# Patient Record
Sex: Female | Born: 1940
Health system: Southern US, Community
[De-identification: ages and names within clinical notes are randomized; demographics above are authoritative.]

## PROBLEM LIST (undated history)

## (undated) ENCOUNTER — Emergency Department (HOSPITAL_COMMUNITY): Admission: EM | Payer: Medicare PPO

## (undated) DIAGNOSIS — H9190 Unspecified hearing loss, unspecified ear: Secondary | ICD-10-CM

## (undated) DIAGNOSIS — E785 Hyperlipidemia, unspecified: Secondary | ICD-10-CM

## (undated) DIAGNOSIS — I10 Essential (primary) hypertension: Secondary | ICD-10-CM

## (undated) DIAGNOSIS — R011 Cardiac murmur, unspecified: Secondary | ICD-10-CM

## (undated) DIAGNOSIS — H269 Unspecified cataract: Secondary | ICD-10-CM

## (undated) DIAGNOSIS — I480 Paroxysmal atrial fibrillation: Secondary | ICD-10-CM

## (undated) DIAGNOSIS — F419 Anxiety disorder, unspecified: Secondary | ICD-10-CM

## (undated) DIAGNOSIS — I499 Cardiac arrhythmia, unspecified: Secondary | ICD-10-CM

## (undated) DIAGNOSIS — G4730 Sleep apnea, unspecified: Secondary | ICD-10-CM

## (undated) DIAGNOSIS — M1990 Unspecified osteoarthritis, unspecified site: Secondary | ICD-10-CM

## (undated) DIAGNOSIS — I509 Heart failure, unspecified: Secondary | ICD-10-CM

## (undated) HISTORY — DX: Hyperlipidemia, unspecified: E78.5

## (undated) HISTORY — DX: Essential (primary) hypertension: I10

## (undated) HISTORY — PX: BREAST SURGERY: SHX581

## (undated) HISTORY — DX: Heart failure, unspecified: I50.9

## (undated) HISTORY — PX: DILATION AND CURETTAGE OF UTERUS: SHX78

## (undated) HISTORY — DX: Unspecified cataract: H26.9

## (undated) HISTORY — PX: TUBAL LIGATION: SHX77

## (undated) HISTORY — PX: ABDOMINAL HYSTERECTOMY: SHX81

## (undated) HISTORY — PX: NECK SURGERY: SHX720

## (undated) HISTORY — DX: Sleep apnea, unspecified: G47.30

## (undated) HISTORY — PX: TONSILLECTOMY: SUR1361

## (undated) HISTORY — PX: SPINE SURGERY: SHX786

## (undated) HISTORY — PX: BREAST CYST EXCISION: SHX579

---

## 1998-07-28 ENCOUNTER — Encounter: Payer: Self-pay | Admitting: Obstetrics and Gynecology

## 1998-08-08 ENCOUNTER — Inpatient Hospital Stay (HOSPITAL_COMMUNITY): Admission: RE | Admit: 1998-08-08 | Discharge: 1998-08-10 | Payer: Self-pay | Admitting: Obstetrics and Gynecology

## 1999-09-14 ENCOUNTER — Encounter: Payer: Self-pay | Admitting: Neurosurgery

## 1999-09-14 ENCOUNTER — Ambulatory Visit (HOSPITAL_COMMUNITY): Admission: RE | Admit: 1999-09-14 | Discharge: 1999-09-14 | Payer: Self-pay | Admitting: Neurosurgery

## 1999-10-24 ENCOUNTER — Encounter: Payer: Self-pay | Admitting: Neurosurgery

## 1999-10-25 ENCOUNTER — Encounter: Payer: Self-pay | Admitting: Neurosurgery

## 1999-10-25 ENCOUNTER — Ambulatory Visit (HOSPITAL_COMMUNITY): Admission: RE | Admit: 1999-10-25 | Discharge: 1999-10-25 | Payer: Self-pay | Admitting: Neurosurgery

## 1999-12-17 ENCOUNTER — Ambulatory Visit (HOSPITAL_COMMUNITY): Admission: RE | Admit: 1999-12-17 | Discharge: 1999-12-17 | Payer: Self-pay | Admitting: Neurosurgery

## 1999-12-17 ENCOUNTER — Encounter: Payer: Self-pay | Admitting: Neurosurgery

## 1999-12-24 ENCOUNTER — Encounter: Payer: Self-pay | Admitting: Gastroenterology

## 1999-12-24 ENCOUNTER — Encounter: Admission: RE | Admit: 1999-12-24 | Discharge: 1999-12-24 | Payer: Self-pay | Admitting: Gastroenterology

## 2000-12-25 ENCOUNTER — Encounter: Payer: Self-pay | Admitting: Obstetrics and Gynecology

## 2000-12-25 ENCOUNTER — Encounter: Admission: RE | Admit: 2000-12-25 | Discharge: 2000-12-25 | Payer: Self-pay | Admitting: Obstetrics and Gynecology

## 2002-01-05 ENCOUNTER — Encounter: Admission: RE | Admit: 2002-01-05 | Discharge: 2002-01-05 | Payer: Self-pay | Admitting: Obstetrics and Gynecology

## 2002-01-05 ENCOUNTER — Encounter: Payer: Self-pay | Admitting: Obstetrics and Gynecology

## 2002-01-07 ENCOUNTER — Encounter: Payer: Self-pay | Admitting: Obstetrics and Gynecology

## 2002-01-07 ENCOUNTER — Encounter: Admission: RE | Admit: 2002-01-07 | Discharge: 2002-01-07 | Payer: Self-pay | Admitting: Obstetrics and Gynecology

## 2003-01-11 ENCOUNTER — Encounter: Admission: RE | Admit: 2003-01-11 | Discharge: 2003-01-11 | Payer: Self-pay | Admitting: Obstetrics and Gynecology

## 2003-01-11 ENCOUNTER — Encounter: Payer: Self-pay | Admitting: Obstetrics and Gynecology

## 2004-01-30 ENCOUNTER — Encounter: Admission: RE | Admit: 2004-01-30 | Discharge: 2004-01-30 | Payer: Self-pay | Admitting: Internal Medicine

## 2005-02-06 ENCOUNTER — Encounter: Admission: RE | Admit: 2005-02-06 | Discharge: 2005-02-06 | Payer: Self-pay | Admitting: Internal Medicine

## 2005-03-29 ENCOUNTER — Encounter: Admission: RE | Admit: 2005-03-29 | Discharge: 2005-03-29 | Payer: Self-pay | Admitting: Internal Medicine

## 2005-12-31 ENCOUNTER — Encounter: Admission: RE | Admit: 2005-12-31 | Discharge: 2005-12-31 | Payer: Self-pay | Admitting: Neurosurgery

## 2006-02-20 ENCOUNTER — Encounter: Admission: RE | Admit: 2006-02-20 | Discharge: 2006-02-20 | Payer: Self-pay | Admitting: Internal Medicine

## 2007-02-24 ENCOUNTER — Encounter: Admission: RE | Admit: 2007-02-24 | Discharge: 2007-02-24 | Payer: Self-pay | Admitting: Internal Medicine

## 2008-03-18 ENCOUNTER — Encounter: Admission: RE | Admit: 2008-03-18 | Discharge: 2008-03-18 | Payer: Self-pay | Admitting: Internal Medicine

## 2009-03-21 ENCOUNTER — Encounter: Admission: RE | Admit: 2009-03-21 | Discharge: 2009-03-21 | Payer: Self-pay | Admitting: Internal Medicine

## 2010-03-22 ENCOUNTER — Encounter: Admission: RE | Admit: 2010-03-22 | Discharge: 2010-03-22 | Payer: Self-pay | Admitting: Internal Medicine

## 2011-04-10 ENCOUNTER — Other Ambulatory Visit: Payer: Self-pay | Admitting: Internal Medicine

## 2011-04-10 DIAGNOSIS — Z1231 Encounter for screening mammogram for malignant neoplasm of breast: Secondary | ICD-10-CM

## 2011-05-03 ENCOUNTER — Ambulatory Visit
Admission: RE | Admit: 2011-05-03 | Discharge: 2011-05-03 | Disposition: A | Discharge status: Home / Self Care | Payer: Medicare Other | Source: Ambulatory Visit | Attending: Internal Medicine | Admitting: Internal Medicine

## 2011-05-03 DIAGNOSIS — Z1231 Encounter for screening mammogram for malignant neoplasm of breast: Secondary | ICD-10-CM

## 2012-05-05 ENCOUNTER — Other Ambulatory Visit: Payer: Self-pay | Admitting: Internal Medicine

## 2012-05-05 DIAGNOSIS — Z1231 Encounter for screening mammogram for malignant neoplasm of breast: Secondary | ICD-10-CM

## 2012-05-29 ENCOUNTER — Ambulatory Visit
Admission: RE | Admit: 2012-05-29 | Discharge: 2012-05-29 | Disposition: A | Discharge status: Home / Self Care | Payer: Medicare Other | Source: Ambulatory Visit | Attending: Internal Medicine | Admitting: Internal Medicine

## 2012-05-29 DIAGNOSIS — Z1231 Encounter for screening mammogram for malignant neoplasm of breast: Secondary | ICD-10-CM

## 2012-06-04 ENCOUNTER — Other Ambulatory Visit: Payer: Self-pay | Admitting: Internal Medicine

## 2012-06-04 DIAGNOSIS — R928 Other abnormal and inconclusive findings on diagnostic imaging of breast: Secondary | ICD-10-CM

## 2012-06-05 ENCOUNTER — Ambulatory Visit
Admission: RE | Admit: 2012-06-05 | Discharge: 2012-06-05 | Disposition: A | Discharge status: Home / Self Care | Payer: Medicare Other | Source: Ambulatory Visit | Attending: Internal Medicine | Admitting: Internal Medicine

## 2012-06-05 ENCOUNTER — Other Ambulatory Visit: Payer: Self-pay | Admitting: Internal Medicine

## 2012-06-05 DIAGNOSIS — R928 Other abnormal and inconclusive findings on diagnostic imaging of breast: Secondary | ICD-10-CM

## 2012-06-09 ENCOUNTER — Ambulatory Visit
Admission: RE | Admit: 2012-06-09 | Discharge: 2012-06-09 | Disposition: A | Discharge status: Home / Self Care | Payer: Medicare Other | Source: Ambulatory Visit | Attending: Internal Medicine | Admitting: Internal Medicine

## 2012-06-09 DIAGNOSIS — R928 Other abnormal and inconclusive findings on diagnostic imaging of breast: Secondary | ICD-10-CM

## 2013-05-07 ENCOUNTER — Other Ambulatory Visit: Payer: Self-pay | Admitting: Internal Medicine

## 2013-05-07 DIAGNOSIS — N644 Mastodynia: Secondary | ICD-10-CM

## 2013-05-13 ENCOUNTER — Ambulatory Visit
Admission: RE | Admit: 2013-05-13 | Discharge: 2013-05-13 | Disposition: A | Discharge status: Home / Self Care | Payer: Medicare Other | Source: Ambulatory Visit | Attending: Internal Medicine | Admitting: Internal Medicine

## 2013-05-13 DIAGNOSIS — N644 Mastodynia: Secondary | ICD-10-CM

## 2014-06-02 ENCOUNTER — Other Ambulatory Visit: Payer: Self-pay

## 2014-06-02 DIAGNOSIS — Z1231 Encounter for screening mammogram for malignant neoplasm of breast: Secondary | ICD-10-CM

## 2014-06-15 ENCOUNTER — Ambulatory Visit
Admission: RE | Admit: 2014-06-15 | Discharge: 2014-06-15 | Disposition: A | Discharge status: Home / Self Care | Payer: 59 | Source: Ambulatory Visit

## 2014-06-15 DIAGNOSIS — Z1231 Encounter for screening mammogram for malignant neoplasm of breast: Secondary | ICD-10-CM

## 2015-05-17 ENCOUNTER — Other Ambulatory Visit: Payer: Self-pay

## 2015-05-17 DIAGNOSIS — Z1231 Encounter for screening mammogram for malignant neoplasm of breast: Secondary | ICD-10-CM

## 2015-06-23 ENCOUNTER — Inpatient Hospital Stay (HOSPITAL_COMMUNITY)
Admission: EM | Admit: 2015-06-23 | Discharge: 2015-06-24 | DRG: 310 | Disposition: A | Discharge status: Home / Self Care | Payer: Medicare Other | Attending: Cardiovascular Disease | Admitting: Cardiovascular Disease

## 2015-06-23 ENCOUNTER — Emergency Department (HOSPITAL_COMMUNITY): Payer: Medicare Other

## 2015-06-23 ENCOUNTER — Encounter (HOSPITAL_COMMUNITY): Payer: Self-pay | Admitting: Physician Assistant

## 2015-06-23 DIAGNOSIS — H9190 Unspecified hearing loss, unspecified ear: Secondary | ICD-10-CM

## 2015-06-23 DIAGNOSIS — I209 Angina pectoris, unspecified: Secondary | ICD-10-CM | POA: Diagnosis not present

## 2015-06-23 DIAGNOSIS — R072 Precordial pain: Secondary | ICD-10-CM | POA: Diagnosis present

## 2015-06-23 DIAGNOSIS — Z79899 Other long term (current) drug therapy: Secondary | ICD-10-CM | POA: Diagnosis not present

## 2015-06-23 DIAGNOSIS — I480 Paroxysmal atrial fibrillation: Secondary | ICD-10-CM | POA: Diagnosis present

## 2015-06-23 DIAGNOSIS — I4891 Unspecified atrial fibrillation: Secondary | ICD-10-CM

## 2015-06-23 DIAGNOSIS — Z23 Encounter for immunization: Secondary | ICD-10-CM

## 2015-06-23 DIAGNOSIS — Z8249 Family history of ischemic heart disease and other diseases of the circulatory system: Secondary | ICD-10-CM

## 2015-06-23 DIAGNOSIS — R079 Chest pain, unspecified: Secondary | ICD-10-CM | POA: Diagnosis not present

## 2015-06-23 DIAGNOSIS — N189 Chronic kidney disease, unspecified: Secondary | ICD-10-CM | POA: Diagnosis present

## 2015-06-23 DIAGNOSIS — Z880 Allergy status to penicillin: Secondary | ICD-10-CM | POA: Diagnosis not present

## 2015-06-23 DIAGNOSIS — Z885 Allergy status to narcotic agent status: Secondary | ICD-10-CM | POA: Diagnosis not present

## 2015-06-23 DIAGNOSIS — I10 Essential (primary) hypertension: Secondary | ICD-10-CM | POA: Diagnosis present

## 2015-06-23 DIAGNOSIS — Z823 Family history of stroke: Secondary | ICD-10-CM | POA: Diagnosis not present

## 2015-06-23 DIAGNOSIS — E785 Hyperlipidemia, unspecified: Secondary | ICD-10-CM | POA: Diagnosis present

## 2015-06-23 DIAGNOSIS — E876 Hypokalemia: Secondary | ICD-10-CM | POA: Diagnosis present

## 2015-06-23 DIAGNOSIS — I129 Hypertensive chronic kidney disease with stage 1 through stage 4 chronic kidney disease, or unspecified chronic kidney disease: Secondary | ICD-10-CM | POA: Diagnosis present

## 2015-06-23 HISTORY — DX: Unspecified hearing loss, unspecified ear: H91.90

## 2015-06-23 HISTORY — DX: Essential (primary) hypertension: I10

## 2015-06-23 HISTORY — DX: Cardiac murmur, unspecified: R01.1

## 2015-06-23 HISTORY — DX: Hyperlipidemia, unspecified: E78.5

## 2015-06-23 HISTORY — DX: Cardiac arrhythmia, unspecified: I49.9

## 2015-06-23 HISTORY — DX: Paroxysmal atrial fibrillation: I48.0

## 2015-06-23 HISTORY — DX: Unspecified osteoarthritis, unspecified site: M19.90

## 2015-06-23 LAB — BASIC METABOLIC PANEL
ANION GAP: 11 (ref 5–15)
BUN: 17 mg/dL (ref 6–20)
CALCIUM: 8.9 mg/dL (ref 8.9–10.3)
CO2: 25 mmol/L (ref 22–32)
Chloride: 104 mmol/L (ref 101–111)
Creatinine, Ser: 0.78 mg/dL (ref 0.44–1.00)
GFR calc Af Amer: >60 mL/min (ref >60)
Glucose, Bld: 141 mg/dL — ABNORMAL HIGH (ref 65–99)
POTASSIUM: 3.3 mmol/L — AB (ref 3.5–5.1)
SODIUM: 140 mmol/L (ref 135–145)

## 2015-06-23 LAB — TROPONIN I: Troponin I: <0.03 ng/mL (ref <0.031)

## 2015-06-23 LAB — CBC
HEMATOCRIT: 42.2 % (ref 36.0–46.0)
Hemoglobin: 14.4 g/dL (ref 12.0–15.0)
MCH: 30.1 pg (ref 26.0–34.0)
MCHC: 34.1 g/dL (ref 30.0–36.0)
MCV: 88.3 fL (ref 78.0–100.0)
Platelets: 266 10*3/uL (ref 150–400)
RBC: 4.78 MIL/uL (ref 3.87–5.11)
RDW: 13.6 % (ref 11.5–15.5)
WBC: 8.2 10*3/uL (ref 4.0–10.5)

## 2015-06-23 LAB — PROTIME-INR
INR: 1.15 (ref 0.00–1.49)
INR: 1.18 (ref 0.00–1.49)
Prothrombin Time: 14.9 seconds (ref 11.6–15.2)
Prothrombin Time: 15.1 seconds (ref 11.6–15.2)

## 2015-06-23 LAB — T4, FREE: Free T4: 1.04 ng/dL (ref 0.61–1.12)

## 2015-06-23 LAB — TSH: TSH: 1.045 u[IU]/mL (ref 0.350–4.500)

## 2015-06-23 LAB — APTT: aPTT: 29 seconds (ref 24–37)

## 2015-06-23 LAB — MAGNESIUM: MAGNESIUM: 2 mg/dL (ref 1.7–2.4)

## 2015-06-23 MED ORDER — INFLUENZA VAC SPLIT QUAD 0.5 ML IM SUSY
0.5000 mL | PREFILLED_SYRINGE | INTRAMUSCULAR | Status: AC
  Administered 2015-06-24: 0.5 mL via INTRAMUSCULAR
  Filled 2015-06-23: qty 0.5

## 2015-06-23 MED ORDER — ONDANSETRON HCL 4 MG/2ML IJ SOLN: 4.0000 mg | Freq: Three times a day (TID) | INTRAMUSCULAR | Status: AC | PRN

## 2015-06-23 MED ORDER — SODIUM CHLORIDE 0.9 % IV SOLN
Freq: Once | INTRAVENOUS | Status: AC
  Administered 2015-06-23: 10 mL/h via INTRAVENOUS

## 2015-06-23 MED ORDER — SODIUM CHLORIDE 0.9 % IV SOLN: 250.0000 mL | INTRAVENOUS | Status: DC | PRN

## 2015-06-23 MED ORDER — ASPIRIN EC 81 MG PO TBEC
81.0000 mg | DELAYED_RELEASE_TABLET | Freq: Every day | ORAL | Status: DC
  Administered 2015-06-24: 81 mg via ORAL
  Filled 2015-06-23: qty 1

## 2015-06-23 MED ORDER — METOPROLOL TARTRATE 12.5 MG HALF TABLET
12.5000 mg | ORAL_TABLET | Freq: Four times a day (QID) | ORAL | Status: DC
  Administered 2015-06-23 (×2): 12.5 mg via ORAL
  Filled 2015-06-23 (×2): qty 1

## 2015-06-23 MED ORDER — METOPROLOL TARTRATE 1 MG/ML IV SOLN: 2.5000 mg | INTRAVENOUS | Status: DC | PRN

## 2015-06-23 MED ORDER — POLYVINYL ALCOHOL 1.4 % OP SOLN
1.0000 [drp] | Freq: Every day | OPHTHALMIC | Status: DC
  Administered 2015-06-23: 1 [drp] via OPHTHALMIC
  Filled 2015-06-23: qty 15

## 2015-06-23 MED ORDER — POTASSIUM CHLORIDE CRYS ER 20 MEQ PO TBCR
20.0000 meq | EXTENDED_RELEASE_TABLET | Freq: Once | ORAL | Status: AC
  Administered 2015-06-23: 20 meq via ORAL
  Filled 2015-06-23: qty 1

## 2015-06-23 MED ORDER — PROPYLENE GLYCOL 0.6 % OP SOLN: 1.0000 [drp] | Freq: Every day | OPHTHALMIC | Status: DC

## 2015-06-23 MED ORDER — ACETAMINOPHEN 325 MG PO TABS: 650.0000 mg | ORAL_TABLET | ORAL | Status: DC | PRN

## 2015-06-23 MED ORDER — ATORVASTATIN CALCIUM 20 MG PO TABS
20.0000 mg | ORAL_TABLET | Freq: Every day | ORAL | Status: DC
  Administered 2015-06-23 – 2015-06-24 (×2): 20 mg via ORAL
  Filled 2015-06-23 (×2): qty 1

## 2015-06-23 MED ORDER — NITROGLYCERIN 0.4 MG SL SUBL: 0.4000 mg | SUBLINGUAL_TABLET | SUBLINGUAL | Status: DC | PRN

## 2015-06-23 MED ORDER — DILTIAZEM HCL 100 MG IV SOLR
5.0000 mg/h | INTRAVENOUS | Status: DC
  Administered 2015-06-23: 17.5 mg/h via INTRAVENOUS
  Administered 2015-06-23: 12.5 mg/h via INTRAVENOUS
  Administered 2015-06-23: 15 mg/h via INTRAVENOUS
  Administered 2015-06-23: 5 mg/h via INTRAVENOUS
  Administered 2015-06-23: 10 mg/h via INTRAVENOUS
  Administered 2015-06-24: 15 mg/h via INTRAVENOUS
  Administered 2015-06-24: 5 mg/h via INTRAVENOUS
  Filled 2015-06-23 (×4): qty 100

## 2015-06-23 MED ORDER — DILTIAZEM LOAD VIA INFUSION
20.0000 mg | Freq: Once | INTRAVENOUS | Status: AC
  Administered 2015-06-23: 20 mg via INTRAVENOUS
  Filled 2015-06-23: qty 20

## 2015-06-23 MED ORDER — ASPIRIN EC 81 MG PO TBEC: 81.0000 mg | DELAYED_RELEASE_TABLET | Freq: Every day | ORAL | Status: DC

## 2015-06-23 MED ORDER — HEPARIN BOLUS VIA INFUSION
4000.0000 [IU] | Freq: Once | INTRAVENOUS | Status: AC
  Administered 2015-06-23: 4000 [IU] via INTRAVENOUS
  Filled 2015-06-23: qty 4000

## 2015-06-23 MED ORDER — SODIUM CHLORIDE 0.9 % IJ SOLN: 3.0000 mL | INTRAMUSCULAR | Status: DC | PRN

## 2015-06-23 MED ORDER — SODIUM CHLORIDE 0.9 % IJ SOLN
3.0000 mL | Freq: Two times a day (BID) | INTRAMUSCULAR | Status: DC
  Administered 2015-06-23: 3 mL via INTRAVENOUS

## 2015-06-23 MED ORDER — SODIUM CHLORIDE 0.9 % IJ SOLN: 80.0000 mg | INTRAVENOUS | Status: DC

## 2015-06-23 MED ORDER — ZOLPIDEM TARTRATE 5 MG PO TABS: 5.0000 mg | ORAL_TABLET | Freq: Every evening | ORAL | Status: DC | PRN

## 2015-06-23 MED ORDER — RIVAROXABAN 20 MG PO TABS
20.0000 mg | ORAL_TABLET | Freq: Every day | ORAL | Status: DC
  Filled 2015-06-23: qty 1

## 2015-06-23 MED ORDER — MULTI-VITAMIN/MINERALS PO TABS
1.0000 | ORAL_TABLET | Freq: Every day | ORAL | Status: DC
  Administered 2015-06-23: 1 via ORAL
  Filled 2015-06-23 (×2): qty 1

## 2015-06-23 MED ORDER — ONDANSETRON HCL 4 MG/2ML IJ SOLN: 4.0000 mg | Freq: Four times a day (QID) | INTRAMUSCULAR | Status: DC | PRN

## 2015-06-23 MED ORDER — APIXABAN 5 MG PO TABS
5.0000 mg | ORAL_TABLET | Freq: Two times a day (BID) | ORAL | Status: DC
  Administered 2015-06-23 – 2015-06-24 (×2): 5 mg via ORAL
  Filled 2015-06-23 (×3): qty 1

## 2015-06-23 MED ORDER — POTASSIUM CHLORIDE CRYS ER 20 MEQ PO TBCR
40.0000 meq | EXTENDED_RELEASE_TABLET | ORAL | Status: AC
  Administered 2015-06-23 (×2): 40 meq via ORAL
  Filled 2015-06-23 (×2): qty 2

## 2015-06-23 MED ORDER — HEPARIN (PORCINE) IN NACL 100-0.45 UNIT/ML-% IJ SOLN
1100.0000 [IU]/h | INTRAMUSCULAR | Status: AC
  Administered 2015-06-23: 1100 [IU]/h via INTRAVENOUS
  Filled 2015-06-23: qty 250

## 2015-06-23 MED ORDER — ALPRAZOLAM 0.25 MG PO TABS: 0.2500 mg | ORAL_TABLET | Freq: Two times a day (BID) | ORAL | Status: DC | PRN

## 2015-06-23 NOTE — H&P (Signed)
History and Physical   Patient ID: Shannon Liu MRN: [id number], DOB/AGE: [birth year] 74 y.o. Date of Encounter: 06/23/2015  Primary Physician: Irven Shelling, MD Primary Cardiologist: New  Chief Complaint:  Palpitations  HPI: Shannon Liu is a 74 y.o. female with a history of HTN, HL, but no cardiac issues.  Last pm, she developed nausea. Overnight, she developed burning substernal chest pain, mild. Also with cramping pain under her L breast. She urinated frequently but did not have any dysuria.   This am, she still felt bad and could feel her heart racing. Her BP machine did not read at first but then worked and her HR was 137. She came to the ER where she was in atrial fib, RVR.   She first noticed her heart rate was higher than normal 4 days ago. Her heart rate at the gym was 105 before exercising, unusual. She was able to exercise twice this week without chest pain, SOB different from usual, presyncope or syncope. She did not have any palpitations at that time.   Currently she is on Cardizem IV at 12.5 mg/hr and is asymptomatic with a HR in the 120s.   Past Medical History  Diagnosis Date  . HTN (hypertension)     since pregnant at age 18  . Hyperlipemia   . HOH (hard of hearing)     Surgical History:  Past Surgical History  Procedure Laterality Date  . Neck surgery      discectomy with cadaver bone  . Abdominal hysterectomy       I have reviewed the patient's current medications. Prior to Admission medications   Medication Sig Start Date End Date Taking? Authorizing Provider  ALPRAZolam (XANAX) 0.25 MG tablet Take 0.25 mg by mouth 2 (two) times daily as needed for anxiety.   Yes Historical Provider, MD  amLODipine (NORVASC) 10 MG tablet Take 10 mg by mouth daily.   Yes Historical Provider, MD  atorvastatin (LIPITOR) 20 MG tablet Take 20 mg by mouth daily.   Yes Historical Provider, MD  valsartan-hydrochlorothiazide (DIOVAN-HCT) 320-25 MG per tablet Take  1 tablet by mouth daily.   Yes Historical Provider, MD    Scheduled Meds: . potassium chloride  40 mEq Oral Q4H   Continuous Infusions: . diltiazem (CARDIZEM) infusion 12.5 mg/hr (06/23/15 1219)  . heparin 1,100 Units/hr (06/23/15 1200)   PRN Meds:.  Allergies:  Allergies  Allergen Reactions  . Morphine And Related Nausea And Vomiting  . Penicillins Nausea And Vomiting    Social History   Social History  . Marital Status: Widowed    Spouse Name: N/A  . Number of Children: N/A  . Years of Education: N/A   Occupational History  . Retired    Social History Main Topics  . Smoking status: Never Smoker   . Smokeless tobacco: Never Used  . Alcohol Use: No  . Drug Use: No  . Sexual Activity: Not on file   Other Topics Concern  . Not on file   [place], near [place]. Daughter currently lives with her.    Family History  Problem Relation Age of Onset  . Heart attack Mother 66  . Hypertension Sister   . Hypertension Brother    Family Status  Relation Status Death Age  . Mother Deceased 45    CVA  . Father Deceased 55    Died in his sleep    Review of Systems:  Full 14-point review of systems otherwise negative except as noted above.  Physical Exam: Blood pressure 126/83, pulse 43, temperature 99 F (37.2 C), temperature source Oral, resp. rate 18, height 5' 7.5" (1.715 m), weight 200 lb (90.719 kg), SpO2 96 %. General: Well developed, well nourished,female in no acute distress. Head: Normocephalic, atraumatic, sclera non-icteric, no xanthomas, nares are without discharge. Dentition: good Neck: No carotid bruits. JVD not elevated. No thyromegally Lungs: Good expansion bilaterally. without wheezes or rhonchi.  Heart: IRRegular rate and rhythm with S1 S2.  No S3 or S4.  No murmur, no rubs, or gallops appreciated. Abdomen: Soft, non-tender, non-distended with normoactive bowel sounds. No hepatomegaly. No  rebound/guarding. No obvious abdominal masses. Msk:  Strength and tone appear normal for age. No joint deformities or effusions, no spine or costo-vertebral angle tenderness. Extremities: No clubbing or cyanosis. No edema.  Distal pedal pulses are 2+ in 4 extrem Neuro: Alert and oriented X 3. Moves all extremities spontaneously. No focal deficits noted. Psych:  Responds to questions appropriately with a normal affect. Skin: No rashes or lesions noted  Labs:   Lab Results  Component Value Date   WBC 8.2 06/23/2015   HGB 14.4 06/23/2015   HCT 42.2 06/23/2015   MCV 88.3 06/23/2015   PLT 266 06/23/2015    Recent Labs  06/23/15 1000  INR 1.15     Recent Labs Lab 06/23/15 1000  NA 140  K 3.3*  CL 104  CO2 25  BUN 17  CREATININE 0.78  CALCIUM 8.9  GLUCOSE 141*    Recent Labs  06/23/15 1000  TROPONINI <0.03   Radiology/Studies: Dg Chest Port 1 View  06/23/2015   CLINICAL DATA:  Atrial fibrillation.  EXAM: PORTABLE CHEST - 1 VIEW  COMPARISON:  11/21/2011  FINDINGS: Heart size is normal. Atherosclerotic calcifications at the aortic arch. Trachea is midline. Both lungs are clear without airspace disease or pulmonary edema. No acute bone abnormality.  IMPRESSION: No acute chest findings.   Electronically Signed   By: Markus Daft M.D.   On: 06/23/2015 11:01   ECG: Atrial fib, RVR  ASSESSMENT AND PLAN:  Principal Problem:   Atrial fibrillation with rapid ventricular response - add IV Lopressor for additional rate control. - pt stable, OK to change to PO meds once effective dose determined - explained the need for anticoagulation, pt wishes to avoid CVA - has insurance for medications - will change heparin to Xarelto and ask CM to do a benefits check.  Active Problems:   HTN (hypertension) - hold home rx for now, until we figure out her cardiac meds    Hypokalemia - supplemented by ER MD - follow, pt will quit drinking vinegar - daily supp if she is discharged on her  HCTZ   Signed, Rosaria Ferries, PA-C 06/23/2015 1:03 PM Beeper [phone]    Agree with note by Rosaria Ferries PA-C  Pt with new onset PAF with RVR and CP. Only CRF is HTN. Fast irreg HR X 3-4 days. Pt is active for her age and exercises regularly. EKG with diffuse ST depression. Currently pain free. Exam benign. Labs OK except for low K---->> re[lete. On IV dilt and B B. IV hep. Will start Eliquis and plan TEE DCCV Monday. Will need OP MV.   Lorretta Harp, M.D., Rosalita Chessman [facility], FAHA, [place] [address] [place], [place]  [address]  [phone] 06/23/2015 1:36 PM

## 2015-06-23 NOTE — ED Notes (Signed)
Cardiologist at bedside.  

## 2015-06-23 NOTE — Progress Notes (Addendum)
ANTICOAGULATION CONSULT NOTE - Initial Consult  Pharmacy Consult for heparin Indication: atrial fibrillation  No Known Allergies  Patient Measurements: Height: 5' 7.5" (171.5 cm) (patient reported) Weight: 200 lb (90.719 kg) (patient reported) IBW/kg (Calculated) : 62.75 Heparin Dosing Weight: 82kg  Vital Signs: Temp: 99 F (37.2 C) (09/09 0949) Temp Source: Oral (09/09 0949) BP: 136/75 mmHg (09/09 0949) Pulse Rate: 164 (09/09 0949)  Labs:  Recent Labs  06/23/15 1000  HGB 14.4  HCT 42.2  PLT 266  APTT 29  LABPROT 14.9  INR 1.15  CREATININE 0.78  TROPONINI <0.03    Estimated Creatinine Clearance: 73.2 mL/min (by C-G formula based on Cr of 0.78).   Medical History: No past medical history on file.  Medications:  Infusions:  . diltiazem (CARDIZEM) infusion 7.5 mg/hr (06/23/15 1113)  . heparin      Assessment: 59 yof presented to the ED with CP and palpitations. To start IV heparin for anticoagulation for afib. Baseline CBC and INR are WNL. She is not on any anticoagulation PTA.   Goal of Therapy:  Heparin level 0.3-0.7 units/ml Monitor platelets by anticoagulation protocol: Yes   Plan:  - Heparin bolus 4000 units IV x 1 - Heparin gtt 1100 units/hr - Check an 8 hour heparin level - Daily heparin level and CBC  Janeane Cozart, Rande Lawman 06/23/2015,11:43 AM  Addendum: Now transitioning to xarelto. Will continue heparin for now until xarelto dose tonight with dinner.   Plan: - Xarelto 20mg  PO daily - Monitor renal fxn and S&S of bleeding  Salome Arnt, PharmD, BCPS Pager # [phone] 06/23/2015 1:17 PM  Addendum: Now changing to apixaban.  Plan: - Apixaban 5mg  PO BID - Monitor renal fxn and S&S of bleeding  Salome Arnt, PharmD, BCPS Pager # [phone] 06/23/2015 1:28 PM

## 2015-06-23 NOTE — ED Notes (Signed)
Called EMS chest discomfort 0300 felt palpitation felt better went back to sleep and 0715 took xanax and at 0845 took 324mg  aspirin did have nausea only. Alert answering and following commands appropriate.

## 2015-06-23 NOTE — ED Notes (Signed)
Spoke with Dr Sabra Heck ordered patient to received total 60 meq of potassium.

## 2015-06-23 NOTE — ED Provider Notes (Signed)
CSN: [id number]     Arrival date & time 06/23/15  2353 History   First MD Initiated Contact with Patient 06/23/15 [phone]     Chief Complaint  Patient presents with  . Chest Pain     (Consider location/radiation/quality/duration/timing/severity/associated sxs/prior Treatment) HPI Comments: The patient is a pleasant 74 year old female, no history of cardiac disease who presents with a complaint of palpitations. She reports that her heart rate is usually between 60 and 80, earlier this week she was on the treadmill at the gym and before she started exercising her heart rate was already over 100. She did not feel particularly bad however last night she started to feel palpitations, epigastric and left-sided chest "twinges". She denies fevers chills nausea vomiting diarrhea or swelling of the legs  She denies any new medications, denies any over-the-counter medications, denies smoking alcohol or any drug use.  Patient is a 73 y.o. female presenting with chest pain. The history is provided by the patient, the EMS personnel and a relative.  Chest Pain   No past medical history on file. No past surgical history on file. No family history on file. Social History  Substance Use Topics  . Smoking status: Not on file  . Smokeless tobacco: Not on file  . Alcohol Use: Not on file   OB History    No data available     Review of Systems  Cardiovascular: Positive for chest pain.  All other systems reviewed and are negative.     Allergies  Review of patient's allergies indicates no known allergies.  Home Medications   Prior to Admission medications   Not on File   BP 97/75 mmHg  Pulse 99  Temp(Src) 99 F (37.2 C) (Oral)  Resp 15  Ht 5' 7.5" (1.715 m)  Wt 200 lb (90.719 kg)  BMI 30.84 kg/m2  SpO2 95% Physical Exam  Constitutional: She appears well-developed and well-nourished. No distress.  HENT:  Head: Normocephalic and atraumatic.  Mouth/Throat: Oropharynx is clear and moist.  No oropharyngeal exudate.  Eyes: Conjunctivae and EOM are normal. Pupils are equal, round, and reactive to light. Right eye exhibits no discharge. Left eye exhibits no discharge. No scleral icterus.  Neck: Normal range of motion. Neck supple. No JVD present. No thyromegaly present.  Cardiovascular: Regular rhythm, normal heart sounds and intact distal pulses.  Exam reveals no gallop and no friction rub.   No murmur heard. Irregular heart rate, tachycardia, strong pulses, no JVD  Pulmonary/Chest: Effort normal and breath sounds normal. No respiratory distress. She has no wheezes. She has no rales.  Abdominal: Soft. Bowel sounds are normal. She exhibits no distension and no mass. There is no tenderness.  Musculoskeletal: Normal range of motion. She exhibits no edema or tenderness.  Lymphadenopathy:    She has no cervical adenopathy.  Neurological: She is alert. Coordination normal.  Skin: Skin is warm and dry. No rash noted. No erythema.  Psychiatric: She has a normal mood and affect. Her behavior is normal.  Nursing note and vitals reviewed.   ED Course  Procedures (including critical care time) Labs Review Labs Reviewed  BASIC METABOLIC PANEL - Abnormal; Notable for the following:    Potassium 3.3 (*)    Glucose, Bld 141 (*)    All other components within normal limits  APTT  PROTIME-INR  CBC  TROPONIN I  HEPARIN LEVEL (UNFRACTIONATED)  MAGNESIUM    Imaging Review Dg Chest Port 1 View  06/23/2015   CLINICAL DATA:  Atrial fibrillation.  EXAM: PORTABLE CHEST - 1 VIEW  COMPARISON:  11/21/2011  FINDINGS: Heart size is normal. Atherosclerotic calcifications at the aortic arch. Trachea is midline. Both lungs are clear without airspace disease or pulmonary edema. No acute bone abnormality.  IMPRESSION: No acute chest findings.   Electronically Signed   By: Markus Daft M.D.   On: 06/23/2015 11:01   I have personally reviewed and evaluated these images and lab results as part of my medical  decision-making.   EKG Interpretation   Date/Time:  Friday June 23 2015 09:46:20 EDT Ventricular Rate:  171 PR Interval:    QRS Duration: 77 QT Interval:  253 QTC Calculation: 427 R Axis:   40 Text Interpretation:  Atrial fibrillation with rapid V-rate Ventricular  premature complex Low voltage, precordial leads Repolarization  abnormality, prob rate related Since last tracing Atrial fibrillation  replaced NSR Confirmed by Ranetta Armacost  MD, Izzabell Klasen ([id number]) on 06/23/2015 10:00:56  AM      MDM   Final diagnoses:  Atrial fibrillation with RVR  Hypokalemia    Other than the patient's atrial fibrillation with rapid ventricular response her blood pressure is normal and she has no other acute findings. Her EKG is very abnormal with ST depressions, rapid rate, consistent with atrial fibrillation. The EKG findings are likely secondary to the rate, we'll attempt to slow with Cardizem, chest x-ray, labs, anticipate admission to the hospital. The patient is critically ill with severe rapid ventricular rate atrial fibrillation. She is requiring continuous monitoring on a cardiac monitor as well as continuous cardiac medications in the form of Cardizem drip which is titratable.  Discussed with cardiology at 11:40 AM, they will see the patient in consultation and requested hospitalist admission.  D/w Dr. Grandville Silos who will see pt in ED for admission  Step down bed requested  Multiple re-evaluations, heart rate remains high, improving slowly with Cardizem  CRITICAL CARE Performed by: Johnna Acosta Total critical care time: 35 Critical care time was exclusive of separately billable procedures and treating other patients. Critical care was necessary to treat or prevent imminent or life-threatening deterioration. Critical care was time spent personally by me on the following activities: development of treatment plan with patient and/or surrogate as well as nursing, discussions with consultants,  evaluation of patient's response to treatment, examination of patient, obtaining history from patient or surrogate, ordering and performing treatments and interventions, ordering and review of laboratory studies, ordering and review of radiographic studies, pulse oximetry and re-evaluation of patient's condition.     Noemi Chapel, MD 06/23/15 [phone]

## 2015-06-23 NOTE — ED Notes (Signed)
Patient undressed, in gown, on monitor, continuous pulse oximetry and blood pressure cuff; visitors at bedside 

## 2015-06-23 NOTE — ED Notes (Signed)
Cardiology PA at bedside. 

## 2015-06-24 ENCOUNTER — Inpatient Hospital Stay (HOSPITAL_COMMUNITY): Payer: Medicare Other

## 2015-06-24 ENCOUNTER — Encounter (HOSPITAL_COMMUNITY): Payer: Self-pay | Admitting: *Deleted

## 2015-06-24 DIAGNOSIS — E785 Hyperlipidemia, unspecified: Secondary | ICD-10-CM | POA: Diagnosis present

## 2015-06-24 DIAGNOSIS — R079 Chest pain, unspecified: Secondary | ICD-10-CM

## 2015-06-24 DIAGNOSIS — I480 Paroxysmal atrial fibrillation: Secondary | ICD-10-CM | POA: Diagnosis not present

## 2015-06-24 DIAGNOSIS — H9190 Unspecified hearing loss, unspecified ear: Secondary | ICD-10-CM

## 2015-06-24 LAB — COMPREHENSIVE METABOLIC PANEL
ALT: 19 U/L (ref 14–54)
AST: 26 U/L (ref 15–41)
Albumin: 3.2 g/dL — ABNORMAL LOW (ref 3.5–5.0)
Alkaline Phosphatase: 91 U/L (ref 38–126)
Anion gap: 8 (ref 5–15)
BILIRUBIN TOTAL: 0.7 mg/dL (ref 0.3–1.2)
BUN: 13 mg/dL (ref 6–20)
CALCIUM: 8.5 mg/dL — AB (ref 8.9–10.3)
CHLORIDE: 106 mmol/L (ref 101–111)
CO2: 24 mmol/L (ref 22–32)
CREATININE: 0.72 mg/dL (ref 0.44–1.00)
GFR calc non Af Amer: >60 mL/min (ref >60)
Glucose, Bld: 128 mg/dL — ABNORMAL HIGH (ref 65–99)
Potassium: 3.9 mmol/L (ref 3.5–5.1)
Sodium: 138 mmol/L (ref 135–145)
TOTAL PROTEIN: 5.4 g/dL — AB (ref 6.5–8.1)

## 2015-06-24 LAB — TROPONIN I

## 2015-06-24 LAB — CBC
HCT: 37.7 % (ref 36.0–46.0)
Hemoglobin: 12.6 g/dL (ref 12.0–15.0)
MCH: 29.6 pg (ref 26.0–34.0)
MCHC: 33.4 g/dL (ref 30.0–36.0)
MCV: 88.7 fL (ref 78.0–100.0)
PLATELETS: 235 10*3/uL (ref 150–400)
RBC: 4.25 MIL/uL (ref 3.87–5.11)
RDW: 13.8 % (ref 11.5–15.5)
WBC: 8.7 10*3/uL (ref 4.0–10.5)

## 2015-06-24 LAB — LIPID PANEL
Cholesterol: 142 mg/dL (ref 0–200)
HDL: 36 mg/dL — ABNORMAL LOW (ref >40)
LDL CALC: 82 mg/dL (ref 0–99)
Total CHOL/HDL Ratio: 3.9 RATIO
Triglycerides: 118 mg/dL (ref <150)
VLDL: 24 mg/dL (ref 0–40)

## 2015-06-24 MED ORDER — METOPROLOL TARTRATE 12.5 MG HALF TABLET: 12.5000 mg | ORAL_TABLET | Freq: Two times a day (BID) | ORAL | Status: DC

## 2015-06-24 MED ORDER — METOPROLOL TARTRATE 25 MG PO TABS: 12.5000 mg | ORAL_TABLET | Freq: Two times a day (BID) | ORAL | Status: DC

## 2015-06-24 MED ORDER — APIXABAN 5 MG PO TABS: 5.0000 mg | ORAL_TABLET | Freq: Two times a day (BID) | ORAL | Status: DC

## 2015-06-24 MED ORDER — VALSARTAN 160 MG PO TABS: 160.0000 mg | ORAL_TABLET | Freq: Every day | ORAL | Status: DC

## 2015-06-24 NOTE — Progress Notes (Signed)
  Echocardiogram 2D Echocardiogram has been performed.  Shannon Liu 06/24/2015, 11:52 AM

## 2015-06-24 NOTE — Progress Notes (Signed)
Patient Name: Shannon Liu Date of Encounter: 06/24/2015     Principal Problem:   Atrial fibrillation with rapid ventricular response Active Problems:   HTN (hypertension)   Atrial fibrillation    SUBJECTIVE  The patient feels well this morning.  She converted spontaneously back to sinus bradycardia this morning.  No chest pain or shortness of breath.  Echocardiogram has been done but not yet read.  CURRENT MEDS . apixaban  5 mg Oral BID  . aspirin EC  81 mg Oral Daily  . atorvastatin  20 mg Oral Daily  . metoprolol tartrate  12.5 mg Oral 4 times per day  . multivitamin with minerals  1 tablet Oral Daily  . polyvinyl alcohol  1 drop Both Eyes QHS  . sodium chloride  3 mL Intravenous Q12H    OBJECTIVE  Filed Vitals:   06/24/15 0530 06/24/15 0625 06/24/15 0652 06/24/15 0752  BP: 83/55   115/70  Pulse: 73 44 47 53  Temp: 97.8 F (36.6 C)   97.6 F (36.4 C)  TempSrc: Oral   Oral  Resp: 17 15 14 19   Height:      Weight: 203 lb 12.8 oz (92.443 kg)     SpO2: 95% 96% 96% 96%    Intake/Output Summary (Last 24 hours) at 06/24/15 1157 Last data filed at 06/24/15 0915  Gross per 24 hour  Intake  690.7 ml  Output   1750 ml  Net -1059.3 ml   Filed Weights   06/23/15 1100 06/24/15 0530  Weight: 200 lb (90.719 kg) 203 lb 12.8 oz (92.443 kg)    PHYSICAL EXAM  General: Pleasant, NAD. Neuro: Alert and oriented X 3. Moves all extremities spontaneously. Psych: Normal affect. HEENT:  Normal  Neck: Supple without bruits or JVD. Lungs:  Resp regular and unlabored, CTA. Heart: RRR no s3, s4, or murmurs. Abdomen: Soft, non-tender, non-distended, BS + x 4.  Extremities: No clubbing, cyanosis or edema. DP/PT/Radials 2+ and equal bilaterally.  Accessory Clinical Findings  CBC  Recent Labs  06/23/15 1000 06/24/15 0329  WBC 8.2 8.7  HGB 14.4 12.6  HCT 42.2 37.7  MCV 88.3 88.7  PLT 266 161   Basic Metabolic Panel  Recent Labs  06/23/15 1000 06/24/15 0329  NA  140 138  K 3.3* 3.9  CL 104 106  CO2 25 24  GLUCOSE 141* 128*  BUN 17 13  CREATININE 0.78 0.72  CALCIUM 8.9 8.5*  MG 2.0  --    Liver Function Tests  Recent Labs  06/24/15 0329  AST 26  ALT 19  ALKPHOS 91  BILITOT 0.7  PROT 5.4*  ALBUMIN 3.2*   No results for input(s): LIPASE, AMYLASE in the last 72 hours. Cardiac Enzymes  Recent Labs  06/23/15 2234 06/24/15 0329 06/24/15 0930  TROPONINI <0.03 <0.03 <0.03   BNP Invalid input(s): POCBNP D-Dimer No results for input(s): DDIMER in the last 72 hours. Hemoglobin A1C No results for input(s): HGBA1C in the last 72 hours. Fasting Lipid Panel  Recent Labs  06/24/15 0329  CHOL 142  HDL 36*  LDLCALC 82  TRIG 118  CHOLHDL 3.9   Thyroid Function Tests  Recent Labs  06/23/15 2234  TSH 1.045    TELE  She converted to normal sinus rhythm earlier this morning.  ECG  24-Jun-2015 06:31:42 [id number] ROUTINE RECORD Marked sinus bradycardia Low voltage QRS Personally reviewed Radiology/Studies  Dg Chest Port 1 View  06/23/2015   CLINICAL DATA:  Atrial fibrillation.  EXAM: PORTABLE CHEST - 1 VIEW  COMPARISON:  11/21/2011  FINDINGS: Heart size is normal. Atherosclerotic calcifications at the aortic arch. Trachea is midline. Both lungs are clear without airspace disease or pulmonary edema. No acute bone abnormality.  IMPRESSION: No acute chest findings.   Electronically Signed   By: Markus Daft M.D.   On: 06/23/2015 11:01    ASSESSMENT AND PLAN  1.  Paroxysmal atrial fibrillation,Chadsvasc score at least 2 for hypertension and female sex, now back in sinus bradycardia on beta blocker. 2.  Chest discomfort.  Stress test to be done as an outpatient per Dr. Kennon Holter note.  Troponins are negative 3 3.  History of hypertension.  Blood pressures are now low normal and to make room for beta blocker we will cut back on some of her other medications. 4. Hyperlipidemia  Plan: Anticipate she will be  able to go home later today after we have had a chance to look at her echocardiogram.  We will stop baby aspirin since she is also on Apixaban.  Because of low blood pressure we will not restart amlodipine at this point and in the place of valsartan-HCTZ  320-25 I would send her home on plain valsartan 160 mg daily.  She was hypokalemic on admission.  Titrate blood pressure further on an outpatient basis.  Follow-up with Dr. Quay Burow Signed, Darlin Coco MD

## 2015-06-24 NOTE — Progress Notes (Signed)
Patient converted to NSR and cardizem had been being titrated due to b/p decreasing, HR dropped to 40's and pt converted to SB, cardizem gtt stopped and lopressor held at this time .HR staying in 40's and 50's . EKG obtained. Dr. Candyce Churn paged and made aware of above. Patient has no s/s of any acute distress. [facility] [employer]

## 2015-06-24 NOTE — Discharge Instructions (Addendum)
Atrial Fibrillation °Atrial fibrillation is a type of irregular heart rhythm (arrhythmia). During atrial fibrillation, the upper chambers of the heart (atria) quiver continuously in a chaotic pattern. This causes an irregular and often rapid heart rate.  °Atrial fibrillation is the result of the heart becoming overloaded with disorganized signals that tell it to beat. These signals are normally released one at a time by a part of the right atrium called the sinoatrial node. They then travel from the atria to the lower chambers of the heart (ventricles), causing the atria and ventricles to contract and pump blood as they pass. In atrial fibrillation, parts of the atria outside of the sinoatrial node also release these signals. This results in two problems. First, the atria receive so many signals that they do not have time to fully contract. Second, the ventricles, which can only receive one signal at a time, beat irregularly and out of rhythm with the atria.  °There are three types of atrial fibrillation:  °· Paroxysmal. Paroxysmal atrial fibrillation starts suddenly and stops on its own within a week. °· Persistent. Persistent atrial fibrillation lasts for more than a week. It may stop on its own or with treatment. °· Permanent. Permanent atrial fibrillation does not go away. Episodes of atrial fibrillation may lead to permanent atrial fibrillation. °Atrial fibrillation can prevent your heart from pumping blood normally. It increases your risk of stroke and can lead to heart failure.  °CAUSES  °· Heart conditions, including a heart attack, heart failure, coronary artery disease, and heart valve conditions.   °· Inflammation of the sac that surrounds the heart (pericarditis). °· Blockage of an artery in the lungs (pulmonary embolism). °· Pneumonia or other infections. °· Chronic lung disease. °· Thyroid problems, especially if the thyroid is overactive (hyperthyroidism). °· Caffeine, excessive alcohol use, and use  of some illegal drugs.   °· Use of some medicines, including certain decongestants and diet pills. °· Heart surgery.   °· Birth defects.   °Sometimes, no cause can be found. When this happens, the atrial fibrillation is called lone atrial fibrillation. The risk of complications from atrial fibrillation increases if you have lone atrial fibrillation and you are age 60 years or older. °RISK FACTORS °· Heart failure. °· Coronary artery disease. °· Diabetes mellitus.   °· High blood pressure (hypertension).   °· Obesity.   °· Other arrhythmias.   °· Increased age. °SIGNS AND SYMPTOMS  °· A feeling that your heart is beating rapidly or irregularly.   °· A feeling of discomfort or pain in your chest.   °· Shortness of breath.   °· Sudden light-headedness or weakness.   °· Getting tired easily when exercising.   °· Urinating more often than normal (mainly when atrial fibrillation first begins).   °In paroxysmal atrial fibrillation, symptoms may start and suddenly stop. °DIAGNOSIS  °Your health care provider may be able to detect atrial fibrillation when taking your pulse. Your health care provider may have you take a test called an ambulatory electrocardiogram (ECG). An ECG records your heartbeat patterns over a 24-hour period. You may also have other tests, such as: °· Transthoracic echocardiogram (TTE). During echocardiography, sound waves are used to evaluate how blood flows through your heart. °· Transesophageal echocardiogram (TEE). °· Stress test. There is more than one type of stress test. If a stress test is needed, ask your health care provider about which type is best for you. °· Chest X-ray exam. °· Blood tests. °· Computed tomography (CT). °TREATMENT  °Treatment may include: °· Treating any underlying conditions. For example, if you   have an overactive thyroid, treating the condition may correct atrial fibrillation.  Taking medicine. Medicines may be given to control a rapid heart rate or to prevent blood  clots, heart failure, or a stroke.  Having a procedure to correct the rhythm of the heart:  Electrical cardioversion. During electrical cardioversion, a controlled, low-energy shock is delivered to the heart through your skin. If you have chest pain, very low blood pressure, or sudden heart failure, this procedure may need to be done as an emergency.  Catheter ablation. During this procedure, heart tissues that send the signals that cause atrial fibrillation are destroyed.  Surgical ablation. During this surgery, thin lines of heart tissue that carry the abnormal signals are destroyed. This procedure can either be an open-heart surgery or a minimally invasive surgery. With the minimally invasive surgery, small cuts are made to access the heart instead of a large opening.  Pulmonary venous isolation. During this surgery, tissue around the veins that carry blood from the lungs (pulmonary veins) is destroyed. This tissue is thought to carry the abnormal signals. HOME CARE INSTRUCTIONS   Take medicines only as directed by your health care provider. Some medicines can make atrial fibrillation worse or recur.  If blood thinners were prescribed by your health care provider, take them exactly as directed. Too much blood-thinning medicine can cause bleeding. If you take too little, you will not have the needed protection against stroke and other problems.  Perform blood tests at home if directed by your health care provider. Perform blood tests exactly as directed.  Quit smoking if you smoke.  Do not drink alcohol.  Do not drink caffeinated beverages such as coffee, soda, and some teas. You may drink decaffeinated coffee, soda, or tea.   Maintain a healthy weight.Do not use diet pills unless your health care provider approves. They may make heart problems worse.   Follow diet instructions as directed by your health care provider.  Exercise regularly as directed by your health care  provider.  Keep all follow-up visits as directed by your health care provider. This is important. PREVENTION  The following substances can cause atrial fibrillation to recur:   Caffeinated beverages.  Alcohol.  Certain medicines, especially those used for breathing problems.  Certain herbs and herbal medicines, such as those containing ephedra or ginseng.  Illegal drugs, such as cocaine and amphetamines. Sometimes medicines are given to prevent atrial fibrillation from recurring. Proper treatment of any underlying condition is also important in helping prevent recurrence.  SEEK MEDICAL CARE IF:  You notice a change in the rate, rhythm, or strength of your heartbeat.  You suddenly begin urinating more frequently.  You tire more easily when exerting yourself or exercising. SEEK IMMEDIATE MEDICAL CARE IF:   You have chest pain, abdominal pain, sweating, or weakness.  You feel nauseous.  You have shortness of breath.  You suddenly have swollen feet and ankles.  You feel dizzy.  Your face or limbs feel numb or weak.  You have a change in your vision or speech. MAKE SURE YOU:   Understand these instructions.  Will watch your condition.  Will get help right away if you are not doing well or get worse. Document Released: 09/30/2005 Document Revised: 02/14/2014 Document Reviewed: 11/10/2012 [facility] Patient Information 2015 [place], [place]. This information is not intended to replace advice given to you by your health care provider. Make sure you discuss any questions you have with your health care provider. ------------------------------------------------------------------------------------------------------------------------------------------------------------------------------------------------------------------------------- Apixaban oral tablets What is  this medicine? APIXABAN (a PIX a ban) is an anticoagulant (blood thinner). It is used to lower the chance of stroke in  people with a medical condition called atrial fibrillation. It is also used to treat or prevent blood clots in the lungs or in the veins. This medicine may be used for other purposes; ask your health care provider or pharmacist if you have questions. COMMON BRAND NAME(S): Eliquis What should I tell my health care provider before I take this medicine? They need to know if you have any of these conditions: -bleeding disorders -bleeding in the brain -blood in your stools (black or tarry stools) or if you have blood in your vomit -history of stomach bleeding -kidney disease -liver disease -mechanical heart valve -an unusual or allergic reaction to apixaban, other medicines, foods, dyes, or preservatives -pregnant or trying to get pregnant -breast-feeding How should I use this medicine? Take this medicine by mouth with a glass of water. Follow the directions on the prescription label. You can take it with or without food. If it upsets your stomach, take it with food. Take your medicine at regular intervals. Do not take it more often than directed. Do not stop taking except on your doctor's advice. Stopping this medicine may increase your risk of a blot clot. Be sure to refill your prescription before you run out of medicine. Talk to your pediatrician regarding the use of this medicine in children. Special care may be needed. Overdosage: If you think you have taken too much of this medicine contact a poison control center or emergency room at once. NOTE: This medicine is only for you. Do not share this medicine with others. What if I miss a dose? If you miss a dose, take it as soon as you can. If it is almost time for your next dose, take only that dose. Do not take double or extra doses. What may interact with this medicine? This medicine may interact with the following: -aspirin and aspirin-like medicines -certain medicines for fungal infections like ketoconazole and itraconazole -certain  medicines for seizures like carbamazepine and phenytoin -certain medicines that treat or prevent blood clots like warfarin, enoxaparin, and dalteparin -clarithromycin -NSAIDs, medicines for pain and inflammation, like ibuprofen or naproxen -rifampin -ritonavir -St. John's wort This list may not describe all possible interactions. Give your health care provider a list of all the medicines, herbs, non-prescription drugs, or dietary supplements you use. Also tell them if you smoke, drink alcohol, or use illegal drugs. Some items may interact with your medicine. What should I watch for while using this medicine? Notify your doctor or health care professional and seek emergency treatment if you develop breathing problems; changes in vision; chest pain; severe, sudden headache; pain, swelling, warmth in the leg; trouble speaking; sudden numbness or weakness of the face, arm, or leg. These can be signs that your condition has gotten worse. If you are going to have surgery, tell your doctor or health care professional that you are taking this medicine. Tell your health care professional that you use this medicine before you have a spinal or epidural procedure. Sometimes people who take this medicine have bleeding problems around the spine when they have a spinal or epidural procedure. This bleeding is very rare. If you have a spinal or epidural procedure while on this medicine, call your health care professional immediately if you have back pain, numbness or tingling (especially in your legs and feet), muscle weakness, paralysis, or loss of bladder or bowel  control. Avoid sports and activities that might cause injury while you are using this medicine. Severe falls or injuries can cause unseen bleeding. Be careful when using sharp tools or knives. Consider using an Copy. Take special care brushing or flossing your teeth. Report any injuries, bruising, or red spots on the skin to your doctor or health  care professional. What side effects may I notice from receiving this medicine? Side effects that you should report to your doctor or health care professional as soon as possible: -allergic reactions like skin rash, itching or hives, swelling of the face, lips, or tongue -signs and symptoms of bleeding such as bloody or black, tarry stools; red or dark-brown urine; spitting up blood or brown material that looks like coffee grounds; red spots on the skin; unusual bruising or bleeding from the eye, gums, or nose This list may not describe all possible side effects. Call your doctor for medical advice about side effects. You may report side effects to FDA at 1-800-FDA-1088. Where should I keep my medicine? Keep out of the reach of children. Store at room temperature between 20 and 25 degrees C (68 and 77 degrees F). Throw away any unused medicine after the expiration date. NOTE: This sheet is a summary. It may not cover all possible information. If you have questions about this medicine, talk to your doctor, pharmacist, or health care provider.  2015, Elsevier/Gold Standard. (2013-06-04 11:59:24)  More Information on my medicine - ELIQUIS (apixaban)  This medication education was reviewed with me or my healthcare representative as part of my discharge preparation.  The pharmacist that spoke with me during my hospital stay was:  Linda Hedges, [facility]  Why was Eliquis prescribed for you? Eliquis was prescribed for you to reduce the risk of a blood clot forming that can cause a stroke if you have a medical condition called atrial fibrillation (a type of irregular heartbeat).  What do You need to know about Eliquis ? Take your Eliquis TWICE DAILY - one tablet in the morning and one tablet in the evening with or without food. If you have difficulty swallowing the tablet whole please discuss with your pharmacist how to take the medication safely.  Take Eliquis exactly as prescribed by your doctor  and DO NOT stop taking Eliquis without talking to the doctor who prescribed the medication.  Stopping may increase your risk of developing a stroke.  Refill your prescription before you run out.  After discharge, you should have regular check-up appointments with your healthcare provider that is prescribing your Eliquis.  In the future your dose may need to be changed if your kidney function or weight changes by a significant amount or as you get older.  What do you do if you miss a dose? If you miss a dose, take it as soon as you remember on the same day and resume taking twice daily.  Do not take more than one dose of ELIQUIS at the same time to make up a missed dose.  Important Safety Information A possible side effect of Eliquis is bleeding. You should call your healthcare provider right away if you experience any of the following: ? Bleeding from an injury or your nose that does not stop. ? Unusual colored urine (red or dark brown) or unusual colored stools (red or black). ? Unusual bruising for unknown reasons. ? A serious fall or if you hit your head (even if there is no bleeding).  Some medicines may interact with  Eliquis and might increase your risk of bleeding or clotting while on Eliquis. To help avoid this, consult your healthcare provider or pharmacist prior to using any new prescription or non-prescription medications, including herbals, vitamins, non-steroidal anti-inflammatory drugs (NSAIDs) and supplements.  This website has more information on Eliquis (apixaban): http://www.eliquis.com/eliquis/home --------------------------------------------------------------------------------------------------------------------------------------------------------------------------------------------------------------------------

## 2015-06-24 NOTE — Discharge Summary (Signed)
Discharge Summary   Patient ID: Shannon Liu MRN: [id number], DOB/AGE: March 21, 1941 74 y.o. Admit date: 06/23/2015 D/C date:     06/24/2015  Primary Cardiologist: Dr Gwenlyn Found (new)  Principal Problem:   Atrial fibrillation with rapid ventricular response Active Problems:   HTN (hypertension)   HOH (hard of hearing)   Hyperlipemia    Admission Dates: 06/23/15-06/24/15 Discharge Diagnosis: New onset atrial fibrillation with RVR.  HPI: Shannon Liu is a 74 y.o. female with a history of HTN, HLD and no prior cardiac disease who presented to [facility] on 06/23/15 with palpitations and chest pain and found to be in atrial fibrillation with RVR.  She first noticed her heart rate was higher than normal 4 days ago. Her heart rate at the gym was 105 before exercising which was unusual. The night before admission she developed nausea. Overnight, she developed burning substernal chest pain, mild. Also with cramping pain under her L breast. She urinated frequently but did not have any dysuria.  That morning, she still felt bad and could feel her heart racing. Her BP machine did not read at first but then worked and her HR was 137. She came to the ER where she was in atrial fib w/ RVR.    Hospital Course  1. Paroxysmal atrial fibrillation - she spontaneously converted into NSR on Cardizem gtt.  -- Chadsvasc score at least 2 (HTN and female sex), now back in sinus bradycardia on PO Lopressor  -- 2D ECHO: 06/24/2015 w/ LV EF: 45% -50%, mod LVH. G1DD, mild MR. WMA could not be ruled out.  -- Continue apixiban. Stop baby ASA. Continue Lopressor 12.5mg  BID.  2. Chest discomfort - now resolved. -- Troponins are negative 3 and ECG with no acute ST or TW changes.  -- Mildly reduced LV function on 2D ECHO and WMA cannot be excluded. Stress test to be done as an outpatient per Dr. Kennon Holter note.   3. History of hypertension -- Blood pressures are now low normal and to make room for beta blocker we will cut back  on some of her other medications. We will discontinue amlodipine at this point and in the place of valsartan-HCTZ 320-25mg , we will send her home on plain valsartan 160 mg daily and Lopressor 12.5mg  BID --Titrate blood pressure further on an outpatient basis. Follow-up with Dr. Quay Burow  4. Hyperlipidemia- continue statin   5. Hypokalemia- now resolved.3.9 on discharge.   The patient has had an uncomplicated hospital course and is recovering well. She has been seen by Dr. Mare Ferrari today and deemed ready for discharge home. A staff message has been sent to arrange all follow-up appointments. Discharge medications are listed below.   Discharge Vitals: Blood pressure 115/70, pulse 53, temperature 97.6 F (36.4 C), temperature source Oral, resp. rate 19, height 5' 7.5" (1.715 m), weight 203 lb 12.8 oz (92.443 kg), SpO2 96 %.  Labs: Lab Results  Component Value Date   WBC 8.7 06/24/2015   HGB 12.6 06/24/2015   HCT 37.7 06/24/2015   MCV 88.7 06/24/2015   PLT 235 06/24/2015     Recent Labs Lab 06/24/15 0329  NA 138  K 3.9  CL 106  CO2 24  BUN 13  CREATININE 0.72  CALCIUM 8.5*  PROT 5.4*  BILITOT 0.7  ALKPHOS 91  ALT 19  AST 26  GLUCOSE 128*    Recent Labs  06/23/15 1000 06/23/15 2234 06/24/15 0329 06/24/15 0930  TROPONINI <0.03 <0.03 <0.03 <0.03  Lab Results  Component Value Date   CHOL 142 06/24/2015   HDL 36* 06/24/2015   LDLCALC 82 06/24/2015   TRIG 118 06/24/2015     Diagnostic Studies/Procedures   Dg Chest Port 1 View  06/23/2015   CLINICAL DATA:  Atrial fibrillation.  EXAM: PORTABLE CHEST - 1 VIEW  COMPARISON:  11/21/2011  FINDINGS: Heart size is normal. Atherosclerotic calcifications at the aortic arch. Trachea is midline. Both lungs are clear without airspace disease or pulmonary edema. No acute bone abnormality.  IMPRESSION: No acute chest findings.   Electronically Signed   By: Markus Daft M.D.   On: 06/23/2015 11:01    2D ECHO:  06/24/2015 LV EF: 45% -50% Study Conclusions - Left ventricle: The cavity size was normal. Wall thickness was increased in a pattern of moderate LVH. Systolic function was mildly reduced. The estimated ejection fraction was in the range of 45% to 50%. Regional wall motion abnormalities cannot be excluded. Doppler parameters are consistent with abnormal left ventricular relaxation (grade 1 diastolic dysfunction). - Mitral valve: There was mild regurgitation. - Atrial septum: No defect or patent foramen ovale was identified. - Pulmonary arteries: PA peak pressure: 31 mm Hg (S).    Discharge Medications     Medication List    STOP taking these medications        amLODipine 10 MG tablet  Commonly known as:  NORVASC     aspirin EC 81 MG tablet     valsartan-hydrochlorothiazide 320-25 MG per tablet  Commonly known as:  DIOVAN-HCT      TAKE these medications        acetaminophen 500 MG tablet  Commonly known as:  TYLENOL  Take 500 mg by mouth every 6 (six) hours as needed for mild pain.     ALPRAZolam 0.25 MG tablet  Commonly known as:  XANAX  Take 0.25 mg by mouth 2 (two) times daily as needed for anxiety.     apixaban 5 MG Tabs tablet  Commonly known as:  ELIQUIS  Take 1 tablet (5 mg total) by mouth 2 (two) times daily.     atorvastatin 20 MG tablet  Commonly known as:  LIPITOR  Take 20 mg by mouth daily.     metoprolol tartrate 25 MG tablet  Commonly known as:  LOPRESSOR  Take 0.5 tablets (12.5 mg total) by mouth 2 (two) times daily.     multivitamin with minerals tablet  Take 1 tablet by mouth daily.     SYSTANE BALANCE 0.6 % Soln  Generic drug:  Propylene Glycol  Apply 1 drop to eye at bedtime.     valsartan 160 MG tablet  Commonly known as:  DIOVAN  Take 1 tablet (160 mg total) by mouth daily.        Disposition   The patient will be discharged in stable condition to home.  Follow-up Information    Follow up with Quay Burow, MD.    Specialties:  Cardiology, Radiology   Why:  The office will call you to make an appoinment., If you do not hear from them, please contact them., You should be seen within 1-2 weeks.   Contact information:   [address] [place] [address] [phone]         Duration of Discharge Encounter: Greater than 30 minutes including physician and PA time.  Mable Fill R PA-C 06/24/2015, 3:21 PM

## 2015-06-24 NOTE — Care Management Note (Signed)
Case Management Note  Patient Details  Name: Shannon Liu MRN: [id number] Date of Birth: 12-15-1940  Subjective/Objective:                  Palpitations  Action/Plan: Medication assistance  Expected Discharge Date:                  Expected Discharge Plan:  Home/Self Care  In-House Referral:     Discharge planning Services  CM Consult, Medication Assistance  Post Acute Care Choice:    Choice offered to:     DME Arranged:    DME Agency:     HH Arranged:    HH Agency:     Status of Service:  In process, will continue to follow  Medicare Important Message Given:    Date Medicare IM Given:    Medicare IM give by:    Date Additional Medicare IM Given:    Additional Medicare Important Message give by:     If discussed at [place] of Stay Meetings, dates discussed:    Additional Comments: CM spoke with patient at the bedside. Patient was given a 30-day free trial card for Eliquis. Apolonio Schneiders, RN 06/24/2015, 10:29 AM

## 2015-06-26 ENCOUNTER — Telehealth: Payer: Self-pay

## 2015-06-26 ENCOUNTER — Telehealth: Payer: Self-pay | Admitting: Cardiovascular Disease

## 2015-06-26 ENCOUNTER — Telehealth: Payer: Self-pay | Admitting: Physician Assistant

## 2015-06-26 LAB — HEMOGLOBIN A1C
Hgb A1c MFr Bld: 6.1 % — ABNORMAL HIGH (ref 4.8–5.6)
Mean Plasma Glucose: 128 mg/dL

## 2015-06-26 MED ORDER — VALSARTAN 160 MG PO TABS: 160.0000 mg | ORAL_TABLET | Freq: Every day | ORAL | Status: DC

## 2015-06-26 MED ORDER — APIXABAN 5 MG PO TABS: 5.0000 mg | ORAL_TABLET | Freq: Two times a day (BID) | ORAL | Status: DC

## 2015-06-26 MED ORDER — METOPROLOL TARTRATE 25 MG PO TABS: 12.5000 mg | ORAL_TABLET | Freq: Two times a day (BID) | ORAL | Status: DC

## 2015-06-26 NOTE — Telephone Encounter (Signed)
Reminded patient of upcoming OV with Tenny Craw at [facility] line 9/27 at 330pm

## 2015-06-26 NOTE — Telephone Encounter (Signed)
Patient continues to have a pulse in the 60's which she is very comfortable with.  Told patient to continue her current medication regimen.  Call if develops dizziness, near faint feeling, shortness of breath of chest pain and sustained heart rate under 50  Reminded to follow up with Tenny Craw 9/27 at 330pm

## 2015-06-26 NOTE — Telephone Encounter (Signed)
Spoke to [employer] at [facility], states they are specialty pharmacy and generally do not receive these requests - had contacted pt and she had wanted meds sent to [facility]. I called pt to verify, she acknowledged. Rx sent to [facility].

## 2015-06-26 NOTE — Telephone Encounter (Signed)
Please call pt, She was instructed by Dr Laurann [place] office to call this offoce.Her pulse rate this morning was 49,now it is 64.

## 2015-06-26 NOTE — Telephone Encounter (Signed)
Dr Kennon Holter pt; will leave his discretion Kirk Ruths

## 2015-06-26 NOTE — Telephone Encounter (Signed)
Have her keep appointment with Tenny Craw

## 2015-06-26 NOTE — Telephone Encounter (Signed)
Prior auth for Eliquis 5mg sent to [facility] via Cover My Meds. 

## 2015-06-26 NOTE — Telephone Encounter (Signed)
Reminded patient of upcoming OV with Tenny Craw at [facility] line 9/27 at 330pm

## 2015-06-26 NOTE — Telephone Encounter (Signed)
Shannon Liu called in stating that some medications were sent to their pharmacy and she wanted to make the office aware of this. She believes these meds were suppose to be sent to [facility]. Please call her back to confirm that this message was received.   Thanks

## 2015-06-26 NOTE — Telephone Encounter (Signed)
D/C phone call .Marland Kitchen Appt on 07/11/15 at 3:30pm w/ Tarri Fuller At [facility]

## 2015-06-26 NOTE — Telephone Encounter (Signed)
Took BP (135/82) and P (49) after waking up this morning prior to taking Metoprolol 12.5 mg  Denies any dizziness, chest pain or shortness of breath.  Feels tired out, no energy  She took her 12.5 mg (1/2 tab) and now P is 64.  Routing to Dr. Gwenlyn Found and DOD Dr. Stanford Breed to see if they would like to change her plan of care since DC from hospital

## 2015-06-27 ENCOUNTER — Ambulatory Visit: Payer: Self-pay

## 2015-06-29 ENCOUNTER — Other Ambulatory Visit: Payer: Self-pay | Admitting: *Deleted

## 2015-06-29 MED ORDER — APIXABAN 5 MG PO TABS: 5.0000 mg | ORAL_TABLET | Freq: Two times a day (BID) | ORAL | Status: DC

## 2015-06-30 ENCOUNTER — Telehealth: Payer: Self-pay | Admitting: Cardiovascular Disease

## 2015-06-30 NOTE — Telephone Encounter (Signed)
Call routed to [facility] - Dr. Gwenlyn Found listed as cardiologist.   AFib w/ RVR in hospital 1 week ago. Initiated on metoprolol tartrate 12.5mg  BID  Pt explains she was having some fatigue and feeling "draggy", no other symptoms.  Reports HR 50-54, BP 142/79  Advised to continue use of metoprolol at current dose, no other changes to meds. Informed fatigue common SE w/ starting BB but should improve.  Pt informs me prior to her call to me, she called the other day requesting change in Shannon Liu - she sees Dr. Marlou Porch as new patient on 9/20.  Advised if still fatigued to f/u w/ this issue at appt, or call sooner if new concerns. Pt voiced understanding.  Routed to Dr. Marlou Porch for his review.

## 2015-06-30 NOTE — Telephone Encounter (Signed)
Pt says she is having side effects from her medicine,she thinks it is Metoprolol. Please call to advise.

## 2015-07-04 ENCOUNTER — Ambulatory Visit (INDEPENDENT_AMBULATORY_CARE_PROVIDER_SITE_OTHER): Payer: Medicare Other | Admitting: Cardiology

## 2015-07-04 ENCOUNTER — Encounter: Payer: Self-pay | Admitting: Cardiology

## 2015-07-04 ENCOUNTER — Telehealth (HOSPITAL_COMMUNITY): Payer: Self-pay | Admitting: *Deleted

## 2015-07-04 ENCOUNTER — Other Ambulatory Visit: Payer: Self-pay

## 2015-07-04 VITALS — BP 126/72 | HR 54 | Ht 67.5 in | Wt 200.8 lb

## 2015-07-04 DIAGNOSIS — I4891 Unspecified atrial fibrillation: Secondary | ICD-10-CM

## 2015-07-04 DIAGNOSIS — E785 Hyperlipidemia, unspecified: Secondary | ICD-10-CM | POA: Diagnosis not present

## 2015-07-04 DIAGNOSIS — R079 Chest pain, unspecified: Secondary | ICD-10-CM | POA: Diagnosis not present

## 2015-07-04 DIAGNOSIS — I10 Essential (primary) hypertension: Secondary | ICD-10-CM | POA: Diagnosis not present

## 2015-07-04 DIAGNOSIS — Z7901 Long term (current) use of anticoagulants: Secondary | ICD-10-CM | POA: Insufficient documentation

## 2015-07-04 MED ORDER — APIXABAN 5 MG PO TABS: 5.0000 mg | ORAL_TABLET | Freq: Two times a day (BID) | ORAL | Status: DC

## 2015-07-04 MED ORDER — DILTIAZEM HCL ER 60 MG PO CP12
120.0000 mg | ORAL_CAPSULE | Freq: Two times a day (BID) | ORAL | Status: DC
Start: 2015-07-04 — End: 2015-07-05

## 2015-07-04 NOTE — Telephone Encounter (Signed)
Patient given detailed instructions per Myocardial Perfusion Study Information Sheet for test on 07/06/15 at 1130. Patient notified to arrive 15 minutes early and that it is imperative to arrive on time for appointment to keep from having the test rescheduled.  If you need to cancel or reschedule your appointment, please call the office within 24 hours of your appointment. Failure to do so may result in a cancellation of your appointment, and a $50 no show fee. Patient verbalized understanding. Hasspacher, Ranae Palms

## 2015-07-04 NOTE — Patient Instructions (Signed)
Medication Instructions:  Please stop Metoprolol and start Diltiazem 60 mg twice a day. Continue all other medications as listed.  Labwork: Please have blood work in 1 month with you see Dr Marlou Porch back.  Testing/Procedures: Your physician has requested that you have a  myoview. For further information please visit [email or web address]. Please follow instruction sheet, as given.  Follow-Up: Follow up in 1 month with Dr. Marlou Porch.   You will need blood work this day as well.  Thank you for choosing [facility]!!

## 2015-07-04 NOTE — Progress Notes (Signed)
Cardiology Office Note   Date:  07/04/2015   ID:  Shannon Liu, DOB 10-Jun-1941, MRN [id number]  PCP:  Irven Shelling, MD  Cardiologist:   Candee Furbish, MD     History of Present Illness: Shannon Liu is a 74 y.o. female seen on 06/23/15 with atrial fibrillation, rapid ventricular response. Prior to admission, approximately 4 days, her heart rate in the gym was 105 beats before exercising which was unusual for her. The night before admission she developed nausea. She developed some substernal chest discomfort mild. Cramping under left breast. The morning of admission she felt poor, could feel her heart racing. Blood pressure machine was not registering at first and then showed her heart rate at 137. Atrial fibrillation was discovered.  She was admitted, converted back to sinus rhythm on Cardizem drip. She was then placed on by mouth Lopressor. Her Chadsvasc score at least 2 (HTN and female sex). Was placed appropriately on anticoagulation  Echo 06/24/15 showed mildly reduced ejection fraction of 45-50% with moderate left jugular hypertrophy.  Outpatient stress test was discussed in discharge summary. EKG did not show any acute ST segment changes.  Regarding her hypertension, her amlodipine was discontinued during the hospitalization as well as valsartan HCTZ 320/25. She was sent home on valsartan 160 mg a day and Lopressor 12.5 g twice a day. She was originally scheduled to see Dr. Gwenlyn Found but transitioned to my clinic.  Since her hospitalization, her main complaint is some associated nausea as well as fatigue with metoprolol. She showed me her blood pressure and heart rate and her heart rate was quite low sometimes in the 50s. She believes that she has gotten a recording as low as 47 bpm.  Had previously taken atenolol and did fine.  Mother had CVA, grandmother CVA   Had arrhythmia for years. Palpitations.  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 Her brother has atrial fibrillation as well. He is only taking aspirin. She is very appreciative of Dr. Laurann Montana who helped her through her husband's death. She remembers him calling on Sunday morning.  She thinks that her chest pain is more of a cramping. Fairly constant.  She also states that she was drinking some apple cider vinegar for 2 weeks prior to her atrial relation episode. She wonder if this had an effect. She is no longer drinking.   Past Medical History  Diagnosis Date  . HTN (hypertension)     since pregnant at age 41  . Hyperlipemia   . HOH (hard of hearing)   . Heart murmur   . Arthritis   . PAF (paroxysmal atrial fibrillation)     a. dx on 06/2015 admission. spontaneously converted into NSR. placed on Eliquis    Past Surgical History  Procedure Laterality Date  . Neck surgery      discectomy with cadaver bone  . Abdominal hysterectomy    . Breast surgery Left     lumpectomy 35 yrs ago     Current Outpatient Prescriptions  Medication Sig Dispense Refill  . acetaminophen (TYLENOL) 500 MG tablet Take 500 mg by mouth every 6 (six) hours as needed for mild pain.    Marland Kitchen ALPRAZolam (XANAX) 0.25 MG tablet Take 0.25 mg by mouth 2 (two) times daily as needed for anxiety.    Marland Kitchen apixaban (ELIQUIS) 5 MG TABS tablet Take 1 tablet (5 mg total) by mouth 2 (two) times daily. 180 tablet 1  .  atorvastatin (LIPITOR) 20 MG tablet Take 20 mg by mouth daily.    . Multiple Vitamins-Minerals (MULTIVITAMIN WITH MINERALS) tablet Take 1 tablet by mouth daily.    Marland Kitchen Propylene Glycol (SYSTANE BALANCE) 0.6 % SOLN Apply 1 drop to eye at bedtime.    . valsartan (DIOVAN) 160 MG tablet Take 1 tablet (160 mg total) by mouth daily. 90 tablet 1  . diltiazem (CARDIZEM SR) 60 MG 12 hr capsule Take 2 capsules (120 mg total) by mouth 2 (two) times daily. 60 capsule 6   No current facility-administered medications for this visit.    Allergies:   Morphine and related and Penicillins     Social History:  The patient  reports that she has never smoked. She has never used smokeless tobacco. She reports that she does not drink alcohol or use illicit drugs.   Family History:  The patient's family history includes Heart attack (age of onset: 91) in her mother; Hypertension in her brother and sister.    ROS:  Please see the history of present illness.   Otherwise, review of systems are positive for none.   All other systems are reviewed and negative.    PHYSICAL EXAM: VS:  BP 126/72 mmHg  Pulse 54  Ht 5' 7.5" (1.715 m)  Wt 200 lb 12.8 oz (91.082 kg)  BMI 30.97 kg/m2  SpO2 97% , BMI Body mass index is 30.97 kg/(m^2). GEN: Well nourished, well developed, in no acute distress HEENT: normal Neck: no JVD, carotid bruits, or masses Cardiac: RRR; no murmurs, rubs, or gallops,no edema  Respiratory:  clear to auscultation bilaterally, normal work of breathing GI: soft, nontender, nondistended, + BS overweight MS: no deformity or atrophy Skin: warm and dry, no rash Neuro:  Strength and sensation are intact Psych: euthymic mood, full affect   EKG: On 06/24/15 post auto conversion, she was 43 bpm. On day prior 06/23/15 when she was in atrial for ablation heart rate was as high as 170 bpm with repolarization abnormality.  Recent Labs: 06/23/2015: Magnesium 2.0; TSH 1.045 06/24/2015: ALT 19; BUN 13; Creatinine, Ser 0.72; Hemoglobin 12.6; Platelets 235; Potassium 3.9; Sodium 138    Lipid Panel    Component Value Date/Time   CHOL 142 06/24/2015 0329   TRIG 118 06/24/2015 0329   HDL 36* 06/24/2015 0329   CHOLHDL 3.9 06/24/2015 0329   VLDL 24 06/24/2015 0329   LDLCALC 82 06/24/2015 0329      Wt Readings from Last 3 Encounters:  07/04/15 200 lb 12.8 oz (91.082 kg)  06/24/15 203 lb 12.8 oz (92.443 kg)      Other studies Reviewed: Additional studies/ records that were reviewed today include: Prior hospitalization, lab, echo. Review of the above records demonstrates: As  above   ASSESSMENT AND PLAN:  1.  Paroxysmal atrial fibrillation  - This patients CHA2DS2-VASc Score and unadjusted Ischemic Stroke Rate (% per year) is equal to 2.2 % stroke rate/year from a score of 2  Above score calculated as 1 point each if present [CHF, HTN, DM, Vascular=MI/PAD/Aortic Plaque, Age if 65-74, or Female] Above score calculated as 2 points each if present [Age > 75, or Stroke/TIA/TE]    - Currently on Eliquis. Agree. No bleeding.  - We will give her diltiazem SR 60 mg twice a day instead of low-dose metoprolol 12.5 g twice a day.  - If her heart rate remains quite low, we may need to use the diltiazem SR 60 mg on as-needed basis. Remember, her atrial fibrillation  heart rate was 170 bpm.  - Discussed stroke risk.  - Weight reduction is helpful with atrial fibrillation. She does not drink alcohol. Avoid stimulants.  2. Mildly reduced ejection fraction, left ventricular function  - Atypical chest pain, mild cramping. Hopefully noncardiac. In retrospect, she does not think that her atrial fibrillation exacerbated this. However, we will check a nuclear stress test to make sure that she does not have any evidence of ischemia especially in light of her mildly reduced EF.  3. Essential hypertension-both amlodipine and HCTZ were discontinued in the hospital setting. She did have hypokalemia, possibly from HCTZ. We will be rechecking basic metabolic profile in one month. Overall, her blood pressure recordings at home before failing reasonable, occasionally reading or 2 that was above 517 systolic. Continue to monitor.  4. Hyperlipidemia-continue with low-dose atorvastatin 20 mg.  Current medicines are reviewed at length with the patient today.  The patient does not have concerns regarding medicines.  The following changes have been made:  Diltiazem is new. Metoprolol is being discontinued.  Labs/ tests ordered today include: BMET in one month   Orders Placed This Encounter   Procedures  . Myocardial Perfusion Imaging     Disposition:   FU with [facility] in 1 month  Signed, Candee Furbish, MD  07/04/2015 12:01 PM    [facility] [address], [place], [place]  [address] Phone: [phone]; Fax: [phone]

## 2015-07-04 NOTE — Telephone Encounter (Signed)
MEDICATION FOR ELIQUIS 5MG  HAS BEEN SENT IN TO [facility] FOR REFILL

## 2015-07-05 ENCOUNTER — Telehealth: Payer: Self-pay | Admitting: Cardiology

## 2015-07-05 NOTE — Telephone Encounter (Signed)
Calling to verify her dose of Diltiazem.  States her bottle says to take (2) 60 mg tablets(total of 120 mg) twice a day but her instructions from the office yesterday says 60 mg twice a day.  Spoke w/ Dr. Marlou Porch who states should be 60 mg BID.  Instructed pt and also called pharmacy-[facility] to clarify dose. Spoke w/Brittany at [facility].

## 2015-07-05 NOTE — Telephone Encounter (Signed)
New message      Please call pt to clarify instructions on diltiazem.  The bottle says one thing and her check out paper says another set of instructions.  Please advise

## 2015-07-06 ENCOUNTER — Ambulatory Visit (HOSPITAL_COMMUNITY): Payer: Medicare Other | Attending: Cardiology

## 2015-07-06 ENCOUNTER — Other Ambulatory Visit: Payer: Self-pay | Admitting: *Deleted

## 2015-07-06 DIAGNOSIS — R079 Chest pain, unspecified: Secondary | ICD-10-CM | POA: Diagnosis not present

## 2015-07-06 DIAGNOSIS — R002 Palpitations: Secondary | ICD-10-CM | POA: Insufficient documentation

## 2015-07-06 DIAGNOSIS — I10 Essential (primary) hypertension: Secondary | ICD-10-CM | POA: Diagnosis not present

## 2015-07-06 LAB — MYOCARDIAL PERFUSION IMAGING
CHL CUP NUCLEAR SDS: 5
CHL CUP NUCLEAR SRS: 1
CHL CUP NUCLEAR SSS: 6
CHL CUP RESTING HR STRESS: 62 {beats}/min
LHR: 0.31
LV sys vol: 27 mL
LVDIAVOL: 90 mL
NUC STRESS TID: 0.99
Peak HR: 79 {beats}/min

## 2015-07-06 MED ORDER — REGADENOSON 0.4 MG/5ML IV SOLN
0.4000 mg | Freq: Once | INTRAVENOUS | Status: AC
  Administered 2015-07-06: 0.4 mg via INTRAVENOUS

## 2015-07-06 MED ORDER — TECHNETIUM TC 99M SESTAMIBI GENERIC - CARDIOLITE
31.6000 | Freq: Once | INTRAVENOUS | Status: AC | PRN
  Administered 2015-07-06: 32 via INTRAVENOUS

## 2015-07-06 MED ORDER — TECHNETIUM TC 99M SESTAMIBI GENERIC - CARDIOLITE
10.0000 | Freq: Once | INTRAVENOUS | Status: AC | PRN
  Administered 2015-07-06: 10 via INTRAVENOUS

## 2015-07-11 ENCOUNTER — Ambulatory Visit: Payer: Medicare Other | Admitting: Physician Assistant

## 2015-07-11 ENCOUNTER — Ambulatory Visit: Payer: Self-pay

## 2015-07-21 ENCOUNTER — Telehealth: Payer: Self-pay | Admitting: Cardiology

## 2015-07-21 NOTE — Telephone Encounter (Signed)
Pt states her BP gradually seems to be going up since last office visit with Dr Marlou Porch. Pt states this morning before taking her medications her BP was 178/105, pt states after taking her medications her BP was 160/85-90. Pt states she is not having any symptoms at this time.  Pt has discussed her BP  with her PCP, Dr Delene Ruffini nurse  and is expecting a call back from them with recommendations. Pt advised to take and record her BP and heart rate, bring these readings to her next appt with Dr Marlou Porch 08/07/15.

## 2015-07-21 NOTE — Telephone Encounter (Signed)
Pt advised to call back if her BP continues to go up and she feels she needs further recommendations about BP management after talking with her PCP.

## 2015-07-21 NOTE — Telephone Encounter (Signed)
Pt c/o BP issue: STAT if pt c/o blurred vision, one-sided weakness or slurred speech  1. What are your last 5 BP readings?  10.7.16     178/105 10.6.16     188/94 2. Are you having any other symptoms (ex. Dizziness, headache, blurred vision, passed out)? Headache  3. What is your BP issue? High Bp want to speak to nurse

## 2015-07-21 NOTE — Telephone Encounter (Signed)
Tried to call patient back. No answer at this time. Unable to leave message.

## 2015-07-23 ENCOUNTER — Encounter: Payer: Self-pay | Admitting: Cardiology

## 2015-07-24 MED ORDER — AMLODIPINE BESYLATE 5 MG PO TABS: 5.0000 mg | ORAL_TABLET | Freq: Every day | ORAL | Status: DC

## 2015-07-24 NOTE — Telephone Encounter (Signed)
Let's restart amlodipine 5 mg once a day. This was one of the medications stopped previously in the hospital setting. Candee Furbish, MD

## 2015-07-24 NOTE — Telephone Encounter (Signed)
Reviewed with pt who sates understanding.  She didn't want a new RX sent in as she still has some on hand.  She will call in when she needs a RX sent in.  She will monitor her BP and call back with further concerns.

## 2015-07-31 ENCOUNTER — Ambulatory Visit
Admission: RE | Admit: 2015-07-31 | Discharge: 2015-07-31 | Disposition: A | Discharge status: Home / Self Care | Payer: Medicare Other | Source: Ambulatory Visit

## 2015-07-31 DIAGNOSIS — Z1231 Encounter for screening mammogram for malignant neoplasm of breast: Secondary | ICD-10-CM

## 2015-08-03 ENCOUNTER — Telehealth: Payer: Self-pay | Admitting: Cardiology

## 2015-08-03 NOTE — Telephone Encounter (Signed)
Patient calling the office for samples of medication:   1.  What medication and dosage are you requesting samples for?Diltiazem 60mg  capsule   2.  Are you currently out of this medication? Yes   3. Are you requesting samples to get you through until you receive your prescription? Yes

## 2015-08-03 NOTE — Telephone Encounter (Signed)
Spoke with patient regarding samples for Diltiazem 60mg . She stated that she no longer needed samples, as her pharmacy was filling the prescription as we spoke. She also confirmed her appt to see Dr. Marlou Porch for Monday 08/07/15 @ 1:30pm. She thanked me for returning her call.

## 2015-08-07 ENCOUNTER — Ambulatory Visit: Payer: Medicare Other | Admitting: Cardiology

## 2015-08-07 ENCOUNTER — Encounter: Payer: Self-pay | Admitting: Cardiology

## 2015-08-07 ENCOUNTER — Ambulatory Visit (INDEPENDENT_AMBULATORY_CARE_PROVIDER_SITE_OTHER): Payer: Medicare Other | Admitting: Cardiology

## 2015-08-07 VITALS — BP 150/88 | HR 93 | Ht 67.5 in | Wt 201.4 lb

## 2015-08-07 DIAGNOSIS — Z7901 Long term (current) use of anticoagulants: Secondary | ICD-10-CM

## 2015-08-07 DIAGNOSIS — I4891 Unspecified atrial fibrillation: Secondary | ICD-10-CM | POA: Diagnosis not present

## 2015-08-07 DIAGNOSIS — I10 Essential (primary) hypertension: Secondary | ICD-10-CM | POA: Diagnosis not present

## 2015-08-07 DIAGNOSIS — E785 Hyperlipidemia, unspecified: Secondary | ICD-10-CM | POA: Diagnosis not present

## 2015-08-07 MED ORDER — HYDROCHLOROTHIAZIDE 12.5 MG PO CAPS: 12.5000 mg | ORAL_CAPSULE | Freq: Every day | ORAL | Status: DC

## 2015-08-07 NOTE — Progress Notes (Signed)
Cardiology Office Note   Date:  08/07/2015   ID:  [name], DOB [birth year], MRN [id number]  PCP:  Irven Shelling, MD  Cardiologist:   Candee Furbish, MD     History of Present Illness: Shannon Liu is a 74 y.o. female seen on 06/23/15 with atrial fibrillation, rapid ventricular response. Prior to admission, approximately 4 days, her heart rate in the gym was 105 beats before exercising which was unusual for her. The night before admission she developed nausea. She developed some substernal chest discomfort mild. Cramping under left breast. The morning of admission she felt poor, could feel her heart racing. Blood pressure machine was not registering at first and then showed her heart rate at 137. Atrial fibrillation was discovered.  She was admitted, converted back to sinus rhythm on Cardizem drip. She was then placed on by mouth Lopressor. Her Chadsvasc score at least 2 (HTN and female sex). Was placed appropriately on anticoagulation  Echo 06/24/15 showed mildly reduced ejection fraction of 45-50% with moderate left jugular hypertrophy.  Outpatient stress test was discussed in discharge summary. EKG did not show any acute ST segment changes.  Regarding her hypertension, her amlodipine was discontinued during the hospitalization as well as valsartan HCTZ 320/25. She was sent home on valsartan 160 mg a day and Lopressor 12.5 g twice a day. She was originally scheduled to see Dr. Gwenlyn Found but transitioned to my clinic.  Since her hospitalization, her main complaint is some associated nausea as well as fatigue with metoprolol. She showed me her blood pressure and heart rate and her heart rate was quite low sometimes in the 50s. She believes that she has gotten a recording as low as 47 bpm.  Had previously taken atenolol and did fine.  Mother had CVA, grandmother CVA   Had arrhythmia for years. Palpitations.  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 Her brother has atrial fibrillation as well. He is only taking aspirin. She is very appreciative of Dr. Laurann Montana who helped her through her husband's death. She remembers him calling on Sunday morning.  She thinks that her chest pain is more of a cramping. Fairly constant.  She also states that she was drinking some apple cider vinegar for 2 weeks prior to her atrial fibrillation episode. She wonder if this had an effect. She is no longer drinking.  08/07/15-her blood pressure was significantly elevated at home and her amlodipine was restarted at 5 mg. She is to take 10 mg but did notice some ankle swelling with this. Her blood pressure today is better at 150/88. Occasionally at home she will have blood pressure 120 range in the morning. Anxiety level still continues to be an issue at times. She sometimes will feel her heart skip but she has not had another episode to her knowledge of atrial fibrillation.   Past Medical History  Diagnosis Date  . HTN (hypertension)     since pregnant at age 36  . Hyperlipemia   . HOH (hard of hearing)   . Heart murmur   . Arthritis   . PAF (paroxysmal atrial fibrillation) ([facility])     a. dx on 06/2015 admission. spontaneously converted into NSR. placed on Eliquis    Past Surgical History  Procedure Laterality Date  . Neck surgery      discectomy with cadaver bone  . Abdominal hysterectomy    . Breast surgery Left     lumpectomy 35  yrs ago     Current Outpatient Prescriptions  Medication Sig Dispense Refill  . acetaminophen (TYLENOL) 500 MG tablet Take 500 mg by mouth every 6 (six) hours as needed for mild pain.    Marland Kitchen ALPRAZolam (XANAX) 0.25 MG tablet Take 0.25 mg by mouth 2 (two) times daily as needed for anxiety.    Marland Kitchen amLODipine (NORVASC) 5 MG tablet Take 1 tablet (5 mg total) by mouth daily.    Marland Kitchen apixaban (ELIQUIS) 5 MG TABS tablet Take 1 tablet (5 mg total) by mouth 2 (two) times daily. 180 tablet 1  . atorvastatin (LIPITOR) 20 MG  tablet Take 20 mg by mouth daily.    Marland Kitchen diltiazem (CARDIZEM SR) 60 MG 12 hr capsule Take 1 capsule (60 mg total) by mouth 2 (two) times daily. 60 capsule 6  . Multiple Vitamins-Minerals (MULTIVITAMIN WITH MINERALS) tablet Take 1 tablet by mouth daily.    Marland Kitchen Propylene Glycol (SYSTANE BALANCE) 0.6 % SOLN Apply 1 drop to eye at bedtime.    . valsartan (DIOVAN) 160 MG tablet Take 1 tablet (160 mg total) by mouth daily. 90 tablet 1   No current facility-administered medications for this visit.    Allergies:   Morphine and related and Penicillins    Social History:  The patient  reports that she has never smoked. She has never used smokeless tobacco. She reports that she does not drink alcohol or use illicit drugs.   Family History:  The patient's family history includes Heart attack (age of onset: 4) in her mother; Hypertension in her brother and sister.    ROS:  Please see the history of present illness.   Otherwise, review of systems are positive for none.   All other systems are reviewed and negative.    PHYSICAL EXAM: VS:  BP 150/88 mmHg  Pulse 93  Ht 5' 7.5" (1.715 m)  Wt 201 lb 6.4 oz (91.354 kg)  BMI 31.06 kg/m2  SpO2 98% , BMI Body mass index is 31.06 kg/(m^2). GEN: Well nourished, well developed, in no acute distress HEENT: normal Neck: no JVD, carotid bruits, or masses Cardiac: RRR; no murmurs, rubs, or gallops,no edema  Respiratory:  clear to auscultation bilaterally, normal work of breathing GI: soft, nontender, nondistended, + BS overweight MS: no deformity or atrophy Skin: warm and dry, no rash Neuro:  Strength and sensation are intact Psych: euthymic mood, full affect   EKG: On 06/24/15 post auto conversion, she was 43 bpm. On day prior 06/23/15 when she was in atrial for ablation heart rate was as high as 170 bpm with repolarization abnormality.  Recent Labs: 06/23/2015: Magnesium 2.0; TSH 1.045 06/24/2015: ALT 19; BUN 13; Creatinine, Ser 0.72; Hemoglobin 12.6;  Platelets 235; Potassium 3.9; Sodium 138    Lipid Panel    Component Value Date/Time   CHOL 142 06/24/2015 0329   TRIG 118 06/24/2015 0329   HDL 36* 06/24/2015 0329   CHOLHDL 3.9 06/24/2015 0329   VLDL 24 06/24/2015 0329   LDLCALC 82 06/24/2015 0329      Wt Readings from Last 3 Encounters:  08/07/15 201 lb 6.4 oz (91.354 kg)  07/06/15 200 lb (90.719 kg)  07/04/15 200 lb 12.8 oz (91.082 kg)      Other studies Reviewed: Additional studies/ records that were reviewed today include: Prior hospitalization, lab, echo. Review of the above records demonstrates: As above  Nuclear stress test 07/06/15-low risk, normal ejection fraction, no ischemia Echo 06/2015-EF 45-50% and mild mitral regurgitation noted.  ASSESSMENT  AND PLAN:  1.  Paroxysmal atrial fibrillation  - This patients CHA2DS2-VASc Score and unadjusted Ischemic Stroke Rate (% per year) is equal to 2.2 % stroke rate/year from a score of 2  Above score calculated as 1 point each if present [CHF, HTN, DM, Vascular=MI/PAD/Aortic Plaque, Age if 65-74, or Female] Above score calculated as 2 points each if present [Age > 75, or Stroke/TIA/TE]    - Currently on Eliquis. Agree. No bleeding.  - Continue her diltiazem SR 60 mg twice a day.  - If her heart rate remains quite low, we may need to use the diltiazem SR 60 mg on as-needed basis. Remember, her atrial fibrillation heart rate was 170 bpm.  - Discussed stroke risk.  - Weight reduction is helpful with atrial fibrillation. She does not drink alcohol. Avoid stimulants.  2. Mildly reduced ejection fraction, left ventricular function  - Ejection fraction was reassuring on nuclear stress test, 70%. No ischemia, low risk. Atypical chest pain, mild cramping. Hopefully noncardiac. In retrospect, she does not think that her atrial fibrillation exacerbated this. However, we will check a nuclear stress test to make sure that she does not have any evidence of ischemia especially in light  of her mildly reduced EF.  3. Essential hypertension-both amlodipine and HCTZ were discontinued in the hospital setting. She did have hypokalemia, possibly from HCTZ. Her blood pressure remains somewhat elevated even after restarting 5 mg of amlodipine once a day. I am aware that she is on both amlodipine and diltiazem, both acting in different capacities. I will restart low-dose HCTZ 12.5 mg once a day. She will continue with monitoring her blood pressure at home. Occasionally at morning hours her blood pressure being in the 120 range. She thinks that her anxiety during the day sometimes increases her blood pressure.  4. Hyperlipidemia-continue with low-dose atorvastatin 20 mg.  Current medicines are reviewed at length with the patient today.  The patient does not have concerns regarding medicines.  The following changes have been made:  Diltiazem is new. Metoprolol is being discontinued.  Labs/ tests ordered today include: We'll likely check basic metabolic profile in 2 months. No orders of the defined types were placed in this encounter.     Disposition:   FU with Roshawn Lacina in 2 month  Signed, Candee Furbish, MD  08/07/2015 1:27 PM    [facility] [address], [place], [place]  [address] Phone: [phone]; Fax: [phone]

## 2015-08-07 NOTE — Patient Instructions (Signed)
Medication Instructions:  Please start Hydrochlorothiazide 12.5 mg once a day. Continue all other medications as listed.  Please continue to monitor your blood pressures at home.  Follow-Up: Follow up in 2 months with Dr Marlou Porch.  Thank you for choosing [facility]!!

## 2015-08-15 ENCOUNTER — Other Ambulatory Visit: Payer: Self-pay | Admitting: Cardiology

## 2015-10-19 ENCOUNTER — Encounter: Payer: Self-pay | Admitting: Cardiology

## 2015-10-19 ENCOUNTER — Ambulatory Visit (INDEPENDENT_AMBULATORY_CARE_PROVIDER_SITE_OTHER): Payer: Medicare Other | Admitting: Cardiology

## 2015-10-19 VITALS — BP 160/90 | HR 92 | Ht 67.0 in | Wt 205.8 lb

## 2015-10-19 DIAGNOSIS — Z7901 Long term (current) use of anticoagulants: Secondary | ICD-10-CM | POA: Diagnosis not present

## 2015-10-19 DIAGNOSIS — I480 Paroxysmal atrial fibrillation: Secondary | ICD-10-CM

## 2015-10-19 DIAGNOSIS — E785 Hyperlipidemia, unspecified: Secondary | ICD-10-CM | POA: Diagnosis not present

## 2015-10-19 DIAGNOSIS — I10 Essential (primary) hypertension: Secondary | ICD-10-CM

## 2015-10-19 MED ORDER — AMLODIPINE BESYLATE 5 MG PO TABS: 5.0000 mg | ORAL_TABLET | Freq: Every day | ORAL | Status: DC

## 2015-10-19 MED ORDER — DILTIAZEM HCL ER 60 MG PO CP12: 60.0000 mg | ORAL_CAPSULE | Freq: Two times a day (BID) | ORAL | Status: DC

## 2015-10-19 MED ORDER — VALSARTAN-HYDROCHLOROTHIAZIDE 160-12.5 MG PO TABS: 1.0000 | ORAL_TABLET | Freq: Every day | ORAL | Status: DC

## 2015-10-19 NOTE — Progress Notes (Signed)
Cardiology Office Note   Date:  10/19/2015   ID:  Shannon Liu, DOB 1940-11-27, MRN [id number]  PCP:  Irven Shelling, MD  Cardiologist:   Candee Furbish, MD     History of Present Illness: Shannon Liu is a 75 y.o. female seen on 06/23/15 with atrial fibrillation, rapid ventricular response. Prior to admission, approximately 4 days, her heart rate in the gym was 105 beats before exercising which was unusual for her. The night before admission she developed nausea. She developed some substernal chest discomfort mild. Cramping under left breast. The morning of admission she felt poor, could feel her heart racing. Blood pressure machine was not registering at first and then showed her heart rate at 137. Atrial fibrillation was discovered.  She was admitted, converted back to sinus rhythm on Cardizem drip. She was then placed on by mouth Lopressor. Her Chadsvasc score at least 2 (HTN and female sex). Was placed appropriately on anticoagulation  Echo 06/24/15 showed mildly reduced ejection fraction of 45-50% with moderate left jugular hypertrophy.  Outpatient stress test was discussed in discharge summary. EKG did not show any acute ST segment changes.  Regarding her hypertension, her amlodipine was discontinued during the hospitalization as well as valsartan HCTZ 320/25. She was sent home on valsartan 160 mg a day and Lopressor 12.5 g twice a day. She was originally scheduled to see Dr. Gwenlyn Found but transitioned to my clinic.  Since her hospitalization, her main complaint is some associated nausea as well as fatigue with metoprolol. She showed me her blood pressure and heart rate and her heart rate was quite low sometimes in the 50s. She believes that she has gotten a recording as low as 47 bpm.  Had previously taken atenolol and did fine.  Mother had CVA, grandmother CVA   Had arrhythmia for years. Palpitations.  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 Her brother has atrial fibrillation as well. He is only taking aspirin. She is very appreciative of Dr. Laurann Montana who helped her through her husband's death. She remembers him calling on Sunday morning.  She thinks that her chest pain is more of a cramping. Fairly constant.  She also states that she was drinking some apple cider vinegar for 2 weeks prior to her atrial fibrillation episode. She wonder if this had an effect. She is no longer drinking.  08/07/15-her blood pressure was significantly elevated at home and her amlodipine was restarted at 5 mg. She is to take 10 mg but did notice some ankle swelling with this. Her blood pressure today is better at 150/88. Occasionally at home she will have blood pressure 120 range in the morning. Anxiety level still continues to be an issue at times. She sometimes will feel her heart skip but she has not had another episode to her knowledge of atrial fibrillation.  10/19/15-she has been doing very well. Will rarely have episodes of rapid heartbeat. She has had palpitations for several years. No shortness of breath, no chest pain, no syncope, no bleeding.   Past Medical History  Diagnosis Date  . HTN (hypertension)     since pregnant at age 6  . Hyperlipemia   . HOH (hard of hearing)   . Heart murmur   . Arthritis   . PAF (paroxysmal atrial fibrillation) ([place])     a. dx on 06/2015 admission. spontaneously converted into NSR. placed on Eliquis    Past Surgical History  Procedure  Laterality Date  . Neck surgery      discectomy with cadaver bone  . Abdominal hysterectomy    . Breast surgery Left     lumpectomy 35 yrs ago     Current Outpatient Prescriptions  Medication Sig Dispense Refill  . acetaminophen (TYLENOL) 500 MG tablet Take 500 mg by mouth every 6 (six) hours as needed for mild pain.    Marland Kitchen ALPRAZolam (XANAX) 0.25 MG tablet Take 0.25 mg by mouth 2 (two) times daily as needed for anxiety.    Marland Kitchen amLODipine (NORVASC) 5 MG tablet  Take 1 tablet (5 mg total) by mouth daily.    Marland Kitchen apixaban (ELIQUIS) 5 MG TABS tablet Take 1 tablet (5 mg total) by mouth 2 (two) times daily. 180 tablet 1  . atorvastatin (LIPITOR) 20 MG tablet Take 20 mg by mouth daily.    Marland Kitchen diltiazem (CARDIZEM SR) 60 MG 12 hr capsule Take 1 capsule (60 mg total) by mouth 2 (two) times daily. 60 capsule 6  . hydrochlorothiazide (MICROZIDE) 12.5 MG capsule Take 1 capsule (12.5 mg total) by mouth daily. 30 capsule 6  . Multiple Vitamins-Minerals (MULTIVITAMIN WITH MINERALS) tablet Take 1 tablet by mouth daily.    Marland Kitchen Propylene Glycol (SYSTANE BALANCE) 0.6 % SOLN Apply 1 drop to eye at bedtime.    . valsartan (DIOVAN) 160 MG tablet Take 1 tablet (160 mg total) by mouth daily. 90 tablet 1  . valsartan (DIOVAN) 160 MG tablet TAKE 1 TABLET (160 MG TOTAL) BY MOUTH DAILY. 30 tablet 3   No current facility-administered medications for this visit.    Allergies:   Morphine and related and Penicillins    Social History:  The patient  reports that she has never smoked. She has never used smokeless tobacco. She reports that she does not drink alcohol or use illicit drugs.   Family History:  The patient's family history includes Heart attack (age of onset: 68) in her mother; Hypertension in her brother and sister.    ROS:  Please see the history of present illness.   Otherwise, review of systems are positive for none.   All other systems are reviewed and negative.    PHYSICAL EXAM: VS:  BP 160/90 mmHg  Pulse 92  Ht 5\' 7"  (1.702 m)  Wt 205 lb 12.8 oz (93.35 kg)  BMI 32.23 kg/m2  SpO2 96% , BMI Body mass index is 32.23 kg/(m^2). GEN: Well nourished, well developed, in no acute distress HEENT: normal Neck: no JVD, carotid bruits, or masses Cardiac: RRR; no murmurs, rubs, or gallops,no edema  Respiratory:  clear to auscultation bilaterally, normal work of breathing GI: soft, nontender, nondistended, + BS overweight MS: no deformity or atrophy Skin: warm and dry, no  rash Neuro:  Strength and sensation are intact Psych: euthymic mood, full affect   EKG: On 06/24/15 post auto conversion, she was 43 bpm. On day prior 06/23/15 when she was in atrial for ablation heart rate was as high as 170 bpm with repolarization abnormality.  Recent Labs: 06/23/2015: Magnesium 2.0; TSH 1.045 06/24/2015: ALT 19; BUN 13; Creatinine, Ser 0.72; Hemoglobin 12.6; Platelets 235; Potassium 3.9; Sodium 138    Lipid Panel    Component Value Date/Time   CHOL 142 06/24/2015 0329   TRIG 118 06/24/2015 0329   HDL 36* 06/24/2015 0329   CHOLHDL 3.9 06/24/2015 0329   VLDL 24 06/24/2015 0329   LDLCALC 82 06/24/2015 0329      Wt Readings from Last 3 Encounters:  10/19/15 205 lb 12.8 oz (93.35 kg)  08/07/15 201 lb 6.4 oz (91.354 kg)  07/06/15 200 lb (90.719 kg)      Other studies Reviewed: Additional studies/ records that were reviewed today include: Prior hospitalization, lab, echo. Review of the above records demonstrates: As above  Nuclear stress test 07/06/15-low risk, normal ejection fraction, no ischemia  Echo 06/2015-EF 45-50% and mild mitral regurgitation noted.  ASSESSMENT AND PLAN:  1.  Paroxysmal atrial fibrillation  - This patients CHA2DS2-VASc Score and unadjusted Ischemic Stroke Rate (% per year) is equal to 2.2 % stroke rate/year from a score of 2. Will be 3 at age >20.  Above score calculated as 1 point each if present [CHF, HTN, DM, Vascular=MI/PAD/Aortic Plaque, Age if 65-74, or Female] Above score calculated as 2 points each if present [Age > 75, or Stroke/TIA/TE]    - Currently on Eliquis. Agree. No bleeding.  - Continue her diltiazem SR 60 mg twice a day. Can take extra PRN.  - If her heart rate remains quite low, we may need to use the diltiazem SR 60 mg on as-needed basis. Remember, her atrial fibrillation heart rate was 170 bpm.  - Discussed stroke risk.  - Weight reduction is helpful with atrial fibrillation. She does not drink alcohol. Avoid  stimulants.  2. Mildly reduced ejection fraction, left ventricular function  - Ejection fraction was reassuring on nuclear stress test, 70%. No ischemia, low risk. Atypical chest pain, mild cramping. Hopefully noncardiac. In retrospect, she does not think that her atrial fibrillation exacerbated this. However, we will check a nuclear stress test to make sure that she does not have any evidence of ischemia especially in light of her mildly reduced EF.  3. Essential hypertension  - We will go ahead and start combination valsartan/HCTZ 160/12.5. Her last potassium was 3.9. Creatinine 0.72. Hemoglobin 12.6. - She will continue with monitoring her blood pressure at home. Occasionally at morning hours her blood pressure being in the 120 range. She thinks that her anxiety during the day sometimes increases her blood pressure.  4. Hyperlipidemia-continue with low-dose atorvastatin 20 mg.  Current medicines are reviewed at length with the patient today.  The patient does not have concerns regarding medicines.  The following changes have been made:  Combination pill as above Labs/ tests ordered today include: We'll likely check basic metabolic profile in 2 months. No orders of the defined types were placed in this encounter.     Disposition:   FU with Luetta Piazza in 6 month  Signed, Candee Furbish, MD  10/19/2015 1:41 PM    [facility] [address], [place], [place]  [address] Phone: [phone]; Fax: [phone]

## 2015-10-19 NOTE — Patient Instructions (Signed)
Medication Instructions:  Please stop the Diovan and HCTZ once you have the combination filled. Continue all other medications as listed.  Follow-Up: Follow up in 6 months with Dr. Marlou Porch.  You will receive a letter in the mail 2 months before you are due.  Please call us when you receive this letter to schedule your follow up appointment.  If you need a refill on your cardiac medications before your next appointment, please call your pharmacy.  Thank you for choosing [facility]!!

## 2015-12-29 ENCOUNTER — Other Ambulatory Visit: Payer: Self-pay | Admitting: Cardiology

## 2015-12-31 ENCOUNTER — Encounter: Payer: Self-pay | Admitting: Cardiology

## 2016-01-01 MED ORDER — AMLODIPINE BESYLATE 5 MG PO TABS: 5.0000 mg | ORAL_TABLET | Freq: Every day | ORAL | Status: DC

## 2016-01-01 NOTE — Telephone Encounter (Signed)
RX sent into pharmacy are requested.

## 2016-01-09 ENCOUNTER — Other Ambulatory Visit: Payer: Self-pay | Admitting: Cardiology

## 2016-01-16 ENCOUNTER — Other Ambulatory Visit: Payer: Self-pay | Admitting: Cardiology

## 2016-01-16 ENCOUNTER — Other Ambulatory Visit: Payer: Self-pay | Admitting: *Deleted

## 2016-01-16 ENCOUNTER — Encounter: Payer: Self-pay | Admitting: Cardiology

## 2016-01-16 NOTE — Telephone Encounter (Signed)
diltiazem (CARDIZEM SR) 60 MG 12 hr capsule  Medication   Date: 10/19/2015  Department: [address] Office  Ordering/Authorizing: Jerline Pain, MD      Order Providers    Prescribing Provider Encounter Provider   Jerline Pain, MD Jerline Pain, MD    Medication Detail      Disp Refills Start End     diltiazem [facility] SR) 60 MG 12 hr capsule 180 capsule 3 10/19/2015     Sig - Route: Take 1 capsule (60 mg total) by mouth 2 (two) times daily. - Oral    Notes to Pharmacy: Pt to call when ready to fill    E-Prescribing Status: Receipt confirmed by pharmacy (10/19/2015 2:05 PM EST)     Pharmacy    [id number] - [place] [address], [place].

## 2016-01-16 NOTE — Telephone Encounter (Signed)
ERROR

## 2016-02-05 ENCOUNTER — Encounter: Payer: Self-pay | Admitting: Cardiology

## 2016-04-10 ENCOUNTER — Encounter: Payer: Self-pay | Admitting: *Deleted

## 2016-04-26 ENCOUNTER — Encounter: Payer: Self-pay | Admitting: Cardiology

## 2016-04-26 ENCOUNTER — Ambulatory Visit (INDEPENDENT_AMBULATORY_CARE_PROVIDER_SITE_OTHER): Payer: Medicare Other | Admitting: Cardiology

## 2016-04-26 VITALS — BP 128/80 | HR 76 | Ht 67.5 in | Wt 206.6 lb

## 2016-04-26 DIAGNOSIS — I480 Paroxysmal atrial fibrillation: Secondary | ICD-10-CM

## 2016-04-26 DIAGNOSIS — I10 Essential (primary) hypertension: Secondary | ICD-10-CM | POA: Diagnosis not present

## 2016-04-26 DIAGNOSIS — Z7901 Long term (current) use of anticoagulants: Secondary | ICD-10-CM | POA: Diagnosis not present

## 2016-04-26 DIAGNOSIS — E785 Hyperlipidemia, unspecified: Secondary | ICD-10-CM | POA: Diagnosis not present

## 2016-04-26 NOTE — Patient Instructions (Signed)

## 2016-04-26 NOTE — Progress Notes (Signed)
Cardiology Office Note   Date:  04/26/2016   ID:  Shannon Liu, DOB 1941/03/31, MRN [id number]  PCP:  Shannon Shelling, MD  Cardiologist:   Shannon Furbish, MD     History of Present Illness: Shannon Liu is a 75 y.o. female seen on 06/23/15 with atrial fibrillation, rapid ventricular response here for follow-up. Prior to admission, approximately 4 days, her heart rate in the gym was 105 beats before exercising which was unusual for her. The night before admission she developed nausea. She developed some substernal chest discomfort mild. Cramping under left breast. The morning of admission she felt poor, could feel her heart racing. Blood pressure machine was not registering at first and then showed her heart rate at 137. Atrial fibrillation was discovered.  She was admitted, converted back to sinus rhythm on Cardizem drip. She was then placed on by mouth Lopressor. Her Chadsvasc score at least 2 (HTN and female sex). Was placed appropriately on anticoagulation  Echo 06/24/15 showed mildly reduced ejection fraction of 45-50% with moderate left jugular hypertrophy.  EKG did not show any acute ST segment changes.  Regarding her hypertension, her amlodipine was discontinued during the hospitalization as well as valsartan HCTZ 320/25. She was sent home on valsartan 160 mg a day and Lopressor 12.5 mg twice a day.   She had nausea as well as fatigue with metoprolol. She showed me her blood pressure and heart rate and her heart rate was quite low sometimes in the 50s. She believes that she has gotten a recording as low as 47 bpm.  Had previously taken atenolol and did fine.  Mother had CVA, grandmother CVA   Had arrhythmia for years. Palpitations.  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 Her brother has atrial fibrillation as well. He is only taking aspirin. She is very appreciative of Dr. Laurann Liu who helped her through her husband's death. She  remembers him calling on Sunday morning.  She thinks that her chest pain is more of a cramping. Fairly constant.  She also states that she was drinking some apple cider vinegar for 2 weeks prior to her atrial fibrillation episode. She wonder if this had an effect. She is no longer drinking.  08/07/15-her blood pressure was significantly elevated at home and her amlodipine was restarted at 5 mg. She is to take 10 mg but did notice some ankle swelling with this. Her blood pressure today is better at 150/88. Occasionally at home she will have blood pressure 120 range in the morning. Anxiety level still continues to be an issue at times. She sometimes will feel her heart skip but she has not had another episode to her knowledge of atrial fibrillation.  10/19/15-she has been doing very well. Will rarely have episodes of rapid heartbeat. She has had palpitations for several years. No shortness of breath, no chest pain, no syncope, no bleeding.  04/26/16-once again, she has been doing well. Occasionally she will have a dream about her husband who died 2 years ago and maybe have some palpitations but when she checks her pulse it feels normal. She wonders if this is anxiety. She has not had to take an extra diltiazem. No chest pain, no syncope, no bleeding. She was also wondering if the Eliquis was working because when she pricks herself on a rose bush for instance, she does not bleed freely. Reassurance.   Past Medical History  Diagnosis Date  . HTN (hypertension)  since pregnant at age 9  . Hyperlipemia   . HOH (hard of hearing)   . Heart murmur   . Arthritis   . PAF (paroxysmal atrial fibrillation) ([place])     a. dx on 06/2015 admission. spontaneously converted into NSR. placed on Eliquis    Past Surgical History  Procedure Laterality Date  . Neck surgery      discectomy with cadaver bone  . Abdominal hysterectomy    . Breast surgery Left     lumpectomy 35 yrs ago     Current Outpatient  Prescriptions  Medication Sig Dispense Refill  . acetaminophen (TYLENOL) 500 MG tablet Take 500 mg by mouth every 6 (six) hours as needed for mild pain.    Marland Kitchen ALPRAZolam (XANAX) 0.25 MG tablet Take 0.25 mg by mouth 2 (two) times daily as needed for anxiety.    Marland Kitchen amLODipine (NORVASC) 5 MG tablet Take 1 tablet (5 mg total) by mouth daily. 90 tablet 3  . atorvastatin (LIPITOR) 20 MG tablet Take 20 mg by mouth daily.    Marland Kitchen diltiazem (CARDIZEM SR) 60 MG 12 hr capsule Take 1 capsule (60 mg total) by mouth 2 (two) times daily. 180 capsule 3  . ELIQUIS 5 MG TABS tablet TAKE 1 TABLET BY MOUTH 2 TIMES DAILY 180 tablet 6  . Multiple Vitamins-Minerals (MULTIVITAMIN WITH MINERALS) tablet Take 1 tablet by mouth daily.    Marland Kitchen Propylene Glycol (SYSTANE BALANCE) 0.6 % SOLN Place 1 drop into both eyes at bedtime.     . valsartan-hydrochlorothiazide (DIOVAN-HCT) 160-12.5 MG tablet Take 1 tablet by mouth daily. 90 tablet 3   No current facility-administered medications for this visit.    Allergies:   Morphine and related and Penicillins    Social History:  The patient  reports that she has never smoked. She has never used smokeless tobacco. She reports that she does not drink alcohol or use illicit drugs.   Family History:  The patient's family history includes CVA (age of onset: 55) in her mother; Heart attack (age of onset: 13) in her mother; Hypertension in her brother and sister; Unexplained death (age of onset: 61) in her father.    ROS:  Please see the history of present illness.   Otherwise, review of systems are positive for none.   All other systems are reviewed and negative.    PHYSICAL EXAM: VS:  BP 128/80 mmHg  Pulse 76  Ht 5' 7.5" (1.715 m)  Wt 206 lb 9.6 oz (93.713 kg)  BMI 31.86 kg/m2 , BMI Body mass index is 31.86 kg/(m^2). GEN: Well nourished, well developed, in no acute distress HEENT: normal Neck: no JVD, carotid bruits, or masses Cardiac: RRR; no murmurs, rubs, or gallops,no edema    Respiratory:  clear to auscultation bilaterally, normal work of breathing GI: soft, nontender, nondistended, + BS overweight MS: no deformity or atrophy Skin: warm and dry, no rash Neuro:  Strength and sensation are intact Psych: euthymic mood, full affect   EKG: On 06/24/15 post auto conversion, she was 43 bpm. On day prior 06/23/15 when she was in atrial for ablation heart rate was as high as 170 bpm with repolarization abnormality.  Recent Labs: 06/23/2015: Magnesium 2.0; TSH 1.045 06/24/2015: ALT 19; BUN 13; Creatinine, Ser 0.72; Hemoglobin 12.6; Platelets 235; Potassium 3.9; Sodium 138    Lipid Panel    Component Value Date/Time   CHOL 142 06/24/2015 0329   TRIG 118 06/24/2015 0329   HDL 36* 06/24/2015 0329  CHOLHDL 3.9 06/24/2015 0329   VLDL 24 06/24/2015 0329   LDLCALC 82 06/24/2015 0329      Wt Readings from Last 3 Encounters:  04/26/16 206 lb 9.6 oz (93.713 kg)  10/19/15 205 lb 12.8 oz (93.35 kg)  08/07/15 201 lb 6.4 oz (91.354 kg)      Other studies Reviewed: Additional studies/ records that were reviewed today include: Prior hospitalization, lab, echo. Review of the above records demonstrates: As above  Nuclear stress test 07/06/15-low risk, normal ejection fraction, no ischemia  Echo 06/2015-EF 45-50% and mild mitral regurgitation noted.  ASSESSMENT AND PLAN:  1.  Paroxysmal atrial fibrillation  - This patients CHA2DS2-VASc Score and unadjusted Ischemic Stroke Rate (% per year) is equal to 2.2 % stroke rate/year from a score of 2. Will be 3 at age >79.  Above score calculated as 1 point each if present [CHF, HTN, DM, Vascular=MI/PAD/Aortic Plaque, Age if 65-74, or Female] Above score calculated as 2 points each if present [Age > 75, or Stroke/TIA/TE]    - Currently on Eliquis. Agree. No bleeding.  - Continue her diltiazem SR 60 mg twice a day. Can take extra PRN.  - If her heart rate remains quite low, we may need to use the diltiazem SR 60 mg on as-needed  basis. Remember, her atrial fibrillation heart rate was 170 bpm.  - Discussed stroke risk.  - Weight reduction is helpful with atrial fibrillation. She does not drink alcohol. Avoid stimulants.  2. Mildly reduced ejection fraction, left ventricular function-resolved  - Ejection fraction was reassuring on nuclear stress test, 70%. No ischemia, low risk.   3. Essential hypertension  - Continue combination valsartan/HCTZ 160/12.5. Her last potassium was 3.9. Creatinine 0.72. Hemoglobin 12.6. - She will continue with monitoring her blood pressure at home. Occasionally at morning hours her blood pressure being in the 120 range. She thinks that her anxiety during the day sometimes increases her blood pressure.  4. Hyperlipidemia-continue with low-dose atorvastatin 20 mg.  Current medicines are reviewed at length with the patient today.  The patient does not have concerns regarding medicines.  The following changes have been made:  Combination pill as above Labs/ tests ordered today include:  No orders of the defined types were placed in this encounter.     Disposition:   FU with Isak Sotomayor in 6 month  Signed, Shannon Furbish, MD  04/26/2016 10:11 AM    [facility] [place], [place], [place]  [address] Phone: [phone]; Fax: [phone]

## 2016-07-11 ENCOUNTER — Encounter: Payer: Self-pay | Admitting: Cardiology

## 2016-07-11 ENCOUNTER — Other Ambulatory Visit: Payer: Self-pay | Admitting: Internal Medicine

## 2016-07-11 DIAGNOSIS — Z1231 Encounter for screening mammogram for malignant neoplasm of breast: Secondary | ICD-10-CM

## 2016-08-01 ENCOUNTER — Ambulatory Visit: Payer: Medicare Other

## 2016-08-29 ENCOUNTER — Ambulatory Visit
Admission: RE | Admit: 2016-08-29 | Discharge: 2016-08-29 | Disposition: A | Discharge status: Home / Self Care | Payer: Medicare Other | Source: Ambulatory Visit | Attending: Internal Medicine | Admitting: Internal Medicine

## 2016-08-29 DIAGNOSIS — Z1231 Encounter for screening mammogram for malignant neoplasm of breast: Secondary | ICD-10-CM

## 2016-10-23 ENCOUNTER — Encounter: Payer: Self-pay | Admitting: Cardiology

## 2016-10-23 ENCOUNTER — Ambulatory Visit (INDEPENDENT_AMBULATORY_CARE_PROVIDER_SITE_OTHER): Payer: Medicare Other | Admitting: Cardiology

## 2016-10-23 VITALS — BP 136/84 | HR 72 | Ht 67.5 in | Wt 206.0 lb

## 2016-10-23 DIAGNOSIS — Z7901 Long term (current) use of anticoagulants: Secondary | ICD-10-CM

## 2016-10-23 DIAGNOSIS — I480 Paroxysmal atrial fibrillation: Secondary | ICD-10-CM

## 2016-10-23 DIAGNOSIS — E7800 Pure hypercholesterolemia, unspecified: Secondary | ICD-10-CM | POA: Diagnosis not present

## 2016-10-23 DIAGNOSIS — I10 Essential (primary) hypertension: Secondary | ICD-10-CM | POA: Diagnosis not present

## 2016-10-23 NOTE — Progress Notes (Signed)
Cardiology Office Note   Date:  10/23/2016   ID:  HAGAR ZANARDI, DOB Feb 23, 1941, MRN [id number]  PCP:  Irven Shelling, MD  Cardiologist:   Candee Furbish, MD     History of Present Illness: Shannon Liu is a 76 y.o. female seen on 06/23/15 with atrial fibrillation, rapid ventricular response here for follow-up. Prior to admission, approximately 4 days, her heart rate in the gym was 105 beats before exercising which was unusual for her. The night before admission she developed nausea. She developed some substernal chest discomfort mild. Cramping under left breast. The morning of admission she felt poor, could feel her heart racing. Blood pressure machine was not registering at first and then showed her heart rate at 137. Atrial fibrillation was discovered.  She was admitted, converted back to sinus rhythm on Cardizem drip. She was then placed on by mouth Lopressor. Her Chadsvasc score at least 2 (HTN and female sex). Was placed appropriately on anticoagulation  Echo 06/24/15 showed mildly reduced ejection fraction of 45-50% with moderate left jugular hypertrophy.  EKG did not show any acute ST segment changes.  Regarding her hypertension, her amlodipine was discontinued during the hospitalization as well as valsartan HCTZ 320/25. She was sent home on valsartan 160 mg a day and Lopressor 12.5 mg twice a day.   She had nausea as well as fatigue with metoprolol. She showed me her blood pressure and heart rate and her heart rate was quite low sometimes in the 50s. She believes that she has gotten a recording as low as 47 bpm.  Had previously taken atenolol and did fine.  Mother had CVA, grandmother CVA   Had arrhythmia for years. Palpitations.  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 Her brother has atrial fibrillation as well. He is only taking aspirin. She is very appreciative of Dr. Laurann Montana who helped her through her husband's death. She  remembers him calling on Sunday morning.  She thinks that her chest pain is more of a cramping. Fairly constant.  She also states that she was drinking some apple cider vinegar for 2 weeks prior to her atrial fibrillation episode. She wonder if this had an effect. She is no longer drinking.  08/07/15-her blood pressure was significantly elevated at home and her amlodipine was restarted at 5 mg. She is to take 10 mg but did notice some ankle swelling with this. Her blood pressure today is better at 150/88. Occasionally at home she will have blood pressure 120 range in the morning. Anxiety level still continues to be an issue at times. She sometimes will feel her heart skip but she has not had another episode to her knowledge of atrial fibrillation.  10/19/15-she has been doing very well. Will rarely have episodes of rapid heartbeat. She has had palpitations for several years. No shortness of breath, no chest pain, no syncope, no bleeding.  04/26/16-once again, she has been doing well. Occasionally she will have a dream about her husband who died 2 years ago and maybe have some palpitations but when she checks her pulse it feels normal. She wonders if this is anxiety. She has not had to take an extra diltiazem. No chest pain, no syncope, no bleeding. She was also wondering if the Eliquis was working because when she pricks herself on a rose bush for instance, she does not bleed freely. Reassurance.  10/23/16 -overall doing well, no chest pain, no shortness of breath,  no syncope, no bleeding. She will occasionally wake up early in the morning with palpitations but these subside. She has not needed to take an extra diltiazem. Husband died of cancer.    Past Medical History:  Diagnosis Date  . Arthritis   . Heart murmur   . HOH (hard of hearing)   . HTN (hypertension)    since pregnant at age 57  . Hyperlipemia   . PAF (paroxysmal atrial fibrillation) ([place])    a. dx on 06/2015 admission. spontaneously  converted into NSR. placed on Eliquis    Past Surgical History:  Procedure Laterality Date  . ABDOMINAL HYSTERECTOMY    . BREAST SURGERY Left    lumpectomy 35 yrs ago  . NECK SURGERY     discectomy with cadaver bone     Current Outpatient Prescriptions  Medication Sig Dispense Refill  . acetaminophen (TYLENOL) 500 MG tablet Take 500 mg by mouth every 6 (six) hours as needed for mild pain.    Marland Kitchen ALPRAZolam (XANAX) 0.25 MG tablet Take 0.25 mg by mouth 2 (two) times daily as needed for anxiety.    Marland Kitchen amLODipine (NORVASC) 5 MG tablet Take 1 tablet (5 mg total) by mouth daily. 90 tablet 3  . atorvastatin (LIPITOR) 20 MG tablet Take 20 mg by mouth daily.    Marland Kitchen diltiazem (CARDIZEM SR) 60 MG 12 hr capsule Take 1 capsule (60 mg total) by mouth 2 (two) times daily. 180 capsule 3  . ELIQUIS 5 MG TABS tablet TAKE 1 TABLET BY MOUTH 2 TIMES DAILY 180 tablet 6  . Multiple Vitamins-Minerals (MULTIVITAMIN WITH MINERALS) tablet Take 1 tablet by mouth daily.    Marland Kitchen Propylene Glycol (SYSTANE BALANCE) 0.6 % SOLN Place 1 drop into both eyes at bedtime.     . valsartan-hydrochlorothiazide (DIOVAN-HCT) 160-12.5 MG tablet Take 1 tablet by mouth daily. 90 tablet 3   No current facility-administered medications for this visit.     Allergies:   Morphine and related and Penicillins    Social History:  The patient  reports that she has never smoked. She has never used smokeless tobacco. She reports that she does not drink alcohol or use drugs.   Family History:  The patient's family history includes CVA (age of onset: 18) in her mother; Heart attack (age of onset: 63) in her mother; Hypertension in her brother and sister; Unexplained death (age of onset: 39) in her father.    ROS:  Please see the history of present illness.   Otherwise, review of systems are positive for none.   All other systems are reviewed and negative.    PHYSICAL EXAM: VS:  BP 136/84 (BP Location: Left Arm, Patient Position: Sitting, Cuff  Size: Large)   Pulse 72   Ht 5' 7.5" (1.715 m)   Wt 206 lb (93.4 kg)   BMI 31.79 kg/m  , BMI Body mass index is 31.79 kg/m. GEN: Well nourished, well developed, in no acute distress  HEENT: normal  Neck: no JVD, carotid bruits, or masses Cardiac: RRR; no murmurs, rubs, or gallops,no edema  Respiratory:  clear to auscultation bilaterally, normal work of breathing GI: soft, nontender, nondistended, + BS overweight MS: no deformity or atrophy  Skin: warm and dry, no rash Neuro:  Strength and sensation are intact Psych: euthymic mood, full affect   EKG: On 06/24/15 post auto conversion, she was 43 bpm. On day prior 06/23/15 when she was in atrial for ablation heart rate was as high as 170 bpm  with repolarization abnormality.  Recent Labs: No results found for requested labs within last 8760 hours.    Lipid Panel    Component Value Date/Time   CHOL 142 06/24/2015 0329   TRIG 118 06/24/2015 0329   HDL 36 (L) 06/24/2015 0329   CHOLHDL 3.9 06/24/2015 0329   VLDL 24 06/24/2015 0329   LDLCALC 82 06/24/2015 0329      Wt Readings from Last 3 Encounters:  10/23/16 206 lb (93.4 kg)  04/26/16 206 lb 9.6 oz (93.7 kg)  10/19/15 205 lb 12.8 oz (93.4 kg)      Other studies Reviewed: Additional studies/ records that were reviewed today include: Prior hospitalization, lab, echo. Review of the above records demonstrates: As above  Nuclear stress test 07/06/15-low risk, normal ejection fraction, no ischemia  Echo 06/2015-EF 45-50% and mild mitral regurgitation noted.  ASSESSMENT AND PLAN:  1.  Paroxysmal atrial fibrillation  - This patients CHA2DS2-VASc Score and unadjusted Ischemic Stroke Rate (% per year) is equal to 2.2 % stroke rate/year from a score of 2. Will be 3 at age >65.  Above score calculated as 1 point each if present [CHF, HTN, DM, Vascular=MI/PAD/Aortic Plaque, Age if 65-74, or Female] Above score calculated as 2 points each if present [Age > 75, or Stroke/TIA/TE]     - Currently on Eliquis. Agree. No bleeding.  - Continue her diltiazem SR 60 mg twice a day. Can take extra PRN.  - If her heart rate remains quite low, we may need to use the diltiazem SR 60 mg on as-needed basis. Remember, her atrial fibrillation heart rate was 170 bpm.  - Discussed stroke risk.  - Weight reduction is helpful with atrial fibrillation. She does not drink alcohol. Avoid stimulants.No changes made.  2. Mildly reduced ejection fraction, left ventricular function-resolved  - Ejection fraction was reassuring on nuclear stress test, 70%. No ischemia, low risk.   3. Essential hypertension  - Continue combination valsartan/HCTZ 160/12.5. Her last potassium was 3.9. Creatinine 0.72. Hemoglobin 12.6. - She will continue with monitoring her blood pressure at home. Occasionally at morning hours her blood pressure being in the 120 range. She thinks that her anxiety during the day sometimes increases her blood pressure.  4. Hyperlipidemia-continue with low-dose atorvastatin 20 mg.  Current medicines are reviewed at length with the patient today.  The patient does not have concerns regarding medicines.  The following changes have been made:    Labs/ tests ordered today include:  Orders Placed This Encounter  Procedures  . Basic metabolic panel  . CBC  . EKG 12-Lead     Disposition:   FU  in 6 month  Signed, Candee Furbish, MD  10/23/2016 10:13 AM    [facility] [address], [place], [place]  [address] Phone: [phone]; Fax: [phone]

## 2016-10-23 NOTE — Patient Instructions (Signed)
Medication Instructions:  The current medical regimen is effective;  continue present plan and medications.  Labwork: Please have blood work today (CBC, BMP)  Follow-Up: Follow up in 6 months with Bonney Leitz, PA.  You will receive a letter in the mail 2 months before you are due.  Please call us when you receive this letter to schedule your follow up appointment.  If you need a refill on your cardiac medications before your next appointment, please call your pharmacy.  Thank you for choosing [facility]!!

## 2016-10-24 LAB — BASIC METABOLIC PANEL
BUN / CREAT RATIO: 18 (ref 12–28)
BUN: 13 mg/dL (ref 8–27)
CO2: 23 mmol/L (ref 18–29)
CREATININE: 0.73 mg/dL (ref 0.57–1.00)
Calcium: 9.5 mg/dL (ref 8.7–10.3)
Chloride: 100 mmol/L (ref 96–106)
GFR calc Af Amer: 93 mL/min/{1.73_m2} (ref >59)
GFR calc non Af Amer: 81 mL/min/{1.73_m2} (ref >59)
GLUCOSE: 127 mg/dL — AB (ref 65–99)
Potassium: 3.8 mmol/L (ref 3.5–5.2)
Sodium: 143 mmol/L (ref 134–144)

## 2016-10-24 LAB — CBC
Hematocrit: 41 % (ref 34.0–46.6)
Hemoglobin: 14 g/dL (ref 11.1–15.9)
MCH: 30.2 pg (ref 26.6–33.0)
MCHC: 34.1 g/dL (ref 31.5–35.7)
MCV: 88 fL (ref 79–97)
Platelets: 285 10*3/uL (ref 150–379)
RBC: 4.64 x10E6/uL (ref 3.77–5.28)
RDW: 13.6 % (ref 12.3–15.4)
WBC: 6.4 10*3/uL (ref 3.4–10.8)

## 2016-12-21 ENCOUNTER — Other Ambulatory Visit: Payer: Self-pay | Admitting: Cardiology

## 2017-01-18 ENCOUNTER — Other Ambulatory Visit: Payer: Self-pay | Admitting: Cardiology

## 2017-03-20 ENCOUNTER — Other Ambulatory Visit: Payer: Self-pay | Admitting: Cardiology

## 2017-03-26 ENCOUNTER — Other Ambulatory Visit: Payer: Self-pay | Admitting: Cardiology

## 2017-04-02 ENCOUNTER — Encounter: Payer: Self-pay | Admitting: Physician Assistant

## 2017-04-19 NOTE — Progress Notes (Signed)
Cardiology Office Note    Date:  04/21/2017   ID:  Shannon Liu, DOB 08-28-1941, MRN [id number]  PCP:  Lavone Orn, MD  Cardiologist:  Dr. Marlou Porch  CC: follow up  History of Present Illness:  Shannon Liu is a 76 y.o. female with a history of PAF on Eliquis, HTN, HLD, and obesity who presents to clinic for follow up.  She was diagnosed with afib with RVR (HR 170) in 06/2015. She was admitted and spontaneously converted on diltiazem. 2D ECHO (06/24/15) during this admission showed LV EF 45-50% with mod LVH, mild MR. However, myoview (07/05/17) showed EF 70% with no ischemia. She has been on Eliquis for a CHADSVASC of at least 4 ( HTN, age (2), Female). She was treated with metoprolol but did not tolerate this. She has done well on diltiazem but has had some sinus bradycardia with HR in high 40s and 50s. She was last seen by Dr. Marlou Porch in 10/2016 and doing well. Maintaining NSR. CBC and BMET were stable. BG elevated. Instructed to loose weight.   Today she presents to clinic for follow up alone. She has been doing well without any recent feeling of being in atrial fib. She has never has to use an extra dose of diltiazem. She says that she has felt better over the last 2 years while on cardizem than she has in previous 10 years. She never misses a dose. She exercises 1-2 days per week at the [facility] with [employer]neakers, doing 30 minutes of aerobics and walking on the treadmill. Her daughter is living with her now and they are fairly active. Yesterday they went on a 2 mile hike. She has no exertional chest discomfort or dyspnea. She also denies lightheadedness, orthopnea, PND or significant edema. She has had no unusual bleeding. She checks her blood pressure at home about 2-3 times per week and it has been running 110-120/60's.  She has foot pain on the ball of her left foot and she plans to see Dr. Laurann Montana.    Past Medical History:  Diagnosis Date  . Arthritis   . Heart murmur   . HOH (hard of  hearing)   . HTN (hypertension)    since pregnant at age 10  . Hyperlipemia   . PAF (paroxysmal atrial fibrillation) ([place])    a. dx on 06/2015 admission. spontaneously converted into NSR. placed on Eliquis    Past Surgical History:  Procedure Laterality Date  . ABDOMINAL HYSTERECTOMY    . BREAST SURGERY Left    lumpectomy 35 yrs ago  . NECK SURGERY     discectomy with cadaver bone    Current Medications: Outpatient Medications Prior to Visit  Medication Sig Dispense Refill  . acetaminophen (TYLENOL) 500 MG tablet Take 500 mg by mouth every 6 (six) hours as needed for mild pain.    Marland Kitchen ALPRAZolam (XANAX) 0.25 MG tablet Take 0.25 mg by mouth 2 (two) times daily as needed for anxiety.    Marland Kitchen amLODipine (NORVASC) 5 MG tablet TAKE 1 TABLET (5 MG TOTAL) BY MOUTH DAILY. 90 tablet 1  . atorvastatin (LIPITOR) 20 MG tablet Take 20 mg by mouth daily.    Marland Kitchen diltiazem (CARDIZEM SR) 60 MG 12 hr capsule TAKE 1 CAPSULE (60 MG TOTAL) BY MOUTH 2 (TWO) TIMES DAILY. 180 capsule 2  . ELIQUIS 5 MG TABS tablet TAKE 1 TABLET BY MOUTH 2 TIMES DAILY 180 tablet 1  . Multiple Vitamins-Minerals (MULTIVITAMIN WITH MINERALS) tablet Take 1 tablet  by mouth daily.    Marland Kitchen Propylene Glycol (SYSTANE BALANCE) 0.6 % SOLN Place 1 drop into both eyes at bedtime.     . valsartan-hydrochlorothiazide (DIOVAN-HCT) 160-12.5 MG tablet TAKE 1 TABLET BY MOUTH DAILY. 90 tablet 2   No facility-administered medications prior to visit.      Allergies:   Morphine and related and Penicillins   Social History   Social History  . Marital status: Widowed    Spouse name: N/A  . Number of children: N/A  . Years of education: N/A   Occupational History  . Retired    Social History Main Topics  . Smoking status: Never Smoker  . Smokeless tobacco: Never Used  . Alcohol use No  . Drug use: No  . Sexual activity: Not Asked   Other Topics Concern  . None   Social History Narrative   Lives [place], near [place].  Daughter currently lives with her.     Family History:  The patient's family history includes CVA (age of onset: 5) in her mother; Heart attack (age of onset: 51) in her mother; Hypertension in her brother and sister; Unexplained death (age of onset: 56) in her father.      ROS: Please see the history of present illness.     All other systems reviewed and are negative  PE: Physical Exam  Constitutional: She is oriented to person, place, and time. She appears well-developed and well-nourished. No distress.  HENT:  Head: Normocephalic and atraumatic.  Eyes: No scleral icterus.  Neck: Normal range of motion. No JVD present.  Cardiovascular: Normal rate, regular rhythm and normal heart sounds.   Pulmonary/Chest: Effort normal and breath sounds normal. No respiratory distress. She has no wheezes. She has no rales.  Abdominal: Soft. Bowel sounds are normal. She exhibits no distension. There is no tenderness.  Musculoskeletal: Normal range of motion. She exhibits no edema or deformity.  Neurological: She is alert and oriented to person, place, and time.  Skin: Skin is warm and dry.  Psychiatric: She has a normal mood and affect. Her behavior is normal.     Wt Readings from Last 3 Encounters:  04/21/17 208 lb 12.8 oz (94.7 kg)  10/23/16 206 lb (93.4 kg)  04/26/16 206 lb 9.6 oz (93.7 kg)      Studies/Labs Reviewed:   EKG:  EKG is ordered today.  The ekg ordered today demonstrates Sinus arrhythmia with occ PVC, 73 bpm. QTC 425  Recent Labs: 10/23/2016: BUN 13; Creatinine, Ser 0.73; Hemoglobin 14.0; Platelets 285; Potassium 3.8; Sodium 143   Lipid Panel    Component Value Date/Time   CHOL 142 06/24/2015 0329   TRIG 118 06/24/2015 0329   HDL 36 (L) 06/24/2015 0329   CHOLHDL 3.9 06/24/2015 0329   VLDL 24 06/24/2015 0329   LDLCALC 82 06/24/2015 0329    Additional studies/ records that were reviewed today include:  2D ECHO: 06/24/2015 LV EF: 45% -   50% Study Conclusions -  Left ventricle: The cavity size was normal. Wall thickness was   increased in a pattern of moderate LVH. Systolic function was   mildly reduced. The estimated ejection fraction was in the range   of 45% to 50%. Regional wall motion abnormalities cannot be   excluded. Doppler parameters are consistent with abnormal left   ventricular relaxation (grade 1 diastolic dysfunction). - Mitral valve: There was mild regurgitation. - Atrial septum: No defect or patent foramen ovale was identified. - Pulmonary arteries: PA  peak pressure: 31 mm Hg (S).  Myoview: 07/06/15 Notes Recorded by Jerline Pain, MD on 07/06/2015 at 5:09 PM   Nuclear stress EF: 70%.   There was no ST segment deviation noted during stress.   The study is normal. No ischemia. No evidence of infarction   This is a low risk study.  ASSESSMENT & PLAN:   PAF: Maintaining sinus rhythm with no perceived palpitations or afib over the last 6 months. Is tolerating diltiazem well. Compliant with Eliquis with no unusual bleeding. Will check CBC.   HTN: Blood pressure well controlled here and per home BP readings. Continue current therapy. Will check BMet.   LV dysfunction: resolved.  EF 45-50% by echo, 06/2015 but 70% by nuc 06/2017. No S/S of heart failure.   HLD: Treated with atorvastatin and tolerating well. Pt states that her PCP is following her lipid panel (not in our system).   Medication Adjustments/Labs and Tests Ordered: Current medicines are reviewed at length with the patient today.  Concerns regarding medicines are outlined above.  Medication changes, Labs and Tests ordered today are listed in the Patient Instructions below. Patient Instructions  Medication Instructions:  Your physician recommends that you continue on your current medications as directed. Please refer to the Current Medication list given to you today.  Labwork: TODAY:  BMET & CBC  Testing/Procedures: None ordered  Follow-Up: Your physician  wants you to follow-up in: [place] DR. Marlou Porch   You will receive a reminder letter in the mail two months in advance. If you don't receive a letter, please call our office to schedule the follow-up appointment.    Any Other Special Instructions Will Be Listed Below (If Applicable).   If you need a refill on your cardiac medications before your next appointment, please call your pharmacy.      Tildon Husky, AGNP-C 04/21/2017  [address], [place], [place]  [address] Phone: [phone]; Fax: [phone]

## 2017-04-21 ENCOUNTER — Ambulatory Visit (INDEPENDENT_AMBULATORY_CARE_PROVIDER_SITE_OTHER): Payer: Medicare Other | Admitting: Cardiology

## 2017-04-21 ENCOUNTER — Encounter: Payer: Self-pay | Admitting: Cardiology

## 2017-04-21 VITALS — BP 130/80 | HR 73 | Ht 67.5 in | Wt 208.8 lb

## 2017-04-21 DIAGNOSIS — E7800 Pure hypercholesterolemia, unspecified: Secondary | ICD-10-CM | POA: Diagnosis not present

## 2017-04-21 DIAGNOSIS — I480 Paroxysmal atrial fibrillation: Secondary | ICD-10-CM | POA: Diagnosis not present

## 2017-04-21 DIAGNOSIS — I10 Essential (primary) hypertension: Secondary | ICD-10-CM

## 2017-04-21 LAB — CBC
HEMOGLOBIN: 14.4 g/dL (ref 11.1–15.9)
Hematocrit: 43 % (ref 34.0–46.6)
MCH: 29 pg (ref 26.6–33.0)
MCHC: 33.5 g/dL (ref 31.5–35.7)
MCV: 87 fL (ref 79–97)
Platelets: 275 10*3/uL (ref 150–379)
RBC: 4.96 x10E6/uL (ref 3.77–5.28)
RDW: 13.8 % (ref 12.3–15.4)
WBC: 8.1 10*3/uL (ref 3.4–10.8)

## 2017-04-21 LAB — BASIC METABOLIC PANEL
BUN/Creatinine Ratio: 22 (ref 12–28)
BUN: 16 mg/dL (ref 8–27)
CALCIUM: 9.8 mg/dL (ref 8.7–10.3)
CHLORIDE: 104 mmol/L (ref 96–106)
CO2: 23 mmol/L (ref 20–29)
Creatinine, Ser: 0.74 mg/dL (ref 0.57–1.00)
GFR calc Af Amer: 92 mL/min/{1.73_m2} (ref >59)
GFR, EST NON AFRICAN AMERICAN: 80 mL/min/{1.73_m2} (ref >59)
Glucose: 122 mg/dL — ABNORMAL HIGH (ref 65–99)
POTASSIUM: 4.2 mmol/L (ref 3.5–5.2)
Sodium: 142 mmol/L (ref 134–144)

## 2017-04-21 NOTE — Patient Instructions (Addendum)
Medication Instructions:  Your physician recommends that you continue on your current medications as directed. Please refer to the Current Medication list given to you today.  Labwork: TODAY:  BMET & CBC  Testing/Procedures: None ordered  Follow-Up: Your physician wants you to follow-up in: [place] DR. Marlou Porch   You will receive a reminder letter in the mail two months in advance. If you don't receive a letter, please call our office to schedule the follow-up appointment.    Any Other Special Instructions Will Be Listed Below (If Applicable).   If you need a refill on your cardiac medications before your next appointment, please call your pharmacy.

## 2017-07-15 ENCOUNTER — Other Ambulatory Visit: Payer: Self-pay | Admitting: Internal Medicine

## 2017-07-15 DIAGNOSIS — Z1231 Encounter for screening mammogram for malignant neoplasm of breast: Secondary | ICD-10-CM

## 2017-09-01 ENCOUNTER — Ambulatory Visit
Admission: RE | Admit: 2017-09-01 | Discharge: 2017-09-01 | Disposition: A | Discharge status: Home / Self Care | Payer: Medicare Other | Source: Ambulatory Visit | Attending: Internal Medicine | Admitting: Internal Medicine

## 2017-09-01 DIAGNOSIS — Z1231 Encounter for screening mammogram for malignant neoplasm of breast: Secondary | ICD-10-CM

## 2017-09-19 ENCOUNTER — Other Ambulatory Visit: Payer: Self-pay | Admitting: Cardiology

## 2017-09-19 NOTE — Telephone Encounter (Signed)
Pt is 76 yrs old, Wt-94.7kg, last seen by Daune Perch on 04/21/17, Crea 0.74 on 04/21/17; refill request sent to requested Pharmacy.

## 2017-10-20 ENCOUNTER — Other Ambulatory Visit: Payer: Self-pay | Admitting: Cardiology

## 2017-10-20 MED ORDER — AMLODIPINE BESYLATE 5 MG PO TABS: 5.0000 mg | ORAL_TABLET | Freq: Every day | ORAL | 1 refills | Status: DC

## 2017-10-20 MED ORDER — VALSARTAN-HYDROCHLOROTHIAZIDE 160-12.5 MG PO TABS: 1.0000 | ORAL_TABLET | Freq: Every day | ORAL | 1 refills | Status: DC

## 2017-10-20 MED ORDER — APIXABAN 5 MG PO TABS: 5.0000 mg | ORAL_TABLET | Freq: Two times a day (BID) | ORAL | 1 refills | Status: DC

## 2017-10-20 MED ORDER — DILTIAZEM HCL ER 60 MG PO CP12: 60.0000 mg | ORAL_CAPSULE | Freq: Two times a day (BID) | ORAL | 1 refills | Status: DC

## 2017-10-20 NOTE — Telephone Encounter (Signed)
Pt's pharmacy [facility] requesting that pt's medication of Eliquis be sent to their pharmacy. Please address

## 2017-10-20 NOTE — Telephone Encounter (Signed)
[facility]

## 2017-10-22 ENCOUNTER — Telehealth: Payer: Self-pay | Admitting: Cardiology

## 2017-10-22 MED ORDER — DILTIAZEM HCL ER COATED BEADS 120 MG PO CP24: 120.0000 mg | ORAL_CAPSULE | Freq: Every day | ORAL | 3 refills | Status: DC

## 2017-10-22 NOTE — Telephone Encounter (Signed)
Spoke with patient who reports she is unable to obtain Diltiazem d/t a [facility] surgeon and requested a different medication.  Advised I will review with Dr Marlou Porch and c/b with recommendations.  She was not able to take Metoprolol in the past and has never tried long acting Diltiazem.  Would like RX sent into [facility].

## 2017-10-22 NOTE — Telephone Encounter (Signed)
New message     Patient calling with concerns of medication being out of stock at local pharmacy and mail order. Please call  Pt c/o medication issue:  1. Name of Medication: diltiazem (CARDIZEM SR) 60 MG 12 hr capsule  2. How are you currently taking this medication (dosage and times per day)? As written  3. Are you having a reaction (difficulty breathing--STAT)? No  4. What is your medication issue? Medication is out of stock, need alternative med

## 2017-10-22 NOTE — Telephone Encounter (Signed)
Pt aware and agreeable.  

## 2017-10-22 NOTE — Telephone Encounter (Signed)
Reviewed information with Dr Marlou Porch who gave orders for Cardizem CD 120 mg once a day.

## 2017-11-05 ENCOUNTER — Telehealth: Payer: Self-pay | Admitting: Cardiology

## 2017-11-05 NOTE — Telephone Encounter (Signed)
Pt received a letter from her pharmacy stating she needs to check the lot number on her medication bottle to determine if her med is affected.  Advised her to call the pharmacy since she is unable to located the lot # on her bottle.  Advised they should supply her with the appropriate replacement and if necessary they should contact the office for a different medication.  Pt states understanding.

## 2017-11-05 NOTE — Telephone Encounter (Signed)
New Message  Pt c/o medication issue:  1. Name of Medication: Valsartan   2. How are you currently taking this medication (dosage and times per day)? 160-12.5mg   3. Are you having a reaction (difficulty breathing--STAT)? no  4. What is your medication issue? recall

## 2017-11-28 ENCOUNTER — Telehealth: Payer: Self-pay | Admitting: Cardiology

## 2017-11-28 NOTE — Telephone Encounter (Signed)
Patient stated that she was switched to diltiazem 120 mg due to 60 mg not being available. Patient said that the 120 mg is causing her to have nausea and she wants to switch back to the 60 mg since it was now available. Patient said that [facility] would be sending this request to [place] by fax. Patient advised that we would await this request.

## 2017-11-28 NOTE — Telephone Encounter (Signed)
Shannon Liu is calling her Diltiazem , She is needing to speak to someone about it .  Please call .Marland Kitchen  Thanks

## 2018-03-04 ENCOUNTER — Ambulatory Visit: Payer: Medicare Other | Admitting: Cardiology

## 2018-03-13 ENCOUNTER — Other Ambulatory Visit: Payer: Self-pay | Admitting: Cardiology

## 2018-03-13 NOTE — Telephone Encounter (Signed)
Age 77 years Wt 94.7kg 04/21/2017 Saw Shannon Liu on 04/21/2017 Has an appt to see Dr Shannon Liu 03/25/2018 04/21/2017 Hgb 14.4 HCT 43  SrCr 0.74 Refill done for Eliquis 5mg  q 12 hours as requested

## 2018-03-25 ENCOUNTER — Ambulatory Visit: Payer: Medicare Other | Admitting: Cardiology

## 2018-03-25 ENCOUNTER — Encounter

## 2018-03-25 ENCOUNTER — Encounter: Payer: Self-pay | Admitting: Cardiology

## 2018-03-25 VITALS — BP 142/86 | HR 65 | Ht 67.5 in | Wt 208.4 lb

## 2018-03-25 DIAGNOSIS — I10 Essential (primary) hypertension: Secondary | ICD-10-CM | POA: Diagnosis not present

## 2018-03-25 DIAGNOSIS — Z7901 Long term (current) use of anticoagulants: Secondary | ICD-10-CM

## 2018-03-25 DIAGNOSIS — I480 Paroxysmal atrial fibrillation: Secondary | ICD-10-CM | POA: Diagnosis not present

## 2018-03-25 NOTE — Patient Instructions (Signed)

## 2018-03-25 NOTE — Progress Notes (Signed)
Cardiology Office Note   Date:  03/25/2018   ID:  KENNETHA PEARMAN, DOB [birth year], MRN [id number]  PCP:  Lavone Orn, MD  Cardiologist:   Candee Furbish, MD     History of Present Illness: Shannon Liu is a 77 y.o. female seen on 06/23/15 with atrial fibrillation, rapid ventricular response here for follow-up. Prior to admission, approximately 4 days, her heart rate in the gym was 105 beats before exercising which was unusual for her. The night before admission she developed nausea. She developed some substernal chest discomfort mild. Cramping under left breast. The morning of admission she felt poor, could feel her heart racing. Blood pressure machine was not registering at first and then showed her heart rate at 137. Atrial fibrillation was discovered.  She was admitted, converted back to sinus rhythm on Cardizem drip. She was then placed on by mouth Lopressor. Her Chadsvasc score at least 2 (HTN and female sex). Was placed appropriately on anticoagulation  Echo 06/24/15 showed mildly reduced ejection fraction of 45-50% with moderate left jugular hypertrophy.  EKG did not show any acute ST segment changes.  Regarding her hypertension, her amlodipine was discontinued during the hospitalization as well as valsartan HCTZ 320/25. She was sent home on valsartan 160 mg a day and Lopressor 12.5 mg twice a day.   She had nausea as well as fatigue with metoprolol. She showed me her blood pressure and heart rate and her heart rate was quite low sometimes in the 50s. She believes that she has gotten a recording as low as 47 bpm.  Had previously taken atenolol and did fine.  Mother had CVA, grandmother CVA   Had arrhythmia for years. Palpitations.  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 Her brother has atrial fibrillation as well. He is only taking aspirin. She is very appreciative of Dr. Laurann Montana who helped her through her husband's death. She  remembers him calling on Sunday morning.  She thinks that her chest pain is more of a cramping. Fairly constant.  She also states that she was drinking some apple cider vinegar for 2 weeks prior to her atrial fibrillation episode. She wonder if this had an effect. She is no longer drinking.  08/07/15-her blood pressure was significantly elevated at home and her amlodipine was restarted at 5 mg. She is to take 10 mg but did notice some ankle swelling with this. Her blood pressure today is better at 150/88. Occasionally at home she will have blood pressure 120 range in the morning. Anxiety level still continues to be an issue at times. She sometimes will feel her heart skip but she has not had another episode to her knowledge of atrial fibrillation.  10/19/15-she has been doing very well. Will rarely have episodes of rapid heartbeat. She has had palpitations for several years. No shortness of breath, no chest pain, no syncope, no bleeding.  04/26/16-once again, she has been doing well. Occasionally she will have a dream about her husband who died 2 years ago and maybe have some palpitations but when she checks her pulse it feels normal. She wonders if this is anxiety. She has not had to take an extra diltiazem. No chest pain, no syncope, no bleeding. She was also wondering if the Eliquis was working because when she pricks herself on a rose bush for instance, she does not bleed freely. Reassurance.  10/23/16 -overall doing well, no chest pain, no shortness of breath,  no syncope, no bleeding. She will occasionally wake up early in the morning with palpitations but these subside. She has not needed to take an extra diltiazem. Husband died of cancer.   2018-04-09 - rare flutters in AM.  Overall has been doing quite well.  Consolidated her diltiazem to once a day 120.  She has not had any episodes of atrial fibrillation recently.  Blood pressure at home has been under good control/at [facility] [employer].  No syncope bleeding.   She feels less bleeding with Eliquis than she did with aspirin.  Blood work reviewed.  Past Medical History:  Diagnosis Date  . Arthritis   . Heart murmur   . HOH (hard of hearing)   . HTN (hypertension)    since pregnant at age 45  . Hyperlipemia   . PAF (paroxysmal atrial fibrillation) ([place])    a. dx on 06/2015 admission. spontaneously converted into NSR. placed on Eliquis    Past Surgical History:  Procedure Laterality Date  . ABDOMINAL HYSTERECTOMY    . BREAST CYST EXCISION Left   . BREAST SURGERY Left    lumpectomy 35 yrs ago  . NECK SURGERY     discectomy with cadaver bone     Current Outpatient Medications  Medication Sig Dispense Refill  . acetaminophen (TYLENOL) 500 MG tablet Take 500 mg by mouth every 6 (six) hours as needed for mild pain.    Marland Kitchen ALPRAZolam (XANAX) 0.25 MG tablet Take 0.25 mg by mouth 2 (two) times daily as needed for anxiety.    Marland Kitchen amLODipine (NORVASC) 5 MG tablet Take 1 tablet (5 mg total) by mouth daily. 90 tablet 1  . atorvastatin (LIPITOR) 20 MG tablet Take 20 mg by mouth daily.    Marland Kitchen diltiazem (CARDIZEM CD) 120 MG 24 hr capsule Take 1 capsule (120 mg total) by mouth daily. 90 capsule 3  . ELIQUIS 5 MG TABS tablet TAKE 1 TABLET BY MOUTH TWO  TIMES DAILY 180 tablet 1  . Multiple Vitamins-Minerals (MULTIVITAMIN WITH MINERALS) tablet Take 1 tablet by mouth daily.    Marland Kitchen Propylene Glycol (SYSTANE BALANCE) 0.6 % SOLN Place 1 drop into both eyes at bedtime.     . valsartan-hydrochlorothiazide (DIOVAN-HCT) 160-12.5 MG tablet Take 1 tablet by mouth daily. 90 tablet 1   No current facility-administered medications for this visit.     Allergies:   Morphine and related and Penicillins    Social History:  The patient  reports that she has never smoked. She has never used smokeless tobacco. She reports that she does not drink alcohol or use drugs.   Family History:  The patient's family history includes CVA (age of onset: 52) in her mother; Heart attack (age  of onset: 53) in her mother; Hypertension in her brother and sister; Unexplained death (age of onset: 43) in her father.    ROS:  Please see the history of present illness.  All other ROS negative   PHYSICAL EXAM: VS:  BP (!) 142/86   Pulse 65   Ht 5' 7.5" (1.715 m)   Wt 208 lb 6.4 oz (94.5 kg)   BMI 32.16 kg/m  , BMI Body mass index is 32.16 kg/m. GEN: Well nourished, well developed, in no acute distress  HEENT: normal  Neck: no JVD, carotid bruits, or masses Cardiac: RRR; no murmurs, rubs, or gallops,no edema  Respiratory:  clear to auscultation bilaterally, normal work of breathing GI: soft, nontender, nondistended, + BS overweight MS: no deformity or atrophy  Skin:  warm and dry, no rash Neuro:  Strength and sensation are intact Psych: euthymic mood, full affect   EKG: 03/25/2018-sinus rhythm 65 mild sinus arrhythmia, nonspecific ST-T wave changes.  On 06/24/15 post auto conversion, she was 43 bpm. On day prior 06/23/15 when she was in atrial for ablation heart rate was as high as 170 bpm with repolarization abnormality.  Recent Labs: 04/21/2017: BUN 16; Creatinine, Ser 0.74; Hemoglobin 14.4; Platelets 275; Potassium 4.2; Sodium 142    Lipid Panel    Component Value Date/Time   CHOL 142 06/24/2015 0329   TRIG 118 06/24/2015 0329   HDL 36 (L) 06/24/2015 0329   CHOLHDL 3.9 06/24/2015 0329   VLDL 24 06/24/2015 0329   LDLCALC 82 06/24/2015 0329      Wt Readings from Last 3 Encounters:  03/25/18 208 lb 6.4 oz (94.5 kg)  04/21/17 208 lb 12.8 oz (94.7 kg)  10/23/16 206 lb (93.4 kg)      Other studies Reviewed: Additional studies/ records that were reviewed today include: Prior hospitalization, lab, echo. Review of the above records demonstrates: As above  Nuclear stress test 07/06/15-low risk, normal ejection fraction, no ischemia  Echo 06/2015-EF 45-50% and mild mitral regurgitation noted.  ASSESSMENT AND PLAN:  1.  Paroxysmal atrial fibrillation  - This patients  CHA2DS2-VASc Score and unadjusted Ischemic Stroke Rate (% per year) is equal to 2.2 % stroke rate/year from a score of 2. Will be 3 at age >41.  Above score calculated as 1 point each if present [CHF, HTN, DM, Vascular=MI/PAD/Aortic Plaque, Age if 65-74, or Female] Above score calculated as 2 points each if present [Age > 75, or Stroke/TIA/TE]    - Currently on Eliquis. Agree. No bleeding.  - Continue her diltiazem CD 120 mg once a day taking at night. Can take extra PRN.  (Used to be on 60 twice a day of SR but could not find the medications easily.)  - Remember, her atrial fibrillation heart rate was 170 bpm.  - Discussed stroke risk.  - Weight reduction is helpful with atrial fibrillation. She does not drink alcohol. Avoid stimulants.No changes made.  Hemoglobin 14.4, creatinine 0.75.  2. Mildly reduced ejection fraction, left ventricular function-resolved  - Ejection fraction was reassuring on nuclear stress test, 70%. No ischemia, low risk.   3. Essential hypertension  -I do realize that she is on amlodipine as well as Cardizem.  Both have different mechanisms of action.  I am fine with her continuing with this regimen.  4. Hyperlipidemia-continue with low-dose atorvastatin 20 mg.  No myalgias, LDL 93.  Current medicines are reviewed at length with the patient today.  The patient does not have concerns regarding medicines.  The following changes have been made:    Labs/ tests ordered today include:  Orders Placed This Encounter  Procedures  . EKG 12-Lead     Disposition:   FU  in 12 month  Signed, Candee Furbish, MD  03/25/2018 10:50 AM    [facility] [place], [place], [place]  [address] Phone: [phone]; Fax: [phone]

## 2018-06-08 ENCOUNTER — Other Ambulatory Visit: Payer: Self-pay | Admitting: Cardiology

## 2018-07-06 ENCOUNTER — Telehealth: Payer: Self-pay | Admitting: Cardiology

## 2018-07-06 NOTE — Telephone Encounter (Signed)
Pt calls in reporting that she hasn't felt good for about 3 days.  She got the flu shot on Thursday and hasn't felt good since. States that BP "usually stays pretty low, avg 120/70" States that "HR usually stays b/t 70-80"  This morning she felt really bad.  She took an extra Diltiazem 60 mg about an hr ago. Her BP was 145/110 & HR 142. While on the phone she took again per my request, reporting:  Left arm:   135/90, HR 90  Right arm: 143/92, HR 115    AFib clinic has no openings until Thursday/Friday. Will forward to Dr. Marlou Porch for advisement

## 2018-07-06 NOTE — Telephone Encounter (Signed)
New message  Pt c/o BP issue: STAT if pt c/o blurred vision, one-sided weakness or slurred speech  1. What are your last 5 BP readings? 145/110 bp 142 hr 07/06/2018   2. Are you having any other symptoms (ex. Dizziness, headache, blurred vision, passed out)? no  3. What is your BP issue?blood pressure and heart rate are elevated.

## 2018-07-08 NOTE — Telephone Encounter (Signed)
Agree with as needed diltiazem as administered.  Heart rate has improved. Please have her see atrial fibrillation clinic on Thursday. Candee Furbish, MD

## 2018-07-09 NOTE — Telephone Encounter (Signed)
Patient states she saw her PCP yesterday afternoon and was back in NSR. She is feeling much better today. Since shes in NSR she wanted to wait and be seen next week. Appt made.

## 2018-07-09 NOTE — Telephone Encounter (Signed)
Lm to call back

## 2018-07-09 NOTE — Telephone Encounter (Signed)
Will route to Afib clinic to see if they can schedule pt for Thursday?

## 2018-07-13 ENCOUNTER — Ambulatory Visit (HOSPITAL_COMMUNITY)
Admission: RE | Admit: 2018-07-13 | Discharge: 2018-07-13 | Disposition: A | Discharge status: Home / Self Care | Payer: Medicare Other | Source: Ambulatory Visit | Attending: Nurse Practitioner | Admitting: Nurse Practitioner

## 2018-07-13 ENCOUNTER — Encounter (HOSPITAL_COMMUNITY): Payer: Self-pay | Admitting: Nurse Practitioner

## 2018-07-13 VITALS — BP 128/84 | HR 81 | Ht 67.5 in | Wt 209.0 lb

## 2018-07-13 DIAGNOSIS — I10 Essential (primary) hypertension: Secondary | ICD-10-CM | POA: Diagnosis not present

## 2018-07-13 DIAGNOSIS — Z885 Allergy status to narcotic agent status: Secondary | ICD-10-CM | POA: Insufficient documentation

## 2018-07-13 DIAGNOSIS — Z8249 Family history of ischemic heart disease and other diseases of the circulatory system: Secondary | ICD-10-CM | POA: Insufficient documentation

## 2018-07-13 DIAGNOSIS — E785 Hyperlipidemia, unspecified: Secondary | ICD-10-CM | POA: Insufficient documentation

## 2018-07-13 DIAGNOSIS — Z880 Allergy status to penicillin: Secondary | ICD-10-CM | POA: Diagnosis not present

## 2018-07-13 DIAGNOSIS — I480 Paroxysmal atrial fibrillation: Secondary | ICD-10-CM | POA: Insufficient documentation

## 2018-07-13 DIAGNOSIS — M1990 Unspecified osteoarthritis, unspecified site: Secondary | ICD-10-CM | POA: Insufficient documentation

## 2018-07-13 DIAGNOSIS — Z7901 Long term (current) use of anticoagulants: Secondary | ICD-10-CM | POA: Insufficient documentation

## 2018-07-13 DIAGNOSIS — Z90710 Acquired absence of both cervix and uterus: Secondary | ICD-10-CM | POA: Diagnosis not present

## 2018-07-13 DIAGNOSIS — Z9889 Other specified postprocedural states: Secondary | ICD-10-CM | POA: Insufficient documentation

## 2018-07-13 DIAGNOSIS — Z79899 Other long term (current) drug therapy: Secondary | ICD-10-CM | POA: Diagnosis not present

## 2018-07-13 MED ORDER — DILTIAZEM HCL 30 MG PO TABS: ORAL_TABLET | ORAL | 1 refills | Status: DC

## 2018-07-13 NOTE — Progress Notes (Signed)
Primary Care Physician: Lavone Orn, MD Referring Physician: Dr. Juliet Rude is a 77 y.o. female with a h/o afib diagnosed 3 years ago and being seen for only second episode of afib since diagnosis last week. Pt states that it came on shortly after a flu shot. She is beng seen in the afib clinic for f/u. She took 60 mg carizem after it went on for several hours with a HR in the 140's. She converted shortly after that.   She is in SR today. Triggers discussed and may have been the flu shot. No tobacco or alcohol but moderate use of caffeine. She does exercise on a regular basis.   Today, she denies symptoms of palpitations, chest pain, shortness of breath, orthopnea, PND, lower extremity edema, dizziness, presyncope, syncope, or neurologic sequela. The patient is tolerating medications without difficulties and is otherwise without complaint today.   Past Medical History:  Diagnosis Date  . Arthritis   . Heart murmur   . HOH (hard of hearing)   . HTN (hypertension)    since pregnant at age 63  . Hyperlipemia   . PAF (paroxysmal atrial fibrillation) ([place])    a. dx on 06/2015 admission. spontaneously converted into NSR. placed on Eliquis   Past Surgical History:  Procedure Laterality Date  . ABDOMINAL HYSTERECTOMY    . BREAST CYST EXCISION Left   . BREAST SURGERY Left    lumpectomy 35 yrs ago  . NECK SURGERY     discectomy with cadaver bone    Current Outpatient Medications  Medication Sig Dispense Refill  . acetaminophen (TYLENOL) 500 MG tablet Take 500 mg by mouth every 6 (six) hours as needed for mild pain.    Marland Kitchen ALPRAZolam (XANAX) 0.25 MG tablet Take 0.25 mg by mouth 2 (two) times daily as needed for anxiety.    Marland Kitchen amLODipine (NORVASC) 5 MG tablet TAKE 1 TABLET BY MOUTH  DAILY 90 tablet 2  . atorvastatin (LIPITOR) 20 MG tablet Take 20 mg by mouth daily.    Marland Kitchen diltiazem (CARDIZEM CD) 120 MG 24 hr capsule Take 1 capsule (120 mg total) by mouth daily. 90 capsule 3  .  ELIQUIS 5 MG TABS tablet TAKE 1 TABLET BY MOUTH TWO  TIMES DAILY 180 tablet 1  . Multiple Vitamins-Minerals (MULTIVITAMIN WITH MINERALS) tablet Take 1 tablet by mouth daily.    Marland Kitchen Propylene Glycol (SYSTANE BALANCE) 0.6 % SOLN Place 1 drop into both eyes at bedtime.     . valsartan-hydrochlorothiazide (DIOVAN-HCT) 160-12.5 MG tablet TAKE 1 TABLET BY MOUTH  DAILY 90 tablet 2  . diltiazem (CARDIZEM) 30 MG tablet Take 1 tablet every 4 hours AS NEEDED for AFIB heart rate >100 45 tablet 1   No current facility-administered medications for this encounter.     Allergies  Allergen Reactions  . Morphine And Related Nausea And Vomiting  . Penicillins Nausea And Vomiting    Social History   Socioeconomic History  . Marital status: Widowed    Spouse name: Not on file  . Number of children: Not on file  . Years of education: Not on file  . Highest education level: Not on file  Occupational History  . Occupation: Retired  Scientific laboratory technician  . Financial resource strain: Not on file  . Food insecurity:    Worry: Not on file    Inability: Not on file  . Transportation needs:    Medical: Not on file    Non-medical: Not on file  Tobacco Use  . Smoking status: Never Smoker  . Smokeless tobacco: Never Used  Substance and Sexual Activity  . Alcohol use: No    Alcohol/week: 0.0 standard drinks  . Drug use: No  . Sexual activity: Not on file  Lifestyle  . Physical activity:    Days per week: Not on file    Minutes per session: Not on file  . Stress: Not on file  Relationships  . Social connections:    Talks on phone: Not on file    Gets together: Not on file    Attends religious service: Not on file    Active member of club or organization: Not on file    Attends meetings of clubs or organizations: Not on file    Relationship status: Not on file  . Intimate partner violence:    Fear of current or ex partner: Not on file    Emotionally abused: Not on file    Physically abused: Not on file     Forced sexual activity: Not on file  Other Topics Concern  . Not on file  [place], near [place]. Daughter currently lives with her.    Family History  Problem Relation Age of Onset  . Heart attack Mother 58  . CVA Mother 56  . Unexplained death Father 59  . Hypertension Sister   . Hypertension Brother   . Breast cancer Neg Hx     ROS- All systems are reviewed and negative except as per the HPI above  Physical Exam: Vitals:   07/13/18 1131  BP: 128/84  Pulse: 81  Weight: 94.8 kg  Height: 5' 7.5" (1.715 m)   Wt Readings from Last 3 Encounters:  07/13/18 94.8 kg  03/25/18 94.5 kg  04/21/17 94.7 kg    Labs: Lab Results  Component Value Date   NA 142 04/21/2017   K 4.2 04/21/2017   CL 104 04/21/2017   CO2 23 04/21/2017   GLUCOSE 122 (H) 04/21/2017   BUN 16 04/21/2017   CREATININE 0.74 04/21/2017   CALCIUM 9.8 04/21/2017   MG 2.0 06/23/2015   Lab Results  Component Value Date   INR 1.18 06/23/2015   Lab Results  Component Value Date   CHOL 142 06/24/2015   HDL 36 (L) 06/24/2015   LDLCALC 82 06/24/2015   TRIG 118 06/24/2015     GEN- The patient is well appearing, alert and oriented x 3 today.   Head- normocephalic, atraumatic Eyes-  Sclera clear, conjunctiva pink Ears- hearing intact Oropharynx- clear Neck- supple, no JVP Lymph- no cervical lymphadenopathy Lungs- Clear to ausculation bilaterally, normal work of breathing Heart- Regular rate and rhythm, no murmurs, rubs or gallops, PMI not laterally displaced GI- soft, NT, ND, + BS Extremities- no clubbing, cyanosis, or edema MS- no significant deformity or atrophy Skin- no rash or lesion Psych- euthymic mood, full affect Neuro- strength and sensation are intact  EKG- NSR at 81 bpm, pr int 162 ms, qrs int 66 ms, qtc 457 ms Echo- 2016-Study Conclusions  - Left ventricle: The cavity size was normal. Wall thickness was   increased in a pattern of  moderate LVH. Systolic function was   mildly reduced. The estimated ejection fraction was in the range   of 45% to 50%. Regional wall motion abnormalities cannot be   excluded. Doppler parameters are consistent with abnormal left   ventricular relaxation (grade 1 diastolic dysfunction). - Mitral valve: There was mild  regurgitation. - Atrial septum: No defect or patent foramen ovale was identified. - Pulmonary arteries: PA peak pressure: 31 mm Hg (S).   Assessment and Plan: 1. Paroxysmal afib  General education re afib Triggers discussed Pt plans to decrease some of her caffeine intake Encouraged regular exercise  Since pt has only had 2 episodes of afib in 3 years, her afib burden is low and I do not  believe the benefit of an antiarrythmic is worth the risk Dicussed  how to manage afib epiosdes I will prescribe 30 mg Cardizem for her to use as needed as she used her last 60 mg tablet the other day I also told her she is free to call here for help with breakthrough afib as needed Continue diltiazem 120 mg daily  2. CHA2DS2VASc score of at least  4 Continue eliquis 5 mg bid  F/u with Dr. Marlou Porch as scheduled, afib clinic as needed  Geroge Baseman. Mars Scheaffer, [facility] [address] [place], [place] [address] [phone]

## 2018-07-30 ENCOUNTER — Other Ambulatory Visit: Payer: Self-pay | Admitting: Internal Medicine

## 2018-07-30 DIAGNOSIS — Z1231 Encounter for screening mammogram for malignant neoplasm of breast: Secondary | ICD-10-CM

## 2018-08-02 ENCOUNTER — Other Ambulatory Visit: Payer: Self-pay | Admitting: Cardiology

## 2018-08-26 ENCOUNTER — Telehealth: Payer: Self-pay | Admitting: Cardiology

## 2018-08-26 MED ORDER — ATORVASTATIN CALCIUM 20 MG PO TABS: 20.0000 mg | ORAL_TABLET | Freq: Every day | ORAL | 3 refills | Status: DC

## 2018-08-26 MED ORDER — AMLODIPINE BESYLATE 5 MG PO TABS: 5.0000 mg | ORAL_TABLET | Freq: Every day | ORAL | 3 refills | Status: DC

## 2018-08-26 MED ORDER — DILTIAZEM HCL ER COATED BEADS 120 MG PO CP24: 120.0000 mg | ORAL_CAPSULE | Freq: Every day | ORAL | 2 refills | Status: DC

## 2018-08-26 MED ORDER — APIXABAN 5 MG PO TABS: 5.0000 mg | ORAL_TABLET | Freq: Two times a day (BID) | ORAL | 1 refills | Status: DC

## 2018-08-26 NOTE — Telephone Encounter (Signed)
Pt calling today to ask if she may have her diltiazem 120mg  sent in as DAW for Cardizem CD. Pt states her last shipment of dilt seems different as she has flipped into AF. Pt took a PRN 30mg  this morning and states she believes she is now back in NSR. I agreed to send in a refill for Cardizem CD DAW to her [facility] on file. Additional refills were also sent in per her request.

## 2018-08-26 NOTE — Telephone Encounter (Signed)
New message:       Pt c/o medication issue:  1. Name of Medication: diltiazem (CARDIZEM CD) 120 MG 24 hr capsule  2. How are you currently taking this medication (dosage and times per day)? TAKE 1 CAPSULE BY MOUTH DAILY  3. Are you having a reaction (difficulty breathing--STAT)? no  4. What is your medication issue? Pt states she has received a new order of this medication but the pill looks different and they told her to check with her Doctor. Pt states she is currently in afib with a pulse rate of 135 and has been going in and out of afib since Monday. She's not sure if the change of this pill is the reason or not.

## 2018-09-14 ENCOUNTER — Ambulatory Visit
Admission: RE | Admit: 2018-09-14 | Discharge: 2018-09-14 | Disposition: A | Discharge status: Home / Self Care | Payer: Medicare Other | Source: Ambulatory Visit | Attending: Internal Medicine | Admitting: Internal Medicine

## 2018-09-14 DIAGNOSIS — Z1231 Encounter for screening mammogram for malignant neoplasm of breast: Secondary | ICD-10-CM

## 2018-10-01 ENCOUNTER — Other Ambulatory Visit: Payer: Self-pay | Admitting: Cardiology

## 2018-10-01 MED ORDER — VALSARTAN-HYDROCHLOROTHIAZIDE 160-12.5 MG PO TABS: 1.0000 | ORAL_TABLET | Freq: Every day | ORAL | 1 refills | Status: DC

## 2018-12-17 ENCOUNTER — Telehealth: Payer: Self-pay | Admitting: *Deleted

## 2018-12-17 NOTE — Telephone Encounter (Signed)
   [facility] Pre-operative Risk Assessment    Request for surgical clearance:  1. What type of surgery is being performed? COLONOSCOPY   2. When is this surgery scheduled? 01/14/19   3. What type of clearance is required (medical clearance vs. Pharmacy clearance to hold med vs. Both)? BOTH  4. Are there any medications that need to be held prior to surgery and how long?ELIQUIS HOLD 24 HOURS PRIOR   5. Practice name and name of physician performing surgery? [facility]; DR. Therisa Doyne   6. What is your office phone number [phone]    7.   What is your office fax number [phone]  8.   Anesthesia type (None, local, MAC, general) ? PROPOFOL   Julaine Hua 12/17/2018, 10:36 AM  _________________________________________________________________   (provider comments below)

## 2018-12-17 NOTE — Telephone Encounter (Signed)
   Primary Cardiologist: No primary care provider on file.  Chart reviewed as part of pre-operative protocol coverage. Patient was contacted 12/17/2018 in reference to pre-operative risk assessment for pending surgery as outlined below.  Shannon Liu was last seen 03/2018  by Dr.Skains.  Since that day, Shannon Liu has done well w/o any significant recurrence of afib and no anginal symptoms.  Therefore, based on ACC/AHA guidelines, the patient would be at acceptable risk for the planned procedure without further cardiovascular testing.    Per pharmacy, pt can hold Eliquis 1 day prior to procedure.   I will route this recommendation to the requesting party via Epic fax function and remove from pre-op pool.  Please call with questions.  Lyda Jester, PA-C 12/17/2018, 4:30 PM

## 2018-12-17 NOTE — Telephone Encounter (Signed)
LVM for pt to call back. If no cardiac symptoms, she can be cleared.

## 2018-12-17 NOTE — Telephone Encounter (Signed)
Patient with diagnosis of afib on eliquis for anticoagulation.    Procedure: Colonoscopy Date of procedure: 01/14/2019  CHADS2-VASc score of  4 (CHF, HTN, AGE, DM2, stroke/tia x 2, CAD, AGE, female)  Per office protocol, patient can hold Eliquis for 1 days prior to procedure.

## 2019-02-22 ENCOUNTER — Telehealth (INDEPENDENT_AMBULATORY_CARE_PROVIDER_SITE_OTHER): Payer: Medicare Other | Admitting: Cardiology

## 2019-02-22 ENCOUNTER — Encounter: Payer: Self-pay | Admitting: Cardiology

## 2019-02-22 ENCOUNTER — Other Ambulatory Visit: Payer: Self-pay

## 2019-02-22 VITALS — BP 138/72 | HR 71 | Ht 67.5 in | Wt 206.0 lb

## 2019-02-22 DIAGNOSIS — I480 Paroxysmal atrial fibrillation: Secondary | ICD-10-CM | POA: Diagnosis not present

## 2019-02-22 DIAGNOSIS — E7800 Pure hypercholesterolemia, unspecified: Secondary | ICD-10-CM

## 2019-02-22 DIAGNOSIS — I10 Essential (primary) hypertension: Secondary | ICD-10-CM

## 2019-02-22 DIAGNOSIS — Z7901 Long term (current) use of anticoagulants: Secondary | ICD-10-CM

## 2019-02-22 NOTE — Progress Notes (Signed)
Virtual Visit via Video Note   This visit type was conducted due to national recommendations for restrictions regarding the COVID-19 Pandemic (e.g. social distancing) in an effort to limit this patient's exposure and mitigate transmission in our community.  Due to her co-morbid illnesses, this patient is at least at moderate risk for complications without adequate follow up.  This format is felt to be most appropriate for this patient at this time.  All issues noted in this document were discussed and addressed.  A limited physical exam was performed with this format.  Please refer to the patient's chart for her consent to telehealth for [facility].   Date:  02/22/2019   ID:  Shannon Liu, DOB [birth year], MRN [id number]  Patient Location: Home Provider Location: Home  PCP:  Lavone Orn, MD  Cardiologist:  Candee Furbish, MD  Electrophysiologist:  None   Evaluation Performed:  Follow-Up Visit  Chief Complaint: PAF follow-up  History of Present Illness:    Shannon Liu is a 78 y.o. female with paroxysmal atrial fibrillation on chronic anticoagulation with Eliquis, obesity, hypertension, hyperlipidemia here for follow-up.  Overall she has been doing quite well.  Has been under increased stress with COVID-19.  No bleeding no fevers chills nausea vomiting syncope.  She has been able to maintain her silver sneakers [employer] class online, 40 minutes, does stretches, banding exercise and some aerobics.  Silver sneaker 40 min. On line.   The patient does not have symptoms concerning for COVID-19 infection (fever, chills, cough, or new shortness of breath).    Past Medical History:  Diagnosis Date  . Arthritis   . Heart murmur   . HOH (hard of hearing)   . HTN (hypertension)    since pregnant at age 23  . Hyperlipemia   . PAF (paroxysmal atrial fibrillation) ([place])    a. dx on 06/2015 admission. spontaneously converted into NSR. placed on Eliquis   Past Surgical History:   Procedure Laterality Date  . ABDOMINAL HYSTERECTOMY    . BREAST CYST EXCISION Left   . BREAST SURGERY Left    lumpectomy 35 yrs ago  . NECK SURGERY     discectomy with cadaver bone     Current Meds  Medication Sig  . acetaminophen (TYLENOL) 500 MG tablet Take 500 mg by mouth every 6 (six) hours as needed for mild pain.  Marland Kitchen ALPRAZolam (XANAX) 0.25 MG tablet Take 0.25 mg by mouth 2 (two) times daily as needed for anxiety.  Marland Kitchen amLODipine (NORVASC) 5 MG tablet Take 1 tablet (5 mg total) by mouth daily.  Marland Kitchen apixaban (ELIQUIS) 5 MG TABS tablet Take 1 tablet (5 mg total) by mouth 2 (two) times daily.  Marland Kitchen atorvastatin (LIPITOR) 20 MG tablet Take 1 tablet (20 mg total) by mouth daily.  Marland Kitchen diltiazem (CARDIZEM CD) 120 MG 24 hr capsule Take 1 capsule (120 mg total) by mouth daily.  Marland Kitchen diltiazem (CARDIZEM) 30 MG tablet Take 1 tablet every 4 hours AS NEEDED for AFIB heart rate >100  . Multiple Vitamins-Minerals (MULTIVITAMIN WITH MINERALS) tablet Take 1 tablet by mouth daily.  . potassium chloride (K-DUR) 10 MEQ tablet Take 10 mEq by mouth daily. with food  . Propylene Glycol (SYSTANE BALANCE) 0.6 % SOLN Place 1 drop into both eyes at bedtime.   . valsartan-hydrochlorothiazide (DIOVAN-HCT) 160-12.5 MG tablet Take 1 tablet by mouth daily.     Allergies:   Morphine and related and Penicillins   Social History   Tobacco Use  .  Smoking status: Never Smoker  . Smokeless tobacco: Never Used  Substance Use Topics  . Alcohol use: No    Alcohol/week: 0.0 standard drinks  . Drug use: No     Family Hx: The patient's family history includes CVA (age of onset: 35) in her mother; Heart attack (age of onset: 81) in her mother; Hypertension in her brother and sister; Unexplained death (age of onset: 27) in her father. There is no history of Breast cancer.  ROS:   Please see the history of present illness.    Denies any fevers chills nausea vomiting syncope bleeding All other systems reviewed and are  negative.   Prior CV studies:   The following studies were reviewed today:  Echocardiogram 2016-EF slightly low 45 to 50% however nuclear stress test done at the same time showed EF of 70%.  No ischemia.  Labs/Other Tests and Data Reviewed:    EKG:  An ECG dated 07/13/2018 was personally reviewed today and demonstrated:  Sinus rhythm 81 bpm no other abnormalities  Recent Labs: No results found for requested labs within last 8760 hours.   Recent Lipid Panel Lab Results  Component Value Date/Time   CHOL 142 06/24/2015 03:29 AM   TRIG 118 06/24/2015 03:29 AM   HDL 36 (L) 06/24/2015 03:29 AM   CHOLHDL 3.9 06/24/2015 03:29 AM   LDLCALC 82 06/24/2015 03:29 AM    Wt Readings from Last 3 Encounters:  02/22/19 206 lb (93.4 kg)  07/13/18 209 lb (94.8 kg)  03/25/18 208 lb 6.4 oz (94.5 kg)     Objective:    Vital Signs:  BP 138/72   Pulse 71   Ht 5' 7.5" (1.715 m)   Wt 206 lb (93.4 kg)   BMI 31.79 kg/m    VITAL SIGNS:  reviewed GEN:  no acute distress EYES:  sclerae anicteric, EOMI - Extraocular Movements Intact RESPIRATORY:  normal respiratory effort, symmetric expansion SKIN:  no rash, lesions or ulcers. MUSCULOSKELETAL:  no obvious deformities. NEURO:  alert and oriented x 3, no obvious focal deficit PSYCH:  normal affect  ASSESSMENT & PLAN:    Paroxysmal atrial fibrillation - Continue with Eliquis, diltiazem.  Doing very well.  Very rare palpitations.  She knows to take an extra diltiazem if necessary.  Prior hemoglobin 14.4, creatinine 0.75.  Dr. Laurann Montana has been monitoring her labs.  This should be done again in November 2020.  Essential hypertension - Okay to continue with current regimen.  No changes.  Well-controlled.  Hyperlipidemia - Prior LDL 93 on atorvastatin 20 mg.  Excellent.  COVID-19 Education: The signs and symptoms of COVID-19 were discussed with the patient and how to seek care for testing (follow up with PCP or arrange E-visit).  The importance of  social distancing was discussed today.  Time:   Today, I have spent 15 minutes with the patient with telehealth technology discussing the above problems.     Medication Adjustments/Labs and Tests Ordered: Current medicines are reviewed at length with the patient today.  Concerns regarding medicines are outlined above.   Tests Ordered: No orders of the defined types were placed in this encounter.   Medication Changes: No orders of the defined types were placed in this encounter.   Disposition:  Follow up in 1 year(s)  Signed, Candee Furbish, MD  02/22/2019 12:22 PM    [facility]

## 2019-02-22 NOTE — Patient Instructions (Signed)
Medication Instructions:  Your physician recommends that you continue on your current medications as directed. Please refer to the Current Medication list given to you today.  If you need a refill on your cardiac medications before your next appointment, please call your pharmacy.   Lab work: None ordered If you have labs (blood work) drawn today and your tests are completely normal, you will receive your results only by: Marland Kitchen MyChart Message (if you have MyChart) OR . A paper copy in the mail If you have any lab test that is abnormal or we need to change your treatment, we will call you to review the results.  Testing/Procedures: None ordered  Follow-Up: At [facility], you and your health needs are our priority.  As part of our continuing mission to provide you with exceptional heart care, we have created designated Provider Care Teams.  These Care Teams include your primary Cardiologist (physician) and Advanced Practice Providers (APPs -  Physician Assistants and Nurse Practitioners) who all work together to provide you with the care you need, when you need it. You will need a follow up appointment in 12 months.  Please call our office 2 months in advance to schedule this appointment.  You may see Candee Furbish, MD or one of the following Advanced Practice Providers on your designated Care Team:   Truitt Merle, NP Cecilie Kicks, NP . Kathyrn Drown, NP  Any Other Special Instructions Will Be Listed Below (If Applicable).

## 2019-03-03 ENCOUNTER — Other Ambulatory Visit: Payer: Self-pay | Admitting: Cardiology

## 2019-03-03 NOTE — Telephone Encounter (Signed)
Age 78, weight 93kg, SCr 0.71 [facility] 09/2018.

## 2019-03-25 ENCOUNTER — Ambulatory Visit: Payer: Medicare Other | Admitting: Cardiology

## 2019-04-01 ENCOUNTER — Other Ambulatory Visit: Payer: Self-pay | Admitting: Cardiology

## 2019-04-13 LAB — HM COLONOSCOPY

## 2019-04-15 ENCOUNTER — Telehealth: Payer: Self-pay | Admitting: Cardiology

## 2019-04-15 NOTE — Telephone Encounter (Signed)
I spoke to the patient who is concerned because she was told on 7/1, while having colonoscopy with Shannon Therisa Doyne that she had a hx of stroke.  The patient was very concerned, because she never had a stroke.  She even reached out to Shannon Liu who apparently verified, "No stroke."    While researching through the records, it was noted by Shannon Liu that the patient had "stroke/tia x 2" in surgical clearance report from 12/17/18 @ 11:01 am.  The patient would like this clarified please.

## 2019-04-15 NOTE — Telephone Encounter (Signed)
New message:    Patient calling concerning a letter that is stating that she had a stroke and would like for some one to call her, because the information is wrong. Please call patient.

## 2019-04-15 NOTE — Telephone Encounter (Signed)
Pt has not had a stroke - the bolded risk factors listed in the clearance are the only risk factors that patient has.

## 2019-04-20 NOTE — Telephone Encounter (Signed)
Spoke with the patient, clarified the issue and sent the encounter explaining the situation to [facility] so they are updated.

## 2019-04-20 NOTE — Telephone Encounter (Signed)
Spoke with the patient and clarified the issue. Faxed [facility] to updated them.

## 2019-05-30 ENCOUNTER — Telehealth: Payer: Self-pay | Admitting: Cardiology

## 2019-05-30 NOTE — Telephone Encounter (Signed)
Pt called to report that she is having afib. She had been maintaining SR for about 4 years on diltiazem 120 mg per patient report.  This morning she awoke at about 6:30 AM with her heart racing and noted to be in A. fib according to her blood pressure monitor.  At about 8:00 it was continuing to be fast with blood pressure 140/105 and pulse 150.  She took an as needed Cardizem 30 mg with some improvement but heart rate continues to be fast with elevated blood pressure.  She denies any chest pain/pressure/shortness of breath.  She has a feeling of nervousness in her chest.  She is having occasional mild lightheadedness and mild nausea.  I advised patient to go ahead and take diltiazem 60 mg now which hopefully will help.  After 4 hours if heart rate continues to be over 100 and BP still strong she can take another 30 mg.  She may need her daily diltiazem dose increased, perhaps to 180 mg or 240 mg. I will route this message to Roderic Palau who sees the patient in the A. fib clinic and Dr. Marlou Porch for further recommendations.

## 2019-05-31 NOTE — Telephone Encounter (Signed)
Pt does not want to increase her dose of cardizem at this point. This was her first episode of afib in a long time. It did last until Midnight before finally breaking. She will call if frequency increases.

## 2019-06-04 ENCOUNTER — Other Ambulatory Visit: Payer: Self-pay

## 2019-06-04 ENCOUNTER — Ambulatory Visit (HOSPITAL_COMMUNITY)
Admission: RE | Admit: 2019-06-04 | Discharge: 2019-06-04 | Disposition: A | Discharge status: Home / Self Care | Payer: Medicare Other | Source: Ambulatory Visit | Attending: Nurse Practitioner | Admitting: Nurse Practitioner

## 2019-06-04 ENCOUNTER — Encounter (HOSPITAL_COMMUNITY): Payer: Self-pay | Admitting: Nurse Practitioner

## 2019-06-04 VITALS — BP 160/90 | HR 76 | Ht 67.5 in | Wt 208.2 lb

## 2019-06-04 DIAGNOSIS — Z880 Allergy status to penicillin: Secondary | ICD-10-CM | POA: Diagnosis not present

## 2019-06-04 DIAGNOSIS — I340 Nonrheumatic mitral (valve) insufficiency: Secondary | ICD-10-CM | POA: Insufficient documentation

## 2019-06-04 DIAGNOSIS — I480 Paroxysmal atrial fibrillation: Secondary | ICD-10-CM | POA: Diagnosis present

## 2019-06-04 DIAGNOSIS — Z886 Allergy status to analgesic agent status: Secondary | ICD-10-CM | POA: Diagnosis not present

## 2019-06-04 DIAGNOSIS — Z79899 Other long term (current) drug therapy: Secondary | ICD-10-CM | POA: Diagnosis not present

## 2019-06-04 DIAGNOSIS — I10 Essential (primary) hypertension: Secondary | ICD-10-CM | POA: Diagnosis not present

## 2019-06-04 DIAGNOSIS — E785 Hyperlipidemia, unspecified: Secondary | ICD-10-CM | POA: Insufficient documentation

## 2019-06-04 DIAGNOSIS — Z823 Family history of stroke: Secondary | ICD-10-CM | POA: Diagnosis not present

## 2019-06-04 DIAGNOSIS — Z8249 Family history of ischemic heart disease and other diseases of the circulatory system: Secondary | ICD-10-CM | POA: Insufficient documentation

## 2019-06-04 DIAGNOSIS — Z7901 Long term (current) use of anticoagulants: Secondary | ICD-10-CM | POA: Diagnosis not present

## 2019-06-04 DIAGNOSIS — M1990 Unspecified osteoarthritis, unspecified site: Secondary | ICD-10-CM | POA: Diagnosis not present

## 2019-06-04 MED ORDER — AMLODIPINE BESYLATE 5 MG PO TABS: 2.5000 mg | ORAL_TABLET | Freq: Every day | ORAL | 3 refills | Status: DC

## 2019-06-04 MED ORDER — DILTIAZEM HCL ER COATED BEADS 120 MG PO CP24: 120.0000 mg | ORAL_CAPSULE | Freq: Two times a day (BID) | ORAL | 2 refills | Status: DC

## 2019-06-04 NOTE — Patient Instructions (Signed)
Increase cardizem to 120mg  twice a day  Decrease amlodipine to 2.5mg  once a day  You will be scheduled for a sleep study

## 2019-06-04 NOTE — Progress Notes (Signed)
Primary Care Physician: Lavone Orn, MD Referring Physician: Dr. Janell Quiet Jonier Gatewood is a 78 y.o. female with h/o paroxysmal afib    diagnosed 4 years ago and has had very infrequent episodes.She is in the afib clinic for noting afib 2-3 times this week.  She would have resolution of symptoms several hours after taking prn diltiazem. She continues on daily Cardizem. Does have snoring/witnessed apnea episodes. She has also noted increase in BP over the last several weeks.   Today, she denies symptoms of palpitations, chest pain, shortness of breath, orthopnea, PND, lower extremity edema, dizziness, presyncope, syncope, or neurologic sequela. The patient is tolerating medications without difficulties and is otherwise without complaint today.   Past Medical History:  Diagnosis Date  . Arthritis   . Heart murmur   . HOH (hard of hearing)   . HTN (hypertension)    since pregnant at age 17  . Hyperlipemia   . PAF (paroxysmal atrial fibrillation) ([place])    a. dx on 06/2015 admission. spontaneously converted into NSR. placed on Eliquis   Past Surgical History:  Procedure Laterality Date  . ABDOMINAL HYSTERECTOMY    . BREAST CYST EXCISION Left   . BREAST SURGERY Left    lumpectomy 35 yrs ago  . NECK SURGERY     discectomy with cadaver bone    Current Outpatient Medications  Medication Sig Dispense Refill  . acetaminophen (TYLENOL) 500 MG tablet Take 500 mg by mouth every 6 (six) hours as needed for mild pain.    Marland Kitchen ALPRAZolam (XANAX) 0.25 MG tablet Take 0.25 mg by mouth 2 (two) times daily as needed for anxiety.    Marland Kitchen amLODipine (NORVASC) 5 MG tablet Take 0.5 tablets (2.5 mg total) by mouth daily. 90 tablet 3  . atorvastatin (LIPITOR) 20 MG tablet Take 1 tablet (20 mg total) by mouth daily. 90 tablet 3  . diltiazem (CARDIZEM CD) 120 MG 24 hr capsule Take 1 capsule (120 mg total) by mouth 2 (two) times daily. 180 capsule 2  . diltiazem (CARDIZEM) 30 MG tablet Take 1 tablet every  4 hours AS NEEDED for AFIB heart rate >100 45 tablet 1  . ELIQUIS 5 MG TABS tablet TAKE 1 TABLET BY MOUTH TWICE A DAY 180 tablet 1  . Multiple Vitamins-Minerals (MULTIVITAMIN WITH MINERALS) tablet Take 1 tablet by mouth daily.    . potassium chloride (K-DUR) 10 MEQ tablet Take 10 mEq by mouth daily. with food    . Propylene Glycol (SYSTANE BALANCE) 0.6 % SOLN Place 1 drop into both eyes at bedtime.     . psyllium (METAMUCIL SMOOTH TEXTURE) 28 % packet Take 1 packet by mouth 2 (two) times daily.    . valsartan-hydrochlorothiazide (DIOVAN-HCT) 160-12.5 MG tablet TAKE 1 TABLET BY MOUTH EVERY DAY 90 tablet 3   No current facility-administered medications for this encounter.     Allergies  Allergen Reactions  . Morphine And Related Nausea And Vomiting  . Penicillins Nausea And Vomiting    Social History   Socioeconomic History  . Marital status: Widowed    Spouse name: Not on file  . Number of children: Not on file  . Years of education: Not on file  . Highest education level: Not on file  Occupational History  . Occupation: Retired  Scientific laboratory technician  . Financial resource strain: Not on file  . Food insecurity    Worry: Not on file    Inability: Not on file  . Transportation needs  Medical: Not on file    Non-medical: Not on file  Tobacco Use  . Smoking status: Never Smoker  . Smokeless tobacco: Never Used  Substance and Sexual Activity  . Alcohol use: No    Alcohol/week: 0.0 standard drinks  . Drug use: No  . Sexual activity: Not on file  Lifestyle  . Physical activity    Days per week: Not on file    Minutes per session: Not on file  . Stress: Not on file  Relationships  . Social Herbalist on phone: Not on file    Gets together: Not on file    Attends religious service: Not on file    Active member of club or organization: Not on file    Attends meetings of clubs or organizations: Not on file    Relationship status: Not on file  . Intimate partner  violence    Fear of current or ex partner: Not on file    Emotionally abused: Not on file    Physically abused: Not on file    Forced sexual activity: Not on file  Other Topics Concern  . Not on file  [place], near [place]. Daughter currently lives with her.    Family History  Problem Relation Age of Onset  . Heart attack Mother 45  . CVA Mother 25  . Unexplained death Father 34  . Hypertension Sister   . Hypertension Brother   . Breast cancer Neg Hx     ROS- All systems are reviewed and negative except as per the HPI above  Physical Exam: Vitals:   06/04/19 1053  BP: (!) 160/90  Pulse: 76  Weight: 94.4 kg  Height: 5' 7.5" (1.715 m)   Wt Readings from Last 3 Encounters:  06/04/19 94.4 kg  02/22/19 93.4 kg  07/13/18 94.8 kg    Labs: Lab Results  Component Value Date   NA 142 04/21/2017   K 4.2 04/21/2017   CL 104 04/21/2017   CO2 23 04/21/2017   GLUCOSE 122 (H) 04/21/2017   BUN 16 04/21/2017   CREATININE 0.74 04/21/2017   CALCIUM 9.8 04/21/2017   MG 2.0 06/23/2015   Lab Results  Component Value Date   INR 1.18 06/23/2015   Lab Results  Component Value Date   CHOL 142 06/24/2015   HDL 36 (L) 06/24/2015   LDLCALC 82 06/24/2015   TRIG 118 06/24/2015     GEN- The patient is well appearing, alert and oriented x 3 today.   Head- normocephalic, atraumatic Eyes-  Sclera clear, conjunctiva pink Ears- hearing intact Oropharynx- clear Neck- supple, no JVP Lymph- no cervical lymphadenopathy Lungs- Clear to ausculation bilaterally, normal work of breathing Heart- Regular rate and rhythm, no murmurs, rubs or gallops, PMI not laterally displaced GI- soft, NT, ND, + BS Extremities- no clubbing, cyanosis, or edema MS- no significant deformity or atrophy Skin- no rash or lesion Psych- euthymic mood, full affect Neuro- strength and sensation are intact  EKG- NSR at 76 bpm, pr int 156 ms, qrs int 72 ms, qtc  443 ms Echo- 2016-Study Conclusions  - Left ventricle: The cavity size was normal. Wall thickness was   increased in a pattern of moderate LVH. Systolic function was   mildly reduced. The estimated ejection fraction was in the range   of 45% to 50%. Regional wall motion abnormalities cannot be   excluded. Doppler parameters are consistent with abnormal left  ventricular relaxation (grade 1 diastolic dysfunction). - Mitral valve: There was mild regurgitation. - Atrial septum: No defect or patent foramen ovale was identified. - Pulmonary arteries: PA peak pressure: 31 mm Hg (S).   Assessment and Plan: 1. Paroxysmal afib  Recent increase in afib burden  Review of afib and triggers  Since her BP is up( which may be triggering afib), as well as increase in afib burden, will increase cardizem to 120 mg bid She is also on amlodipine and will cut this in half and try to wean off as she is on 2 CCB's Will refer for sleep study( snoring, apnea spells) as sleep apnea may also be aggravating BP/afib  Can continue to use 30 mg Cardizem for her to use as needed   2. [id number] score of at least  4 Continue eliquis 5 mg bid  F/u in one week to see  response  of increase in CCB to afib burden and  BP   Dhruvi Crenshaw C. Kalany Diekmann, [facility] [address] [place], [place] [address] [phone]

## 2019-06-08 ENCOUNTER — Telehealth (HOSPITAL_COMMUNITY): Payer: Self-pay | Admitting: *Deleted

## 2019-06-08 MED ORDER — VALSARTAN-HYDROCHLOROTHIAZIDE 160-12.5 MG PO TABS: 2.0000 | ORAL_TABLET | Freq: Every day | ORAL | 3 refills | Status: DC

## 2019-06-08 NOTE — Telephone Encounter (Signed)
Patient called in stating she is having issues with her BP being elevated. Last night her BP was 187/98 HR 99. This morning it is 155/82 HR 88. Pt very anxious on the phone. Discussed with Roderic Palau NP will increase Diovan to 2 tablets in the AM and stop amlodipine. Pt has appt next week.

## 2019-06-11 ENCOUNTER — Telehealth: Payer: Self-pay | Admitting: *Deleted

## 2019-06-11 NOTE — Telephone Encounter (Signed)
Staff message sent to Gae Bon ok to schedule sleep study. Per [facility] web portal no PA is required. Decision [id number].

## 2019-06-14 ENCOUNTER — Ambulatory Visit (HOSPITAL_COMMUNITY)
Admission: RE | Admit: 2019-06-14 | Discharge: 2019-06-14 | Disposition: A | Discharge status: Home / Self Care | Payer: Medicare Other | Source: Ambulatory Visit | Attending: Nurse Practitioner | Admitting: Nurse Practitioner

## 2019-06-14 ENCOUNTER — Encounter (HOSPITAL_COMMUNITY): Payer: Self-pay | Admitting: Nurse Practitioner

## 2019-06-14 ENCOUNTER — Other Ambulatory Visit: Payer: Self-pay

## 2019-06-14 VITALS — BP 160/84 | HR 64 | Ht 67.5 in | Wt 208.6 lb

## 2019-06-14 DIAGNOSIS — Z79899 Other long term (current) drug therapy: Secondary | ICD-10-CM | POA: Insufficient documentation

## 2019-06-14 DIAGNOSIS — Z880 Allergy status to penicillin: Secondary | ICD-10-CM | POA: Diagnosis not present

## 2019-06-14 DIAGNOSIS — I480 Paroxysmal atrial fibrillation: Secondary | ICD-10-CM | POA: Diagnosis not present

## 2019-06-14 DIAGNOSIS — Z8249 Family history of ischemic heart disease and other diseases of the circulatory system: Secondary | ICD-10-CM | POA: Insufficient documentation

## 2019-06-14 DIAGNOSIS — I4891 Unspecified atrial fibrillation: Secondary | ICD-10-CM | POA: Diagnosis present

## 2019-06-14 DIAGNOSIS — Z885 Allergy status to narcotic agent status: Secondary | ICD-10-CM | POA: Diagnosis not present

## 2019-06-14 DIAGNOSIS — Z823 Family history of stroke: Secondary | ICD-10-CM | POA: Insufficient documentation

## 2019-06-14 DIAGNOSIS — E785 Hyperlipidemia, unspecified: Secondary | ICD-10-CM | POA: Diagnosis not present

## 2019-06-14 DIAGNOSIS — I10 Essential (primary) hypertension: Secondary | ICD-10-CM | POA: Diagnosis not present

## 2019-06-14 DIAGNOSIS — Z7901 Long term (current) use of anticoagulants: Secondary | ICD-10-CM | POA: Diagnosis not present

## 2019-06-14 DIAGNOSIS — M1990 Unspecified osteoarthritis, unspecified site: Secondary | ICD-10-CM | POA: Diagnosis not present

## 2019-06-14 DIAGNOSIS — H9190 Unspecified hearing loss, unspecified ear: Secondary | ICD-10-CM | POA: Diagnosis not present

## 2019-06-14 LAB — BASIC METABOLIC PANEL
Anion gap: 10 (ref 5–15)
BUN: 12 mg/dL (ref 8–23)
CO2: 27 mmol/L (ref 22–32)
Calcium: 9.7 mg/dL (ref 8.9–10.3)
Chloride: 103 mmol/L (ref 98–111)
Creatinine, Ser: 0.8 mg/dL (ref 0.44–1.00)
GFR calc Af Amer: >60 mL/min (ref >60)
GFR calc non Af Amer: >60 mL/min (ref >60)
Glucose, Bld: 109 mg/dL — ABNORMAL HIGH (ref 70–99)
Potassium: 4.2 mmol/L (ref 3.5–5.1)
Sodium: 140 mmol/L (ref 135–145)

## 2019-06-14 NOTE — Progress Notes (Signed)
Primary Care Physician: Lavone Orn, MD Referring Physician: Dr. Janell Quiet Noor Shannon Liu is a 78 y.o. female with h/o paroxysmal afib    diagnosed 4 years ago and has had very infrequent episodes.She is in the afib clinic for noting afib 2-3 times this week.  She would have resolution of symptoms several hours after taking prn diltiazem. She continues on daily Cardizem. Does have snoring/witnessed apnea episodes. She has also noted increase in BP over the last several weeks.   F/u in afib clinic, pt reports only one mild episode  of afib over the last 10 days, but her BP still is not optimal. She called the office last Tuesday with BP's not controlled and was told to increase to increase  valsartan/hctz from 160/12.5 to 320/25 mg qd. Her BP at home this am was [id number] systolic but in office presented at 160/80, when I rechecked 154/80. I had her to stop amlodipine as cardizem was increased.   Today, she denies symptoms of palpitations, chest pain, shortness of breath, orthopnea, PND, lower extremity edema, dizziness, presyncope, syncope, or neurologic sequela. The patient is tolerating medications without difficulties and is otherwise without complaint today.   Past Medical History:  Diagnosis Date  . Arthritis   . Heart murmur   . HOH (hard of hearing)   . HTN (hypertension)    since pregnant at age 76  . Hyperlipemia   . PAF (paroxysmal atrial fibrillation) ([place])    a. dx on 06/2015 admission. spontaneously converted into NSR. placed on Eliquis   Past Surgical History:  Procedure Laterality Date  . ABDOMINAL HYSTERECTOMY    . BREAST CYST EXCISION Left   . BREAST SURGERY Left    lumpectomy 35 yrs ago  . NECK SURGERY     discectomy with cadaver bone    Current Outpatient Medications  Medication Sig Dispense Refill  . acetaminophen (TYLENOL) 500 MG tablet Take 500 mg by mouth every 6 (six) hours as needed for mild pain.    Marland Kitchen ALPRAZolam (XANAX) 0.25 MG tablet Take 0.25 mg by  mouth 2 (two) times daily as needed for anxiety.    Marland Kitchen atorvastatin (LIPITOR) 20 MG tablet Take 1 tablet (20 mg total) by mouth daily. 90 tablet 3  . diltiazem (CARDIZEM CD) 120 MG 24 hr capsule Take 1 capsule (120 mg total) by mouth 2 (two) times daily. 180 capsule 2  . diltiazem (CARDIZEM) 30 MG tablet Take 1 tablet every 4 hours AS NEEDED for AFIB heart rate >100 45 tablet 1  . ELIQUIS 5 MG TABS tablet TAKE 1 TABLET BY MOUTH TWICE A DAY 180 tablet 1  . Multiple Vitamins-Minerals (MULTIVITAMIN WITH MINERALS) tablet Take 1 tablet by mouth daily.    . potassium chloride (K-DUR) 10 MEQ tablet Take 10 mEq by mouth daily. with food    . Propylene Glycol (SYSTANE BALANCE) 0.6 % SOLN Place 1 drop into both eyes at bedtime.     . psyllium (METAMUCIL SMOOTH TEXTURE) 28 % packet Take 1 packet by mouth 2 (two) times daily.    . valsartan-hydrochlorothiazide (DIOVAN-HCT) 160-12.5 MG tablet Take 2 tablets by mouth daily. 90 tablet 3   No current facility-administered medications for this encounter.     Allergies  Allergen Reactions  . Morphine And Related Nausea And Vomiting  . Penicillins Nausea And Vomiting    Social History   Socioeconomic History  . Marital status: Widowed    Spouse name: Not on file  . Number of  children: Not on file  . Years of education: Not on file  . Highest education level: Not on file  Occupational History  . Occupation: Retired  Scientific laboratory technician  . Financial resource strain: Not on file  . Food insecurity    Worry: Not on file    Inability: Not on file  . Transportation needs    Medical: Not on file    Non-medical: Not on file  Tobacco Use  . Smoking status: Never Smoker  . Smokeless tobacco: Never Used  Substance and Sexual Activity  . Alcohol use: No    Alcohol/week: 0.0 standard drinks  . Drug use: No  . Sexual activity: Not on file  Lifestyle  . Physical activity    Days per week: Not on file    Minutes per session: Not on file  . Stress: Not on file   Relationships  . Social Herbalist on phone: Not on file    Gets together: Not on file    Attends religious service: Not on file    Active member of club or organization: Not on file    Attends meetings of clubs or organizations: Not on file    Relationship status: Not on file  . Intimate partner violence    Fear of current or ex partner: Not on file    Emotionally abused: Not on file    Physically abused: Not on file    Forced sexual activity: Not on file  Other Topics Concern  . Not on file  [place], near [place]. Daughter currently lives with her.    Family History  Problem Relation Age of Onset  . Heart attack Mother 59  . CVA Mother 36  . Unexplained death Father 90  . Hypertension Sister   . Hypertension Brother   . Breast cancer Neg Hx     ROS- All systems are reviewed and negative except as per the HPI above  Physical Exam: Vitals:   06/14/19 1029  BP: (!) 160/84  Pulse: 64  Weight: 94.6 kg  Height: 5' 7.5" (1.715 m)   Wt Readings from Last 3 Encounters:  06/14/19 94.6 kg  06/04/19 94.4 kg  02/22/19 93.4 kg    Labs: Lab Results  Component Value Date   NA 142 04/21/2017   K 4.2 04/21/2017   CL 104 04/21/2017   CO2 23 04/21/2017   GLUCOSE 122 (H) 04/21/2017   BUN 16 04/21/2017   CREATININE 0.74 04/21/2017   CALCIUM 9.8 04/21/2017   MG 2.0 06/23/2015   Lab Results  Component Value Date   INR 1.18 06/23/2015   Lab Results  Component Value Date   CHOL 142 06/24/2015   HDL 36 (L) 06/24/2015   LDLCALC 82 06/24/2015   TRIG 118 06/24/2015     GEN- The patient is well appearing, alert and oriented x 3 today.   Head- normocephalic, atraumatic Eyes-  Sclera clear, conjunctiva pink Ears- hearing intact Oropharynx- clear Neck- supple, no JVP Lymph- no cervical lymphadenopathy Lungs- Clear to ausculation bilaterally, normal work of breathing Heart- Regular rate and rhythm, no  murmurs, rubs or gallops, PMI not laterally displaced GI- soft, NT, ND, + BS Extremities- no clubbing, cyanosis, or edema MS- no significant deformity or atrophy Skin- no rash or lesion Psych- euthymic mood, full affect Neuro- strength and sensation are intact  EKG- NSR at 64  bpm, pr int 160 ms, qrs int 70 ms, qtc  429 ms Echo- 2016-Study Conclusions  - Left ventricle: The cavity size was normal. Wall thickness was   increased in a pattern of moderate LVH. Systolic function was   mildly reduced. The estimated ejection fraction was in the range   of 45% to 50%. Regional wall motion abnormalities cannot be   excluded. Doppler parameters are consistent with abnormal left   ventricular relaxation (grade 1 diastolic dysfunction). - Mitral valve: There was mild regurgitation. - Atrial septum: No defect or patent foramen ovale was identified. - Pulmonary arteries: PA peak pressure: 31 mm Hg (S).   Assessment and Plan: 1. Paroxysmal afib  Recent increase in afib burden  Improved with increase of cardizem 120 mg bid  Since her BP is up( which may be triggering afib), stopped amlodipine and increased  valsartan/hctz to  320/25 qd Have referred  for sleep study( snoring, apnea spells) as sleep apnea may also be aggravating BP/afib  Can continue to use 30 mg Cardizem  as needed  bmet today for increase in valsartsn/hctz  2. CHA2DS2VASc score of at least  4 Continue eliquis 5 mg bid  F/u in one week for nurse BP check   Shannon Liu, [facility] [address] [place], [place] [address] [phone]

## 2019-06-17 ENCOUNTER — Telehealth: Payer: Self-pay | Admitting: *Deleted

## 2019-06-17 NOTE — Telephone Encounter (Signed)
-----   Message from Lauralee Evener, [place] sent at 06/11/2019  2:05 PM EDT ----- Regarding: RE: sleep study Ok to schedule sleep study. Per [facility] web portal no PA is required. Decision [id number]. ----- Message ----- From: Juluis Mire, RN Sent: 06/04/2019  11:34 AM EDT To: [facility] Div Sleep Studies Subject: sleep study                                    Pt needs sleep study for Witnessed apnea, snoring, afib per donna carroll  Thanks stacy

## 2019-06-17 NOTE — Telephone Encounter (Signed)
Patient is scheduled for lab study on 07/01/19. Patient is scheduled for COVID screening on 06/28/19 prior to study. Patient understands her sleep study will be done at [facility] sleep lab. Patient understands she will receive a sleep packet in a week or so. Patient understands to call if she does not receive the sleep packet in a timely manner. Patient agrees with treatment and thanked me for call.

## 2019-06-22 ENCOUNTER — Ambulatory Visit (HOSPITAL_COMMUNITY)
Admission: RE | Admit: 2019-06-22 | Discharge: 2019-06-22 | Disposition: A | Discharge status: Home / Self Care | Payer: Medicare Other | Source: Ambulatory Visit | Attending: Nurse Practitioner | Admitting: Nurse Practitioner

## 2019-06-22 ENCOUNTER — Other Ambulatory Visit: Payer: Self-pay

## 2019-06-22 ENCOUNTER — Encounter (HOSPITAL_COMMUNITY): Payer: Self-pay | Admitting: Physician Assistant

## 2019-06-22 VITALS — BP 160/96

## 2019-06-22 DIAGNOSIS — Z79899 Other long term (current) drug therapy: Secondary | ICD-10-CM | POA: Insufficient documentation

## 2019-06-22 DIAGNOSIS — R030 Elevated blood-pressure reading, without diagnosis of hypertension: Secondary | ICD-10-CM | POA: Insufficient documentation

## 2019-06-22 DIAGNOSIS — I10 Essential (primary) hypertension: Secondary | ICD-10-CM

## 2019-06-22 MED ORDER — METOPROLOL SUCCINATE ER 25 MG PO TB24: 25.0000 mg | ORAL_TABLET | Freq: Every day | ORAL | 3 refills | Status: DC

## 2019-06-22 MED ORDER — VALSARTAN-HYDROCHLOROTHIAZIDE 320-25 MG PO TABS: 1.0000 | ORAL_TABLET | Freq: Every day | ORAL | 3 refills | Status: DC

## 2019-06-22 NOTE — Patient Instructions (Signed)
Start metoprolol 25mg once a day at bedtime 

## 2019-06-22 NOTE — Progress Notes (Signed)
Pt returns for BP check. Her BP today remains elevated at 160/96 despite increasing Diovan HCT. Patient brings in a home BP log which shows elevated readings, particularly at night. Will start Toprol 25 mg daily to take in the evening. Will plan to bring her back in 2 weeks for recheck. Of note, sleep study is pending.

## 2019-06-24 ENCOUNTER — Encounter (HOSPITAL_COMMUNITY): Payer: Self-pay

## 2019-06-28 ENCOUNTER — Other Ambulatory Visit (HOSPITAL_COMMUNITY)
Admission: RE | Admit: 2019-06-28 | Discharge: 2019-06-28 | Disposition: A | Discharge status: Home / Self Care | Payer: Medicare Other | Source: Ambulatory Visit | Attending: Cardiology | Admitting: Cardiology

## 2019-06-28 DIAGNOSIS — Z01812 Encounter for preprocedural laboratory examination: Secondary | ICD-10-CM | POA: Diagnosis present

## 2019-06-28 DIAGNOSIS — Z20828 Contact with and (suspected) exposure to other viral communicable diseases: Secondary | ICD-10-CM | POA: Diagnosis not present

## 2019-06-29 ENCOUNTER — Telehealth: Payer: Self-pay

## 2019-06-29 LAB — NOVEL CORONAVIRUS, NAA (HOSP ORDER, SEND-OUT TO REF LAB; TAT 18-24 HRS): SARS-CoV-2, NAA: NOT DETECTED

## 2019-06-29 NOTE — Telephone Encounter (Signed)
Notes recorded by Frederik Schmidt, RN on 06/29/2019 at 12:25 PM EDT  Lpm with results 9/15  ------

## 2019-06-29 NOTE — Telephone Encounter (Signed)
-----   Message from Sueanne Margarita, MD sent at 06/29/2019 12:16 PM EDT ----- Please let patient know that labs were normal.  Continue current medical therapy.

## 2019-07-01 ENCOUNTER — Other Ambulatory Visit: Payer: Self-pay

## 2019-07-01 ENCOUNTER — Ambulatory Visit (HOSPITAL_BASED_OUTPATIENT_CLINIC_OR_DEPARTMENT_OTHER): Payer: Medicare Other | Attending: Nurse Practitioner | Admitting: Cardiology

## 2019-07-01 DIAGNOSIS — G4733 Obstructive sleep apnea (adult) (pediatric): Secondary | ICD-10-CM

## 2019-07-01 DIAGNOSIS — G4736 Sleep related hypoventilation in conditions classified elsewhere: Secondary | ICD-10-CM | POA: Diagnosis not present

## 2019-07-01 DIAGNOSIS — I480 Paroxysmal atrial fibrillation: Secondary | ICD-10-CM

## 2019-07-08 ENCOUNTER — Encounter (HOSPITAL_COMMUNITY): Payer: Self-pay | Admitting: Nurse Practitioner

## 2019-07-08 ENCOUNTER — Other Ambulatory Visit: Payer: Self-pay

## 2019-07-08 ENCOUNTER — Ambulatory Visit (HOSPITAL_COMMUNITY)
Admission: RE | Admit: 2019-07-08 | Discharge: 2019-07-08 | Disposition: A | Discharge status: Home / Self Care | Payer: Medicare Other | Source: Ambulatory Visit | Attending: Nurse Practitioner | Admitting: Nurse Practitioner

## 2019-07-08 VITALS — BP 142/78 | HR 65 | Ht 67.0 in | Wt 205.6 lb

## 2019-07-08 DIAGNOSIS — Z880 Allergy status to penicillin: Secondary | ICD-10-CM | POA: Diagnosis not present

## 2019-07-08 DIAGNOSIS — Z7901 Long term (current) use of anticoagulants: Secondary | ICD-10-CM | POA: Insufficient documentation

## 2019-07-08 DIAGNOSIS — Z8249 Family history of ischemic heart disease and other diseases of the circulatory system: Secondary | ICD-10-CM | POA: Insufficient documentation

## 2019-07-08 DIAGNOSIS — E785 Hyperlipidemia, unspecified: Secondary | ICD-10-CM | POA: Diagnosis not present

## 2019-07-08 DIAGNOSIS — I480 Paroxysmal atrial fibrillation: Secondary | ICD-10-CM | POA: Insufficient documentation

## 2019-07-08 DIAGNOSIS — I10 Essential (primary) hypertension: Secondary | ICD-10-CM | POA: Insufficient documentation

## 2019-07-08 DIAGNOSIS — Z79899 Other long term (current) drug therapy: Secondary | ICD-10-CM | POA: Insufficient documentation

## 2019-07-08 DIAGNOSIS — Z885 Allergy status to narcotic agent status: Secondary | ICD-10-CM | POA: Diagnosis not present

## 2019-07-08 NOTE — Progress Notes (Signed)
Primary Care Physician: Lavone Orn, MD Referring Physician: Dr. Janell Quiet Mirayah Shannon Liu is a 78 y.o. female with h/o paroxysmal afib    diagnosed 4 years ago and has had very infrequent episodes.She is in the afib clinic for noting afib 2-3 times this week.  She would have resolution of symptoms several hours after taking prn diltiazem. She continues on daily Cardizem. Does have snoring/witnessed apnea episodes. She has also noted increase in BP over the last several weeks.   F/u in afib clinic, pt reports only one mild episode  of afib over the last 10 days, but her BP still is not optimal. She called the office last Tuesday with BP's not controlled and was told to increase to increase  valsartan/hctz from 160/12.5 to 320/25 mg qd. Her BP at home this am was [id number] systolic but in office presented at 160/80, when I rechecked 154/80. I had her to stop amlodipine as cardizem was increased.   She was then see f/u for BP check and BP still elevated and was placed on metoprolol 25 mg at hs. This  has helped her BP. Had 2 hours of afib this am, first episode afib in about one month. Came on at 4:30 this am. She is pending results of her sleep study done 9/22.    Today, she denies symptoms of palpitations, chest pain, shortness of breath, orthopnea, PND, lower extremity edema, dizziness, presyncope, syncope, or neurologic sequela. The patient is tolerating medications without difficulties and is otherwise without complaint today.   Past Medical History:  Diagnosis Date  . Arthritis   . Heart murmur   . HOH (hard of hearing)   . HTN (hypertension)    since pregnant at age 33  . Hyperlipemia   . PAF (paroxysmal atrial fibrillation) ([place])    a. dx on 06/2015 admission. spontaneously converted into NSR. placed on Eliquis   Past Surgical History:  Procedure Laterality Date  . ABDOMINAL HYSTERECTOMY    . BREAST CYST EXCISION Left   . BREAST SURGERY Left    lumpectomy 35 yrs ago  . NECK  SURGERY     discectomy with cadaver bone    Current Outpatient Medications  Medication Sig Dispense Refill  . acetaminophen (TYLENOL) 500 MG tablet Take 500 mg by mouth every 6 (six) hours as needed for mild pain.    Marland Kitchen ALPRAZolam (XANAX) 0.25 MG tablet Take 0.25 mg by mouth 2 (two) times daily as needed for anxiety.    Marland Kitchen atorvastatin (LIPITOR) 20 MG tablet Take 1 tablet (20 mg total) by mouth daily. 90 tablet 3  . diltiazem (CARDIZEM CD) 120 MG 24 hr capsule Take 1 capsule (120 mg total) by mouth 2 (two) times daily. 180 capsule 2  . diltiazem (CARDIZEM) 30 MG tablet Take 1 tablet every 4 hours AS NEEDED for AFIB heart rate >100 45 tablet 1  . ELIQUIS 5 MG TABS tablet TAKE 1 TABLET BY MOUTH TWICE A DAY 180 tablet 1  . metoprolol succinate (TOPROL XL) 25 MG 24 hr tablet Take 1 tablet (25 mg total) by mouth at bedtime. 30 tablet 3  . Multiple Vitamins-Minerals (MULTIVITAMIN WITH MINERALS) tablet Take 1 tablet by mouth daily.    . potassium chloride (K-DUR) 10 MEQ tablet Take 10 mEq by mouth daily. with food    . Propylene Glycol (SYSTANE BALANCE) 0.6 % SOLN Place 1 drop into both eyes at bedtime.     . psyllium (METAMUCIL SMOOTH TEXTURE) 28 % packet  Take 1 packet by mouth 2 (two) times daily.    . valsartan-hydrochlorothiazide (DIOVAN HCT) 320-25 MG tablet Take 1 tablet by mouth daily. 30 tablet 3   No current facility-administered medications for this encounter.     Allergies  Allergen Reactions  . Morphine And Related Nausea And Vomiting  . Penicillins Nausea And Vomiting    Social History   Socioeconomic History  . Marital status: Widowed    Spouse name: Not on file  . Number of children: Not on file  . Years of education: Not on file  . Highest education level: Not on file  Occupational History  . Occupation: Retired  Scientific laboratory technician  . Financial resource strain: Not on file  . Food insecurity    Worry: Not on file    Inability: Not on file  . Transportation needs     Medical: Not on file    Non-medical: Not on file  Tobacco Use  . Smoking status: Never Smoker  . Smokeless tobacco: Never Used  Substance and Sexual Activity  . Alcohol use: No    Alcohol/week: 0.0 standard drinks  . Drug use: No  . Sexual activity: Not on file  Lifestyle  . Physical activity    Days per week: Not on file    Minutes per session: Not on file  . Stress: Not on file  Relationships  . Social Herbalist on phone: Not on file    Gets together: Not on file    Attends religious service: Not on file    Active member of club or organization: Not on file    Attends meetings of clubs or organizations: Not on file    Relationship status: Not on file  . Intimate partner violence    Fear of current or ex partner: Not on file    Emotionally abused: Not on file    Physically abused: Not on file    Forced sexual activity: Not on file  Other Topics Concern  . Not on file  [place], near [place]. Daughter currently lives with her.    Family History  Problem Relation Age of Onset  . Heart attack Mother 82  . CVA Mother 85  . Unexplained death Father 72  . Hypertension Sister   . Hypertension Brother   . Breast cancer Neg Hx     ROS- All systems are reviewed and negative except as per the HPI above  Physical Exam: Vitals:   07/08/19 1054  BP: (!) 142/78  Pulse: 65  Weight: 93.3 kg  Height: 5\' 7"  (1.702 m)   Wt Readings from Last 3 Encounters:  07/08/19 93.3 kg  07/01/19 93.4 kg  06/14/19 94.6 kg    Labs: Lab Results  Component Value Date   NA 140 06/14/2019   K 4.2 06/14/2019   CL 103 06/14/2019   CO2 27 06/14/2019   GLUCOSE 109 (H) 06/14/2019   BUN 12 06/14/2019   CREATININE 0.80 06/14/2019   CALCIUM 9.7 06/14/2019   MG 2.0 06/23/2015   Lab Results  Component Value Date   INR 1.18 06/23/2015   Lab Results  Component Value Date   CHOL 142 06/24/2015   HDL 36 (L) 06/24/2015    LDLCALC 82 06/24/2015   TRIG 118 06/24/2015     GEN- The patient is well appearing, alert and oriented x 3 today.   Head- normocephalic, atraumatic Eyes-  Sclera clear, conjunctiva pink Ears-  hearing intact Oropharynx- clear Neck- supple, no JVP Lymph- no cervical lymphadenopathy Lungs- Clear to ausculation bilaterally, normal work of breathing Heart- Regular rate and rhythm, no murmurs, rubs or gallops, PMI not laterally displaced GI- soft, NT, ND, + BS Extremities- no clubbing, cyanosis, or edema MS- no significant deformity or atrophy Skin- no rash or lesion Psych- euthymic mood, full affect Neuro- strength and sensation are intact  EKG- NSR at 64  bpm, pr int 160 ms, qrs int 70 ms, qtc 429 ms Echo- 2016-Study Conclusions  - Left ventricle: The cavity size was normal. Wall thickness was   increased in a pattern of moderate LVH. Systolic function was   mildly reduced. The estimated ejection fraction was in the range   of 45% to 50%. Regional wall motion abnormalities cannot be   excluded. Doppler parameters are consistent with abnormal left   ventricular relaxation (grade 1 diastolic dysfunction). - Mitral valve: There was mild regurgitation. - Atrial septum: No defect or patent foramen ovale was identified. - Pulmonary arteries: PA peak pressure: 31 mm Hg (S).   Assessment and Plan: 1. Paroxysmal afib  Recent increase in afib burden  Improved with increase of cardizem 120 mg bid  Since her BP was elevated, earlier visit, stopped amlodipine and increased  valsartan/hctz to  320/25 qd Waiting for results for sleep study  Can continue to use 30 mg Cardizem  as needed    2. CHA2DS2VASc score of at least  4 Continue eliquis 5 mg bid  F/u in one month  Butch Penny C. Chanie Soucek, [facility] [address] [place], [place] [address] [phone]

## 2019-07-16 ENCOUNTER — Telehealth: Payer: Self-pay | Admitting: Cardiology

## 2019-07-16 NOTE — Telephone Encounter (Signed)
Let pt know that results are not available yet but we will call once they are

## 2019-07-16 NOTE — Telephone Encounter (Signed)
New Message   Patient is calling in to see if the results are back for the sleep study. Please give patient a call back.

## 2019-07-20 NOTE — Procedures (Signed)
   Patient Name: Shannon Liu, Shannon Liu Date: 07/01/2019 Gender: Female D.O.B: Feb 13, 1941 Age (years): 77 Referring Provider: Sherran Needs Height (inches): 60 Interpreting Physician: Fransico Him MD, ABSM Weight (lbs): 206 RPSGT: Baxter Flattery BMI: 32 MRN: [id number] Neck Size: 16.00  CLINICAL INFORMATION Sleep Study Type: NPSG  Indication for sleep study: Snoring, Witnesses Apnea / Gasping During Sleep  Epworth Sleepiness Score: 4  SLEEP STUDY TECHNIQUE As per the AASM Manual for the Scoring of Sleep and Associated Events v2.3 (April 2016) with a hypopnea requiring 4% desaturations.  The channels recorded and monitored were frontal, central and occipital EEG, electrooculogram (EOG), submentalis EMG (chin), nasal and oral airflow, thoracic and abdominal wall motion, anterior tibialis EMG, snore microphone, electrocardiogram, and pulse oximetry.  MEDICATIONS Medications self-administered by patient taken the night of the study : N/A  SLEEP ARCHITECTURE The study was initiated at 11:09:20 PM and ended at 5:34:45 AM.  Sleep onset time was 7.1 minutes and the sleep efficiency was 87.7%. The total sleep time was 337.8 minutes.  Stage REM latency was 177.0 minutes.  The patient spent 6.2% of the night in stage N1 sleep, 76.5% in stage N2 sleep, 0.1% in stage N3 and 17.2% in REM.  Alpha intrusion was absent.  Supine sleep was 4.44%.  RESPIRATORY PARAMETERS The overall apnea/hypopnea index (AHI) was 10.5 per hour. There were 25 total apneas, including 23 obstructive, 2 central and 0 mixed apneas. There were 34 hypopneas and 0 RERAs.  The AHI during Stage REM sleep was 18.6 per hour.  AHI while supine was 0.0 per hour.  The mean oxygen saturation was 91.5%. The minimum SpO2 during sleep was 78.0%.  loud snoring was noted during this study.  CARDIAC DATA The 2 lead EKG demonstrated sinus rhythm. The mean heart rate was 51.3 beats per minute. Other EKG findings include:  Possible paroxysmal atrial Fibrillation (poor EKG tracings).  LEG MOVEMENT DATA The total PLMS were 0 with a resulting PLMS index of 0.0. Associated arousal with leg movement index was 0.4 .  IMPRESSIONS - Mild obstructive sleep apnea occurred during this study (AHI = 10.5/h). - No significant central sleep apnea occurred during this study (CAI = 0.4/h). - Moderate oxygen desaturation was noted during this study (Min O2 = 78.0%). - The patient snored with loud snoring volume. - EKG findings include possible paroxysmal Atrial Fibrillation (EKG tracings poor in beginning of study). - Clinically significant periodic limb movements did not occur during sleep. No significant associated arousals.  DIAGNOSIS - Obstructive Sleep Apnea (327.23 [G47.33 ICD-10]) - Nocturnal Hypoxemia (327.26 [G47.36 ICD-10])  RECOMMENDATIONS - Therapeutic CPAP titration to determine optimal pressure required to alleviate sleep disordered breathing. - Avoid alcohol, sedatives and other CNS depressants that may worsen sleep apnea and disrupt normal sleep architecture. - Sleep hygiene should be reviewed to assess factors that may improve sleep quality. - Weight management and regular exercise should be initiated or continued if appropriate.  [Electronically signed] 07/20/2019 09:36 PM  Fransico Him MD, ABSM Diplomate, American Board of Sleep Medicine

## 2019-07-21 ENCOUNTER — Telehealth: Payer: Self-pay | Admitting: *Deleted

## 2019-07-21 DIAGNOSIS — I10 Essential (primary) hypertension: Secondary | ICD-10-CM

## 2019-07-21 NOTE — Telephone Encounter (Signed)
Informed patient of sleep study results and patient understanding was verbalized. Patient understands her sleep study showed they have sleep apnea and recommend CPAP titration. Please set up titration in the sleep lab.  Pt is aware and agreeable to her results.  cpap tiration sent to sleep lab

## 2019-07-21 NOTE — Telephone Encounter (Signed)
Staff message sent to [facility] per [facility] no PA required for sleep study. Ok to schedule. Decision [id number].

## 2019-07-21 NOTE — Telephone Encounter (Signed)
-----   Message from Sueanne Margarita, MD sent at 07/20/2019  9:38 PM EDT ----- Please let patient know that they have sleep apnea and recommend CPAP titration. Please set up titration in the sleep lab.

## 2019-07-24 ENCOUNTER — Encounter (HOSPITAL_COMMUNITY): Payer: Self-pay

## 2019-08-02 NOTE — Telephone Encounter (Signed)
RE: Cranston Neighbor, CMA  Freada Bergeron, CMA        Per [facility] web portal no PA is required. Ok to schedule. Decision [id number].     ----- Message -----  From: Freada Bergeron, CMA  Sent: 07/21/2019 12:22 PM EDT  To: [facility] Sleep Studies  Subject: PRECERT                      recommend CPAP titration

## 2019-08-02 NOTE — Telephone Encounter (Signed)
Patient is scheduled for CPAP Titration on 08/08/19. Patient is scheduled for COVID screening on 08/05/19 12:15 prior to titration.   Patient understands his titration study will be done at [facility] sleep lab. Patient agrees with treatment and thanked me for call.

## 2019-08-05 ENCOUNTER — Other Ambulatory Visit (HOSPITAL_COMMUNITY)
Admission: RE | Admit: 2019-08-05 | Discharge: 2019-08-05 | Disposition: A | Discharge status: Home / Self Care | Payer: Medicare Other | Source: Ambulatory Visit | Attending: Cardiology | Admitting: Cardiology

## 2019-08-05 DIAGNOSIS — Z20828 Contact with and (suspected) exposure to other viral communicable diseases: Secondary | ICD-10-CM | POA: Insufficient documentation

## 2019-08-05 DIAGNOSIS — Z01812 Encounter for preprocedural laboratory examination: Secondary | ICD-10-CM | POA: Insufficient documentation

## 2019-08-06 ENCOUNTER — Other Ambulatory Visit: Payer: Self-pay | Admitting: Internal Medicine

## 2019-08-06 DIAGNOSIS — Z1231 Encounter for screening mammogram for malignant neoplasm of breast: Secondary | ICD-10-CM

## 2019-08-06 LAB — NOVEL CORONAVIRUS, NAA (HOSP ORDER, SEND-OUT TO REF LAB; TAT 18-24 HRS): SARS-CoV-2, NAA: NOT DETECTED

## 2019-08-08 ENCOUNTER — Ambulatory Visit (HOSPITAL_BASED_OUTPATIENT_CLINIC_OR_DEPARTMENT_OTHER): Payer: Medicare Other | Attending: Cardiology | Admitting: Cardiology

## 2019-08-08 ENCOUNTER — Other Ambulatory Visit: Payer: Self-pay

## 2019-08-08 VITALS — Ht 67.0 in | Wt 206.0 lb

## 2019-08-08 DIAGNOSIS — G4733 Obstructive sleep apnea (adult) (pediatric): Secondary | ICD-10-CM | POA: Diagnosis not present

## 2019-08-08 DIAGNOSIS — I10 Essential (primary) hypertension: Secondary | ICD-10-CM | POA: Diagnosis present

## 2019-08-09 ENCOUNTER — Ambulatory Visit (HOSPITAL_COMMUNITY): Payer: Medicare Other | Admitting: Nurse Practitioner

## 2019-08-12 ENCOUNTER — Ambulatory Visit (HOSPITAL_COMMUNITY)
Admission: RE | Admit: 2019-08-12 | Discharge: 2019-08-12 | Disposition: A | Discharge status: Home / Self Care | Payer: Medicare Other | Source: Ambulatory Visit | Attending: Nurse Practitioner | Admitting: Nurse Practitioner

## 2019-08-12 ENCOUNTER — Other Ambulatory Visit: Payer: Self-pay

## 2019-08-12 ENCOUNTER — Encounter (HOSPITAL_COMMUNITY): Payer: Self-pay | Admitting: Nurse Practitioner

## 2019-08-12 VITALS — BP 176/76 | HR 55 | Ht 67.0 in | Wt 208.2 lb

## 2019-08-12 DIAGNOSIS — R011 Cardiac murmur, unspecified: Secondary | ICD-10-CM | POA: Diagnosis not present

## 2019-08-12 DIAGNOSIS — H9190 Unspecified hearing loss, unspecified ear: Secondary | ICD-10-CM | POA: Diagnosis not present

## 2019-08-12 DIAGNOSIS — E785 Hyperlipidemia, unspecified: Secondary | ICD-10-CM | POA: Insufficient documentation

## 2019-08-12 DIAGNOSIS — Z7901 Long term (current) use of anticoagulants: Secondary | ICD-10-CM | POA: Diagnosis not present

## 2019-08-12 DIAGNOSIS — Z79899 Other long term (current) drug therapy: Secondary | ICD-10-CM | POA: Diagnosis not present

## 2019-08-12 DIAGNOSIS — I480 Paroxysmal atrial fibrillation: Secondary | ICD-10-CM | POA: Diagnosis present

## 2019-08-12 DIAGNOSIS — G4733 Obstructive sleep apnea (adult) (pediatric): Secondary | ICD-10-CM | POA: Diagnosis not present

## 2019-08-12 DIAGNOSIS — I10 Essential (primary) hypertension: Secondary | ICD-10-CM | POA: Diagnosis not present

## 2019-08-12 DIAGNOSIS — R0683 Snoring: Secondary | ICD-10-CM | POA: Insufficient documentation

## 2019-08-12 MED ORDER — METOPROLOL SUCCINATE ER 25 MG PO TB24: 12.5000 mg | ORAL_TABLET | Freq: Every day | ORAL | 1 refills | Status: DC

## 2019-08-12 NOTE — Procedures (Signed)
    Patient Name: Shannon Liu, Shannon Liu Date: 08/08/2019 Gender: Female D.O.B: 1940-12-18 Age (years): 65 Referring Provider: Fransico Him MD, ABSM Height (inches): 67 Interpreting Physician: Fransico Him MD, ABSM Weight (lbs): 206 RPSGT: Gwenyth Allegra BMI: 32 MRN: [id number] Neck Size: 16.00  CLINICAL INFORMATION The patient is referred for a CPAP titration to treat sleep apnea.  SLEEP STUDY TECHNIQUE As per the AASM Manual for the Scoring of Sleep and Associated Events v2.3 (April 2016) with a hypopnea requiring 4% desaturations.  The channels recorded and monitored were frontal, central and occipital EEG, electrooculogram (EOG), submentalis EMG (chin), nasal and oral airflow, thoracic and abdominal wall motion, anterior tibialis EMG, snore microphone, electrocardiogram, and pulse oximetry. Continuous positive airway pressure (CPAP) was initiated at the beginning of the study and titrated to treat sleep-disordered breathing.  MEDICATIONS Medications self-administered by patient taken the night of the study : N/A  TECHNICIAN COMMENTS Comments added by technician: NONE Comments added by scorer: N/A  RESPIRATORY PARAMETERS Optimal PAP Pressure (cm):13  AHI at Optimal Pressure (/hr):0.0 Overall Minimal O2 (%):89.0  Supine % at Optimal Pressure (%):0 Minimal O2 at Optimal Pressure (%): 89.0   SLEEP ARCHITECTURE The study was initiated at 10:33:43 PM and ended at 5:01:36 AM.  Sleep onset time was 28.8 minutes and the sleep efficiency was 65.6%%. The total sleep time was 254.5 minutes.  The patient spent 2.4% of the night in stage N1 sleep, 77.6% in stage N2 sleep, 0.0% in stage N3 and 20% in REM.Stage REM latency was 183.5 minutes  Wake after sleep onset was 104.6. Alpha intrusion was absent. Supine sleep was 0.00%.  CARDIAC DATA The 2 lead EKG demonstrated sinus rhythm. The mean heart rate was 49.1 beats per minute. Other EKG findings include: PVCs and PACs.  LEG MOVEMENT  DATA The total Periodic Limb Movements of Sleep (PLMS) were 0. The PLMS index was 0.0. A PLMS index of <15 is considered normal in adults.  IMPRESSIONS - The optimal PAP pressure was 13 cm of water. - Central sleep apnea was not noted during this titration (CAI = 0.0/h). - Mild oxygen desaturations were observed during this titration (min O2 = 89.0%). - No snoring was audible during this study. - PACs and PVCs were observed during this study. - Clinically significant periodic limb movements were not noted during this study. Arousals associated with PLMs were rare.  DIAGNOSIS - Obstructive Sleep Apnea (327.23 [G47.33 ICD-10])  RECOMMENDATIONS - Trial of CPAP therapy on 13 cm H2O with a Small size Fisher&Paykel Full Face Mask Simplus mask and heated humidification. - Avoid alcohol, sedatives and other CNS depressants that may worsen sleep apnea and disrupt normal sleep architecture. - Sleep hygiene should be reviewed to assess factors that may improve sleep quality. - Weight management and regular exercise should be initiated or continued. - Return to Sleep Center for re-evaluation after 10 weeks of therapy  [Electronically signed] 08/12/2019 10:13 PM  Fransico Him MD, ABSM Diplomate, American Board of Sleep Medicine

## 2019-08-12 NOTE — Progress Notes (Signed)
Primary Care Physician: Lavone Orn, MD Referring Physician: Dr. Janell Quiet Shannon Liu is a 78 y.o. female with h/o paroxysmal afib    diagnosed 4 years ago and has had very infrequent episodes.She is in the afib clinic for noting afib 2-3 times this week.  She would have resolution of symptoms several hours after taking prn diltiazem. She continues on daily Cardizem. Does have snoring/witnessed apnea episodes. She has also noted increase in BP over the last several weeks.   F/u in afib clinic, pt reports only one mild episode  of afib over the last 10 days, but her BP still is not optimal. She called the office last Tuesday with BP's not controlled and was told to increase to increase  valsartan/hctz from 160/12.5 to 320/25 mg qd. Her BP at home this am was [id number] systolic but in office presented at 160/80, when I rechecked 154/80. I had her to stop amlodipine as cardizem was increased.   She was then see f/u for BP check and BP still elevated and was placed on metoprolol 25 mg at hs. This  has helped her BP. Had 2 hours of afib this am, first episode afib in about one month. Came on at 4:30 this am. She is pending results of her sleep study done 9/22.   F/u in afib clinic 08/12/19, she reports that she feels well. No afib episodes of afib to report. Her heart rates are in the 50's now with more rate control to address afib, but she is not symptomatic with this. Her  BP was elevated on presentation but on recheck 134/80.Today, she denies symptoms of palpitations, chest pain, shortness of breath, orthopnea, PND, lower extremity edema, dizziness, presyncope, syncope, or neurologic sequela. The patient is tolerating medications without difficulties and is otherwise without complaint today.   Past Medical History:  Diagnosis Date  . Arthritis   . Heart murmur   . HOH (hard of hearing)   . HTN (hypertension)    since pregnant at age 65  . Hyperlipemia   . PAF (paroxysmal atrial  fibrillation) ([place])    a. dx on 06/2015 admission. spontaneously converted into NSR. placed on Eliquis   Past Surgical History:  Procedure Laterality Date  . ABDOMINAL HYSTERECTOMY    . BREAST CYST EXCISION Left   . BREAST SURGERY Left    lumpectomy 35 yrs ago  . NECK SURGERY     discectomy with cadaver bone    Current Outpatient Medications  Medication Sig Dispense Refill  . acetaminophen (TYLENOL) 500 MG tablet Take 500 mg by mouth every 6 (six) hours as needed for mild pain.    Marland Kitchen ALPRAZolam (XANAX) 0.25 MG tablet Take 0.25 mg by mouth 2 (two) times daily as needed for anxiety.    Marland Kitchen atorvastatin (LIPITOR) 20 MG tablet Take 1 tablet (20 mg total) by mouth daily. 90 tablet 3  . diltiazem (CARDIZEM CD) 120 MG 24 hr capsule Take 1 capsule (120 mg total) by mouth 2 (two) times daily. 180 capsule 2  . diltiazem (CARDIZEM) 30 MG tablet Take 1 tablet every 4 hours AS NEEDED for AFIB heart rate >100 45 tablet 1  . ELIQUIS 5 MG TABS tablet TAKE 1 TABLET BY MOUTH TWICE A DAY 180 tablet 1  . metoprolol succinate (TOPROL XL) 25 MG 24 hr tablet Take 1 tablet (25 mg total) by mouth at bedtime. (Patient taking differently: Take 12.5 mg by mouth at bedtime. ) 30 tablet 3  . Multiple  Vitamins-Minerals (MULTIVITAMIN WITH MINERALS) tablet Take 1 tablet by mouth daily.    . potassium chloride (K-DUR) 10 MEQ tablet Take 10 mEq by mouth daily. with food    . Propylene Glycol (SYSTANE BALANCE) 0.6 % SOLN Place 1 drop into both eyes at bedtime.     . valsartan-hydrochlorothiazide (DIOVAN HCT) 320-25 MG tablet Take 1 tablet by mouth daily. 30 tablet 3   No current facility-administered medications for this encounter.     Allergies  Allergen Reactions  . Morphine And Related Nausea And Vomiting  . Penicillins Nausea And Vomiting    Social History   Socioeconomic History  . Marital status: Widowed    Spouse name: Not on file  . Number of children: Not on file  . Years of education: Not on file  .  Highest education level: Not on file  Occupational History  . Occupation: Retired  Scientific laboratory technician  . Financial resource strain: Not on file  . Food insecurity    Worry: Not on file    Inability: Not on file  . Transportation needs    Medical: Not on file    Non-medical: Not on file  Tobacco Use  . Smoking status: Never Smoker  . Smokeless tobacco: Never Used  Substance and Sexual Activity  . Alcohol use: No    Alcohol/week: 0.0 standard drinks  . Drug use: No  . Sexual activity: Not on file  Lifestyle  . Physical activity    Days per week: Not on file    Minutes per session: Not on file  . Stress: Not on file  Relationships  . Social Herbalist on phone: Not on file    Gets together: Not on file    Attends religious service: Not on file    Active member of club or organization: Not on file    Attends meetings of clubs or organizations: Not on file    Relationship status: Not on file  . Intimate partner violence    Fear of current or ex partner: Not on file    Emotionally abused: Not on file    Physically abused: Not on file    Forced sexual activity: Not on file  Other Topics Concern  . Not on file  [place], near [place]. Daughter currently lives with her.    Family History  Problem Relation Age of Onset  . Heart attack Mother 7  . CVA Mother 81  . Unexplained death Father 35  . Hypertension Sister   . Hypertension Brother   . Breast cancer Neg Hx     ROS- All systems are reviewed and negative except as per the HPI above  Physical Exam: Vitals:   08/12/19 1030  BP: (!) 176/76  Pulse: (!) 55  Weight: 94.4 kg  Height: 5\' 7"  (1.702 m)   Wt Readings from Last 3 Encounters:  08/12/19 94.4 kg  08/08/19 93.4 kg  07/08/19 93.3 kg    Labs: Lab Results  Component Value Date   NA 140 06/14/2019   K 4.2 06/14/2019   CL 103 06/14/2019   CO2 27 06/14/2019   GLUCOSE 109 (H) 06/14/2019   BUN 12  06/14/2019   CREATININE 0.80 06/14/2019   CALCIUM 9.7 06/14/2019   MG 2.0 06/23/2015   Lab Results  Component Value Date   INR 1.18 06/23/2015   Lab Results  Component Value Date   CHOL 142 06/24/2015   HDL  36 (L) 06/24/2015   LDLCALC 82 06/24/2015   TRIG 118 06/24/2015     GEN- The patient is well appearing, alert and oriented x 3 today.   Head- normocephalic, atraumatic Eyes-  Sclera clear, conjunctiva pink Ears- hearing intact Oropharynx- clear Neck- supple, no JVP Lymph- no cervical lymphadenopathy Lungs- Clear to ausculation bilaterally, normal work of breathing Heart- Regular rate and rhythm, no murmurs, rubs or gallops, PMI not laterally displaced GI- soft, NT, ND, + BS Extremities- no clubbing, cyanosis, or edema MS- no significant deformity or atrophy Skin- no rash or lesion Psych- euthymic mood, full affect Neuro- strength and sensation are intact  EKG- NSR at 55  bpm, pr int 168 ms, qrs int 76 ms, qtc 40 ms Echo- 2016-Study Conclusions  - Left ventricle: The cavity size was normal. Wall thickness was   increased in a pattern of moderate LVH. Systolic function was   mildly reduced. The estimated ejection fraction was in the range   of 45% to 50%. Regional wall motion abnormalities cannot be   excluded. Doppler parameters are consistent with abnormal left   ventricular relaxation (grade 1 diastolic dysfunction). - Mitral valve: There was mild regurgitation. - Atrial septum: No defect or patent foramen ovale was identified. - Pulmonary arteries: PA peak pressure: 31 mm Hg (S).   Assessment and Plan: 1. Paroxysmal afib   Afib burden very low with increase in rate control Continue  cardizem 120 mg bid  Continue metoprolol 25 mg, 1/2 tab at hs   2. CHA2DS2VASc score of at least  4 Continue eliquis 5 mg bid  3. OSA Recently diagnosed Pending to be set up with cpap  F/u with PCP next week Dr. Marlou Porch per recall Afib clinic as needed   Butch Penny C.  Jannell Franta, [facility] [address] [place], [place] [address] [phone]

## 2019-08-14 ENCOUNTER — Other Ambulatory Visit: Payer: Self-pay | Admitting: Cardiology

## 2019-08-14 ENCOUNTER — Telehealth: Payer: Self-pay | Admitting: *Deleted

## 2019-08-14 NOTE — Telephone Encounter (Signed)
Informed patient of sleep study results and patient understanding was verbalized. Patient understands her sleep study showed they had a successful PAP titration and let DME know that orders are in EPIC. Please set up 10 week OV with me.   Upon patient request DME selection is [facility]. Patient understands she will be contacted by [facility] to set up her cpap. Patient understands to call if [facility] does not contact her with new setup in a timely manner. Patient understands they will be called once confirmation has been received from [facility] that they have received their new machine to schedule 10 week follow up appointment.  [facility] notified of new cpap order  Please add to airview Patient was grateful for the call and thanked me.   

## 2019-08-14 NOTE — Telephone Encounter (Signed)
-----   Message from Sueanne Margarita, MD sent at 08/12/2019 10:16 PM EDT ----- Please let patient know that they had a successful PAP titration and let DME know that orders are in EPIC.  Please set up 10 week OV with me.

## 2019-08-23 NOTE — Telephone Encounter (Signed)
Per [facility] patient's insurance will change in December so she has been referred to [facility] for her dme.

## 2019-09-28 ENCOUNTER — Other Ambulatory Visit: Payer: Self-pay

## 2019-09-28 ENCOUNTER — Ambulatory Visit
Admission: RE | Admit: 2019-09-28 | Discharge: 2019-09-28 | Disposition: A | Discharge status: Home / Self Care | Payer: Medicare Other | Source: Ambulatory Visit | Attending: Internal Medicine | Admitting: Internal Medicine

## 2019-09-28 DIAGNOSIS — Z1231 Encounter for screening mammogram for malignant neoplasm of breast: Secondary | ICD-10-CM

## 2019-10-01 ENCOUNTER — Other Ambulatory Visit: Payer: Self-pay | Admitting: Cardiology

## 2019-10-02 ENCOUNTER — Other Ambulatory Visit (HOSPITAL_COMMUNITY): Payer: Self-pay | Admitting: Physician Assistant

## 2019-10-04 ENCOUNTER — Other Ambulatory Visit: Payer: Self-pay | Admitting: Cardiology

## 2019-10-04 NOTE — Telephone Encounter (Signed)
Pt last saw Roderic Palau, NP 08/12/19, last labs 06/14/19 Creat 0.8, age 78, weight 94.4kg, based on specified criteria pt is on appropriate dosage of Eliquis 5mg  BID.  Will refill rx.

## 2019-10-26 ENCOUNTER — Ambulatory Visit: Payer: Medicare Other | Attending: Internal Medicine

## 2019-10-26 DIAGNOSIS — Z23 Encounter for immunization: Secondary | ICD-10-CM

## 2019-10-26 NOTE — Progress Notes (Signed)
   Covid-19 Vaccination Clinic  Name:  Carey Lone    MRN: [id number] DOB: 08-29-1941  10/26/2019  Ms. Levings was observed post Covid-19 immunization for 30 minutes based on pre-vaccination screening without incidence. She was provided with Vaccine Information Sheet and instruction to access the V-Safe system.   Ms. Cruzan was instructed to call 911 with any severe reactions post vaccine: Marland Kitchen Difficulty breathing  . Swelling of your face and throat  . A fast heartbeat  . A bad rash all over your body  . Dizziness and weakness    Immunizations Administered    Name Date Dose VIS Date Route   Pfizer COVID-19 Vaccine 10/26/2019 12:07 PM 0.3 mL 09/24/2019 Intramuscular   Manufacturer: [place]   Lot: [id number]   [place]: [id number]

## 2019-11-14 ENCOUNTER — Ambulatory Visit: Payer: Medicare PPO | Attending: Internal Medicine

## 2019-11-14 DIAGNOSIS — Z23 Encounter for immunization: Secondary | ICD-10-CM | POA: Insufficient documentation

## 2019-11-14 NOTE — Progress Notes (Signed)
   Covid-19 Vaccination Clinic  Name:  Shannon Liu    MRN: [id number] DOB: [birth year]  11/14/2019  Shannon Liu was observed post Covid-19 immunization for 15 minutes without incidence. She was provided with Vaccine Information Sheet and instruction to access the V-Safe system.   Shannon Liu was instructed to call 911 with any severe reactions post vaccine: Marland Kitchen Difficulty breathing  . Swelling of your face and throat  . A fast heartbeat  . A bad rash all over your body  . Dizziness and weakness    Immunizations Administered    Name Date Dose VIS Date Route   Pfizer COVID-19 Vaccine 11/14/2019  9:44 AM 0.3 mL 09/24/2019 Intramuscular   Manufacturer: [place]   Lot: [id number]   [place]: [id number]

## 2019-12-28 DIAGNOSIS — G4733 Obstructive sleep apnea (adult) (pediatric): Secondary | ICD-10-CM | POA: Diagnosis not present

## 2020-01-28 DIAGNOSIS — G4733 Obstructive sleep apnea (adult) (pediatric): Secondary | ICD-10-CM | POA: Diagnosis not present

## 2020-02-04 DIAGNOSIS — H8110 Benign paroxysmal vertigo, unspecified ear: Secondary | ICD-10-CM | POA: Diagnosis not present

## 2020-02-08 DIAGNOSIS — H8112 Benign paroxysmal vertigo, left ear: Secondary | ICD-10-CM | POA: Diagnosis not present

## 2020-02-08 DIAGNOSIS — K5903 Drug induced constipation: Secondary | ICD-10-CM | POA: Diagnosis not present

## 2020-02-08 DIAGNOSIS — I10 Essential (primary) hypertension: Secondary | ICD-10-CM | POA: Diagnosis not present

## 2020-02-09 ENCOUNTER — Encounter: Payer: Self-pay | Admitting: Cardiology

## 2020-02-09 ENCOUNTER — Ambulatory Visit (INDEPENDENT_AMBULATORY_CARE_PROVIDER_SITE_OTHER): Payer: Medicare PPO | Admitting: Cardiology

## 2020-02-09 ENCOUNTER — Other Ambulatory Visit: Payer: Self-pay

## 2020-02-09 VITALS — BP 134/60 | HR 87 | Ht 67.0 in | Wt 208.0 lb

## 2020-02-09 DIAGNOSIS — E7800 Pure hypercholesterolemia, unspecified: Secondary | ICD-10-CM | POA: Diagnosis not present

## 2020-02-09 DIAGNOSIS — Z7901 Long term (current) use of anticoagulants: Secondary | ICD-10-CM | POA: Diagnosis not present

## 2020-02-09 DIAGNOSIS — I480 Paroxysmal atrial fibrillation: Secondary | ICD-10-CM

## 2020-02-09 DIAGNOSIS — A881 Epidemic vertigo: Secondary | ICD-10-CM | POA: Diagnosis not present

## 2020-02-09 DIAGNOSIS — I10 Essential (primary) hypertension: Secondary | ICD-10-CM

## 2020-02-09 DIAGNOSIS — R262 Difficulty in walking, not elsewhere classified: Secondary | ICD-10-CM | POA: Diagnosis not present

## 2020-02-09 NOTE — Patient Instructions (Signed)
Medication Instructions:  The current medical regimen is effective;  continue present plan and medications.  *If you need a refill on your cardiac medications before your next appointment, please call your pharmacy*  Follow-Up: At [facility], you and your health needs are our priority.  As part of our continuing mission to provide you with exceptional heart care, we have created designated Provider Care Teams.  These Care Teams include your primary Cardiologist (physician) and Advanced Practice Providers (APPs -  Physician Assistants and Nurse Practitioners) who all work together to provide you with the care you need, when you need it.  We recommend signing up for the patient portal called "MyChart".  Sign up information is provided on this After Visit Summary.  MyChart is used to connect with patients for Virtual Visits (Telemedicine).  Patients are able to view lab/test results, encounter notes, upcoming appointments, etc.  Non-urgent messages can be sent to your provider as well.   To learn more about what you can do with MyChart, go to [email or web address].    Your next appointment:   12 month(s)  The format for your next appointment:   In Person  Provider:   Mark Skains, MD   Thank you for choosing [facility]!!      

## 2020-02-09 NOTE — Progress Notes (Signed)
Cardiology Office Note:    Date:  02/09/2020   ID:  Shannon Liu, DOB 04-26-1941, MRN [id number]  PCP:  Lavone Orn, MD  Cardiologist:  Candee Furbish, MD  Electrophysiologist:  None   Referring MD: Lavone Orn, MD    History of Present Illness:    Shannon Liu is a 79 y.o. female here for the follow-up of atrial fibrillation paroxysmal.  Went to A. fib clinic.  Vertigo.  Has been present for about 5 to 6 days.  Called EMS.  They showed me the EKG strip which was sinus rhythm with PACs at the time.  EMT did a thorough evaluation.  She is also seen Dr. Delene Ruffini team.  She is going to physical therapy soon.  osa-CPAP.  Doing well.     Past Medical History:  Diagnosis Date  . Arthritis   . Heart murmur   . HOH (hard of hearing)   . HTN (hypertension)    since pregnant at age 46  . Hyperlipemia   . PAF (paroxysmal atrial fibrillation) ([place])    a. dx on 06/2015 admission. spontaneously converted into NSR. placed on Eliquis    Past Surgical History:  Procedure Laterality Date  . ABDOMINAL HYSTERECTOMY    . BREAST CYST EXCISION Left   . BREAST SURGERY Left    lumpectomy 35 yrs ago  . NECK SURGERY     discectomy with cadaver bone    Current Medications: Current Meds  Medication Sig  . acetaminophen (TYLENOL) 500 MG tablet Take 500 mg by mouth every 6 (six) hours as needed for mild pain.  Marland Kitchen ALPRAZolam (XANAX) 0.25 MG tablet Take 0.25 mg by mouth 2 (two) times daily as needed for anxiety.  Marland Kitchen amLODipine (NORVASC) 5 MG tablet Take 5 mg by mouth daily.  Marland Kitchen atorvastatin (LIPITOR) 20 MG tablet TAKE 1 TABLET BY MOUTH EVERY DAY  . chlorthalidone (HYGROTON) 25 MG tablet Take 25 mg by mouth daily.  Marland Kitchen diltiazem (CARDIZEM CD) 120 MG 24 hr capsule Take 1 capsule (120 mg total) by mouth 2 (two) times daily.  Marland Kitchen diltiazem (CARDIZEM) 30 MG tablet Take 1 tablet every 4 hours AS NEEDED for AFIB heart rate >100  . ELIQUIS 5 MG TABS tablet TAKE 1 TABLET BY MOUTH TWICE A DAY   . Multiple Vitamins-Minerals (MULTIVITAMIN WITH MINERALS) tablet Take 1 tablet by mouth daily.  . potassium chloride (K-DUR) 10 MEQ tablet Take 10 mEq by mouth daily. with food  . Propylene Glycol (SYSTANE BALANCE) 0.6 % SOLN Place 1 drop into both eyes at bedtime.   Marland Kitchen telmisartan (MICARDIS) 80 MG tablet Take 80 mg by mouth daily.     Allergies:   Morphine and related and Penicillins   Social History   Socioeconomic History  . Marital status: Widowed    Spouse name: Not on file  . Number of children: Not on file  . Years of education: Not on file  . Highest education level: Not on file  Occupational History  . Occupation: Retired  Tobacco Use  . Smoking status: Never Smoker  . Smokeless tobacco: Never Used  Substance and Sexual Activity  . Alcohol use: No    Alcohol/week: 0.0 standard drinks  . Drug use: No  . Sexual activity: Not on file  Other Topics Concern  . Not on file  [place], near [place]. Daughter currently lives with her.   Social Determinants of Health   Financial Resource Strain:   .  Difficulty of Paying Living Expenses:   Food Insecurity:   . Worried About Charity fundraiser in the Last Year:   . Arboriculturist in the Last Year:   Transportation Needs:   . Film/video editor (Medical):   Marland Kitchen Lack of Transportation (Non-Medical):   Physical Activity:   . Days of Exercise per Week:   . Minutes of Exercise per Session:   Stress:   . Feeling of Stress :   Social Connections:   . Frequency of Communication with Friends and Family:   . Frequency of Social Gatherings with Friends and Family:   . Attends Religious Services:   . Active Member of Clubs or Organizations:   . Attends Archivist Meetings:   Marland Kitchen Marital Status:      Family History: The patient's family history includes CVA (age of onset: 62) in her mother; Heart attack (age of onset: 56) in her mother; Hypertension in her brother  and sister; Unexplained death (age of onset: 49) in her father. There is no history of Breast cancer.   EKGs/Labs/Other Studies Reviewed:     EKG: EKG from EMT reviewed as above.  Sinus rhythm PACs  Recent Labs: 06/14/2019: BUN 12; Creatinine, Ser 0.80; Potassium 4.2; Sodium 140  Recent Lipid Panel    Component Value Date/Time   CHOL 142 06/24/2015 0329   TRIG 118 06/24/2015 0329   HDL 36 (L) 06/24/2015 0329   CHOLHDL 3.9 06/24/2015 0329   VLDL 24 06/24/2015 0329   LDLCALC 82 06/24/2015 0329    Physical Exam:    VS:  BP 134/60   Pulse 87   Ht 5\' 7"  (1.702 m)   Wt 208 lb (94.3 kg)   SpO2 98%   BMI 32.58 kg/m     Wt Readings from Last 3 Encounters:  02/09/20 208 lb (94.3 kg)  08/12/19 208 lb 3.2 oz (94.4 kg)  08/08/19 206 lb (93.4 kg)     GEN:  Well nourished, well developed in no acute distress HEENT: Normal NECK: No JVD; No carotid bruits LYMPHATICS: No lymphadenopathy CARDIAC: RRR, no murmurs, rubs, gallops RESPIRATORY:  Clear to auscultation without rales, wheezing or rhonchi  ABDOMEN: Soft, non-tender, non-distended MUSCULOSKELETAL:  No edema; No deformity  SKIN: Warm and dry NEUROLOGIC:  Alert and oriented x 3 PSYCHIATRIC:  Normal affect   ASSESSMENT:    1. PAF (paroxysmal atrial fibrillation) ([place])   2. Essential hypertension   3. Pure hypercholesterolemia   4. Chronic anticoagulation    PLAN:    In order of problems listed above:  Paroxysmal atrial fibrillation -No recent episodes.  Doing well with diltiazem CD 120 mg twice a day.  Okay to take amlodipine as well.  Dr. Laurann Montana and I discussed.  She is no longer on the low-dose metoprolol.  This is fine.  Chronic anticoagulation -Continue with Eliquis CHA2DS2-VASc 4.  Obstructive sleep apnea -CPAP.  Doing well.  Vertigo -Going to physical therapy today.  Hemoglobin 14.4 creatinine 0.73   Medication Adjustments/Labs and Tests Ordered: Current medicines are reviewed at length with the  patient today.  Concerns regarding medicines are outlined above.  No orders of the defined types were placed in this encounter.  No orders of the defined types were placed in this encounter.   Patient Instructions  Medication Instructions:  The current medical regimen is effective;  continue present plan and medications.  *If you need a refill on your cardiac medications before your next appointment, please call  your pharmacy*  Follow-Up: At [facility], you and your health needs are our priority.  As part of our continuing mission to provide you with exceptional heart care, we have created designated Provider Care Teams.  These Care Teams include your primary Cardiologist (physician) and Advanced Practice Providers (APPs -  Physician Assistants and Nurse Practitioners) who all work together to provide you with the care you need, when you need it.  We recommend signing up for the patient portal called "MyChart".  Sign up information is provided on this After Visit Summary.  MyChart is used to connect with patients for Virtual Visits (Telemedicine).  Patients are able to view lab/test results, encounter notes, upcoming appointments, etc.  Non-urgent messages can be sent to your provider as well.   To learn more about what you can do with MyChart, go to [email or web address].    Your next appointment:   12 month(s)  The format for your next appointment:   In Person  Provider:   Candee Furbish, MD   Thank you for choosing [facility]!!        Signed, Candee Furbish, MD  02/09/2020 12:20 PM    [place]

## 2020-02-14 ENCOUNTER — Other Ambulatory Visit: Payer: Self-pay

## 2020-02-14 ENCOUNTER — Encounter: Payer: Self-pay | Admitting: Physical Therapy

## 2020-02-14 ENCOUNTER — Ambulatory Visit: Payer: Medicare PPO | Attending: Internal Medicine | Admitting: Physical Therapy

## 2020-02-14 DIAGNOSIS — M542 Cervicalgia: Secondary | ICD-10-CM | POA: Diagnosis not present

## 2020-02-14 DIAGNOSIS — R42 Dizziness and giddiness: Secondary | ICD-10-CM | POA: Insufficient documentation

## 2020-02-14 DIAGNOSIS — H8113 Benign paroxysmal vertigo, bilateral: Secondary | ICD-10-CM | POA: Diagnosis not present

## 2020-02-14 NOTE — Therapy (Signed)
[address] [place], [place], [address] Phone: [phone]   Fax:  [phone]  Physical Therapy Evaluation  Patient Details  Name: Shannon Liu MRN: [id number] Date of Birth: Mar 24, 1941 Referring Provider (PT): Laurann Montana   Encounter Date: 02/14/2020  PT End of Session - 02/14/20 1721    Visit Number  1    Date for PT Re-Evaluation  04/15/20    Authorization Type  [employer]    PT Start Time  1300    PT Stop Time  1358    PT Time Calculation (min)  58 min    Activity Tolerance  Patient tolerated treatment well    Behavior During Therapy  [facility] for tasks assessed/performed       Past Medical History:  Diagnosis Date  . Arthritis   . Heart murmur   . HOH (hard of hearing)   . HTN (hypertension)    since pregnant at age 22  . Hyperlipemia   . PAF (paroxysmal atrial fibrillation) ([place])    a. dx on 06/2015 admission. spontaneously converted into NSR. placed on Eliquis    Past Surgical History:  Procedure Laterality Date  . ABDOMINAL HYSTERECTOMY    . BREAST CYST EXCISION Left   . BREAST SURGERY Left    lumpectomy 35 yrs ago  . NECK SURGERY     discectomy with cadaver bone    There were no vitals filed for this visit.   Subjective Assessment - 02/14/20 1310    Subjective  Patient reports an episode of vertigo 30 years ago and it resolved quickly.  Patient reports that about 11 days ago she turned to look at the clock while in bed and she had severe vertigo, reports that she was so dizzy she could not walk.  EMS was called and they ruled out a stroke and a heart attack.  She reports that she went to see a PT once but it was a long way from her house.  She reports a weak right eye.    Limitations  Walking;Standing;House hold activities    Patient Stated Goals  stop being dizzy    Currently in Pain?  No/denies         [facility] PT Assessment - 02/14/20 0001      Assessment   Medical Diagnosis   BPPV, cervicalgia    Referring Provider (PT)  Laurann Montana    Onset Date/Surgical Date  01/31/20    Prior Therapy  1 visit      Precautions   Precautions  None      Balance Screen   Has the patient fallen in the past 6 months  No    Has the patient had a decrease in activity level because of a fear of falling?   No    Is the patient reluctant to leave their home because of a fear of falling?   No      Home Environment   Additional Comments  does housework, does some yardwork      Prior Function   Level of Independence  Independent    Vocation  Retired    Leisure  2 x/week exercise class      Posture/Postural Control   Posture Comments  fwd head rounded      ROM / Strength   AROM / PROM / Strength  AROM;Strength      AROM   Overall AROM Comments  cervical ROM is decreased 50% with pain and  tightness in the neck      Strength   Overall Strength Comments  UE 4-/5 with some neck pain      Palpation   Palpation comment  she is very tight in the upper traps, the cervical paraspinals and into the rhomboids           Vestibular Assessment - 02/14/20 0001      Symptom Behavior   Type of Dizziness   Vertigo    Frequency of Dizziness  getting up, was constant for a week, then now about a minute    Symptom Nature  Spontaneous    Aggravating Factors  Sit to stand;Turning head quickly    Relieving Factors  Rest;Medication    Progression of Symptoms  Better      Vestibulo-Ocular Reflex   VOR 1 Head Only (x 1 viewing)  negative    VOR 2 Head and Object (x 2 viewing)  negative    VOR to Slow Head Movement  Normal      Positional Testing   Dix-Hallpike  Dix-Hallpike Right;Dix-Hallpike Left      Dix-Hallpike Right   Dix-Hallpike Right Duration  negative      Dix-Hallpike Left   Dix-Hallpike Left Duration  negative          Objective measurements completed on examination: See above findings.      [facility] Adult PT Treatment/Exercise - 02/14/20 0001      Modalities    Modalities  Electrical Stimulation;Moist Heat      Moist Heat Therapy   Number Minutes Moist Heat  10 Minutes    Moist Heat Location  Cervical      Electrical Stimulation   Electrical Stimulation Location  cervical    Electrical Stimulation Action  IFC    Electrical Stimulation Parameters  sitting    Electrical Stimulation Goals  Pain             PT Education - 02/14/20 1438    Education Details  gave HEP for Brandt-Daroff    Person(s) Educated  Patient    Methods  Explanation;Demonstration;Handout;Verbal cues    Comprehension  Verbalized understanding       PT Short Term Goals - 02/14/20 1734      PT SHORT TERM GOAL #1   Title  independent with initial HEP    Time  3    Period  Weeks    Status  New        PT Long Term Goals - 02/14/20 1735      PT LONG TERM GOAL #1   Title  independent with advanced HEP including vestibular exercises    Time  8    Period  Weeks    Status  New      PT LONG TERM GOAL #2   Title  decrease neck pain 25%    Time  8    Period  Weeks    Status  New      PT LONG TERM GOAL #3   Title  increase cervical ROM 25%    Time  8    Period  Weeks    Status  New             Plan - 02/14/20 1730    Clinical Impression Statement  Patient reports dizziness for the past 11 days, she also reports neck pain and stiffness for 6+ months.  She has a history of ACDF.  Reports that 11 days ago she woke up and turned  her head to the right to look at the alarm clock and the clock started spinning, she reports that she became nauseated as it would not stop, she reports that the dizziness has not really stopped but is overall better, she had one PT treatment across town but would like to start here as it is closer to her home.  She has limited cervical ROM, very tight and tender mms of the neck and upper traps.  She did well with the VOR activities with slight dizziness increased with cancellation.  She had no real response to the Epley test, but  reports that she has been doing it on her own to both sides.    Stability/Clinical Decision Making  Evolving/Moderate complexity    Clinical Decision Making  Moderate    Rehab Potential  Good    PT Frequency  2x / week    PT Duration  8 weeks    PT Treatment/Interventions  ADLs/Self Care Home Management;Visual/perceptual remediation/compensation;Vestibular;Patient/family education;Electrical Stimulation;Traction;Therapeutic exercise;Manual techniques    PT Next Visit Plan  work on balance unless she is actively dizzy, treat the neck pain and spasms    Consulted and Agree with Plan of Care  Patient       Patient will benefit from skilled therapeutic intervention in order to improve the following deficits and impairments:  Dizziness, Decreased range of motion, Pain, Increased muscle spasms, Decreased balance  Visit Diagnosis: BPPV (benign paroxysmal positional vertigo), bilateral - Plan: PT plan of care cert/re-cert  Dizziness and giddiness - Plan: PT plan of care cert/re-cert  Cervicalgia - Plan: PT plan of care cert/re-cert     Problem List Patient Active Problem List   Diagnosis Date Noted  . Chronic anticoagulation 07/04/2015  . HOH (hard of hearing)   . Hyperlipemia   . PAF (paroxysmal atrial fibrillation) ([place]) 06/23/2015  . HTN (hypertension)     Sumner Boast., PT 02/14/2020, 5:39 PM  [address] [place], [place], [address] Phone: [phone]   Fax:  [phone]  Name: Shannon Liu MRN: [id number] Date of Birth: [birth year]

## 2020-02-21 ENCOUNTER — Other Ambulatory Visit: Payer: Self-pay | Admitting: Cardiology

## 2020-02-21 ENCOUNTER — Other Ambulatory Visit (HOSPITAL_COMMUNITY): Payer: Self-pay | Admitting: Nurse Practitioner

## 2020-02-21 NOTE — Telephone Encounter (Signed)
Eliquis 5mg  refill request received. Patient is 79 years old, weight-94.3kg, Crea-0.80 on 06/14/2019, Diagnosis-Afib, and last seen by Dr. Marlou Porch on 02/09/2020. Dose is appropriate based on dosing criteria. Will send in refill to requested pharmacy.

## 2020-02-22 ENCOUNTER — Ambulatory Visit: Payer: Medicare PPO | Admitting: Physical Therapy

## 2020-02-22 ENCOUNTER — Other Ambulatory Visit: Payer: Self-pay

## 2020-02-22 ENCOUNTER — Encounter: Payer: Self-pay | Admitting: Physical Therapy

## 2020-02-22 DIAGNOSIS — M542 Cervicalgia: Secondary | ICD-10-CM

## 2020-02-22 DIAGNOSIS — R42 Dizziness and giddiness: Secondary | ICD-10-CM | POA: Diagnosis not present

## 2020-02-22 DIAGNOSIS — H8113 Benign paroxysmal vertigo, bilateral: Secondary | ICD-10-CM

## 2020-02-22 NOTE — Therapy (Signed)
[address] [place], [place], [address] Phone: [phone]   Fax:  [phone]  Physical Therapy Treatment  Patient Details  Name: Shannon Liu MRN: [id number] Date of Birth: 29-May-1941 Referring Provider (PT): Laurann Montana   Encounter Date: 02/22/2020  PT End of Session - 02/22/20 1705    Visit Number  2    Number of Visits  12    Date for PT Re-Evaluation  04/15/20    Authorization Type  [employer]    PT Start Time  1613    PT Stop Time  1715    PT Time Calculation (min)  62 min    Activity Tolerance  Patient tolerated treatment well    Behavior During Therapy  [facility] for tasks assessed/performed       Past Medical History:  Diagnosis Date  . Arthritis   . Heart murmur   . HOH (hard of hearing)   . HTN (hypertension)    since pregnant at age 33  . Hyperlipemia   . PAF (paroxysmal atrial fibrillation) ([place])    a. dx on 06/2015 admission. spontaneously converted into NSR. placed on Eliquis    Past Surgical History:  Procedure Laterality Date  . ABDOMINAL HYSTERECTOMY    . BREAST CYST EXCISION Left   . BREAST SURGERY Left    lumpectomy 35 yrs ago  . NECK SURGERY     discectomy with cadaver bone    There were no vitals filed for this visit.  Subjective Assessment - 02/22/20 1619    Subjective  Patient reports that her dizziness is better, but reports that she still has issues with bending over and busy areas feels a little off. reports stiff and tight in the neck    Currently in Pain?  Yes    Pain Score  7     Pain Location  Neck    Pain Orientation  Left;Right    Pain Descriptors / Indicators  Aching;Spasm    Pain Type  Acute pain    Pain Onset  More than a month ago    Pain Frequency  Constant    Aggravating Factors   reports difficulty with turning head due to pain    Pain Relieving Factors  heat                       [facility] Adult PT Treatment/Exercise - 02/22/20 0001       High Level Balance   High Level Balance Comments  ball toss solid surface, ball toss dynamic surface, step over something and onto dynamic surface forward and side ways, walking ball toss, on airex eyes closed, 6" toe touches      Exercises   Exercises  Neck      Neck Exercises: Machines for Strengthening   UBE (Upper Arm Bike)  level 1 x 4 minutes, changing direction every minute      Neck Exercises: Theraband   Shoulder Extension  20 reps;Red    Rows  20 reps;Red    Shoulder External Rotation  20 reps;Red      Modalities   Modalities  Electrical Stimulation;Moist Heat      Moist Heat Therapy   Number Minutes Moist Heat  10 Minutes    Moist Heat Location  Cervical      Electrical Stimulation   Electrical Stimulation Location  cervical    Electrical Stimulation Action  IFC    Electrical Stimulation Parameters  sitting    Electrical Stimulation Goals  Pain      Manual Therapy   Manual Therapy  Soft tissue mobilization    Soft tissue mobilization  focus on the left upper trap, cervical and rhomboid area             PT Education - 02/22/20 1705    Education Details  gave VOR exercises    Person(s) Educated  Patient    Methods  Explanation;Demonstration;Handout;Verbal cues    Comprehension  Verbalized understanding       PT Short Term Goals - 02/22/20 1713      PT SHORT TERM GOAL #1   Title  independent with initial HEP    Status  Achieved        PT Long Term Goals - 02/14/20 1735      PT LONG TERM GOAL #1   Title  independent with advanced HEP including vestibular exercises    Time  8    Period  Weeks    Status  New      PT LONG TERM GOAL #2   Title  decrease neck pain 25%    Time  8    Period  Weeks    Status  New      PT LONG TERM GOAL #3   Title  increase cervical ROM 25%    Time  8    Period  Weeks    Status  New            Plan - 02/22/20 1706    Clinical Impression Statement  Patient reports a little better with dizziness,  still issues with turning or looking down.  She is very tight with spasms in the left upper trap and the neck.  Reported feeling much better after the treatment, difficulty with standing on airex, a lot of diffiuclty with walking ball toss if it was low.    PT Next Visit Plan  work on balance unless she is actively dizzy, treat the neck pain and spasms    Consulted and Agree with Plan of Care  Patient       Patient will benefit from skilled therapeutic intervention in order to improve the following deficits and impairments:  Dizziness, Decreased range of motion, Pain, Increased muscle spasms, Decreased balance  Visit Diagnosis: BPPV (benign paroxysmal positional vertigo), bilateral  Dizziness and giddiness  Cervicalgia     Problem List Patient Active Problem List   Diagnosis Date Noted  . Chronic anticoagulation 07/04/2015  . HOH (hard of hearing)   . Hyperlipemia   . PAF (paroxysmal atrial fibrillation) ([place]) 06/23/2015  . HTN (hypertension)     Sumner Boast., PT 02/22/2020, 5:22 PM  [name] [place], [place], [address] Phone: [phone]   Fax:  [phone]  Name: Shannon Liu MRN: [id number] Date of Birth: 12-28-1940

## 2020-02-27 DIAGNOSIS — G4733 Obstructive sleep apnea (adult) (pediatric): Secondary | ICD-10-CM | POA: Diagnosis not present

## 2020-02-28 DIAGNOSIS — M542 Cervicalgia: Secondary | ICD-10-CM | POA: Diagnosis not present

## 2020-02-28 DIAGNOSIS — I10 Essential (primary) hypertension: Secondary | ICD-10-CM | POA: Diagnosis not present

## 2020-02-28 DIAGNOSIS — G4733 Obstructive sleep apnea (adult) (pediatric): Secondary | ICD-10-CM | POA: Diagnosis not present

## 2020-02-28 DIAGNOSIS — H8112 Benign paroxysmal vertigo, left ear: Secondary | ICD-10-CM | POA: Diagnosis not present

## 2020-03-02 ENCOUNTER — Encounter: Payer: Self-pay | Admitting: Physical Therapy

## 2020-03-02 ENCOUNTER — Other Ambulatory Visit: Payer: Self-pay

## 2020-03-02 ENCOUNTER — Ambulatory Visit: Payer: Medicare PPO | Admitting: Physical Therapy

## 2020-03-02 DIAGNOSIS — G4733 Obstructive sleep apnea (adult) (pediatric): Secondary | ICD-10-CM | POA: Diagnosis not present

## 2020-03-02 DIAGNOSIS — H8113 Benign paroxysmal vertigo, bilateral: Secondary | ICD-10-CM | POA: Diagnosis not present

## 2020-03-02 DIAGNOSIS — R42 Dizziness and giddiness: Secondary | ICD-10-CM | POA: Diagnosis not present

## 2020-03-02 DIAGNOSIS — M542 Cervicalgia: Secondary | ICD-10-CM

## 2020-03-02 NOTE — Therapy (Signed)
[address] [place], [place], [address] Phone: [phone]   Fax:  [phone]  Physical Therapy Treatment  Patient Details  Name: Shannon Liu MRN: [id number] Date of Birth: January 15, 1941 Referring Provider (PT): Laurann Montana   Encounter Date: 03/02/2020  PT End of Session - 03/02/20 1619    Visit Number  3    Number of Visits  12    Date for PT Re-Evaluation  04/15/20    Authorization Type  [employer]ana    PT Start Time  [id number]    PT Stop Time  1615    PT Time Calculation (min)  45 min    Activity Tolerance  Patient tolerated treatment well    Behavior During Therapy  [facility] for tasks assessed/performed       Past Medical History:  Diagnosis Date  . Arthritis   . Heart murmur   . HOH (hard of hearing)   . HTN (hypertension)    since pregnant at age 62  . Hyperlipemia   . PAF (paroxysmal atrial fibrillation) ([place])    a. dx on 06/2015 admission. spontaneously converted into NSR. placed on Eliquis    Past Surgical History:  Procedure Laterality Date  . ABDOMINAL HYSTERECTOMY    . BREAST CYST EXCISION Left   . BREAST SURGERY Left    lumpectomy 35 yrs ago  . NECK SURGERY     discectomy with cadaver bone    There were no vitals filed for this visit.  Subjective Assessment - 03/02/20 1522    Subjective  Pt reports she is better overall in neck and dizziness but states she still feels "off" in vision. Pt reports dizziness worsening throughout the day.    Currently in Pain?  Yes    Pain Score  5     Pain Location  Neck         [facility] PT Assessment - 03/02/20 0001      Standardized Balance Assessment   Standardized Balance Assessment  Dynamic Gait Index      Dynamic Gait Index   Level Surface  Normal    Change in Gait Speed  Normal    Gait with Horizontal Head Turns  Moderate Impairment    Gait with Vertical Head Turns  Moderate Impairment    Gait and Pivot Turn  Mild Impairment    Step Over  Obstacle  Mild Impairment    Step Around Obstacles  Mild Impairment    Steps  Normal    Total Score  17    DGI comment:  most difficulty with horizontal head turns                    [facility] Adult PT Treatment/Exercise - 03/02/20 0001      High Level Balance   High Level Balance Comments  bending to pick up cones then return to upright posture x10, brandt daroff eyes closed x5, VOR vertical/horizontal on foam x1 min each direction      Exercises   Exercises  Shoulder      Neck Exercises: Machines for Strengthening   UBE (Upper Arm Bike)  L 3, 3 min fwd/3 min bkwd    Cybex Row  20# 2x10    Lat Pull  20# 2x10      Shoulder Exercises: ROM/Strengthening   Ball on Wall  1x10, 5 sec hold    Other ROM/Strengthening Exercises  lift weighted yellow ball overhead x10  PT Short Term Goals - 02/22/20 1713      PT SHORT TERM GOAL #1   Title  independent with initial HEP    Status  Achieved        PT Long Term Goals - 02/14/20 1735      PT LONG TERM GOAL #1   Title  independent with advanced HEP including vestibular exercises    Time  8    Period  Weeks    Status  New      PT LONG TERM GOAL #2   Title  decrease neck pain 25%    Time  8    Period  Weeks    Status  New      PT LONG TERM GOAL #3   Title  increase cervical ROM 25%    Time  8    Period  Weeks    Status  New            Plan - 03/02/20 1621    Clinical Impression Statement  Pt scored 17 on DGI indicated increased risk of falls; pt struggled the most with horizontal head turns while walking. Pt CGA for VOR ex's on foam. Bending and returning to upright caused increase in pt dizziness. Continue to progress next rx.    PT Treatment/Interventions  ADLs/Self Care Home Management;Visual/perceptual remediation/compensation;Vestibular;Patient/family education;Electrical Stimulation;Traction;Therapeutic exercise;Manual techniques    PT Next Visit Plan  work on balance unless she is  actively dizzy, treat the neck pain and spasms    Consulted and Agree with Plan of Care  Patient       Patient will benefit from skilled therapeutic intervention in order to improve the following deficits and impairments:  Dizziness, Decreased range of motion, Pain, Increased muscle spasms, Decreased balance  Visit Diagnosis: BPPV (benign paroxysmal positional vertigo), bilateral  Dizziness and giddiness  Cervicalgia     Problem List Patient Active Problem List   Diagnosis Date Noted  . Chronic anticoagulation 07/04/2015  . HOH (hard of hearing)   . Hyperlipemia   . PAF (paroxysmal atrial fibrillation) ([place]) 06/23/2015  . HTN (hypertension)    Amador Cunas, PT, DPT [place] [place] 03/02/2020, 4:23 PM  [address] [place], [place], [address] Phone: [phone]   Fax:  [phone]  Name: Skylee Helmke MRN: [id number] Date of Birth: 20-Jan-1941

## 2020-03-06 ENCOUNTER — Ambulatory Visit: Payer: Medicare PPO | Admitting: Physical Therapy

## 2020-03-06 ENCOUNTER — Encounter: Payer: Self-pay | Admitting: Physical Therapy

## 2020-03-06 ENCOUNTER — Other Ambulatory Visit: Payer: Self-pay

## 2020-03-06 DIAGNOSIS — M542 Cervicalgia: Secondary | ICD-10-CM

## 2020-03-06 DIAGNOSIS — R42 Dizziness and giddiness: Secondary | ICD-10-CM

## 2020-03-06 DIAGNOSIS — H8113 Benign paroxysmal vertigo, bilateral: Secondary | ICD-10-CM

## 2020-03-06 NOTE — Telephone Encounter (Signed)
Yes OK to cut back to once a day DILT CD 120mg   Candee Furbish, MD

## 2020-03-06 NOTE — Therapy (Signed)
[address] [place], [place], [address] Phone: [phone]   Fax:  [phone]  Physical Therapy Treatment  Patient Details  Name: Shannon Liu MRN: [id number] Date of Birth: 1941-07-04 Referring Provider (PT): Laurann Montana   Encounter Date: 03/06/2020  PT End of Session - 03/06/20 0928    Visit Number  4    Number of Visits  12    Date for PT Re-Evaluation  04/15/20    Authorization Type  [employer]    PT Start Time  [phone]    PT Stop Time  0940    PT Time Calculation (min)  57 min    Activity Tolerance  Patient tolerated treatment well    Behavior During Therapy  [facility] for tasks assessed/performed       Past Medical History:  Diagnosis Date  . Arthritis   . Heart murmur   . HOH (hard of hearing)   . HTN (hypertension)    since pregnant at age 49  . Hyperlipemia   . PAF (paroxysmal atrial fibrillation) ([place])    a. dx on 06/2015 admission. spontaneously converted into NSR. placed on Eliquis    Past Surgical History:  Procedure Laterality Date  . ABDOMINAL HYSTERECTOMY    . BREAST CYST EXCISION Left   . BREAST SURGERY Left    lumpectomy 35 yrs ago  . NECK SURGERY     discectomy with cadaver bone    There were no vitals filed for this visit.  Subjective Assessment - 03/06/20 0844    Subjective  Patient reports that over the weekend she started having neck and left shoulder (upper trap) tightness.  She also reports dizziness with activites    Currently in Pain?  Yes    Pain Score  5     Pain Location  Neck    Pain Orientation  Left    Pain Descriptors / Indicators  Aching;Tightness;Spasm    Pain Relieving Factors  the treatment seems to help some                        [facility] Adult PT Treatment/Exercise - 03/06/20 0001      High Level Balance   High Level Balance Activities  Direction changes;Negotitating around obstacles;Negotiating over obstacles    High Level Balance  Comments  walking ball toss, walking head turns, soccer kicks, walking down stairs and then outside up slope      Neck Exercises: Machines for Strengthening   UBE (Upper Arm Bike)  L 3, 3 min fwd/3 min bkwd      Neck Exercises: Theraband   Shoulder Extension  20 reps;Red    Rows  20 reps;Red    Shoulder External Rotation  20 reps;Red      Modalities   Modalities  Electrical Stimulation;Moist Heat      Moist Heat Therapy   Number Minutes Moist Heat  10 Minutes    Moist Heat Location  Cervical      Electrical Stimulation   Electrical Stimulation Location  cervical    Electrical Stimulation Action  IFC    Electrical Stimulation Parameters  sitting    Electrical Stimulation Goals  Pain      Manual Therapy   Manual Therapy  Soft tissue mobilization    Soft tissue mobilization  focus on the left upper trap, cervical and rhomboid area  PT Short Term Goals - 02/22/20 1713      PT SHORT TERM GOAL #1   Title  independent with initial HEP    Status  Achieved        PT Long Term Goals - 03/06/20 0931      PT LONG TERM GOAL #1   Title  independent with advanced HEP including vestibular exercises    Status  On-going      PT LONG TERM GOAL #2   Title  decrease neck pain 25%    Status  On-going            Plan - 03/06/20 0929    Clinical Impression Statement  Patient is reporting that she is doing better, still feels "off" with walking.  Still sore and very tight in the left upper trap    PT Next Visit Plan  work on balance unless she is actively dizzy, treat the neck pain and spasms    Consulted and Agree with Plan of Care  Patient       Patient will benefit from skilled therapeutic intervention in order to improve the following deficits and impairments:  Dizziness, Decreased range of motion, Pain, Increased muscle spasms, Decreased balance  Visit Diagnosis: BPPV (benign paroxysmal positional vertigo), bilateral  Dizziness and  giddiness  Cervicalgia     Problem List Patient Active Problem List   Diagnosis Date Noted  . Chronic anticoagulation 07/04/2015  . HOH (hard of hearing)   . Hyperlipemia   . PAF (paroxysmal atrial fibrillation) ([place]) 06/23/2015  . HTN (hypertension)     Sumner Boast .,PT  03/06/2020, 9:31 AM  [address] [place], [place], [address] Phone: [phone]   Fax:  [phone]  Name: Shannon Liu MRN: [id number] Date of Birth: 08/30/1941

## 2020-03-08 ENCOUNTER — Encounter: Payer: Medicare PPO | Admitting: Physical Therapy

## 2020-03-09 ENCOUNTER — Ambulatory Visit: Payer: Medicare PPO | Admitting: Physical Therapy

## 2020-03-09 ENCOUNTER — Other Ambulatory Visit: Payer: Self-pay

## 2020-03-09 DIAGNOSIS — R42 Dizziness and giddiness: Secondary | ICD-10-CM

## 2020-03-09 DIAGNOSIS — M542 Cervicalgia: Secondary | ICD-10-CM | POA: Diagnosis not present

## 2020-03-09 DIAGNOSIS — H8113 Benign paroxysmal vertigo, bilateral: Secondary | ICD-10-CM

## 2020-03-09 NOTE — Therapy (Signed)
[address] [place], [place], [address] Phone: [phone]   Fax:  [phone]  Physical Therapy Treatment  Patient Details  Name: Shannon Liu MRN: [id number] Date of Birth: 31-Mar-1941 Referring Provider (PT): Laurann Montana   Encounter Date: 03/09/2020  PT End of Session - 03/09/20 1524    Visit Number  5    Number of Visits  12    Date for PT Re-Evaluation  04/15/20    PT Start Time  5465    PT Stop Time  1525    PT Time Calculation (min)  40 min       Past Medical History:  Diagnosis Date  . Arthritis   . Heart murmur   . HOH (hard of hearing)   . HTN (hypertension)    since pregnant at age 79  . Hyperlipemia   . PAF (paroxysmal atrial fibrillation) ([place])    a. dx on 06/2015 admission. spontaneously converted into NSR. placed on Eliquis    Past Surgical History:  Procedure Laterality Date  . ABDOMINAL HYSTERECTOMY    . BREAST CYST EXCISION Left   . BREAST SURGERY Left    lumpectomy 35 yrs ago  . NECK SURGERY     discectomy with cadaver bone    There were no vitals filed for this visit.  Subjective Assessment - 03/09/20 1455    Subjective  not dizzy just wobbly. neck better just a spot on back of shld blade    Currently in Pain?  Yes    Pain Score  4     Pain Location  Shoulder    Pain Orientation  Left;Posterior                        [facility] Adult PT Treatment/Exercise - 03/09/20 0001      High Level Balance   High Level Balance Activities  Direction changes;Turns;Sudden stops;Head turns;Negotitating around obstacles;Negotiating over obstacles   ball toss and kicking   High Level Balance Comments  rapid eye mvmt stationary and dynamic- dynamic in vertical plane( instructed to do at home with TV)      Neck Exercises: Machines for Strengthening   Cybex Row  20# 2x10    Lat Pull  20# 2x10      Neck Exercises: Standing   Wall Push Ups  10 reps   5 CW and 5 CCW  with ball     Shoulder Exercises: ROM/Strengthening   Nustep  L 5 5 min      Manual Therapy   Manual Therapy  Soft tissue mobilization;Taping    Soft tissue mobilization  left rhom TP    Kinesiotex  Create Space             PT Education - 03/09/20 1523    Education Details  VOR ex with TV on - vertical caused dizziness    Person(s) Educated  Patient    Methods  Explanation;Demonstration    Comprehension  Verbalized understanding;Returned demonstration       PT Short Term Goals - 02/22/20 1713      PT SHORT TERM GOAL #1   Title  independent with initial HEP    Status  Achieved        PT Long Term Goals - 03/09/20 1528      PT LONG TERM GOAL #1   Title  independent with advanced HEP including vestibular exercises    Status  Partially  Met      PT LONG TERM GOAL #2   Title  decrease neck pain 25%    Status  Achieved      PT LONG TERM GOAL #3   Title  increase cervical ROM 25%    Status  On-going            Plan - 03/09/20 1524    Clinical Impression Statement  pt denies dizziness but states wobbly an ddoes rely on walls to stabilize. VOR with dynamic mvt esp in vertical plane causes dizziness. added moe blance ex with turns and ball toss- consistant LOB in sagital plane. large TP in Left rhob =-responded well to STW and KT tape.    PT Treatment/Interventions  ADLs/Self Care Home Management;Visual/perceptual remediation/compensation;Vestibular;Patient/family education;Electrical Stimulation;Traction;Therapeutic exercise;Manual techniques    PT Next Visit Plan  chcek VOR with TV and progress, check TP in Left rhom       Patient will benefit from skilled therapeutic intervention in order to improve the following deficits and impairments:  Dizziness, Decreased range of motion, Pain, Increased muscle spasms, Decreased balance  Visit Diagnosis: Dizziness and giddiness  Cervicalgia  BPPV (benign paroxysmal positional vertigo), bilateral     Problem  List Patient Active Problem List   Diagnosis Date Noted  . Chronic anticoagulation 07/04/2015  . HOH (hard of hearing)   . Hyperlipemia   . PAF (paroxysmal atrial fibrillation) ([place]) 06/23/2015  . HTN (hypertension)     PAYSEUR,ANGIE PTA 03/09/2020, 3:29 PM  [address] [place], [place], [address] Phone: [phone]   Fax:  [phone]  Name: Keili Hasten MRN: [id number] Date of Birth: [birth year]

## 2020-03-15 ENCOUNTER — Other Ambulatory Visit: Payer: Self-pay

## 2020-03-15 ENCOUNTER — Encounter: Payer: Self-pay | Admitting: Physical Therapy

## 2020-03-15 ENCOUNTER — Ambulatory Visit: Payer: Medicare PPO | Attending: Internal Medicine | Admitting: Physical Therapy

## 2020-03-15 DIAGNOSIS — R42 Dizziness and giddiness: Secondary | ICD-10-CM | POA: Insufficient documentation

## 2020-03-15 DIAGNOSIS — H8113 Benign paroxysmal vertigo, bilateral: Secondary | ICD-10-CM

## 2020-03-15 DIAGNOSIS — M542 Cervicalgia: Secondary | ICD-10-CM

## 2020-03-15 NOTE — Therapy (Signed)
[facility] [address] [place], [place], [address] Phone: [phone]   Fax:  [phone]  Physical Therapy Treatment  Patient Details  Name: Shannon Liu MRN: [id number] Date of Birth: 1941/10/03 Referring Provider (PT): Laurann Montana   Encounter Date: 03/15/2020  PT End of Session - 03/15/20 1801    Visit Number  6    Number of Visits  12    Date for PT Re-Evaluation  04/15/20    Authorization Type  [employer]    PT Start Time  1311    PT Stop Time  1400    PT Time Calculation (min)  49 min    Activity Tolerance  Patient tolerated treatment well    Behavior During Therapy  [facility] for tasks assessed/performed       Past Medical History:  Diagnosis Date  . Arthritis   . Heart murmur   . HOH (hard of hearing)   . HTN (hypertension)    since pregnant at age 72  . Hyperlipemia   . PAF (paroxysmal atrial fibrillation) ([place])    a. dx on 06/2015 admission. spontaneously converted into NSR. placed on Eliquis    Past Surgical History:  Procedure Laterality Date  . ABDOMINAL HYSTERECTOMY    . BREAST CYST EXCISION Left   . BREAST SURGERY Left    lumpectomy 35 yrs ago  . NECK SURGERY     discectomy with cadaver bone    There were no vitals filed for this visit.  Subjective Assessment - 03/15/20 1311    Subjective  Patient reports that she is not feeling well                        [facility] Adult PT Treatment/Exercise - 03/15/20 0001      Moist Heat Therapy   Number Minutes Moist Heat  10 Minutes    Moist Heat Location  Cervical      Electrical Stimulation   Electrical Stimulation Location  cervical    Electrical Stimulation Action  IFC    Electrical Stimulation Parameters  sitting    Electrical Stimulation Goals  Pain      Manual Therapy   Manual Therapy  Soft tissue mobilization    Manual therapy comments  passive ROM of the cervical spine with gentle stretches    Soft tissue mobilization   left upper trap, rhomboid and neck      Neck Exercises: Stretches   Upper Trapezius Stretch  Right;Left;2 reps;10 seconds    Levator Stretch  Right;Left;2 reps;10 seconds    Corner Stretch  3 reps;10 seconds       Trigger Point Dry Needling - 03/15/20 0001    Consent Given?  Yes    Education Handout Provided  Yes    Muscles Treated Head and Neck  Upper trapezius    Upper Trapezius Response  Twitch reponse elicited;Palpable increased muscle length             PT Short Term Goals - 02/22/20 1713      PT SHORT TERM GOAL #1   Title  independent with initial HEP    Status  Achieved        PT Long Term Goals - 03/09/20 1528      PT LONG TERM GOAL #1   Title  independent with advanced HEP including vestibular exercises    Status  Partially Met      PT LONG TERM GOAL #2  Title  decrease neck pain 25%    Status  Achieved      PT LONG TERM GOAL #3   Title  increase cervical ROM 25%    Status  On-going            Plan - 03/15/20 1802    Clinical Impression Statement  Patient continues to report "wobbly " feeling, reports not dizzy, reports that she does the eye exercises and the epley at home, she reports that she feels it is all coming from her neck, I focused treatment on the neck today and really worked on the mms spasms and the trigger points in the upper trap, the cervical paraspinals and the rhomboid    PT Next Visit Plan  chcek VOR with TV and progress, check TP in Left rhom    Consulted and Agree with Plan of Care  Patient       Patient will benefit from skilled therapeutic intervention in order to improve the following deficits and impairments:  Dizziness, Decreased range of motion, Pain, Increased muscle spasms, Decreased balance  Visit Diagnosis: Dizziness and giddiness  Cervicalgia  BPPV (benign paroxysmal positional vertigo), bilateral     Problem List Patient Active Problem List   Diagnosis Date Noted  . Chronic anticoagulation 07/04/2015   . HOH (hard of hearing)   . Hyperlipemia   . PAF (paroxysmal atrial fibrillation) ([place]) 06/23/2015  . HTN (hypertension)     Sumner Boast., PT 03/15/2020, 6:04 PM  [address] [place], [place], [address] Phone: [phone]   Fax:  [phone]  Name: Shannon Liu MRN: [id number] Date of Birth: May 21, 1941

## 2020-03-20 ENCOUNTER — Ambulatory Visit (INDEPENDENT_AMBULATORY_CARE_PROVIDER_SITE_OTHER): Payer: Medicare PPO

## 2020-03-20 ENCOUNTER — Other Ambulatory Visit: Payer: Self-pay

## 2020-03-20 ENCOUNTER — Ambulatory Visit: Payer: Medicare PPO | Admitting: Family Medicine

## 2020-03-20 ENCOUNTER — Encounter: Payer: Self-pay | Admitting: Family Medicine

## 2020-03-20 VITALS — BP 130/80 | HR 85 | Ht 67.0 in | Wt 209.0 lb

## 2020-03-20 DIAGNOSIS — M542 Cervicalgia: Secondary | ICD-10-CM

## 2020-03-20 DIAGNOSIS — M5030 Other cervical disc degeneration, unspecified cervical region: Secondary | ICD-10-CM | POA: Diagnosis not present

## 2020-03-20 DIAGNOSIS — M1990 Unspecified osteoarthritis, unspecified site: Secondary | ICD-10-CM | POA: Insufficient documentation

## 2020-03-20 MED ORDER — GABAPENTIN 100 MG PO CAPS
200.0000 mg | ORAL_CAPSULE | Freq: Every day | ORAL | 3 refills | Status: DC
Start: 2020-03-20 — End: 2021-03-02

## 2020-03-20 NOTE — Patient Instructions (Signed)
Good to see you.  Ice 20 minutes 2 times daily. Usually after activity and before bed. Exercises 3 times a week.  Keep hands within peripheral vision pennsaid pinkie amount topically 2 times daily as needed.  Gabapentin 100mg - 200 mg at night  Vitamin D 2000 IU daily over the counter  Tell mike to keep doing what he does well   See me again in 4-6 weeks

## 2020-03-20 NOTE — Assessment & Plan Note (Signed)
Patient does have tightness that is consistent with more of a degenerative disc disease of the cervical spine.  We discussed with patient about icing regimen, home exercises, we discussed gabapentin at low doses.  Avoid anti-inflammatory secondary to patient being on chronic anticoagulation.  Encouraged her to continue to work with the physical therapy where patient has responded well.  We will hold on anything such as osteopathic manipulation but could be a potential at follow-up.  Follow-up again in 4 to 6 weeks for further evaluation.
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
## 2020-03-21 ENCOUNTER — Other Ambulatory Visit: Payer: Self-pay

## 2020-03-21 ENCOUNTER — Ambulatory Visit: Payer: Medicare PPO | Admitting: Physical Therapy

## 2020-03-21 ENCOUNTER — Encounter: Payer: Self-pay | Admitting: Physical Therapy

## 2020-03-21 DIAGNOSIS — R42 Dizziness and giddiness: Secondary | ICD-10-CM | POA: Diagnosis not present

## 2020-03-21 DIAGNOSIS — M542 Cervicalgia: Secondary | ICD-10-CM | POA: Diagnosis not present

## 2020-03-21 DIAGNOSIS — H8113 Benign paroxysmal vertigo, bilateral: Secondary | ICD-10-CM

## 2020-03-21 NOTE — Therapy (Signed)
[facility] [address] [place], [place], [address] Phone: [phone]   Fax:  [phone]  Physical Therapy Treatment  Patient Details  Name: Shannon Liu MRN: [id number] Date of Birth: 10/30/1940 Referring Provider (PT): Laurann Montana   Encounter Date: 03/21/2020  PT End of Session - 03/21/20 1056    Visit Number  7    Number of Visits  12    Date for PT Re-Evaluation  04/15/20    Authorization Type  [employer]    PT Start Time  1012    PT Stop Time  1102    PT Time Calculation (min)  50 min    Activity Tolerance  Patient tolerated treatment well    Behavior During Therapy  [facility] for tasks assessed/performed       Past Medical History:  Diagnosis Date   Arthritis    Heart murmur    HOH (hard of hearing)    HTN (hypertension)    since pregnant at age 41   Hyperlipemia    PAF (paroxysmal atrial fibrillation) ([place])    a. dx on 06/2015 admission. spontaneously converted into NSR. placed on Eliquis    Past Surgical History:  Procedure Laterality Date   ABDOMINAL HYSTERECTOMY     BREAST CYST EXCISION Left    BREAST SURGERY Left    lumpectomy 35 yrs ago   NECK SURGERY     discectomy with cadaver bone    There were no vitals filed for this visit.  Subjective Assessment - 03/21/20 1052    Subjective  Patient reports that she is really doing better, less pain and less dizziness, she drove today.  Saw MD yesterday, x-ray showed DDD and arthritis    Currently in Pain?  Yes    Pain Score  3     Pain Location  Shoulder    Pain Orientation  Left    Pain Descriptors / Indicators  Aching;Sore;Spasm    Pain Relieving Factors  the treatment really has helped                        [facility] Adult PT Treatment/Exercise - 03/21/20 0001      Moist Heat Therapy   Number Minutes Moist Heat  10 Minutes    Moist Heat Location  Cervical      Electrical Stimulation   Electrical Stimulation Location   cervical    Electrical Stimulation Action  IFC    Electrical Stimulation Parameters  sitting    Electrical Stimulation Goals  Pain      Manual Therapy   Manual Therapy  Soft tissue mobilization    Manual therapy comments  passive ROM of the cervical spine with gentle stretches    Soft tissue mobilization  left upper trap, rhomboid and neck, gentle occipital release       Trigger Point Dry Needling - 03/21/20 0001    Consent Given?  Yes    Muscles Treated Head and Neck  Oblique capitus;Upper trapezius    Upper Trapezius Response  Twitch reponse elicited;Palpable increased muscle length    Oblique Capitus Response  Palpable increased muscle length             PT Short Term Goals - 02/22/20 1713      PT SHORT TERM GOAL #1   Title  independent with initial HEP    Status  Achieved        PT Long Term Goals -  03/09/20 1528      PT LONG TERM GOAL #1   Title  independent with advanced HEP including vestibular exercises    Status  Partially Met      PT LONG TERM GOAL #2   Title  decrease neck pain 25%    Status  Achieved      PT LONG TERM GOAL #3   Title  increase cervical ROM 25%    Status  On-going            Plan - 03/21/20 1108    Clinical Impression Statement  Patient reports that [place] focusing on the mm spasms in the neck have helped the dizziness and the neck.  She aswe the MD yesterday and x-rays were done, has DDD and some arthritis.  I added some gentle occipital release and stretches, with manual shoulder depression and upper trap stretches this caused some of her left arm pain.    PT Next Visit Plan  changed to see her only 1x next week and then will skip a week and then have her come back and see how she is doing    Consulted and Agree with Plan of Care  Patient       Patient will benefit from skilled therapeutic intervention in order to improve the following deficits and impairments:  Dizziness, Decreased range of motion, Pain, Increased muscle  spasms, Decreased balance  Visit Diagnosis: Dizziness and giddiness  Cervicalgia  BPPV (benign paroxysmal positional vertigo), bilateral     Problem List Patient Active Problem List   Diagnosis Date Noted   Degenerative disc disease, cervical 03/20/2020   Chronic anticoagulation 07/04/2015   HOH (hard of hearing)    Hyperlipemia    PAF (paroxysmal atrial fibrillation) ([place]) 06/23/2015   HTN (hypertension)     Sumner Boast., PT 03/21/2020, 11:10 AM  [address] [place], [place], [address] Phone: [phone]   Fax:  [phone]  Name: Shannon Liu MRN: [id number] Date of Birth: 1940-12-13

## 2020-03-23 ENCOUNTER — Encounter: Payer: Self-pay | Admitting: Physical Therapy

## 2020-03-23 ENCOUNTER — Ambulatory Visit: Payer: Medicare PPO | Admitting: Physical Therapy

## 2020-03-23 ENCOUNTER — Other Ambulatory Visit: Payer: Self-pay

## 2020-03-23 DIAGNOSIS — R42 Dizziness and giddiness: Secondary | ICD-10-CM | POA: Diagnosis not present

## 2020-03-23 DIAGNOSIS — M542 Cervicalgia: Secondary | ICD-10-CM | POA: Diagnosis not present

## 2020-03-23 DIAGNOSIS — H8113 Benign paroxysmal vertigo, bilateral: Secondary | ICD-10-CM | POA: Diagnosis not present

## 2020-03-23 NOTE — Therapy (Signed)
[place], [place], [address] Phone: [phone]   Fax:  [phone]  Physical Therapy Treatment  Patient Details  Name: Shannon Liu MRN: [id number] Date of Birth: 08/23/1941 Referring Provider (PT): Laurann Montana   Encounter Date: 03/23/2020   PT End of Session - 03/23/20 2707    Visit Number 8    Number of Visits 12    Date for PT Re-Evaluation 04/15/20    Authorization Type [employer]    PT Start Time 1053    PT Stop Time 1152    PT Time Calculation (min) 59 min    Activity Tolerance Patient tolerated treatment well    Behavior During Therapy [facility] for tasks assessed/performed           Past Medical History:  Diagnosis Date  . Arthritis   . Heart murmur   . HOH (hard of hearing)   . HTN (hypertension)    since pregnant at age 37  . Hyperlipemia   . PAF (paroxysmal atrial fibrillation) ([place])    a. dx on 06/2015 admission. spontaneously converted into NSR. placed on Eliquis    Past Surgical History:  Procedure Laterality Date  . ABDOMINAL HYSTERECTOMY    . BREAST CYST EXCISION Left   . BREAST SURGERY Left    lumpectomy 35 yrs ago  . NECK SURGERY     discectomy with cadaver bone    There were no vitals filed for this visit.   Subjective Assessment - 03/23/20 1100    Subjective Patient reports that she drove again today, walked a mile yesterday without much dizziness,    Currently in Pain? Yes    Pain Score 3     Pain Location Shoulder    Pain Orientation Left    Pain Descriptors / Indicators Sore;Spasm    Pain Relieving Factors "the dry needles help"                             [facility] Adult PT Treatment/Exercise - 03/23/20 0001      Neck Exercises: Machines for Strengthening   UBE (Upper Arm Bike) L 3, 3 min fwd/3 min bkwd    Cybex Row 20# 2x10    Lat Pull 20# 2x10      Neck Exercises: Theraband   Shoulder External Rotation 20 reps;Red      Neck  Exercises: Standing   Other Standing Exercises W backs, light doorway stretch      Neck Exercises: Supine   Neck Retraction 20 reps    Neck Retraction Limitations head on ball      Moist Heat Therapy   Number Minutes Moist Heat 10 Minutes    Moist Heat Location Cervical      Electrical Stimulation   Electrical Stimulation Location cervical    Electrical Stimulation Action IFC    Electrical Stimulation Parameters sitting    Electrical Stimulation Goals Pain      Manual Therapy   Manual Therapy Soft tissue mobilization    Manual therapy comments passive ROM of the cervical spine with gentle stretches    Soft tissue mobilization left upper trap, rhomboid and neck, gentle occipital release                    PT Short Term Goals - 02/22/20 1713      PT SHORT TERM GOAL #1   Title independent with initial HEP  Status Achieved             PT Long Term Goals - 03/23/20 1135      PT LONG TERM GOAL #1   Title independent with advanced HEP including vestibular exercises    Status Achieved                 Plan - 03/23/20 1133    Clinical Impression Statement Patient is reporting less pain mostly up in the neck area less inthe scapula and the upper trap.  She is reporting less dizziness and was able to walk yesterday, I added some exercises today and we are decreasing the frequency next week    PT Next Visit Plan changed to see her only 1x next week and then will skip a week and then have her come back and see how she is doing    Consulted and Agree with Plan of Care Patient           Patient will benefit from skilled therapeutic intervention in order to improve the following deficits and impairments:  Dizziness, Decreased range of motion, Pain, Increased muscle spasms, Decreased balance  Visit Diagnosis: Dizziness and giddiness  Cervicalgia  BPPV (benign paroxysmal positional vertigo), bilateral     Problem List Patient Active Problem List    Diagnosis Date Noted  . Degenerative disc disease, cervical 03/20/2020  . Chronic anticoagulation 07/04/2015  . HOH (hard of hearing)   . Hyperlipemia   . PAF (paroxysmal atrial fibrillation) ([place]) 06/23/2015  . HTN (hypertension)     Sumner Boast., PT 03/23/2020, 11:35 AM  [place], [place], [address] Phone: [phone]   Fax:  [phone]  Name: Shannon Liu MRN: [id number] Date of Birth: [birth year]

## 2020-03-27 ENCOUNTER — Encounter: Payer: Medicare PPO | Admitting: Physical Therapy

## 2020-03-29 ENCOUNTER — Encounter: Payer: Self-pay | Admitting: Physical Therapy

## 2020-03-29 ENCOUNTER — Other Ambulatory Visit: Payer: Self-pay

## 2020-03-29 ENCOUNTER — Ambulatory Visit: Payer: Medicare PPO | Admitting: Physical Therapy

## 2020-03-29 DIAGNOSIS — H8113 Benign paroxysmal vertigo, bilateral: Secondary | ICD-10-CM

## 2020-03-29 DIAGNOSIS — R42 Dizziness and giddiness: Secondary | ICD-10-CM

## 2020-03-29 DIAGNOSIS — G4733 Obstructive sleep apnea (adult) (pediatric): Secondary | ICD-10-CM | POA: Diagnosis not present

## 2020-03-29 DIAGNOSIS — M542 Cervicalgia: Secondary | ICD-10-CM

## 2020-03-29 NOTE — Therapy (Signed)
[place], [place], [address] Phone: [phone]   Fax:  [phone]  Physical Therapy Treatment  Patient Details  Name: Shannon Liu MRN: [id number] Date of Birth: 11-Feb-1941 Referring Provider (PT): Laurann Montana   Encounter Date: 03/29/2020   PT End of Session - 03/29/20 1434    Visit Number 9    Number of Visits 12    Date for PT Re-Evaluation 04/15/20    Authorization Type [employer]    PT Start Time 1351    PT Stop Time 1446    PT Time Calculation (min) 55 min    Activity Tolerance Patient tolerated treatment well    Behavior During Therapy [facility] for tasks assessed/performed           Past Medical History:  Diagnosis Date  . Arthritis   . Heart murmur   . HOH (hard of hearing)   . HTN (hypertension)    since pregnant at age 79  . Hyperlipemia   . PAF (paroxysmal atrial fibrillation) ([place])    a. dx on 06/2015 admission. spontaneously converted into NSR. placed on Eliquis    Past Surgical History:  Procedure Laterality Date  . ABDOMINAL HYSTERECTOMY    . BREAST CYST EXCISION Left   . BREAST SURGERY Left    lumpectomy 35 yrs ago  . NECK SURGERY     discectomy with cadaver bone    There were no vitals filed for this visit.   Subjective Assessment - 03/29/20 1354    Subjective Patient report sthat she still feels that she is getting better, less "wobbly feeling", reports less pain, still some numbness in the left arm at times    Currently in Pain? Yes    Pain Score 2     Pain Location Neck    Pain Orientation Left    Pain Descriptors / Indicators Spasm;Tightness    Pain Relieving Factors the treatment is really helping                             [facility] Adult PT Treatment/Exercise - 03/29/20 0001      Neck Exercises: Machines for Strengthening   Nustep level 4 x 6 minutes    Cybex Row 20# 2x10    Lat Pull 20# 2x10      Neck Exercises: Theraband    Shoulder External Rotation 20 reps;Red      Neck Exercises: Standing   Other Standing Exercises W backs, light doorway stretch      Moist Heat Therapy   Number Minutes Moist Heat 10 Minutes    Moist Heat Location Cervical      Electrical Stimulation   Electrical Stimulation Location cervical    Electrical Stimulation Action IFC    Electrical Stimulation Parameters sitting    Electrical Stimulation Goals Pain      Manual Therapy   Manual Therapy Soft tissue mobilization    Manual therapy comments passive ROM of the cervical spine with gentle stretches, occipital release    Soft tissue mobilization left upper trap, rhomboid and neck, gentle occipital release            Trigger Point Dry Needling - 03/29/20 0001    Consent Given? Yes    Muscles Treated Head and Neck Upper trapezius    Upper Trapezius Response Twitch reponse elicited;Palpable increased muscle length  PT Short Term Goals - 02/22/20 1713      PT SHORT TERM GOAL #1   Title independent with initial HEP    Status Achieved             PT Long Term Goals - 03/29/20 1436      PT LONG TERM GOAL #3   Title increase cervical ROM 25%    Status Partially Met                 Plan - 03/29/20 1434    Clinical Impression Statement Less dizziness, less pain overall, still some c/o numbness in the left arm, still with some significant trap spasms in the left.  Reports that she is feeling like this is really helping.    PT Next Visit Plan keep doing what we are doing, will be on vacation soon    Consulted and Agree with Plan of Care Patient           Patient will benefit from skilled therapeutic intervention in order to improve the following deficits and impairments:  Dizziness, Decreased range of motion, Pain, Increased muscle spasms, Decreased balance  Visit Diagnosis: Dizziness and giddiness  Cervicalgia  BPPV (benign paroxysmal positional vertigo),  bilateral     Problem List Patient Active Problem List   Diagnosis Date Noted  . Degenerative disc disease, cervical 03/20/2020  . Chronic anticoagulation 07/04/2015  . HOH (hard of hearing)   . Hyperlipemia   . PAF (paroxysmal atrial fibrillation) ([place]) 06/23/2015  . HTN (hypertension)     Sumner Boast., PT 03/29/2020, 2:37 PM  [place] [address] [place], [place], [address] Phone: [phone]   Fax:  [phone]  Name: Shannon Liu MRN: [id number] Date of Birth: [birth year]

## 2020-04-03 ENCOUNTER — Encounter: Payer: Medicare PPO | Admitting: Physical Therapy

## 2020-04-05 ENCOUNTER — Encounter: Payer: Medicare PPO | Admitting: Physical Therapy

## 2020-04-07 ENCOUNTER — Other Ambulatory Visit: Payer: Self-pay

## 2020-04-07 ENCOUNTER — Encounter: Payer: Self-pay | Admitting: Physical Therapy

## 2020-04-07 ENCOUNTER — Ambulatory Visit: Payer: Medicare PPO | Admitting: Physical Therapy

## 2020-04-07 DIAGNOSIS — H8113 Benign paroxysmal vertigo, bilateral: Secondary | ICD-10-CM

## 2020-04-07 DIAGNOSIS — R42 Dizziness and giddiness: Secondary | ICD-10-CM | POA: Diagnosis not present

## 2020-04-07 DIAGNOSIS — M542 Cervicalgia: Secondary | ICD-10-CM

## 2020-04-07 NOTE — Therapy (Signed)
[place] [address] [place], [place], [address] Phone: [phone]   Fax:  [phone] Progress Note Reporting Period 02/14/20 to 04/07/20 for the first 10 visits  See note below for Objective Data and Assessment of Progress/Goals.      Physical Therapy Treatment  Patient Details  Name: Laquiesha Piacente MRN: [id number] Date of Birth: [birth year] Referring Provider (PT): Laurann Montana   Encounter Date: 04/07/2020   PT End of Session - 04/07/20 0933    Visit Number 10    Date for PT Re-Evaluation 04/15/20    Authorization Type [employer]    PT Start Time 0845    PT Stop Time 0940    PT Time Calculation (min) 55 min    Activity Tolerance Patient tolerated treatment well    Behavior During Therapy [facility] for tasks assessed/performed           Past Medical History:  Diagnosis Date  . Arthritis   . Heart murmur   . HOH (hard of hearing)   . HTN (hypertension)    since pregnant at age 71  . Hyperlipemia   . PAF (paroxysmal atrial fibrillation) ([place])    a. dx on 06/2015 admission. spontaneously converted into NSR. placed on Eliquis    Past Surgical History:  Procedure Laterality Date  . ABDOMINAL HYSTERECTOMY    . BREAST CYST EXCISION Left   . BREAST SURGERY Left    lumpectomy 35 yrs ago  . NECK SURGERY     discectomy with cadaver bone    There were no vitals filed for this visit.   Subjective Assessment - 04/07/20 0852    Subjective Reports that she was on vacation, did "pretty good" reports much less vertigo, still issues with neck stiffness and spasms    Currently in Pain? Yes    Pain Score 2     Pain Location Neck    Pain Orientation Left    Pain Descriptors / Indicators Spasm;Tightness    Aggravating Factors  turning head                             [facility] Adult PT Treatment/Exercise - 04/07/20 0001      Neck Exercises: Machines for Strengthening   UBE (Upper Arm Bike) L 3, 3 min  fwd/3 min bkwd    Nustep level 4 x 6 minutes    Cybex Row 20# 2x10    Lat Pull 20# 2x10      Neck Exercises: Theraband   Shoulder Extension 20 reps;Red    Rows 20 reps;Red    Shoulder External Rotation 20 reps;Red      Moist Heat Therapy   Number Minutes Moist Heat 10 Minutes    Moist Heat Location Cervical      Electrical Stimulation   Electrical Stimulation Location cervical    Electrical Stimulation Action IFC    Electrical Stimulation Parameters sitting    Electrical Stimulation Goals Pain      Manual Therapy   Manual Therapy Soft tissue mobilization    Manual therapy comments passive ROM of the cervical spine with gentle stretches, occipital release    Soft tissue mobilization left upper trap, rhomboid and neck, gentle occipital release                    PT Short Term Goals - 02/22/20 1713      PT SHORT TERM GOAL #1  Title independent with initial HEP    Status Achieved             PT Long Term Goals - 04/07/20 0936      PT LONG TERM GOAL #1   Title independent with advanced HEP including vestibular exercises    Status Achieved      PT LONG TERM GOAL #2   Title decrease neck pain 25%    Status Achieved      PT LONG TERM GOAL #3   Title increase cervical ROM 25%    Status Partially Met                 Plan - 04/07/20 0934    Clinical Impression Statement Patient reports much less vertigo, she still has stiffness and spasms in the upper trap and the left neck area, she has some trigger points here as well.    PT Next Visit Plan will work on getting her to normal, may need to ask for more visits    Consulted and Agree with Plan of Care Patient           Patient will benefit from skilled therapeutic intervention in order to improve the following deficits and impairments:  Dizziness, Decreased range of motion, Pain, Increased muscle spasms, Decreased balance  Visit Diagnosis: Dizziness and giddiness  Cervicalgia  BPPV (benign  paroxysmal positional vertigo), bilateral     Problem List Patient Active Problem List   Diagnosis Date Noted  . Degenerative disc disease, cervical 03/20/2020  . Chronic anticoagulation 07/04/2015  . HOH (hard of hearing)   . Hyperlipemia   . PAF (paroxysmal atrial fibrillation) ([place]) 06/23/2015  . HTN (hypertension)     Sumner Boast., PT 04/07/2020, 9:36 AM  [place], [place], [address] Phone: [phone]   Fax:  [phone]  Name: Rella Egelston MRN: [id number] Date of Birth: November 16, 1940

## 2020-04-10 ENCOUNTER — Encounter: Payer: Self-pay | Admitting: Physical Therapy

## 2020-04-11 ENCOUNTER — Encounter: Payer: Self-pay | Admitting: Physical Therapy

## 2020-04-12 ENCOUNTER — Ambulatory Visit: Payer: Medicare PPO | Admitting: Physical Therapy

## 2020-04-12 ENCOUNTER — Encounter: Payer: Self-pay | Admitting: Physical Therapy

## 2020-04-12 ENCOUNTER — Other Ambulatory Visit: Payer: Self-pay

## 2020-04-12 DIAGNOSIS — H8113 Benign paroxysmal vertigo, bilateral: Secondary | ICD-10-CM | POA: Diagnosis not present

## 2020-04-12 DIAGNOSIS — M542 Cervicalgia: Secondary | ICD-10-CM

## 2020-04-12 DIAGNOSIS — R42 Dizziness and giddiness: Secondary | ICD-10-CM | POA: Diagnosis not present

## 2020-04-12 NOTE — Therapy (Signed)
[place], [place], [address] Phone: [phone]   Fax:  [phone]  Physical Therapy Treatment  Patient Details  Name: Shannon Liu MRN: [id number] Date of Birth: 16-Jul-1941 Referring Provider (PT): Laurann Montana   Encounter Date: 04/12/2020   PT End of Session - 04/12/20 9924    Visit Number 11    Date for PT Re-Evaluation 04/15/20    Authorization Type Hum[employer]    PT Start Time 2683    PT Stop Time 1455    PT Time Calculation (min) 57 min    Activity Tolerance Patient tolerated treatment well    Behavior During Therapy [facility] for tasks assessed/performed           Past Medical History:  Diagnosis Date   Arthritis    Heart murmur    HOH (hard of hearing)    HTN (hypertension)    since pregnant at age 70   Hyperlipemia    PAF (paroxysmal atrial fibrillation) ([place])    a. dx on 06/2015 admission. spontaneously converted into NSR. placed on Eliquis    Past Surgical History:  Procedure Laterality Date   ABDOMINAL HYSTERECTOMY     BREAST CYST EXCISION Left    BREAST SURGERY Left    lumpectomy 35 yrs ago   NECK SURGERY     discectomy with cadaver bone    There were no vitals filed for this visit.   Subjective Assessment - 04/12/20 1401    Subjective Patient reports that overall she is doing better, less neck pain, less dizziness, she does continue to report lightheaded feeling, unsure of what causes the issue    Currently in Pain? Yes    Pain Score 2     Pain Location Neck    Pain Orientation Left    Pain Descriptors / Indicators Spasm;Tightness    Pain Relieving Factors treatment helps                             [facility] Adult PT Treatment/Exercise - 04/12/20 0001      Neck Exercises: Machines for Strengthening   Nustep level 4 x 6 minutes      Moist Heat Therapy   Number Minutes Moist Heat 10 Minutes    Moist Heat Location Cervical       Electrical Stimulation   Electrical Stimulation Location cervical    Electrical Stimulation Action IFC    Electrical Stimulation Parameters sitting    Electrical Stimulation Goals Pain      Manual Therapy   Manual Therapy Soft tissue mobilization    Manual therapy comments passive ROM of the cervical spine with gentle stretches, occipital release    Soft tissue mobilization left upper trap, rhomboid and neck, gentle occipital release            Trigger Point Dry Needling - 04/12/20 0001    Consent Given? Yes    Muscles Treated Head and Neck Upper trapezius;Oblique capitus;Suboccipitals    Upper Trapezius Response Twitch reponse elicited;Palpable increased muscle length    Oblique Capitus Response Palpable increased muscle length    Suboccipitals Response Twitch response elicited                PT Education - 04/12/20 1442    Education Details went over VOR, brandt-daroff, cervical and scapular stretches and stabilization    Person(s) Educated Patient    Methods Explanation;Demonstration;Tactile cues;Verbal cues;Handout  Comprehension Verbalized understanding;Returned demonstration;Verbal cues required            PT Short Term Goals - 02/22/20 1713      PT SHORT TERM GOAL #1   Title independent with initial HEP    Status Achieved             PT Long Term Goals - 04/12/20 1705      PT LONG TERM GOAL #1   Title independent with advanced HEP including vestibular exercises    Status Achieved      PT LONG TERM GOAL #2   Title decrease neck pain 25%    Status Achieved      PT LONG TERM GOAL #3   Title increase cervical ROM 25%    Status Achieved                 Plan - 04/12/20 1703    Clinical Impression Statement Patient doing very well, less vertigo, has some lightheadedness at times, reports driving again, walking and going to exercise class.  She has increase ROM, and less pain in the neck.  She feels like she has a good understanding of the  HEP    PT Next Visit Plan will hold treatment and see how she does on her own, she has been doing very well.    Consulted and Agree with Plan of Care Patient           Patient will benefit from skilled therapeutic intervention in order to improve the following deficits and impairments:  Dizziness, Decreased range of motion, Pain, Increased muscle spasms, Decreased balance  Visit Diagnosis: Dizziness and giddiness  Cervicalgia  BPPV (benign paroxysmal positional vertigo), bilateral     Problem List Patient Active Problem List   Diagnosis Date Noted   Degenerative disc disease, cervical 03/20/2020   Chronic anticoagulation 07/04/2015   HOH (hard of hearing)    Hyperlipemia    PAF (paroxysmal atrial fibrillation) ([place]) 06/23/2015   HTN (hypertension)     Sumner Boast., PT 04/12/2020, 5:06 PM  [place], [place], [address] Phone: [phone]   Fax:  [phone]  Name: Shannon Liu MRN: [id number] Date of Birth: 1941-02-11

## 2020-04-13 ENCOUNTER — Encounter: Payer: Self-pay | Admitting: Physical Therapy

## 2020-04-25 ENCOUNTER — Ambulatory Visit: Payer: Medicare PPO | Admitting: Family Medicine

## 2020-04-27 DIAGNOSIS — I480 Paroxysmal atrial fibrillation: Secondary | ICD-10-CM | POA: Diagnosis not present

## 2020-04-27 DIAGNOSIS — E7800 Pure hypercholesterolemia, unspecified: Secondary | ICD-10-CM | POA: Diagnosis not present

## 2020-04-27 DIAGNOSIS — I10 Essential (primary) hypertension: Secondary | ICD-10-CM | POA: Diagnosis not present

## 2020-04-28 DIAGNOSIS — G4733 Obstructive sleep apnea (adult) (pediatric): Secondary | ICD-10-CM | POA: Diagnosis not present

## 2020-05-29 DIAGNOSIS — G4733 Obstructive sleep apnea (adult) (pediatric): Secondary | ICD-10-CM | POA: Diagnosis not present

## 2020-06-12 ENCOUNTER — Encounter: Payer: Self-pay | Admitting: Family Medicine

## 2020-06-21 ENCOUNTER — Encounter: Payer: Self-pay | Admitting: Family Medicine

## 2020-06-21 ENCOUNTER — Ambulatory Visit: Payer: Self-pay

## 2020-06-21 ENCOUNTER — Other Ambulatory Visit: Payer: Self-pay

## 2020-06-21 ENCOUNTER — Ambulatory Visit (INDEPENDENT_AMBULATORY_CARE_PROVIDER_SITE_OTHER): Payer: Medicare PPO

## 2020-06-21 ENCOUNTER — Ambulatory Visit: Payer: Medicare PPO | Admitting: Family Medicine

## 2020-06-21 VITALS — BP 170/100 | HR 83 | Ht 67.0 in | Wt 211.6 lb

## 2020-06-21 DIAGNOSIS — M25562 Pain in left knee: Secondary | ICD-10-CM | POA: Diagnosis not present

## 2020-06-21 DIAGNOSIS — G8929 Other chronic pain: Secondary | ICD-10-CM

## 2020-06-21 DIAGNOSIS — M1712 Unilateral primary osteoarthritis, left knee: Secondary | ICD-10-CM | POA: Diagnosis not present

## 2020-06-21 NOTE — Progress Notes (Signed)
I, Wendy Poet, LAT, ATC, am serving as scribe for Dr. Lynne Leader.  Shannon Liu is a 79 y.o. female who presents to [place] at [facility] today for knee pain.  She was last seen by Dr. Tamala Julian on 03/20/20 for neck pain.  Since her last visit, pt reports L knee pain x almost 2 years after she injured her knee while bowling when she heard her knee pop.  She states that her L knee has been worsening more recently w/ her L knee giving way occasionally.    L Knee swelling: yes L Knee mechanical symptoms: No Aggravating factors: transitioning from sit-to-stand; stairs; prolonged walking Treatments tried: knee brace; Biofreeze; Tylenol Arthritis; Voltaren gel   Pertinent review of systems: No fevers or chills  Relevant historical information: Hypertension   Exam:  BP (!) 170/100 (BP Location: Right Arm, Patient Position: Sitting, Cuff Size: Large)   Pulse 83   Ht 5\' 7"  (1.702 m)   Wt 211 lb 9.6 oz (96 kg)   SpO2 97%   BMI 33.14 kg/m  General: Well Developed, well nourished, and in no acute distress.   MSK: Left knee normal-appearing Normal motion with mild crepitation. Not particularly tender to palpation. Stable given his exam. Intact strength.    Lab and Radiology Results  X-ray images left knee obtained today personally and independently reviewed Minimal degenerative changes.  No acute fractures. Await formal radiology review  Diagnostic Limited MSK Ultrasound of: Left knee Tendon intact normal-appearing Trace joint effusion superior patellar space. Patellar tendon normal. Medial joint line mildly narrowed mildly degenerative appearing meniscus. Lateral joint line largely normal. Posterior knee no Baker's cyst. Impression: Mild degenerative changes otherwise normal   Procedure: Real-time Ultrasound Guided Injection of left knee superior lateral patellar space Device: Philips Affiniti 50G Images permanently stored and available for  review in PACS Verbal informed consent obtained.  Discussed risks and benefits of procedure. Warned about infection bleeding damage to structures skin hypopigmentation and fat atrophy among others. Patient expresses understanding and agreement Time-out conducted.   Noted no overlying erythema, induration, or other signs of local infection.   Skin prepped in a sterile fashion.   Local anesthesia: Topical Ethyl chloride.   With sterile technique and under real time ultrasound guidance:  40 mg of Kenalog and 2 mL of Marcaine injected easily.   Completed without difficulty   Pain immediately resolved suggesting accurate placement of the medication.   Advised to call if fevers/chills, erythema, induration, drainage, or persistent bleeding.   Images permanently stored and available for review in the ultrasound unit.  Impression: Technically successful ultrasound guided injection.     Assessment and Plan: 79 y.o. female with left knee pain occurring for the last 2 years after a pop while bowling.  Pain is mostly posterior.  This is concerning for a posterior meniscus tear however no significant meniscus tear visible on ultrasound.  She does not have significant DJD on x-ray or ultrasound.  Discussed options.  Plan for home exercise program working on quad strengthening and steroid injection as above.  Also recommend Voltaren gel.  If not improving patient will notify me consider either PT or MRI or both.    Orders Placed This Encounter  Procedures  . [place] LIMITED JOINT SPACE STRUCTURES LOW LEFT(NO LINKED CHARGES)    Order Specific Question:   Reason for Exam (SYMPTOM  OR DIAGNOSIS REQUIRED)    Answer:   L knee pain    Order Specific Question:  Preferred imaging location?    Answer:   [place]  . DG Knee AP/LAT W/[place] Left    Standing Status:   Future    Number of Occurrences:   1    Standing Expiration Date:   07/21/2020    Order Specific Question:   Reason for  Exam (SYMPTOM  OR DIAGNOSIS REQUIRED)    Answer:   L knee pain    Order Specific Question:   Preferred imaging location?    Answer:   [facility]   No orders of the defined types were placed in this encounter.    Discussed warning signs or symptoms. Please see discharge instructions. Patient expresses understanding.   The above documentation has been reviewed and is accurate and complete Lynne Leader, M.D.

## 2020-06-21 NOTE — Patient Instructions (Signed)
Thank you for coming in today. Continue Voltaren  Call or go to the ER if you develop a large red swollen joint with extreme pain or oozing puss.   If not better next steps are MRI or PT.  Let me know.

## 2020-06-22 DIAGNOSIS — I480 Paroxysmal atrial fibrillation: Secondary | ICD-10-CM | POA: Diagnosis not present

## 2020-06-22 DIAGNOSIS — I10 Essential (primary) hypertension: Secondary | ICD-10-CM | POA: Diagnosis not present

## 2020-06-22 DIAGNOSIS — E7800 Pure hypercholesterolemia, unspecified: Secondary | ICD-10-CM | POA: Diagnosis not present

## 2020-06-22 NOTE — Progress Notes (Signed)
Mild arthritis present

## 2020-06-23 DIAGNOSIS — T162XXA Foreign body in left ear, initial encounter: Secondary | ICD-10-CM | POA: Diagnosis not present

## 2020-06-29 DIAGNOSIS — G4733 Obstructive sleep apnea (adult) (pediatric): Secondary | ICD-10-CM | POA: Diagnosis not present

## 2020-07-03 DIAGNOSIS — Z20828 Contact with and (suspected) exposure to other viral communicable diseases: Secondary | ICD-10-CM | POA: Diagnosis not present

## 2020-07-21 DIAGNOSIS — I480 Paroxysmal atrial fibrillation: Secondary | ICD-10-CM | POA: Diagnosis not present

## 2020-07-21 DIAGNOSIS — I10 Essential (primary) hypertension: Secondary | ICD-10-CM | POA: Diagnosis not present

## 2020-07-21 DIAGNOSIS — E7800 Pure hypercholesterolemia, unspecified: Secondary | ICD-10-CM | POA: Diagnosis not present

## 2020-07-27 DIAGNOSIS — G4733 Obstructive sleep apnea (adult) (pediatric): Secondary | ICD-10-CM | POA: Diagnosis not present

## 2020-07-29 DIAGNOSIS — G4733 Obstructive sleep apnea (adult) (pediatric): Secondary | ICD-10-CM | POA: Diagnosis not present

## 2020-08-09 DIAGNOSIS — H53001 Unspecified amblyopia, right eye: Secondary | ICD-10-CM | POA: Diagnosis not present

## 2020-08-09 DIAGNOSIS — H52203 Unspecified astigmatism, bilateral: Secondary | ICD-10-CM | POA: Diagnosis not present

## 2020-08-09 DIAGNOSIS — H26491 Other secondary cataract, right eye: Secondary | ICD-10-CM | POA: Diagnosis not present

## 2020-08-26 ENCOUNTER — Encounter: Payer: Self-pay | Admitting: Physical Therapy

## 2020-08-29 DIAGNOSIS — G4733 Obstructive sleep apnea (adult) (pediatric): Secondary | ICD-10-CM | POA: Diagnosis not present

## 2020-09-15 DIAGNOSIS — R609 Edema, unspecified: Secondary | ICD-10-CM | POA: Diagnosis not present

## 2020-09-15 DIAGNOSIS — Z1389 Encounter for screening for other disorder: Secondary | ICD-10-CM | POA: Diagnosis not present

## 2020-09-15 DIAGNOSIS — D6869 Other thrombophilia: Secondary | ICD-10-CM | POA: Diagnosis not present

## 2020-09-15 DIAGNOSIS — H8110 Benign paroxysmal vertigo, unspecified ear: Secondary | ICD-10-CM | POA: Diagnosis not present

## 2020-09-15 DIAGNOSIS — Z0000 Encounter for general adult medical examination without abnormal findings: Secondary | ICD-10-CM | POA: Diagnosis not present

## 2020-09-15 DIAGNOSIS — E7800 Pure hypercholesterolemia, unspecified: Secondary | ICD-10-CM | POA: Diagnosis not present

## 2020-09-15 DIAGNOSIS — G4733 Obstructive sleep apnea (adult) (pediatric): Secondary | ICD-10-CM | POA: Diagnosis not present

## 2020-09-15 DIAGNOSIS — I10 Essential (primary) hypertension: Secondary | ICD-10-CM | POA: Diagnosis not present

## 2020-09-15 DIAGNOSIS — I480 Paroxysmal atrial fibrillation: Secondary | ICD-10-CM | POA: Diagnosis not present

## 2020-09-15 DIAGNOSIS — R7301 Impaired fasting glucose: Secondary | ICD-10-CM | POA: Diagnosis not present

## 2020-09-26 ENCOUNTER — Telehealth: Payer: Self-pay | Admitting: Cardiology

## 2020-09-26 DIAGNOSIS — E7800 Pure hypercholesterolemia, unspecified: Secondary | ICD-10-CM | POA: Diagnosis not present

## 2020-09-26 DIAGNOSIS — I10 Essential (primary) hypertension: Secondary | ICD-10-CM | POA: Diagnosis not present

## 2020-09-26 DIAGNOSIS — I480 Paroxysmal atrial fibrillation: Secondary | ICD-10-CM | POA: Diagnosis not present

## 2020-09-26 MED ORDER — APIXABAN 5 MG PO TABS: 5.0000 mg | ORAL_TABLET | Freq: Two times a day (BID) | ORAL | 1 refills | Status: DC

## 2020-09-26 NOTE — Telephone Encounter (Signed)
°*  STAT* If patient is at the pharmacy, call can be transferred to refill team.   1. Which medications need to be refilled? (please list name of each medication and dose if known)   ELIQUIS 5 MG TABS tablet   2. Which pharmacy/location (including street and city if local pharmacy) is medication to be sent to? [facility] - [place], [place] - [place] [address]  3. Do they need a 30 day or 90 day supply? 90 day supply   Pt is currently out of medication.

## 2020-09-26 NOTE — Telephone Encounter (Addendum)
Pt last saw Dr Marlou Porch 02/09/20, last labs 09/15/20 Creat 0.73 at [facility] per [facility], age 79, weight 96kg, based on specified criteria pt is on appropriate dosage of Eliquis 5mg  BID.  Will refill rx.

## 2020-09-26 NOTE — Addendum Note (Signed)
Addended by: Brynda Peon on: 09/26/2020 10:03 AM   Modules accepted: Orders

## 2020-10-11 ENCOUNTER — Other Ambulatory Visit: Payer: Self-pay | Admitting: Internal Medicine

## 2020-10-11 DIAGNOSIS — Z1231 Encounter for screening mammogram for malignant neoplasm of breast: Secondary | ICD-10-CM

## 2020-10-18 ENCOUNTER — Ambulatory Visit
Admission: RE | Admit: 2020-10-18 | Discharge: 2020-10-18 | Disposition: A | Discharge status: Home / Self Care | Payer: Medicare PPO | Source: Ambulatory Visit

## 2020-10-18 ENCOUNTER — Other Ambulatory Visit: Payer: Self-pay

## 2020-10-18 DIAGNOSIS — Z1231 Encounter for screening mammogram for malignant neoplasm of breast: Secondary | ICD-10-CM

## 2020-10-19 ENCOUNTER — Other Ambulatory Visit: Payer: Self-pay | Admitting: Internal Medicine

## 2020-10-19 DIAGNOSIS — R928 Other abnormal and inconclusive findings on diagnostic imaging of breast: Secondary | ICD-10-CM

## 2020-10-19 DIAGNOSIS — I10 Essential (primary) hypertension: Secondary | ICD-10-CM | POA: Diagnosis not present

## 2020-10-19 DIAGNOSIS — F411 Generalized anxiety disorder: Secondary | ICD-10-CM | POA: Diagnosis not present

## 2020-10-19 DIAGNOSIS — M25562 Pain in left knee: Secondary | ICD-10-CM | POA: Diagnosis not present

## 2020-11-01 ENCOUNTER — Ambulatory Visit
Admission: RE | Admit: 2020-11-01 | Discharge: 2020-11-01 | Disposition: A | Discharge status: Home / Self Care | Payer: Medicare PPO | Source: Ambulatory Visit | Attending: Internal Medicine | Admitting: Internal Medicine

## 2020-11-01 ENCOUNTER — Other Ambulatory Visit: Payer: Self-pay

## 2020-11-01 DIAGNOSIS — R928 Other abnormal and inconclusive findings on diagnostic imaging of breast: Secondary | ICD-10-CM

## 2020-11-01 DIAGNOSIS — N6489 Other specified disorders of breast: Secondary | ICD-10-CM | POA: Diagnosis not present

## 2021-01-25 DIAGNOSIS — G4733 Obstructive sleep apnea (adult) (pediatric): Secondary | ICD-10-CM | POA: Diagnosis not present

## 2021-03-02 ENCOUNTER — Ambulatory Visit: Payer: Medicare PPO | Admitting: Cardiology

## 2021-03-02 ENCOUNTER — Encounter: Payer: Self-pay | Admitting: Cardiology

## 2021-03-02 ENCOUNTER — Other Ambulatory Visit: Payer: Self-pay

## 2021-03-02 VITALS — BP 120/70 | HR 70 | Ht 67.0 in | Wt 211.0 lb

## 2021-03-02 DIAGNOSIS — Z7901 Long term (current) use of anticoagulants: Secondary | ICD-10-CM | POA: Diagnosis not present

## 2021-03-02 DIAGNOSIS — I480 Paroxysmal atrial fibrillation: Secondary | ICD-10-CM

## 2021-03-02 NOTE — Progress Notes (Signed)
Cardiology Office Note:    Date:  03/02/2021   ID:  Shannon, Liu 03-29-1941, MRN [id number]  PCP:  Lavone Orn, MD   [facility] Providers Cardiologist:  Candee Furbish, MD     Referring MD: Lavone Orn, MD    History of Present Illness:    Shannon Liu is a 80 y.o. female here for follow up of hypertension, atrial fibullation, and hyperlipidemia. She is using a OSA-CPAP, she has no new complaints.   Previous Visit: She went to the atrial fibrillation clinic in the past. She complained of having vertigo. Her vertigo was present for about 5 to 6 days. Called EMS.  They showed me the EKG strip which was sinus rhythm with PACs at the time.  EMT did a thorough evaluation.  She is also seen Dr. Delene Ruffini team. She is going to physical therapy soon.  Today, she is doing well. She has not had any recent episodes of atrial fibrillation. She has no spontaneous bleeding on Eliquis 5 mg PO BID.  She reports that her blood pressure has improved. She is taking diltiazam 30 mg and telmisartan 80 mg.  BP Readings from Last 3 Encounters:  03/02/21 120/70  06/21/20 (!) 170/100  03/20/20 130/80   She completes physical activity twice a week. She also recently went to [facility] to attend an [employer], she tolerated walking throughout the stadium with no issues. She denies having any exertional shortness of breath, chest pain, tightness, or pressure. She has no lightheadedness, LE edema, syncopal episodes, PND, or orthopnea. She is tolerating her medications well.   Past Medical History:  Diagnosis Date  . Arthritis   . Heart murmur   . HOH (hard of hearing)   . HTN (hypertension)    since pregnant at age 44  . Hyperlipemia   . PAF (paroxysmal atrial fibrillation) ([place])    a. dx on 06/2015 admission. spontaneously converted into NSR. placed on Eliquis    Past Surgical History:  Procedure Laterality Date  . ABDOMINAL HYSTERECTOMY    . BREAST CYST EXCISION Left   .  BREAST SURGERY Left    lumpectomy 35 yrs ago  . NECK SURGERY     discectomy with cadaver bone    Current Medications: Current Meds  Medication Sig  . acetaminophen (TYLENOL) 500 MG tablet Take 500 mg by mouth every 6 (six) hours as needed for mild pain.  Marland Kitchen ALPRAZolam (XANAX) 0.25 MG tablet Take 0.25 mg by mouth 2 (two) times daily as needed for anxiety.  Marland Kitchen apixaban (ELIQUIS) 5 MG TABS tablet Take 1 tablet (5 mg total) by mouth 2 (two) times daily.  Marland Kitchen atorvastatin (LIPITOR) 20 MG tablet TAKE 1 TABLET BY MOUTH EVERY DAY  . atorvastatin (LIPITOR) 40 MG tablet Take 1 tablet by mouth daily.  . chlorthalidone (HYGROTON) 25 MG tablet Take 25 mg by mouth daily.  Marland Kitchen diltiazem (CARDIZEM CD) 120 MG 24 hr capsule TAKE 1 CAPSULE BY MOUTH 2 TIMES DAILY (Patient taking differently: Take 120 mg by mouth daily.)  . diltiazem (CARDIZEM) 30 MG tablet Take 1 tablet every 4 hours AS NEEDED for AFIB heart rate >100  . gabapentin (NEURONTIN) 100 MG capsule Take 2 capsules (200 mg total) by mouth at bedtime.  . Multiple Vitamins-Minerals (MULTIVITAMIN WITH MINERALS) tablet Take 1 tablet by mouth daily.  . potassium chloride (K-DUR) 10 MEQ tablet Take 10 mEq by mouth daily. with food  . Propylene Glycol 0.6 % SOLN Place 1 drop into  both eyes at bedtime.   Marland Kitchen telmisartan (MICARDIS) 80 MG tablet Take 80 mg by mouth daily.     Allergies:   Morphine and related and Penicillins   Social History   Socioeconomic History  . Marital status: Widowed    Spouse name: Not on file  . Number of children: Not on file  . Years of education: Not on file  . Highest education level: Not on file  Occupational History  . Occupation: Retired  Tobacco Use  . Smoking status: Never Smoker  . Smokeless tobacco: Never Used  Vaping Use  . Vaping Use: Never used  Substance and Sexual Activity  . Alcohol use: No    Alcohol/week: 0.0 standard drinks  . Drug use: No  . Sexual activity: Not on file  Other Topics Concern  . Not on  file  [place], near [place]. Daughter currently lives with her.   Social Determinants of Health   Financial Resource Strain: Not on file  Food Insecurity: Not on file  Transportation Needs: Not on file  Physical Activity: Not on file  Stress: Not on file  Social Connections: Not on file     Family History: The patient's family history includes CVA (age of onset: 78) in her mother; Heart attack (age of onset: 57) in her mother; Hypertension in her brother and sister; Unexplained death (age of onset: 56) in her father. There is no history of Breast cancer.  ROS:   Please see the history of present illness. All other systems reviewed and are negative.  EKGs/Labs/Other Studies Reviewed:    The following studies were reviewed today: Echo 06/24/15:  - Left ventricle: The cavity size was normal. Wall thickness was  increased in a pattern of moderate LVH. Systolic function was  mildly reduced. The estimated ejection fraction was in the range  of 45% to 50%. Regional wall motion abnormalities cannot be  excluded. Doppler parameters are consistent with abnormal left  ventricular relaxation (grade 1 diastolic dysfunction).  - Mitral valve: There was mild regurgitation.  - Atrial septum: No defect or patent foramen ovale was identified.  - Pulmonary arteries: PA peak pressure: 31 mm Hg (S).  Stress Test 07/06/15   Nuclear stress EF: 70%.  There was no ST segment deviation noted during stress.  The study is normal. No ischemia. No evidence of infarction  This is a low risk study.   EKG:   03/02/21-Sinus rhythm, rate: 70, PAC  Recent Labs: No results found for requested labs within last 8760 hours.  Recent Lipid Panel    Component Value Date/Time   CHOL 142 06/24/2015 0329   TRIG 118 06/24/2015 0329   HDL 36 (L) 06/24/2015 0329   CHOLHDL 3.9 06/24/2015 0329   VLDL 24 06/24/2015 0329   LDLCALC 82 06/24/2015 0329      Risk Assessment/Calculations:      Physical Exam:    VS:  BP 120/70 (BP Location: Left Arm, Patient Position: Sitting, Cuff Size: Normal)   Pulse 70   Ht 5\' 7"  (1.702 m)   Wt 211 lb (95.7 kg)   SpO2 95%   BMI 33.05 kg/m     Wt Readings from Last 3 Encounters:  03/02/21 211 lb (95.7 kg)  06/21/20 211 lb 9.6 oz (96 kg)  03/20/20 209 lb (94.8 kg)     GEN: Well nourished, well developed in no acute distress HEENT: Normal NECK: No JVD; No carotid bruits LYMPHATICS: No  lymphadenopathy CARDIAC: RRR, no murmurs, rubs, gallops, no carotid bruit RESPIRATORY:  Clear to auscultation without rales, wheezing or rhonchi  ABDOMEN: Soft, non-tender, non-distended MUSCULOSKELETAL:  No edema; No deformity  SKIN: Warm and dry NEUROLOGIC:  Alert and oriented x 3 PSYCHIATRIC:  Normal affect   ASSESSMENT:    1. PAF (paroxysmal atrial fibrillation) ([place])   2. Chronic anticoagulation    PLAN:    In order of problems listed above:  Atrial fibrillation - Has been in the atrial fibrillation clinic.  Paroxysmal.  Overall doing quite well on Cardizem CD 120 mg a day.   Her blood pressure is under excellent control.  We contemplated coming off of the diltiazem but after thinking about this further, she would like to continue with her current drug regimen.  Her blood pressures are excellent.  Chronic anticoagulation-continue with Eliquis with CHA2DS2-VASc score of 4.  No bleeding.  She did asked me if she could come off of her Eliquis.  I would like for her to continue with this.  I did offer her consultation with Dr. Quentin Ore in EP to talk about watchman device.  She will think about this. -Continue with Eliquis 5 mg twice a day medication management with refills as needed.  Obstructive sleep apnea - Continue with CPAP, doing well.  Vertigo - Physical therapy.  Most recent LDL 118 hemoglobin 14.4 creatinine 0.73.  We will go ahead and check a basic metabolic profile and a CBC  today.   1 year follow-up.   Medication Adjustments/Labs and Tests Ordered: Current medicines are reviewed at length with the patient today.  Concerns regarding medicines are outlined above.  Orders Placed This Encounter  Procedures  . CBC  . Basic metabolic panel  . EKG 12-Lead   No orders of the defined types were placed in this encounter.   Patient Instructions  Medication Instructions:  The current medical regimen is effective;  continue present plan and medications.  *If you need a refill on your cardiac medications before your next appointment, please call your pharmacy*  Lab Work: Please have blood work today (CBC,BMP)  If you have labs (blood work) drawn today and your tests are completely normal, you will receive your results only by: Marland Kitchen MyChart Message (if you have MyChart) OR . A paper copy in the mail If you have any lab test that is abnormal or we need to change your treatment, we will call you to review the results.  Follow-Up: At [facility], you and your health needs are our priority.  As part of our continuing mission to provide you with exceptional heart care, we have created designated Provider Care Teams.  These Care Teams include your primary Cardiologist (physician) and Advanced Practice Providers (APPs -  Physician Assistants and Nurse Practitioners) who all work together to provide you with the care you need, when you need it.  We recommend signing up for the patient portal called "MyChart".  Sign up information is provided on this After Visit Summary.  MyChart is used to connect with patients for Virtual Visits (Telemedicine).  Patients are able to view lab/test results, encounter notes, upcoming appointments, etc.  Non-urgent messages can be sent to your provider as well.   To learn more about what you can do with MyChart, go to [email or web address].    Your next appointment:   1 year(s)  The format for your next appointment:   In  Person  Provider:   Candee Furbish, MD   Thank you for  choosing [facility]!!         I,Alexis Bryant,acting as a scribe for [employer], MD.,have documented all relevant documentation on the behalf of Candee Furbish, MD,as directed by  Candee Furbish, MD while in the presence of Candee Furbish, MD.  I, Candee Furbish, MD, have reviewed all documentation for this visit. The documentation on 03/02/21 for the exam, diagnosis, procedures, and orders are all accurate and complete.  Signed, Candee Furbish, MD  03/02/2021 10:49 AM    [facility]

## 2021-03-02 NOTE — Addendum Note (Signed)
Addended by: Shellia Cleverly on: 03/02/2021 05:30 PM   Modules accepted: Orders

## 2021-03-02 NOTE — Patient Instructions (Signed)
Medication Instructions:  The current medical regimen is effective;  continue present plan and medications.  *If you need a refill on your cardiac medications before your next appointment, please call your pharmacy*  Lab Work: Please have blood work today (CBC,BMP)  If you have labs (blood work) drawn today and your tests are completely normal, you will receive your results only by: Marland Kitchen MyChart Message (if you have MyChart) OR . A paper copy in the mail If you have any lab test that is abnormal or we need to change your treatment, we will call you to review the results.  Follow-Up: At [facility], you and your health needs are our priority.  As part of our continuing mission to provide you with exceptional heart care, we have created designated Provider Care Teams.  These Care Teams include your primary Cardiologist (physician) and Advanced Practice Providers (APPs -  Physician Assistants and Nurse Practitioners) who all work together to provide you with the care you need, when you need it.  We recommend signing up for the patient portal called "MyChart".  Sign up information is provided on this After Visit Summary.  MyChart is used to connect with patients for Virtual Visits (Telemedicine).  Patients are able to view lab/test results, encounter notes, upcoming appointments, etc.  Non-urgent messages can be sent to your provider as well.   To learn more about what you can do with MyChart, go to [email or web address].    Your next appointment:   1 year(s)  The format for your next appointment:   In Person  Provider:   Candee Furbish, MD   Thank you for choosing [facility]!!

## 2021-03-03 LAB — BASIC METABOLIC PANEL
BUN/Creatinine Ratio: 24 (ref 12–28)
BUN: 18 mg/dL (ref 8–27)
CO2: 26 mmol/L (ref 20–29)
Calcium: 9.9 mg/dL (ref 8.7–10.3)
Chloride: 102 mmol/L (ref 96–106)
Creatinine, Ser: 0.75 mg/dL (ref 0.57–1.00)
Glucose: 99 mg/dL (ref 65–99)
Potassium: 3.8 mmol/L (ref 3.5–5.2)
Sodium: 144 mmol/L (ref 134–144)
eGFR: 81 mL/min/{1.73_m2} (ref >59)

## 2021-03-03 LAB — CBC
Hematocrit: 41 % (ref 34.0–46.6)
Hemoglobin: 14.1 g/dL (ref 11.1–15.9)
MCH: 30.7 pg (ref 26.6–33.0)
MCHC: 34.4 g/dL (ref 31.5–35.7)
MCV: 89 fL (ref 79–97)
Platelets: 233 10*3/uL (ref 150–450)
RBC: 4.6 x10E6/uL (ref 3.77–5.28)
RDW: 13 % (ref 11.7–15.4)
WBC: 7.4 10*3/uL (ref 3.4–10.8)

## 2021-03-19 ENCOUNTER — Other Ambulatory Visit: Payer: Self-pay | Admitting: Cardiology

## 2021-03-19 NOTE — Telephone Encounter (Signed)
Pt's age 80, wt 95.7 kg, SCr 0.75, CrCl 97.92, last ov w/ MS 03/02/21.

## 2021-03-20 DIAGNOSIS — D6869 Other thrombophilia: Secondary | ICD-10-CM | POA: Diagnosis not present

## 2021-03-20 DIAGNOSIS — M65311 Trigger thumb, right thumb: Secondary | ICD-10-CM | POA: Diagnosis not present

## 2021-03-20 DIAGNOSIS — L989 Disorder of the skin and subcutaneous tissue, unspecified: Secondary | ICD-10-CM | POA: Diagnosis not present

## 2021-03-20 DIAGNOSIS — I480 Paroxysmal atrial fibrillation: Secondary | ICD-10-CM | POA: Diagnosis not present

## 2021-03-20 DIAGNOSIS — I10 Essential (primary) hypertension: Secondary | ICD-10-CM | POA: Diagnosis not present

## 2021-03-20 DIAGNOSIS — E7800 Pure hypercholesterolemia, unspecified: Secondary | ICD-10-CM | POA: Diagnosis not present

## 2021-03-21 ENCOUNTER — Other Ambulatory Visit (HOSPITAL_COMMUNITY): Payer: Self-pay | Admitting: Nurse Practitioner

## 2021-03-29 DIAGNOSIS — M65311 Trigger thumb, right thumb: Secondary | ICD-10-CM | POA: Diagnosis not present

## 2021-04-17 DIAGNOSIS — C44619 Basal cell carcinoma of skin of left upper limb, including shoulder: Secondary | ICD-10-CM | POA: Diagnosis not present

## 2021-04-17 DIAGNOSIS — L820 Inflamed seborrheic keratosis: Secondary | ICD-10-CM | POA: Diagnosis not present

## 2021-04-17 DIAGNOSIS — C44319 Basal cell carcinoma of skin of other parts of face: Secondary | ICD-10-CM | POA: Diagnosis not present

## 2021-06-05 DIAGNOSIS — Z85828 Personal history of other malignant neoplasm of skin: Secondary | ICD-10-CM | POA: Diagnosis not present

## 2021-06-05 DIAGNOSIS — Z08 Encounter for follow-up examination after completed treatment for malignant neoplasm: Secondary | ICD-10-CM | POA: Diagnosis not present

## 2021-06-05 DIAGNOSIS — L820 Inflamed seborrheic keratosis: Secondary | ICD-10-CM | POA: Diagnosis not present

## 2021-07-27 DIAGNOSIS — G4733 Obstructive sleep apnea (adult) (pediatric): Secondary | ICD-10-CM | POA: Diagnosis not present

## 2021-08-29 DIAGNOSIS — T169XXA Foreign body in ear, unspecified ear, initial encounter: Secondary | ICD-10-CM | POA: Diagnosis not present

## 2021-09-01 ENCOUNTER — Other Ambulatory Visit: Payer: Self-pay | Admitting: Cardiology

## 2021-09-03 NOTE — Telephone Encounter (Signed)
Pt last saw Dr Marlou Porch 03/02/21, last labs 03/02/21 Creat 0.75, age 80, weight 95.7kg, based on specified criteria pt is on appropriate dosage of Eliquis 5mg  BID for afib.  Will refill rx.

## 2021-09-21 DIAGNOSIS — G4733 Obstructive sleep apnea (adult) (pediatric): Secondary | ICD-10-CM | POA: Diagnosis not present

## 2021-09-21 DIAGNOSIS — R7301 Impaired fasting glucose: Secondary | ICD-10-CM | POA: Diagnosis not present

## 2021-09-21 DIAGNOSIS — I480 Paroxysmal atrial fibrillation: Secondary | ICD-10-CM | POA: Diagnosis not present

## 2021-09-21 DIAGNOSIS — E7800 Pure hypercholesterolemia, unspecified: Secondary | ICD-10-CM | POA: Diagnosis not present

## 2021-09-21 DIAGNOSIS — D6869 Other thrombophilia: Secondary | ICD-10-CM | POA: Diagnosis not present

## 2021-09-21 DIAGNOSIS — Z0000 Encounter for general adult medical examination without abnormal findings: Secondary | ICD-10-CM | POA: Diagnosis not present

## 2021-09-21 DIAGNOSIS — F411 Generalized anxiety disorder: Secondary | ICD-10-CM | POA: Diagnosis not present

## 2021-09-21 DIAGNOSIS — I10 Essential (primary) hypertension: Secondary | ICD-10-CM | POA: Diagnosis not present

## 2021-09-21 DIAGNOSIS — Z1389 Encounter for screening for other disorder: Secondary | ICD-10-CM | POA: Diagnosis not present

## 2021-09-25 ENCOUNTER — Other Ambulatory Visit: Payer: Self-pay | Admitting: Internal Medicine

## 2021-09-25 DIAGNOSIS — Z1231 Encounter for screening mammogram for malignant neoplasm of breast: Secondary | ICD-10-CM

## 2021-10-01 DIAGNOSIS — H52203 Unspecified astigmatism, bilateral: Secondary | ICD-10-CM | POA: Diagnosis not present

## 2021-10-01 DIAGNOSIS — H26491 Other secondary cataract, right eye: Secondary | ICD-10-CM | POA: Diagnosis not present

## 2021-10-01 DIAGNOSIS — H53001 Unspecified amblyopia, right eye: Secondary | ICD-10-CM | POA: Diagnosis not present

## 2021-10-01 DIAGNOSIS — Z961 Presence of intraocular lens: Secondary | ICD-10-CM | POA: Diagnosis not present

## 2021-10-14 DIAGNOSIS — C801 Malignant (primary) neoplasm, unspecified: Secondary | ICD-10-CM

## 2021-10-14 HISTORY — DX: Malignant (primary) neoplasm, unspecified: C80.1

## 2021-11-05 ENCOUNTER — Ambulatory Visit
Admission: RE | Admit: 2021-11-05 | Discharge: 2021-11-05 | Disposition: A | Discharge status: Home / Self Care | Payer: Medicare PPO | Source: Ambulatory Visit

## 2021-11-05 DIAGNOSIS — Z1231 Encounter for screening mammogram for malignant neoplasm of breast: Secondary | ICD-10-CM

## 2021-11-06 ENCOUNTER — Other Ambulatory Visit: Payer: Self-pay | Admitting: Internal Medicine

## 2021-11-06 DIAGNOSIS — R928 Other abnormal and inconclusive findings on diagnostic imaging of breast: Secondary | ICD-10-CM

## 2021-11-28 ENCOUNTER — Ambulatory Visit
Admission: RE | Admit: 2021-11-28 | Discharge: 2021-11-28 | Disposition: A | Discharge status: Home / Self Care | Payer: Medicare PPO | Source: Ambulatory Visit | Attending: Internal Medicine | Admitting: Internal Medicine

## 2021-11-28 ENCOUNTER — Other Ambulatory Visit: Payer: Self-pay | Admitting: Internal Medicine

## 2021-11-28 DIAGNOSIS — R928 Other abnormal and inconclusive findings on diagnostic imaging of breast: Secondary | ICD-10-CM

## 2021-11-28 DIAGNOSIS — N6311 Unspecified lump in the right breast, upper outer quadrant: Secondary | ICD-10-CM | POA: Diagnosis not present

## 2021-11-28 DIAGNOSIS — R922 Inconclusive mammogram: Secondary | ICD-10-CM | POA: Diagnosis not present

## 2021-11-28 DIAGNOSIS — N6310 Unspecified lump in the right breast, unspecified quadrant: Secondary | ICD-10-CM

## 2021-12-02 DIAGNOSIS — R0781 Pleurodynia: Secondary | ICD-10-CM | POA: Diagnosis not present

## 2021-12-03 ENCOUNTER — Other Ambulatory Visit (HOSPITAL_COMMUNITY): Payer: Self-pay | Admitting: Cardiology

## 2021-12-05 ENCOUNTER — Other Ambulatory Visit (HOSPITAL_COMMUNITY): Payer: Self-pay

## 2021-12-06 ENCOUNTER — Other Ambulatory Visit: Payer: Medicare PPO

## 2021-12-06 ENCOUNTER — Ambulatory Visit
Admission: RE | Admit: 2021-12-06 | Discharge: 2021-12-06 | Disposition: A | Discharge status: Home / Self Care | Payer: Medicare PPO | Source: Ambulatory Visit | Attending: Internal Medicine | Admitting: Internal Medicine

## 2021-12-06 DIAGNOSIS — Z170 Estrogen receptor positive status [ER+]: Secondary | ICD-10-CM | POA: Diagnosis not present

## 2021-12-06 DIAGNOSIS — N6311 Unspecified lump in the right breast, upper outer quadrant: Secondary | ICD-10-CM | POA: Diagnosis not present

## 2021-12-06 DIAGNOSIS — N6310 Unspecified lump in the right breast, unspecified quadrant: Secondary | ICD-10-CM

## 2021-12-06 DIAGNOSIS — C50411 Malignant neoplasm of upper-outer quadrant of right female breast: Secondary | ICD-10-CM | POA: Diagnosis not present

## 2021-12-06 HISTORY — PX: BREAST BIOPSY: SHX20

## 2021-12-10 ENCOUNTER — Telehealth: Payer: Self-pay | Admitting: Hematology

## 2021-12-10 ENCOUNTER — Other Ambulatory Visit: Payer: Self-pay | Admitting: *Deleted

## 2021-12-10 ENCOUNTER — Encounter: Payer: Self-pay | Admitting: *Deleted

## 2021-12-10 DIAGNOSIS — C50411 Malignant neoplasm of upper-outer quadrant of right female breast: Secondary | ICD-10-CM

## 2021-12-10 DIAGNOSIS — Z170 Estrogen receptor positive status [ER+]: Secondary | ICD-10-CM

## 2021-12-10 NOTE — Telephone Encounter (Signed)
Patient returned call to confirm afternoon appointment for 3/1, paperwork emailed to patient

## 2021-12-10 NOTE — Progress Notes (Signed)
Radiation Oncology         [phone] ________________________________  Name: Shannon Liu        MRN: [id number]  Date of Service: 12/12/2021 DOB: [birth year]  IW:[id number], Jenny Reichmann, MD  Erroll Luna, MD     REFERRING PHYSICIAN: Erroll Luna, MD   DIAGNOSIS: The encounter diagnosis was Malignant neoplasm of upper-outer quadrant of right breast in female, estrogen receptor positive ([place]).   HISTORY OF PRESENT ILLNESS: Shannon Liu is a 81 y.o. female seen in the multidisciplinary breast clinic for a new diagnosis of right breast cancer. The patient was noted to have a left lumpectomy many years ago for resection of a cyst.  She was recently found to have a screening detected mass in the right breast in the 10 o'clock position by diagnostic ultrasound.  This measured 9 mm, and her axilla was negative for adenopathy.  She underwent a biopsy  on 12/06/2021 which showed a grade 2 invasive ductal carcinoma with lobular features, her tumor was ER/PR positive, HER2 was negative by FISH.  She is seen to discuss treatment recommendations of her cancer.    PREVIOUS RADIATION THERAPY: No   PAST MEDICAL HISTORY:  Past Medical History:  Diagnosis Date   Arthritis    Heart murmur    HOH (hard of hearing)    HTN (hypertension)    since pregnant at age 46   Hyperlipemia    PAF (paroxysmal atrial fibrillation) ([place])    a. dx on 06/2015 admission. spontaneously converted into NSR. placed on Eliquis       PAST SURGICAL HISTORY: Past Surgical History:  Procedure Laterality Date   ABDOMINAL HYSTERECTOMY     BREAST CYST EXCISION Left    BREAST SURGERY Left    lumpectomy 35 yrs ago   NECK SURGERY     discectomy with cadaver bone     FAMILY HISTORY:  Family History  Problem Relation Age of Onset   Heart attack Mother 37   CVA Mother 71   Unexplained death Father 65   Hypertension Sister    Hypertension Brother    Breast cancer Neg Hx      SOCIAL HISTORY:  reports that  she has never smoked. She has never used smokeless tobacco. She reports that she does not drink alcohol and does not use drugs. The patient is widowed and lives in [place]. She is retired from working for court system both state and federal. She's accompanied by her daughter Rosalyn Charters whom she lives with and her granddaughter. She enjoys spending time with family.    ALLERGIES: Morphine and related and Penicillins   MEDICATIONS:  Current Outpatient Medications  Medication Sig Dispense Refill   acetaminophen (TYLENOL) 500 MG tablet Take 500 mg by mouth every 6 (six) hours as needed for mild pain.     ALPRAZolam (XANAX) 0.25 MG tablet Take 0.25 mg by mouth 2 (two) times daily as needed for anxiety.     atorvastatin (LIPITOR) 40 MG tablet Take 1 tablet by mouth daily.     chlorthalidone (HYGROTON) 25 MG tablet Take 25 mg by mouth daily.     diltiazem (CARDIZEM CD) 120 MG 24 hr capsule Take 1 capsule (120 mg total) by mouth 2 (two) times daily. Please make yearly appt with Dr. Marlou Porch for May 2023 for future refills. Thank you 1st attempt 180 capsule 0   diltiazem (CARDIZEM) 30 MG tablet Take 1 tablet every 4 hours AS NEEDED for AFIB heart rate >100 45 tablet  1   ELIQUIS 5 MG TABS tablet TAKE ONE TABLET BY MOUTH TWICE DAILY 180 tablet 1   Multiple Vitamins-Minerals (MULTIVITAMIN WITH MINERALS) tablet Take 1 tablet by mouth daily.     potassium chloride (K-DUR) 10 MEQ tablet Take 10 mEq by mouth daily. with food     Propylene Glycol 0.6 % SOLN Place 1 drop into both eyes at bedtime.      telmisartan (MICARDIS) 80 MG tablet Take 80 mg by mouth daily.     No current facility-administered medications for this visit.     REVIEW OF SYSTEMS: On review of systems, the patient reports that she is doing well overall. She did fall over the weekend and has had some pains in the right side of her body. She occasionally has palpitations but no chest pain or shortness of breath. No other complaints are  verbalized.      PHYSICAL EXAM:  Wt Readings from Last 3 Encounters:  03/02/21 211 lb (95.7 kg)  06/21/20 211 lb 9.6 oz (96 kg)  03/20/20 209 lb (94.8 kg)   Temp Readings from Last 3 Encounters:  06/24/15 98.2 F (36.8 C) (Oral)   BP Readings from Last 3 Encounters:  03/02/21 120/70  06/21/20 (!) 170/100  03/20/20 130/80   Pulse Readings from Last 3 Encounters:  03/02/21 70  06/21/20 83  03/20/20 85    In general this is a well appearing caucasian female in no acute distress. She's alert and oriented x4 and appropriate throughout the examination. Cardiopulmonary assessment is negative for acute distress and she exhibits normal effort. Bilateral breast exam is deferred.    ECOG = 1  0 - Asymptomatic (Fully active, able to carry on all predisease activities without restriction)  1 - Symptomatic but completely ambulatory (Restricted in physically strenuous activity but ambulatory and able to carry out work of a light or sedentary nature. For example, light housework, office work)  2 - Symptomatic, <50% in bed during the day (Ambulatory and capable of all self care but unable to carry out any work activities. Up and about more than 50% of waking hours)  3 - Symptomatic, >50% in bed, but not bedbound (Capable of only limited self-care, confined to bed or chair 50% or more of waking hours)  4 - Bedbound (Completely disabled. Cannot carry on any self-care. Totally confined to bed or chair)  5 - Death   Eustace Pen MM, Creech RH, Tormey DC, et al. [phone]). "Toxicity and response criteria of the [facility]". [place] Oncol. 5 (6): 649-55    LABORATORY DATA:  Lab Results  Component Value Date   WBC 7.4 03/02/2021   HGB 14.1 03/02/2021   HCT 41.0 03/02/2021   MCV 89 03/02/2021   PLT 233 03/02/2021   Lab Results  Component Value Date   NA 144 03/02/2021   K 3.8 03/02/2021   CL 102 03/02/2021   CO2 26 03/02/2021   Lab Results  Component Value  Date   ALT 19 06/24/2015   AST 26 06/24/2015   ALKPHOS 91 06/24/2015   BILITOT 0.7 06/24/2015      RADIOGRAPHY: US BREAST LTD UNI RIGHT INC AXILLA  Result Date: 11/28/2021 CLINICAL DATA:  Patient returns today to evaluate possible RIGHT breast asymmetries identified on recent screening mammogram. EXAM: DIGITAL DIAGNOSTIC UNILATERAL RIGHT MAMMOGRAM WITH TOMOSYNTHESIS AND CAD; ULTRASOUND RIGHT BREAST LIMITED TECHNIQUE: Right digital diagnostic mammography and breast tomosynthesis was performed. The images were evaluated with computer-aided detection.; Targeted ultrasound  examination of the right breast was performed COMPARISON:  Previous exams including recent screening mammogram dated 11/05/2021 ACR Breast Density Category b: There are scattered areas of fibroglandular density. FINDINGS: On today's additional diagnostic views, an irregular mass is confirmed within the upper-outer quadrant of the RIGHT breast, with associated architectural distortion, measuring approximately 8 mm greatest dimension. There is an additional partially obscured low-density mass within the lower inner quadrant of the RIGHT breast, measuring approximately 5 mm. Targeted ultrasound is performed, showing an irregular hypoechoic mass in the RIGHT breast at the 10 o'clock axis, 7 cm from the nipple, measuring 9 x 6 x 9 mm, with associated architectural distortion, corresponding to the suspicious mammographic finding. Adjacent to the mass is a benign dystrophic calcification at the 10 o'clock axis, 6 cm from the nipple, measuring 3 mm, corresponding as an incidental finding. Several benign cysts are seen within the inner and lower RIGHT breast, 4-6 o'clock axes, largest measuring 9 mm, corresponding to the additional mammographic asymmetry. RIGHT axilla was evaluated with ultrasound showing no enlarged or morphologically abnormal lymph nodes. IMPRESSION: 1. Irregular hypoechoic mass in the RIGHT breast at the 10 o'clock axis, 7 cm from  the nipple, measuring 9 mm, with associated architectural distortion, corresponding to the mammographic finding. This is a highly suspicious finding for which ultrasound-guided core biopsy is recommended. 2. Additional benign findings within the RIGHT breast, as detailed above. 3. No enlarged or morphologically abnormal lymph nodes in the RIGHT axilla. RECOMMENDATION: Ultrasound-guided core biopsy for the irregular hypoechoic mass in the RIGHT breast at the 10 o'clock axis, 7 cm from the nipple, measuring 9 mm, with associated architectural distortion. Ultrasound-guided biopsy is scheduled on February 23rd. I have discussed the findings and recommendations with the patient. If applicable, a reminder letter will be sent to the patient regarding the next appointment. BI-RADS CATEGORY  5: Highly suggestive of malignancy. Electronically Signed   By: Franki Cabot M.D.   On: 11/28/2021 11:38  MM DIAG BREAST TOMO UNI RIGHT  Result Date: 11/28/2021 CLINICAL DATA:  Patient returns today to evaluate possible RIGHT breast asymmetries identified on recent screening mammogram. EXAM: DIGITAL DIAGNOSTIC UNILATERAL RIGHT MAMMOGRAM WITH TOMOSYNTHESIS AND CAD; ULTRASOUND RIGHT BREAST LIMITED TECHNIQUE: Right digital diagnostic mammography and breast tomosynthesis was performed. The images were evaluated with computer-aided detection.; Targeted ultrasound examination of the right breast was performed COMPARISON:  Previous exams including recent screening mammogram dated 11/05/2021 ACR Breast Density Category b: There are scattered areas of fibroglandular density. FINDINGS: On today's additional diagnostic views, an irregular mass is confirmed within the upper-outer quadrant of the RIGHT breast, with associated architectural distortion, measuring approximately 8 mm greatest dimension. There is an additional partially obscured low-density mass within the lower inner quadrant of the RIGHT breast, measuring approximately 5 mm.  Targeted ultrasound is performed, showing an irregular hypoechoic mass in the RIGHT breast at the 10 o'clock axis, 7 cm from the nipple, measuring 9 x 6 x 9 mm, with associated architectural distortion, corresponding to the suspicious mammographic finding. Adjacent to the mass is a benign dystrophic calcification at the 10 o'clock axis, 6 cm from the nipple, measuring 3 mm, corresponding as an incidental finding. Several benign cysts are seen within the inner and lower RIGHT breast, 4-6 o'clock axes, largest measuring 9 mm, corresponding to the additional mammographic asymmetry. RIGHT axilla was evaluated with ultrasound showing no enlarged or morphologically abnormal lymph nodes. IMPRESSION: 1. Irregular hypoechoic mass in the RIGHT breast at the 10 o'clock axis, 7 cm  from the nipple, measuring 9 mm, with associated architectural distortion, corresponding to the mammographic finding. This is a highly suspicious finding for which ultrasound-guided core biopsy is recommended. 2. Additional benign findings within the RIGHT breast, as detailed above. 3. No enlarged or morphologically abnormal lymph nodes in the RIGHT axilla. RECOMMENDATION: Ultrasound-guided core biopsy for the irregular hypoechoic mass in the RIGHT breast at the 10 o'clock axis, 7 cm from the nipple, measuring 9 mm, with associated architectural distortion. Ultrasound-guided biopsy is scheduled on February 23rd. I have discussed the findings and recommendations with the patient. If applicable, a reminder letter will be sent to the patient regarding the next appointment. BI-RADS CATEGORY  5: Highly suggestive of malignancy. Electronically Signed   By: Franki Cabot M.D.   On: 11/28/2021 11:38  MM CLIP PLACEMENT RIGHT  Result Date: 12/06/2021 CLINICAL DATA:  Patient status post ultrasound-guided biopsy right breast mass. EXAM: 3D DIAGNOSTIC RIGHT MAMMOGRAM POST ULTRASOUND BIOPSY COMPARISON:  Previous exam(s). FINDINGS: 3D Mammographic images were  obtained following ultrasound guided biopsy of right breast mass. The biopsy marking clip is in expected position at the site of biopsy. IMPRESSION: Appropriate positioning of the ribbon shaped biopsy marking clip at the site of biopsy in the upper-outer right breast. Final Assessment: Post Procedure Mammograms for Marker Placement Electronically Signed   By: Lovey Newcomer M.D.   On: 12/06/2021 09:40  [place] RT BREAST BX W LOC DEV 1ST LESION IMG BX SPEC US GUIDE  Addendum Date: 12/08/2021   ADDENDUM REPORT: 12/08/2021 09:03 ADDENDUM: Pathology revealed GRADE II INVASIVE DUCTAL CARCINOMA WITH LOBULAR FEATURES of the RIGHT breast, 10 o'clock, 7cmfn, (ribbon clip). This was found to be concordant by Dr. Lovey Newcomer. Pathology results were discussed with the patient by telephone. The patient reported doing well after the biopsy with tenderness at the site. Post biopsy instructions and care were reviewed and questions were answered. The patient was encouraged to call [facility] for any additional concerns. My direct phone number was provided. The patient was referred to [facility] on December 12, 2021. Pathology results reported by Terie Purser, RN on 12/07/2021. Electronically Signed   By: Lovey Newcomer M.D.   On: 12/08/2021 09:03   Result Date: 12/08/2021 CLINICAL DATA:  Patient with indeterminate right breast mass 10 o'clock position EXAM: ULTRASOUND GUIDED RIGHT BREAST CORE NEEDLE BIOPSY COMPARISON:  Previous exam(s). PROCEDURE: I met with the patient and we discussed the procedure of ultrasound-guided biopsy, including benefits and alternatives. We discussed the high likelihood of a successful procedure. We discussed the risks of the procedure, including infection, bleeding, tissue injury, clip migration, and inadequate sampling. Informed written consent was given. The usual time-out protocol was performed immediately  prior to the procedure. Lesion quadrant: Upper outer quadrant Using sterile technique and 1% Lidocaine as local anesthetic, under direct ultrasound visualization, a 14 gauge spring-loaded device was used to perform biopsy of right breast mass 10 o'clock position using a lateral approach. At the conclusion of the procedure ribbon tissue marker clip was deployed into the biopsy cavity. Follow up 2 view mammogram was performed and dictated separately. IMPRESSION: Ultrasound guided biopsy of right breast mass 10 o'clock position. No apparent complications. Electronically Signed: By: Lovey Newcomer M.D. On: 12/06/2021 09:40      IMPRESSION/PLAN: 1. Stage IA, [id number] grade 2, ER/PR positive invasive ductal carcinoma with lobular features of the right breast. Dr. Lisbeth Renshaw discusses the pathology findings  and reviews the nature of early stage breast disease. The consensus from the breast conference includes breast conservation with lumpectomy.  Dr. Lisbeth Renshaw discusses the rationale for adjuvant external radiotherapy to the breast  to reduce risks of local recurrence followed by antiestrogen therapy. Dr. Lisbeth Renshaw also discusses cases in which radiation may be optional for favorable cases based on final pathology. We discussed the risks, benefits, short, and long term effects of radiotherapy, as well as the curative intent, and the patient is interested in avoiding radiation if this is appropriate. Dr. Lisbeth Renshaw discusses the delivery and logistics of radiotherapy and if she did need radiation or changed her mind and was interested in therapy, she would be offered 4 weeks of radiotherapy. We will see her back as needed based on final pathology results. 2. Possible genetic predisposition to malignancy. The patient is a candidate for genetic testing given her personal  history. She will meet with genetics in clinic today.  In a visit lasting 60 minutes, greater than 50% of the time was spent face to face reviewing her case, as well as  in preparation of, discussing, and coordinating the patient's care.  The above documentation reflects my direct findings during this shared patient visit. Please see the separate note by Dr. Lisbeth Renshaw on this date for the remainder of the patient's plan of care.    Carola Rhine, [facility]    **Disclaimer: This note was dictated with voice recognition software. Similar sounding words can inadvertently be transcribed and this note may contain transcription errors which may not have been corrected upon publication of note.**

## 2021-12-10 NOTE — Telephone Encounter (Signed)
Left message with location, date and time of appointment for 3/1, asked patient to return call to confirm appointment

## 2021-12-12 ENCOUNTER — Encounter: Payer: Self-pay | Admitting: Hematology

## 2021-12-12 ENCOUNTER — Other Ambulatory Visit: Payer: Self-pay

## 2021-12-12 ENCOUNTER — Inpatient Hospital Stay (HOSPITAL_BASED_OUTPATIENT_CLINIC_OR_DEPARTMENT_OTHER): Payer: Medicare PPO | Admitting: Hematology

## 2021-12-12 ENCOUNTER — Ambulatory Visit
Admission: RE | Admit: 2021-12-12 | Discharge: 2021-12-12 | Disposition: A | Discharge status: Home / Self Care | Payer: Medicare PPO | Source: Ambulatory Visit | Attending: Radiation Oncology | Admitting: Radiation Oncology

## 2021-12-12 ENCOUNTER — Ambulatory Visit (HOSPITAL_BASED_OUTPATIENT_CLINIC_OR_DEPARTMENT_OTHER): Payer: Medicare PPO | Admitting: Genetic Counselor

## 2021-12-12 ENCOUNTER — Encounter: Payer: Self-pay | Admitting: *Deleted

## 2021-12-12 ENCOUNTER — Ambulatory Visit: Payer: Medicare PPO | Admitting: Physical Therapy

## 2021-12-12 ENCOUNTER — Ambulatory Visit: Payer: Self-pay | Admitting: Surgery

## 2021-12-12 ENCOUNTER — Inpatient Hospital Stay: Payer: Medicare PPO | Attending: Hematology

## 2021-12-12 VITALS — BP 171/80 | HR 87 | Temp 97.9°F | Resp 18 | Ht 67.0 in | Wt 212.8 lb

## 2021-12-12 DIAGNOSIS — I480 Paroxysmal atrial fibrillation: Secondary | ICD-10-CM | POA: Insufficient documentation

## 2021-12-12 DIAGNOSIS — C50411 Malignant neoplasm of upper-outer quadrant of right female breast: Secondary | ICD-10-CM | POA: Insufficient documentation

## 2021-12-12 DIAGNOSIS — Z7901 Long term (current) use of anticoagulants: Secondary | ICD-10-CM

## 2021-12-12 DIAGNOSIS — Z170 Estrogen receptor positive status [ER+]: Secondary | ICD-10-CM

## 2021-12-12 DIAGNOSIS — Z79899 Other long term (current) drug therapy: Secondary | ICD-10-CM | POA: Diagnosis not present

## 2021-12-12 DIAGNOSIS — C50911 Malignant neoplasm of unspecified site of right female breast: Secondary | ICD-10-CM

## 2021-12-12 DIAGNOSIS — I10 Essential (primary) hypertension: Secondary | ICD-10-CM

## 2021-12-12 DIAGNOSIS — Z8042 Family history of malignant neoplasm of prostate: Secondary | ICD-10-CM

## 2021-12-12 DIAGNOSIS — Z800 Family history of malignant neoplasm of digestive organs: Secondary | ICD-10-CM | POA: Diagnosis not present

## 2021-12-12 DIAGNOSIS — Z853 Personal history of malignant neoplasm of breast: Secondary | ICD-10-CM | POA: Diagnosis not present

## 2021-12-12 LAB — CMP (CANCER CENTER ONLY)
ALT: 14 U/L (ref 0–44)
AST: 17 U/L (ref 15–41)
Albumin: 4.2 g/dL (ref 3.5–5.0)
Alkaline Phosphatase: 141 U/L — ABNORMAL HIGH (ref 38–126)
Anion gap: 8 (ref 5–15)
BUN: 21 mg/dL (ref 8–23)
CO2: 30 mmol/L (ref 22–32)
Calcium: 9.8 mg/dL (ref 8.9–10.3)
Chloride: 103 mmol/L (ref 98–111)
Creatinine: 0.76 mg/dL (ref 0.44–1.00)
GFR, Estimated: >60 mL/min (ref >60)
Glucose, Bld: 109 mg/dL — ABNORMAL HIGH (ref 70–99)
Potassium: 3.4 mmol/L — ABNORMAL LOW (ref 3.5–5.1)
Sodium: 141 mmol/L (ref 135–145)
Total Bilirubin: 0.7 mg/dL (ref 0.3–1.2)
Total Protein: 6.9 g/dL (ref 6.5–8.1)

## 2021-12-12 LAB — CBC WITH DIFFERENTIAL (CANCER CENTER ONLY)
Abs Immature Granulocytes: 0.02 10*3/uL (ref 0.00–0.07)
Basophils Absolute: 0.1 10*3/uL (ref 0.0–0.1)
Basophils Relative: 1 %
Eosinophils Absolute: 0.2 10*3/uL (ref 0.0–0.5)
Eosinophils Relative: 2 %
HCT: 38.3 % (ref 36.0–46.0)
Hemoglobin: 13.1 g/dL (ref 12.0–15.0)
Immature Granulocytes: 0 %
Lymphocytes Relative: 20 %
Lymphs Abs: 1.7 10*3/uL (ref 0.7–4.0)
MCH: 30.3 pg (ref 26.0–34.0)
MCHC: 34.2 g/dL (ref 30.0–36.0)
MCV: 88.7 fL (ref 80.0–100.0)
Monocytes Absolute: 0.9 10*3/uL (ref 0.1–1.0)
Monocytes Relative: 11 %
Neutro Abs: 5.8 10*3/uL (ref 1.7–7.7)
Neutrophils Relative %: 66 %
Platelet Count: 254 10*3/uL (ref 150–400)
RBC: 4.32 MIL/uL (ref 3.87–5.11)
RDW: 13.1 % (ref 11.5–15.5)
WBC Count: 8.7 10*3/uL (ref 4.0–10.5)
nRBC: 0 % (ref 0.0–0.2)

## 2021-12-12 LAB — GENETIC SCREENING ORDER

## 2021-12-12 NOTE — Progress Notes (Addendum)
[place]   [phone] Fax:([phone]) [place] Note   Patient Care Team: Lavone Orn, MD as PCP - General (Internal Medicine) Jerline Pain, MD as PCP - Cardiology (Cardiology) Erroll Luna, MD as Consulting Physician (General Surgery) Truitt Merle, MD as Consulting Physician (Hematology) Kyung Rudd, MD as Consulting Physician (Radiation Oncology) Rockwell Germany, RN as Oncology Nurse Navigator Mauro Kaufmann, RN as Oncology Nurse Navigator  Date of Service:  12/12/2021   CHIEF COMPLAINTS/PURPOSE OF CONSULTATION:  Right Breast Cancer, ER+  REFERRING PHYSICIAN:  [facility]   ASSESSMENT & PLAN:  Shannon Liu is a 81 y.o. post-hysterectomy female with  1. Malignant neoplasm of upper-outer quadrant of right breast, invasive ductal carcinoma with lobular features, stage IA, c(T1b, N0), ER+/PR+/HER2-, Grade 2  -found on screening mammogram. Right diagnostic MM and [place] on 11/28/21 showed 9 mm mass at 10 o'clock. Biopsy on 12/06/21 confirmed invasive ductal carcinoma with lobular features. --We discussed her imaging findings and the biopsy results in great details. -Given the early stage disease, she is a candidate for lumpectomy. She is agreeable with that. She was seen by Dr. Brantley Stage today and likely will proceed with surgery soon.  Due to her advanced age and early stage disease, sentinel lymph node biopsy is not needed. -given her age, I do not recommend a Oncotype Dx test. -Giving the strong ER and PR expression in her postmenopausal status, I recommend adjuvant endocrine therapy with aromatase inhibitor for a total of 5 years to reduce the risk of cancer recurrence. Potential benefits and side effects of tamoxifen and AI were discussed with patient and she is interested. -She was also seen by radiation oncologist Dr. Lisbeth Renshaw today. Given her age and noninvasive disease, she will likely forego radiation therapy. -We also  discussed the breast cancer surveillance after her surgery. She will continue annual screening mammogram, self exam, and a routine office visit with lab and exam with [place]. -I encouraged her to have healthy diet and exercise regularly.    PLAN:  -proceed with lumpectomy soon -I will see her back 4 weeks after surgery to finalize her antiestrogen plan -She is interested in our exact science clinical trial, I referred her to research.  Oncology History Overview Note   Cancer Staging  Malignant neoplasm of upper-outer quadrant of right breast in female, estrogen receptor positive ([place]) Staging form: Breast, AJCC 8th Edition - Clinical stage from 12/06/2021: Stage IA (cT1b, cN0, cM0, G2, ER+, PR+, HER2-) - Signed by Truitt Merle, MD on 12/11/2021    Malignant neoplasm of upper-outer quadrant of right breast in female, estrogen receptor positive ([place])  11/28/2021 Mammogram   CLINICAL DATA:  Patient returns today to evaluate possible RIGHT breast asymmetries identified on recent screening mammogram.   EXAM: DIGITAL DIAGNOSTIC UNILATERAL RIGHT MAMMOGRAM WITH TOMOSYNTHESIS AND CAD; ULTRASOUND RIGHT BREAST LIMITED  IMPRESSION: 1. Irregular hypoechoic mass in the RIGHT breast at the 10 o'clock axis, 7 cm from the nipple, measuring 9 mm, with associated architectural distortion, corresponding to the mammographic finding. This is a highly suspicious finding for which ultrasound-guided core biopsy is recommended. 2. Additional benign findings within the RIGHT breast, as detailed above. 3. No enlarged or morphologically abnormal lymph nodes in the RIGHT axilla.   12/06/2021 Cancer Staging   Staging form: Breast, AJCC 8th Edition - Clinical stage from 12/06/2021: Stage IA (cT1b, cN0, cM0, G2, ER+, PR+, HER2-) - Signed by Truitt Merle, MD on 12/11/2021 Stage prefix:  Initial diagnosis Histologic grading system: 3 grade system    12/06/2021 Initial Biopsy   Diagnosis Breast, right, needle core biopsy, 10  o'clock, 7cmfn INVASIVE DUCTAL CARCINOMA WITH LOBULAR FEATURES, GRADE 2 (3+2+1)  An E-cadherin immunohistochemical stains performed with adequate control. This stain is diffusely negative within the tumor. Histologically the tumor forms ducts and is present in cribriform sheets as well as focally present in cords and small solid nests. Given this histology it is felt the negative E-cadherin stain represents aberrant absence of expression within an otherwise ductal carcinoma. This lack of E-cadherin expression may explain the focal lobular features.  PROGNOSTIC INDICATORS Results: The tumor cells are EQUIVOCAL for Her2 (2+). Her2 by FISH will be performed and the results reported separately. Estrogen Receptor: 95%, POSITIVE, STRONG STAINING INTENSITY Progesterone Receptor: 80%, POSITIVE, STRONG STAINING INTENSITY Proliferation Marker Ki67: 2%   12/10/2021 Initial Diagnosis   Malignant neoplasm of upper-outer quadrant of right breast in female, estrogen receptor positive ([place])      HISTORY OF PRESENTING ILLNESS:  Shannon Liu 81 y.o. female is a here because of breast cancer. The patient [facility]. The patient presents to the clinic today accompanied by her daughter and granddaughter.   She had routine screening mammography on 11/05/21 showing a possible abnormality in the right breast. She underwent right diagnostic mammography and right breast ultrasonography on 11/28/21 showing: 9 mm mass at 10 o'clock; additional benign findings; no lymphadenopathy.  Biopsy on 12/06/21 showed: invasive ductal carcinoma with lobular features. Prognostic indicators significant for: estrogen receptor, 95% positive and progesterone receptor, 80% positive. Proliferation marker Ki67 at 2%. HER2 negative by FISH.   Today the patient notes they felt/feeling prior/after... -no breast concerns   She has a PMHx of.... -benign breast cyst removal at age 42 -HTN -s/p neck surgery  (discectomy with cadaver bone) -s/p hysterectomy -PAF, on Eliquis  Socially... -she lives with her daughter -there is no family history of cancer to her knowledge  GYN HISTORY  Menarchal: 81 years old LMP: early 75's Contraceptive: HRT:  G3P2: had one miscarriage   REVIEW OF SYSTEMS:    Constitutional: Denies fevers, chills or abnormal night sweats Eyes: Denies blurriness of vision, double vision or watery eyes Ears, nose, mouth, throat, and face: Denies mucositis or sore throat Respiratory: Denies cough, dyspnea or wheezes Cardiovascular: Denies palpitation, chest discomfort or lower extremity swelling Gastrointestinal:  Denies nausea, heartburn or change in bowel habits Skin: Denies abnormal skin rashes Lymphatics: Denies new lymphadenopathy or easy bruising Neurological:Denies numbness, tingling or new weaknesses Behavioral/Psych: Mood is stable, no new changes  All other systems were reviewed with the patient and are negative.   MEDICAL HISTORY:  Past Medical History:  Diagnosis Date   Arthritis    Heart murmur    HOH (hard of hearing)    HTN (hypertension)    since pregnant at age 75   Hyperlipemia    Hypertension    PAF (paroxysmal atrial fibrillation) ([place])    a. dx on 06/2015 admission. spontaneously converted into NSR. placed on Eliquis   Sleep apnea     SURGICAL HISTORY: Past Surgical History:  Procedure Laterality Date   ABDOMINAL HYSTERECTOMY     BREAST CYST EXCISION Left    BREAST SURGERY Left    lumpectomy 35 yrs ago   NECK SURGERY     discectomy with cadaver bone    SOCIAL HISTORY: Social History   Socioeconomic History   Marital status: Widowed  Spouse name: Not on file   Number of children: Not on file   Years of education: Not on file   Highest education level: Not on file  Occupational History   Occupation: Retired  Tobacco Use   Smoking status: Never   Smokeless tobacco: Never  Vaping Use   Vaping Use: Never used  Substance  and Sexual Activity   Alcohol use: No    Alcohol/week: 0.0 standard drinks   Drug use: No   Sexual activity: Not on file  Other Topics Concern   Not on file  [place], near [place]. Daughter currently lives with her.   Social Determinants of Health   Financial Resource Strain: Not on file  Food Insecurity: Not on file  Transportation Needs: Not on file  Physical Activity: Not on file  Stress: Not on file  Social Connections: Not on file  Intimate Partner Violence: Not on file    FAMILY HISTORY: Family History  Problem Relation Age of Onset   Heart attack Mother 55   CVA Mother 48   Unexplained death Father 62   Hypertension Sister    Hypertension Brother    Colon cancer Maternal Grandfather    Breast cancer Neg Hx     ALLERGIES:  is allergic to morphine and related and penicillins.  MEDICATIONS:  Current Outpatient Medications  Medication Sig Dispense Refill   acetaminophen (TYLENOL) 500 MG tablet Take 500 mg by mouth every 6 (six) hours as needed for mild pain.     ALPRAZolam (XANAX) 0.25 MG tablet Take 0.25 mg by mouth at bedtime as needed for anxiety.     chlorthalidone (HYGROTON) 25 MG tablet Take 25 mg by mouth daily.     Cholecalciferol (VITAMIN D-3) 125 MCG (5000 UT) TABS Take 1,000 mg by mouth.     diltiazem (CARDIZEM CD) 120 MG 24 hr capsule Take 1 capsule (120 mg total) by mouth 2 (two) times daily. Please make yearly appt with Dr. Marlou Porch for May 2023 for future refills. Thank you 1st attempt 180 capsule 0   diltiazem (CARDIZEM) 30 MG tablet Take 1 tablet every 4 hours AS NEEDED for AFIB heart rate >100 45 tablet 1   ELIQUIS 5 MG TABS tablet TAKE ONE TABLET BY MOUTH TWICE DAILY 180 tablet 1   Multiple Vitamins-Minerals (MULTIVITAMIN WITH MINERALS) tablet Take 1 tablet by mouth daily.     potassium chloride (K-DUR) 10 MEQ tablet Take 10 mEq by mouth daily. with food     Propylene Glycol 0.6 % SOLN Place 1 drop  into both eyes at bedtime.      rosuvastatin (CRESTOR) 10 MG tablet Take 10 mg by mouth daily.     telmisartan (MICARDIS) 80 MG tablet Take 80 mg by mouth daily.     No current facility-administered medications for this visit.    PHYSICAL EXAMINATION: ECOG PERFORMANCE STATUS: 1 - Symptomatic but completely ambulatory  Vitals:   12/12/21 1236  BP: (!) 171/80  Pulse: 87  Resp: 18  Temp: 97.9 F (36.6 C)  SpO2: 97%   Filed Weights   12/12/21 1236  Weight: 212 lb 12.8 oz (96.5 kg)    GENERAL:alert, no distress and comfortable SKIN: skin color, texture, turgor are normal, no rashes or significant lesions EYES: normal, Conjunctiva are pink and non-injected, sclera clear  NECK: supple, thyroid normal size, non-tender, without nodularity LYMPH:  no palpable lymphadenopathy in the cervical, axillary  ABDOMEN:abdomen soft, non-tender and normal  bowel sounds Musculoskeletal:no cyanosis of digits and no clubbing  NEURO: alert & oriented x 3 with fluent speech, no focal motor/sensory deficits BREAST: No palpable mass, nodules or adenopathy bilaterally. Breast exam benign.  LABORATORY DATA:  I have reviewed the data as listed CBC Latest Ref Rng & Units 12/12/2021 03/02/2021 04/21/2017  WBC 4.0 - 10.5 K/uL 8.7 7.4 8.1  Hemoglobin 12.0 - 15.0 g/dL 13.1 14.1 14.4  Hematocrit 36.0 - 46.0 % 38.3 41.0 43.0  Platelets 150 - 400 K/uL 254 233 275    CMP Latest Ref Rng & Units 12/12/2021 03/02/2021 06/14/2019  Glucose 70 - 99 mg/dL 109(H) 99 109(H)  BUN 8 - 23 mg/dL _0 Creatinine 0.44 - 1.00 mg/dL 0.76 0.75 0.80  Sodium 135 - 145 mmol/L 141 144 140  Potassium 3.5 - 5.1 mmol/L 3.4(L) 3.8 4.2  Chloride 98 - 111 mmol/L 103 102 103  CO2 22 - 32 mmol/L _1 Calcium 8.9 - 10.3 mg/dL 9.8 9.9 9.7  Total Protein 6.5 - 8.1 g/dL 6.9 - -  Total Bilirubin 0.3 - 1.2 mg/dL 0.7 - -  Alkaline Phos 38 - 126 U/L 141(H) - -  AST 15 - 41 U/L 17 - -  ALT 0 - 44 U/L 14 - -     RADIOGRAPHIC  STUDIES: I have personally reviewed the radiological images as listed and agreed with the findings in the report. US BREAST LTD UNI RIGHT INC AXILLA  Result Date: 11/28/2021 CLINICAL DATA:  Patient returns today to evaluate possible RIGHT breast asymmetries identified on recent screening mammogram. EXAM: DIGITAL DIAGNOSTIC UNILATERAL RIGHT MAMMOGRAM WITH TOMOSYNTHESIS AND CAD; ULTRASOUND RIGHT BREAST LIMITED TECHNIQUE: Right digital diagnostic mammography and breast tomosynthesis was performed. The images were evaluated with computer-aided detection.; Targeted ultrasound examination of the right breast was performed COMPARISON:  Previous exams including recent screening mammogram dated 11/05/2021 ACR Breast Density Category b: There are scattered areas of fibroglandular density. FINDINGS: On today's additional diagnostic views, an irregular mass is confirmed within the upper-outer quadrant of the RIGHT breast, with associated architectural distortion, measuring approximately 8 mm greatest dimension. There is an additional partially obscured low-density mass within the lower inner quadrant of the RIGHT breast, measuring approximately 5 mm. Targeted ultrasound is performed, showing an irregular hypoechoic mass in the RIGHT breast at the 10 o'clock axis, 7 cm from the nipple, measuring 9 x 6 x 9 mm, with associated architectural distortion, corresponding to the suspicious mammographic finding. Adjacent to the mass is a benign dystrophic calcification at the 10 o'clock axis, 6 cm from the nipple, measuring 3 mm, corresponding as an incidental finding. Several benign cysts are seen within the inner and lower RIGHT breast, 4-6 o'clock axes, largest measuring 9 mm, corresponding to the additional mammographic asymmetry. RIGHT axilla was evaluated with ultrasound showing no enlarged or morphologically abnormal lymph nodes. IMPRESSION: 1. Irregular hypoechoic mass in the RIGHT breast at the 10 o'clock axis, 7 cm from the  nipple, measuring 9 mm, with associated architectural distortion, corresponding to the mammographic finding. This is a highly suspicious finding for which ultrasound-guided core biopsy is recommended. 2. Additional benign findings within the RIGHT breast, as detailed above. 3. No enlarged or morphologically abnormal lymph nodes in the RIGHT axilla. RECOMMENDATION: Ultrasound-guided core biopsy for the irregular hypoechoic mass in the RIGHT breast at the 10 o'clock axis, 7 cm from the nipple, measuring 9 mm, with associated architectural distortion. Ultrasound-guided biopsy is scheduled on February 23rd. I have discussed  the findings and recommendations with the patient. If applicable, a reminder letter will be sent to the patient regarding the next appointment. BI-RADS CATEGORY  5: Highly suggestive of malignancy. Electronically Signed   By: Franki Cabot M.D.   On: 11/28/2021 11:38  MM DIAG BREAST TOMO UNI RIGHT  Result Date: 11/28/2021 CLINICAL DATA:  Patient returns today to evaluate possible RIGHT breast asymmetries identified on recent screening mammogram. EXAM: DIGITAL DIAGNOSTIC UNILATERAL RIGHT MAMMOGRAM WITH TOMOSYNTHESIS AND CAD; ULTRASOUND RIGHT BREAST LIMITED TECHNIQUE: Right digital diagnostic mammography and breast tomosynthesis was performed. The images were evaluated with computer-aided detection.; Targeted ultrasound examination of the right breast was performed COMPARISON:  Previous exams including recent screening mammogram dated 11/05/2021 ACR Breast Density Category b: There are scattered areas of fibroglandular density. FINDINGS: On today's additional diagnostic views, an irregular mass is confirmed within the upper-outer quadrant of the RIGHT breast, with associated architectural distortion, measuring approximately 8 mm greatest dimension. There is an additional partially obscured low-density mass within the lower inner quadrant of the RIGHT breast, measuring approximately 5 mm. Targeted  ultrasound is performed, showing an irregular hypoechoic mass in the RIGHT breast at the 10 o'clock axis, 7 cm from the nipple, measuring 9 x 6 x 9 mm, with associated architectural distortion, corresponding to the suspicious mammographic finding. Adjacent to the mass is a benign dystrophic calcification at the 10 o'clock axis, 6 cm from the nipple, measuring 3 mm, corresponding as an incidental finding. Several benign cysts are seen within the inner and lower RIGHT breast, 4-6 o'clock axes, largest measuring 9 mm, corresponding to the additional mammographic asymmetry. RIGHT axilla was evaluated with ultrasound showing no enlarged or morphologically abnormal lymph nodes. IMPRESSION: 1. Irregular hypoechoic mass in the RIGHT breast at the 10 o'clock axis, 7 cm from the nipple, measuring 9 mm, with associated architectural distortion, corresponding to the mammographic finding. This is a highly suspicious finding for which ultrasound-guided core biopsy is recommended. 2. Additional benign findings within the RIGHT breast, as detailed above. 3. No enlarged or morphologically abnormal lymph nodes in the RIGHT axilla. RECOMMENDATION: Ultrasound-guided core biopsy for the irregular hypoechoic mass in the RIGHT breast at the 10 o'clock axis, 7 cm from the nipple, measuring 9 mm, with associated architectural distortion. Ultrasound-guided biopsy is scheduled on February 23rd. I have discussed the findings and recommendations with the patient. If applicable, a reminder letter will be sent to the patient regarding the next appointment. BI-RADS CATEGORY  5: Highly suggestive of malignancy. Electronically Signed   By: Franki Cabot M.D.   On: 11/28/2021 11:38  MM CLIP PLACEMENT RIGHT  Result Date: 12/06/2021 CLINICAL DATA:  Patient status post ultrasound-guided biopsy right breast mass. EXAM: 3D DIAGNOSTIC RIGHT MAMMOGRAM POST ULTRASOUND BIOPSY COMPARISON:  Previous exam(s). FINDINGS: 3D Mammographic images were obtained  following ultrasound guided biopsy of right breast mass. The biopsy marking clip is in expected position at the site of biopsy. IMPRESSION: Appropriate positioning of the ribbon shaped biopsy marking clip at the site of biopsy in the upper-outer right breast. Final Assessment: Post Procedure Mammograms for Marker Placement Electronically Signed   By: Lovey Newcomer M.D.   On: 12/06/2021 09:40  [place] RT BREAST BX W LOC DEV 1ST LESION IMG BX SPEC US GUIDE  Addendum Date: 12/08/2021   ADDENDUM REPORT: 12/08/2021 09:03 ADDENDUM: Pathology revealed GRADE II INVASIVE DUCTAL CARCINOMA WITH LOBULAR FEATURES of the RIGHT breast, 10 o'clock, 7cmfn, (ribbon clip). This was found to be concordant by Dr. Lovey Newcomer. Pathology  results were discussed with the patient by telephone. The patient reported doing well after the biopsy with tenderness at the site. Post biopsy instructions and care were reviewed and questions were answered. The patient was encouraged to call [facility] for any additional concerns. My direct phone number was provided. The patient was referred to [facility] on December 12, 2021. Pathology results reported by Terie Purser, RN on 12/07/2021. Electronically Signed   By: Lovey Newcomer M.D.   On: 12/08/2021 09:03   Result Date: 12/08/2021 CLINICAL DATA:  Patient with indeterminate right breast mass 10 o'clock position EXAM: ULTRASOUND GUIDED RIGHT BREAST CORE NEEDLE BIOPSY COMPARISON:  Previous exam(s). PROCEDURE: I met with the patient and we discussed the procedure of ultrasound-guided biopsy, including benefits and alternatives. We discussed the high likelihood of a successful procedure. We discussed the risks of the procedure, including infection, bleeding, tissue injury, clip migration, and inadequate sampling. Informed written consent was given. The usual time-out protocol was performed immediately prior to  the procedure. Lesion quadrant: Upper outer quadrant Using sterile technique and 1% Lidocaine as local anesthetic, under direct ultrasound visualization, a 14 gauge spring-loaded device was used to perform biopsy of right breast mass 10 o'clock position using a lateral approach. At the conclusion of the procedure ribbon tissue marker clip was deployed into the biopsy cavity. Follow up 2 view mammogram was performed and dictated separately. IMPRESSION: Ultrasound guided biopsy of right breast mass 10 o'clock position. No apparent complications. Electronically Signed: By: Lovey Newcomer M.D. On: 12/06/2021 09:40    No orders of the defined types were placed in this encounter.   All questions were answered. The patient knows to call the clinic with any problems, questions or concerns. The total time spent in the appointment was 45 minutes.     Truitt Merle, MD 12/12/2021   I, Wilburn Mylar, am acting as scribe for Truitt Merle, MD.   I have reviewed the above documentation for accuracy and completeness, and I agree with the above.

## 2021-12-12 NOTE — Research (Signed)
[employer] 2021-05 - Specimen Collection Study to Evaluate Biomarkers in Subjects with Cancer  ?Patient Shannon Liu was identified by Dr Burr Medico as a potential candidate for the above listed study.  This Clinical Research Nurse met with Shannon Liu, [id number], on 12/12/21 in a manner and location that ensures patient privacy to discuss participation in the above listed research study.  Patient is Accompanied by her daughter and grandaughter .  A copy of the informed consent document with embedded HIPAA language was provided to the patient.  Patient reads, speaks, and understands [place].   ?Patient was provided with the business card of this Nurse and encouraged to contact the research team with any questions.  Approximately 15 minutes were spent with the patient reviewing the informed consent documents.  Patient was provided the option of taking informed consent documents home to review and was encouraged to review at their convenience with their support network, including other care providers. Patient took the consent documents home to review. ?Patient requested a follow-up call from research mid-morning on Friday, 3/3, to answer questions and discuss study participation. ?Vickii Penna, RN, BSN, CPN ?Clinical Research Nurse I ?[phone] ? ?12/12/2021 3:24 PM ? ? ? ?

## 2021-12-13 ENCOUNTER — Encounter: Payer: Self-pay | Admitting: Genetic Counselor

## 2021-12-13 ENCOUNTER — Telehealth: Payer: Self-pay | Admitting: *Deleted

## 2021-12-13 ENCOUNTER — Other Ambulatory Visit: Payer: Self-pay | Admitting: Surgery

## 2021-12-13 DIAGNOSIS — Z8042 Family history of malignant neoplasm of prostate: Secondary | ICD-10-CM | POA: Insufficient documentation

## 2021-12-13 DIAGNOSIS — Z170 Estrogen receptor positive status [ER+]: Secondary | ICD-10-CM

## 2021-12-13 DIAGNOSIS — C50911 Malignant neoplasm of unspecified site of right female breast: Secondary | ICD-10-CM

## 2021-12-13 NOTE — Telephone Encounter (Signed)
? ?  Pre-operative Risk Assessment  ?  ?Patient Name: Shannon Liu  ?DOB: August 16, 1941 ?MRN: [id number]  ? ?  ? ?Request for Surgical Clearance   ? ?Procedure:   BREAST LUMPECTOMY ? ?Date of Surgery:  Clearance TBD                              ?   ?Surgeon:  DR. Erroll Luna ?Surgeon's Group or Practice Name:  [facility] ?Phone number:  [phone] ?Fax number:  [phone] ATTN: Carlene Coria, CMA ?  ?Type of Clearance Requested:   ?- Medical  ?- Pharmacy:  Hold Apixaban (Eliquis)   ?  ?Type of Anesthesia:  General  ?  ?Additional requests/questions:   ? ?Signed, ?Julaine Hua   ?12/13/2021, 5:11 PM  ? ?

## 2021-12-13 NOTE — Progress Notes (Signed)
REFERRING PROVIDER: Truitt Merle, MD [address] [place], [place] [address]  PRIMARY PROVIDER:  Lavone Orn, MD  PRIMARY REASON FOR VISIT:  1. Malignant neoplasm of upper-outer quadrant of right breast in female, estrogen receptor positive ([place])   2. Family history of prostate cancer     HISTORY OF PRESENT ILLNESS:   Shannon Liu, a 81 y.o. female, was seen for a [facility] cancer genetics consultation at the request of Dr. Burr Medico due to a personal and family history of cancer.  Shannon Liu presents to clinic today to discuss the possibility of a hereditary predisposition to cancer, to discuss genetic testing, and to further clarify her future cancer risks, as well as potential cancer risks for family members.   In February 2023, at the age of 53, Shannon Liu was diagnosed with invasive ductal carcinoma of the right breast.   CANCER HISTORY:  Oncology History Overview Note   Cancer Staging  Malignant neoplasm of upper-outer quadrant of right breast in female, estrogen receptor positive ([place]) Staging form: Breast, AJCC 8th Edition - Clinical stage from 12/06/2021: Stage IA (cT1b, cN0, cM0, G2, ER+, PR+, HER2-) - Signed by Truitt Merle, MD on 12/11/2021    Malignant neoplasm of upper-outer quadrant of right breast in female, estrogen receptor positive ([place])  11/28/2021 Mammogram   CLINICAL DATA:  Patient returns today to evaluate possible RIGHT breast asymmetries identified on recent screening mammogram.   EXAM: DIGITAL DIAGNOSTIC UNILATERAL RIGHT MAMMOGRAM WITH TOMOSYNTHESIS AND CAD; ULTRASOUND RIGHT BREAST LIMITED  IMPRESSION: 1. Irregular hypoechoic mass in the RIGHT breast at the 10 o'clock axis, 7 cm from the nipple, measuring 9 mm, with associated architectural distortion, corresponding to the mammographic finding. This is a highly suspicious finding for which ultrasound-guided core biopsy is recommended. 2. Additional benign findings within the RIGHT breast, as detailed above. 3.  No enlarged or morphologically abnormal lymph nodes in the RIGHT axilla.   12/06/2021 Cancer Staging   Staging form: Breast, AJCC 8th Edition - Clinical stage from 12/06/2021: Stage IA (cT1b, cN0, cM0, G2, ER+, PR+, HER2-) - Signed by Truitt Merle, MD on 12/11/2021 Stage prefix: Initial diagnosis Histologic grading system: 3 grade system    12/06/2021 Initial Biopsy   Diagnosis Breast, right, needle core biopsy, 10 o'clock, 7cmfn INVASIVE DUCTAL CARCINOMA WITH LOBULAR FEATURES, GRADE 2 (3+2+1)  An E-cadherin immunohistochemical stains performed with adequate control. This stain is diffusely negative within the tumor. Histologically the tumor forms ducts and is present in cribriform sheets as well as focally present in cords and small solid nests. Given this histology it is felt the negative E-cadherin stain represents aberrant absence of expression within an otherwise ductal carcinoma. This lack of E-cadherin expression may explain the focal lobular features.  PROGNOSTIC INDICATORS Results: The tumor cells are EQUIVOCAL for Her2 (2+). Her2 by FISH will be performed and the results reported separately. Estrogen Receptor: 95%, POSITIVE, STRONG STAINING INTENSITY Progesterone Receptor: 80%, POSITIVE, STRONG STAINING INTENSITY Proliferation Marker Ki67: 2%   12/10/2021 Initial Diagnosis   Malignant neoplasm of upper-outer quadrant of right breast in female, estrogen receptor positive ([place])      RISK FACTORS:  Menarche was at age 11.  First live birth at age 32.  OCP use for approximately 1 year.  Uterus intact: no.  Menopausal status: postmenopausal.  HRT use: 4-5 years. Colonoscopy: yes; 2020 Mammogram within the last year: yes. Any excessive radiation exposure in the past: no  Past Medical History:  Diagnosis Date   Arthritis  Heart murmur    HOH (hard of hearing)    HTN (hypertension)    since pregnant at age 85   Hyperlipemia    Hypertension    PAF (paroxysmal atrial  fibrillation) ([place])    a. dx on 06/2015 admission. spontaneously converted into NSR. placed on Eliquis   Sleep apnea     Past Surgical History:  Procedure Laterality Date   ABDOMINAL HYSTERECTOMY     BREAST CYST EXCISION Left    BREAST SURGERY Left    lumpectomy 35 yrs ago   NECK SURGERY     discectomy with cadaver bone    Social History   Socioeconomic History   Marital status: Widowed    Spouse name: Not on file   Number of children: Not on file   Years of education: Not on file   Highest education level: Not on file  Occupational History   Occupation: Retired  Tobacco Use   Smoking status: Never   Smokeless tobacco: Never  Vaping Use   Vaping Use: Never used  Substance and Sexual Activity   Alcohol use: No    Alcohol/week: 0.0 standard drinks   Drug use: No   Sexual activity: Not on file  Other Topics Concern   Not on file  [place], near [place]. Daughter currently lives with her.   Social Determinants of Health   Financial Resource Strain: Not on file  Food Insecurity: Not on file  Transportation Needs: Not on file  Physical Activity: Not on file  Stress: Not on file  Social Connections: Not on file     FAMILY HISTORY:  We obtained a detailed, 4-generation family history.  Significant diagnoses are listed below: Family History  Problem Relation Age of Onset   Heart attack Mother 72   CVA Mother 87   Unexplained death Father 17   Hypertension Sister    Hypertension Brother    Colon cancer Maternal Grandfather    Prostate cancer Son 84   Breast cancer Neg Hx      Shannon Liu's son was diagnosed with high risk prostate cancer at age 59, he is currently 60. Her maternal grandfather was diagnosed with colon cancer at an unknown age, he is deceased. Shannon Liu is unaware of previous family history of genetic testing for hereditary cancer risks. There is no reported Ashkenazi Jewish ancestry.   GENETIC  COUNSELING ASSESSMENT: Shannon Liu is a 81 y.o. female with a personal and family history of cancer which is somewhat suggestive of a hereditary predisposition to cancer. We, therefore, discussed and recommended the following at today's visit.   DISCUSSION: We discussed that 5 - 10% of cancer is hereditary, with most cases of breast cancer associated with BRCA1/2.  There are other genes that can be associated with hereditary breast cancer syndromes.  We discussed that testing is beneficial for several reasons including knowing how to follow individuals after completing their treatment, identifying whether potential treatment options would be beneficial, and understanding if other family members could be at risk for cancer and allowing them to undergo genetic testing.   We reviewed the characteristics, features and inheritance patterns of hereditary cancer syndromes. We also discussed genetic testing, including the appropriate family members to test, the process of testing, insurance coverage and turn-around-time for results. We discussed the implications of a negative, positive, carrier and/or variant of uncertain significant result. We recommended Ms. Heyboer pursue genetic testing for a panel that includes genes  associated with breast, prostate, and colon cancer.   Ms. Dusek  was offered a common hereditary cancer panel (47 genes) and an expanded pan-cancer panel (77 genes). Ms. Miyoshi was informed of the benefits and limitations of each panel, including that expanded pan-cancer panels contain genes that do not have clear management guidelines at this point in time.  We also discussed that as the number of genes included on a panel increases, the chances of variants of uncertain significance increases.  After considering the benefits and limitations of each gene panel, Ms. Suen  elected to have [facility] CancerNext-Expanded Panel+RNA.  The CancerNext-Expanded gene panel offered by [facility] and includes  sequencing, rearrangement, and RNA analysis for the following 77 genes: AIP, ALK, APC, ATM, AXIN2, BAP1, BARD1, BLM, BMPR1A, BRCA1, BRCA2, BRIP1, CDC73, CDH1, CDK4, CDKN1B, CDKN2A, CHEK2, CTNNA1, DICER1, FANCC, FH, FLCN, GALNT12, KIF1B, LZTR1, MAX, MEN1, MET, MLH1, MSH2, MSH3, MSH6, MUTYH, NBN, NF1, NF2, NTHL1, PALB2, PHOX2B, PMS2, POT1, PRKAR1A, PTCH1, PTEN, RAD51C, RAD51D, RB1, RECQL, RET, SDHA, SDHAF2, SDHB, SDHC, SDHD, SMAD4, SMARCA4, SMARCB1, SMARCE1, STK11, SUFU, TMEM127, TP53, TSC1, TSC2, VHL and XRCC2 (sequencing and deletion/duplication); EGFR, EGLN1, HOXB13, KIT, MITF, PDGFRA, POLD1, and POLE (sequencing only); EPCAM and GREM1 (deletion/duplication only).   Based on Ms. Purnell's personal and family history of cancer, she meets medical criteria for genetic testing. Despite that she meets criteria, she may still have an out of pocket cost. We discussed that if her out of pocket cost for testing is over $100, the laboratory will call and confirm whether she wants to proceed with testing.  If the out of pocket cost of testing is less than $100 she will be billed by the genetic testing laboratory.   PLAN: After considering the risks, benefits, and limitations, Ms. Yankowski provided informed consent to pursue genetic testing and the blood sample was sent to [facility] for analysis of the CancerNext-Expanded Panel. Results should be available within approximately 2-3 weeks' time, at which point they will be disclosed by telephone to Ms. Aho, as will any additional recommendations warranted by these results. Ms. Riso will receive a summary of her genetic counseling visit and a copy of her results once available. This information will also be available in Epic.   Ms. Levenhagen questions were answered to her satisfaction today. Our contact information was provided should additional questions or concerns arise. Thank you for the referral and allowing [place] to share in the care of your patient.   Lucille Passy, MS, [facility] Genetic Counselor [email or web address] (P) [phone]  The patient was seen for a total of 20 minutes in face-to-face genetic counseling.  The patient brought her daughter and granddaughter. Drs. Lindi Adie and/or Burr Medico were available to discuss this case as needed.   _______________________________________________________________________ For Office Staff:  Number of people involved in session: 3 Was an Intern/ student involved with case: no

## 2021-12-14 ENCOUNTER — Telehealth: Payer: Self-pay | Admitting: Hematology

## 2021-12-14 ENCOUNTER — Telehealth: Payer: Self-pay

## 2021-12-14 DIAGNOSIS — Z170 Estrogen receptor positive status [ER+]: Secondary | ICD-10-CM

## 2021-12-14 NOTE — Telephone Encounter (Signed)
Patient with diagnosis of afib on Eliquis for anticoagulation.   ? ?Procedure: breast lumpectomy ?Date of procedure: 12/26/21 ? ?CHA2DS2-VASc Score = 4  ?This indicates a 4.8% annual risk of stroke. ?The patient's score is based upon: ?CHF History: 0 ?HTN History: 1 ?Diabetes History: 0 ?Stroke History: 0 ?Vascular Disease History: 0 ?Age Score: 2 ?Gender Score: 1 ?  ?CrCl 37mL/min using adjusted body weight due to obesity ?Platelet count 254K ? ?Per office protocol, patient can hold Eliquis for 2 days prior to procedure.   ?

## 2021-12-14 NOTE — Telephone Encounter (Signed)
[employer] 2021-05 - Specimen Collection Study to Evaluate Biomarkers in Subjects with Cancer  ? ?Called to follow-up with patient on interest in participation in above-listed study. Patient stated that she would like to participate and that her surgery was scheduled for 3/15. Patient agreed to come in on Wednesday, 3/8, at 10am for consent and labs.  ? ?Vickii Penna, RN, BSN, CPN ?Clinical Research Nurse I ?[phone] ? ?12/14/2021 11:06 AM ? ?

## 2021-12-14 NOTE — Telephone Encounter (Signed)
.  Called patient to schedule appointment per 3/2 inbasket, patient is aware of date and time.   ?

## 2021-12-17 ENCOUNTER — Encounter (HOSPITAL_BASED_OUTPATIENT_CLINIC_OR_DEPARTMENT_OTHER): Payer: Self-pay | Admitting: Surgery

## 2021-12-17 NOTE — Telephone Encounter (Signed)
LMTCB [id number] on 12/17/21. Pt needs call back  ?

## 2021-12-18 NOTE — Telephone Encounter (Signed)
Patient is returning call.  °

## 2021-12-18 NOTE — Telephone Encounter (Signed)
?  ? ?  Primary Cardiologist: Candee Furbish, MD ? ?Chart reviewed as part of pre-operative protocol coverage. Given past medical history and time since last visit, based on ACC/AHA guidelines, Shannon Liu would be at acceptable risk for the planned procedure without further cardiovascular testing.  ? ?Patient with diagnosis of afib on Eliquis for anticoagulation.   ?  ?Procedure: breast lumpectomy ?Date of procedure: 12/26/21 ?  ?CHA2DS2-VASc Score = 4  ?This indicates a 4.8% annual risk of stroke. ?The patient's score is based upon: ?CHF History: 0 ?HTN History: 1 ?Diabetes History: 0 ?Stroke History: 0 ?Vascular Disease History: 0 ?Age Score: 2 ?Gender Score: 1 ?  ?CrCl 74m/min using adjusted body weight due to obesity ?Platelet count 254K ?  ?Per office protocol, patient can hold Eliquis for 2 days prior to procedure. ? ?Patient was advised that if she develops new symptoms prior to surgery to contact our office to arrange a follow-up appointment.  She verbalized understanding. ? ?I will route this recommendation to the requesting party via Epic fax function and remove from pre-op pool. ? ?Please call with questions. ? ? ? ?JJossie Ng Mazie Fencl NP-C ? ?  ?12/18/2021, 9:58 AM ?[place]?[id number] ?Office [phone] ([phone]? ?

## 2021-12-19 ENCOUNTER — Inpatient Hospital Stay: Payer: Medicare PPO

## 2021-12-19 ENCOUNTER — Other Ambulatory Visit: Payer: Self-pay

## 2021-12-19 ENCOUNTER — Encounter: Payer: Self-pay | Admitting: General Practice

## 2021-12-19 DIAGNOSIS — C50411 Malignant neoplasm of upper-outer quadrant of right female breast: Secondary | ICD-10-CM

## 2021-12-19 DIAGNOSIS — Z170 Estrogen receptor positive status [ER+]: Secondary | ICD-10-CM

## 2021-12-19 LAB — RESEARCH LABS

## 2021-12-19 NOTE — Research (Signed)
[employer] 2021-05 - Specimen Collection Study to Evaluate Biomarkers in Subjects with Cancer  ? ?12/19/21 ? ?This Coordinator has reviewed this patient's inclusion and exclusion criteria and confirmed Shannon Liu is eligible for study participation.  Patient will continue with enrollment. ? ?Clabe Seal ?Clinical Research Coordinator I  ?12/19/21 10:07 AM ? ?

## 2021-12-19 NOTE — Progress Notes (Signed)
[facility] Psychosocial Distress Screening ?Spiritual Care ? ?Met with Shannon Liu, her daughter, and granddaughter in [facility] to introduce [place] team/resources, reviewing distress screen per protocol.  The patient scored a 5 on the Psychosocial Distress Thermometer which indicates moderate distress. Also assessed for distress and other psychosocial needs.  ? ?ONCBCN DISTRESS SCREENING 12/19/2021  ?Screening Type Initial Screening  ?Distress experienced in past week (1-10) 5  ?Referral to support programs Yes  ? ?Shannon Liu reports a strong family and church support system. She notes that attending [facility] helped reduce her distress to very low. ? ?Provided empathic listening, normalization of feelings, and introduction to [facility] team and programming resources. ? ?Follow up needed: No. Shannon Liu plans to call chaplain as needed/desired. ? ? ?Chaplain Lorrin Jackson, MDiv, [facility] ?Pager [phone] ?Voicemail [phone] ? ? ? ? ?  ?

## 2021-12-19 NOTE — Research (Signed)
[employer] 2021-05 - Specimen Collection Study to Evaluate Biomarkers in Subjects with Cancer  ? ?Patient Shannon Liu was identified by Dr Burr Medico as a potential candidate for the above listed study. This Clinical Research Nurse met with [name], [id number] on 12/19/21 in a manner and location that ensures patient privacy to discuss participation in the above listed research study. Patient is Unaccompanied. Patient was previously provided with informed consent documents. Patient confirmed they have read the informed consent documents. ? ?As outlined in the informed consent form, this Nurse and Forbes Cellar discussed the purpose of the research study, the investigational nature of the study, study procedures and requirements for study participation, potential risks and benefits of study participation, as well as alternatives to participation. This study is not blinded or double-blinded. The patient understands participation is voluntary and they may withdraw from study participation at any time. This study does not involve randomization. This study does not involve an investigational drug or device. This study does not involve a placebo. Patient understands enrollment is pending full eligibility review.  ? ?Confidentiality and how the patient's information will be used as part of study participation were discussed. Patient was informed there is reimbursement provided for their time and effort spent on trial participation. The patient is encouraged to discuss research study participation with their insurance provider to determine what costs they may incur as part of study participation, including research related injury.   ? ?All questions were answered to patient's satisfaction. The informed consent with embedded HIPAA language was reviewed page by page. The patient's mental and emotional status is appropriate to provide informed consent, and the patient verbalizes an understanding of study  participation. Patient has agreed to participate in the above listed research study and has voluntarily signed the informed consent with embedded HIPAA language, version 14 (revised 11/12/21)  on 12/19/21 at 945AM. The patient was provided with a copy of the signed informed consent form with embedded HIPAA language for their reference. No study specific procedures were obtained prior to the signing of the informed consent document. Approximately 15 minutes were spent with the patient reviewing the informed consent documents.Patient was not requested to complete a Release of Information form. ? ?ELIGIBILITY: This Nurse has reviewed this patient's inclusion and exclusion criteria and confirmed Shannon Liu is eligible for study participation.  Patient will continue with enrollment. ? ?SPECIMEN COLLECTION: Patient's blood specimen was collected at 1008 using fresh venipuncture. Blood kit: [id number]. ? ?TISSUE REQUEST: Tissue request sent for block from patient's biopsy on 12/06/21. ? ?GIFT CARD: The patient was given a $28 [employer] gift card for her participation in the study. ? ?DATA COLLECTION: The patient reports past medical history of hypertension. She denies known past medical history of coronary artery disease, lupus, rheumatoid arthritis, diabetes, or lynch syndrome. The patient is not taking magnesium supplements. The patient does report family history of prostate cancer in her son and colon cancer in her maternal grandfather. She denies any other known family history of cancer in 1st or 2nd degree relatives. The patient reports she has no history of alcohol consumption or tobacco use.  ? ?Vickii Penna, RN, BSN, CPN ?Clinical Research Nurse I ?[phone] ? ?12/19/2021 10:06 AM ? ?

## 2021-12-20 ENCOUNTER — Encounter: Payer: Self-pay | Admitting: *Deleted

## 2021-12-20 ENCOUNTER — Telehealth: Payer: Self-pay | Admitting: *Deleted

## 2021-12-20 NOTE — Telephone Encounter (Signed)
Spoke with patient to follow up from [facility] 3/1 and assess navigation needs. Patient denies any questions or concerns at this time. Encouraged her to call should anything arise. Patient verbalized understanding.  ?

## 2021-12-25 ENCOUNTER — Encounter (HOSPITAL_BASED_OUTPATIENT_CLINIC_OR_DEPARTMENT_OTHER)
Admission: RE | Admit: 2021-12-25 | Discharge: 2021-12-25 | Disposition: A | Discharge status: Home / Self Care | Payer: Medicare PPO | Source: Ambulatory Visit | Attending: Surgery | Admitting: Surgery

## 2021-12-25 ENCOUNTER — Ambulatory Visit
Admission: RE | Admit: 2021-12-25 | Discharge: 2021-12-25 | Disposition: A | Discharge status: Home / Self Care | Payer: Medicare PPO | Source: Ambulatory Visit | Attending: Surgery | Admitting: Surgery

## 2021-12-25 DIAGNOSIS — C50911 Malignant neoplasm of unspecified site of right female breast: Secondary | ICD-10-CM | POA: Diagnosis not present

## 2021-12-25 DIAGNOSIS — Z880 Allergy status to penicillin: Secondary | ICD-10-CM | POA: Diagnosis not present

## 2021-12-25 DIAGNOSIS — I3139 Other pericardial effusion (noninflammatory): Secondary | ICD-10-CM | POA: Diagnosis present

## 2021-12-25 DIAGNOSIS — I493 Ventricular premature depolarization: Secondary | ICD-10-CM | POA: Diagnosis present

## 2021-12-25 DIAGNOSIS — I10 Essential (primary) hypertension: Secondary | ICD-10-CM | POA: Diagnosis present

## 2021-12-25 DIAGNOSIS — Z90710 Acquired absence of both cervix and uterus: Secondary | ICD-10-CM | POA: Diagnosis not present

## 2021-12-25 DIAGNOSIS — M1990 Unspecified osteoarthritis, unspecified site: Secondary | ICD-10-CM | POA: Diagnosis present

## 2021-12-25 DIAGNOSIS — I491 Atrial premature depolarization: Secondary | ICD-10-CM | POA: Diagnosis present

## 2021-12-25 DIAGNOSIS — I110 Hypertensive heart disease with heart failure: Secondary | ICD-10-CM | POA: Diagnosis not present

## 2021-12-25 DIAGNOSIS — I480 Paroxysmal atrial fibrillation: Secondary | ICD-10-CM | POA: Diagnosis present

## 2021-12-25 DIAGNOSIS — G4733 Obstructive sleep apnea (adult) (pediatric): Secondary | ICD-10-CM | POA: Diagnosis not present

## 2021-12-25 DIAGNOSIS — Z7901 Long term (current) use of anticoagulants: Secondary | ICD-10-CM | POA: Diagnosis not present

## 2021-12-25 DIAGNOSIS — Z885 Allergy status to narcotic agent status: Secondary | ICD-10-CM | POA: Diagnosis not present

## 2021-12-25 DIAGNOSIS — Z79899 Other long term (current) drug therapy: Secondary | ICD-10-CM | POA: Diagnosis not present

## 2021-12-25 DIAGNOSIS — I509 Heart failure, unspecified: Secondary | ICD-10-CM | POA: Diagnosis not present

## 2021-12-25 DIAGNOSIS — Z01818 Encounter for other preprocedural examination: Secondary | ICD-10-CM | POA: Insufficient documentation

## 2021-12-25 DIAGNOSIS — I361 Nonrheumatic tricuspid (valve) insufficiency: Secondary | ICD-10-CM | POA: Diagnosis not present

## 2021-12-25 DIAGNOSIS — G4730 Sleep apnea, unspecified: Secondary | ICD-10-CM | POA: Diagnosis present

## 2021-12-25 DIAGNOSIS — I4891 Unspecified atrial fibrillation: Secondary | ICD-10-CM | POA: Diagnosis present

## 2021-12-25 DIAGNOSIS — F419 Anxiety disorder, unspecified: Secondary | ICD-10-CM | POA: Diagnosis present

## 2021-12-25 DIAGNOSIS — Z8249 Family history of ischemic heart disease and other diseases of the circulatory system: Secondary | ICD-10-CM | POA: Diagnosis not present

## 2021-12-25 DIAGNOSIS — I340 Nonrheumatic mitral (valve) insufficiency: Secondary | ICD-10-CM | POA: Diagnosis not present

## 2021-12-25 DIAGNOSIS — E785 Hyperlipidemia, unspecified: Secondary | ICD-10-CM | POA: Diagnosis present

## 2021-12-25 DIAGNOSIS — I429 Cardiomyopathy, unspecified: Secondary | ICD-10-CM | POA: Diagnosis present

## 2021-12-25 DIAGNOSIS — I081 Rheumatic disorders of both mitral and tricuspid valves: Secondary | ICD-10-CM | POA: Diagnosis present

## 2021-12-25 DIAGNOSIS — C50411 Malignant neoplasm of upper-outer quadrant of right female breast: Secondary | ICD-10-CM | POA: Diagnosis present

## 2021-12-25 DIAGNOSIS — H9190 Unspecified hearing loss, unspecified ear: Secondary | ICD-10-CM | POA: Diagnosis present

## 2021-12-25 DIAGNOSIS — I4819 Other persistent atrial fibrillation: Secondary | ICD-10-CM | POA: Diagnosis not present

## 2021-12-25 DIAGNOSIS — E876 Hypokalemia: Secondary | ICD-10-CM | POA: Diagnosis present

## 2021-12-25 DIAGNOSIS — Z170 Estrogen receptor positive status [ER+]: Secondary | ICD-10-CM | POA: Diagnosis not present

## 2021-12-25 LAB — BASIC METABOLIC PANEL
Anion gap: 9 (ref 5–15)
BUN: 22 mg/dL (ref 8–23)
CO2: 29 mmol/L (ref 22–32)
Calcium: 9.4 mg/dL (ref 8.9–10.3)
Chloride: 101 mmol/L (ref 98–111)
Creatinine, Ser: 0.97 mg/dL (ref 0.44–1.00)
GFR, Estimated: 59 mL/min — ABNORMAL LOW (ref >60)
Glucose, Bld: 114 mg/dL — ABNORMAL HIGH (ref 70–99)
Potassium: 3.6 mmol/L (ref 3.5–5.1)
Sodium: 139 mmol/L (ref 135–145)

## 2021-12-25 MED ORDER — CHLORHEXIDINE GLUCONATE CLOTH 2 % EX PADS: 6.0000 | MEDICATED_PAD | Freq: Once | CUTANEOUS | Status: DC

## 2021-12-26 ENCOUNTER — Other Ambulatory Visit: Payer: Self-pay

## 2021-12-26 ENCOUNTER — Ambulatory Visit
Admission: RE | Admit: 2021-12-26 | Discharge: 2021-12-26 | Disposition: A | Discharge status: Home / Self Care | Payer: Medicare PPO | Source: Ambulatory Visit | Attending: Surgery | Admitting: Surgery

## 2021-12-26 ENCOUNTER — Inpatient Hospital Stay
Admission: RE | Admit: 2021-12-26 | Discharge: 2021-12-26 | Disposition: A | Discharge status: Home / Self Care | Payer: Medicare PPO | Source: Ambulatory Visit

## 2021-12-26 ENCOUNTER — Encounter (HOSPITAL_BASED_OUTPATIENT_CLINIC_OR_DEPARTMENT_OTHER): Payer: Self-pay | Admitting: Surgery

## 2021-12-26 ENCOUNTER — Ambulatory Visit (HOSPITAL_BASED_OUTPATIENT_CLINIC_OR_DEPARTMENT_OTHER): Payer: Medicare PPO | Admitting: Anesthesiology

## 2021-12-26 ENCOUNTER — Encounter (HOSPITAL_COMMUNITY)
Admission: RE | Disposition: A | Discharge status: Home / Self Care | Payer: Self-pay | Source: Ambulatory Visit | Attending: Cardiology

## 2021-12-26 ENCOUNTER — Inpatient Hospital Stay (HOSPITAL_BASED_OUTPATIENT_CLINIC_OR_DEPARTMENT_OTHER)
Admission: RE | Admit: 2021-12-26 | Discharge: 2021-12-31 | DRG: 309 | Disposition: A | Discharge status: Home / Self Care | Payer: Medicare PPO | Source: Ambulatory Visit | Attending: Cardiology | Admitting: Cardiology

## 2021-12-26 DIAGNOSIS — I071 Rheumatic tricuspid insufficiency: Secondary | ICD-10-CM

## 2021-12-26 DIAGNOSIS — C50411 Malignant neoplasm of upper-outer quadrant of right female breast: Secondary | ICD-10-CM | POA: Diagnosis present

## 2021-12-26 DIAGNOSIS — I480 Paroxysmal atrial fibrillation: Principal | ICD-10-CM

## 2021-12-26 DIAGNOSIS — H9190 Unspecified hearing loss, unspecified ear: Secondary | ICD-10-CM | POA: Diagnosis present

## 2021-12-26 DIAGNOSIS — I493 Ventricular premature depolarization: Secondary | ICD-10-CM | POA: Diagnosis present

## 2021-12-26 DIAGNOSIS — Z880 Allergy status to penicillin: Secondary | ICD-10-CM

## 2021-12-26 DIAGNOSIS — Z79899 Other long term (current) drug therapy: Secondary | ICD-10-CM

## 2021-12-26 DIAGNOSIS — Z8249 Family history of ischemic heart disease and other diseases of the circulatory system: Secondary | ICD-10-CM

## 2021-12-26 DIAGNOSIS — G4730 Sleep apnea, unspecified: Secondary | ICD-10-CM | POA: Diagnosis present

## 2021-12-26 DIAGNOSIS — Z885 Allergy status to narcotic agent status: Secondary | ICD-10-CM

## 2021-12-26 DIAGNOSIS — I081 Rheumatic disorders of both mitral and tricuspid valves: Secondary | ICD-10-CM | POA: Diagnosis present

## 2021-12-26 DIAGNOSIS — E876 Hypokalemia: Secondary | ICD-10-CM | POA: Diagnosis present

## 2021-12-26 DIAGNOSIS — Z7901 Long term (current) use of anticoagulants: Secondary | ICD-10-CM

## 2021-12-26 DIAGNOSIS — C50919 Malignant neoplasm of unspecified site of unspecified female breast: Secondary | ICD-10-CM

## 2021-12-26 DIAGNOSIS — M1990 Unspecified osteoarthritis, unspecified site: Secondary | ICD-10-CM | POA: Diagnosis present

## 2021-12-26 DIAGNOSIS — I340 Nonrheumatic mitral (valve) insufficiency: Secondary | ICD-10-CM

## 2021-12-26 DIAGNOSIS — I10 Essential (primary) hypertension: Secondary | ICD-10-CM | POA: Diagnosis present

## 2021-12-26 DIAGNOSIS — I519 Heart disease, unspecified: Secondary | ICD-10-CM

## 2021-12-26 DIAGNOSIS — Z90710 Acquired absence of both cervix and uterus: Secondary | ICD-10-CM

## 2021-12-26 DIAGNOSIS — I429 Cardiomyopathy, unspecified: Secondary | ICD-10-CM | POA: Diagnosis present

## 2021-12-26 DIAGNOSIS — F419 Anxiety disorder, unspecified: Secondary | ICD-10-CM | POA: Diagnosis present

## 2021-12-26 DIAGNOSIS — C50911 Malignant neoplasm of unspecified site of right female breast: Secondary | ICD-10-CM

## 2021-12-26 DIAGNOSIS — I491 Atrial premature depolarization: Secondary | ICD-10-CM | POA: Diagnosis present

## 2021-12-26 DIAGNOSIS — Z170 Estrogen receptor positive status [ER+]: Secondary | ICD-10-CM

## 2021-12-26 DIAGNOSIS — I4891 Unspecified atrial fibrillation: Secondary | ICD-10-CM | POA: Diagnosis present

## 2021-12-26 DIAGNOSIS — E785 Hyperlipidemia, unspecified: Secondary | ICD-10-CM | POA: Diagnosis present

## 2021-12-26 DIAGNOSIS — I3139 Other pericardial effusion (noninflammatory): Secondary | ICD-10-CM | POA: Diagnosis present

## 2021-12-26 LAB — BASIC METABOLIC PANEL
Anion gap: 12 (ref 5–15)
BUN: 15 mg/dL (ref 8–23)
CO2: 24 mmol/L (ref 22–32)
Calcium: 8.6 mg/dL — ABNORMAL LOW (ref 8.9–10.3)
Chloride: 102 mmol/L (ref 98–111)
Creatinine, Ser: 0.64 mg/dL (ref 0.44–1.00)
GFR, Estimated: >60 mL/min (ref >60)
Glucose, Bld: 113 mg/dL — ABNORMAL HIGH (ref 70–99)
Potassium: 4 mmol/L (ref 3.5–5.1)
Sodium: 138 mmol/L (ref 135–145)

## 2021-12-26 LAB — CBC
HCT: 39.8 % (ref 36.0–46.0)
Hemoglobin: 13.1 g/dL (ref 12.0–15.0)
MCH: 30.4 pg (ref 26.0–34.0)
MCHC: 32.9 g/dL (ref 30.0–36.0)
MCV: 92.3 fL (ref 80.0–100.0)
Platelets: 205 10*3/uL (ref 150–400)
RBC: 4.31 MIL/uL (ref 3.87–5.11)
RDW: 14.2 % (ref 11.5–15.5)
WBC: 10.5 10*3/uL (ref 4.0–10.5)
nRBC: 0 % (ref 0.0–0.2)

## 2021-12-26 LAB — MAGNESIUM: Magnesium: 1.7 mg/dL (ref 1.7–2.4)

## 2021-12-26 LAB — TSH: TSH: 0.819 u[IU]/mL (ref 0.350–4.500)

## 2021-12-26 SURGERY — CANCELLED PROCEDURE
Anesthesia: General

## 2021-12-26 MED ORDER — FENTANYL CITRATE (PF) 100 MCG/2ML IJ SOLN
INTRAMUSCULAR | Status: AC
  Filled 2021-12-26: qty 2

## 2021-12-26 MED ORDER — ALPRAZOLAM 0.25 MG PO TABS
0.2500 mg | ORAL_TABLET | Freq: Every evening | ORAL | Status: DC | PRN
  Administered 2021-12-26 – 2021-12-30 (×2): 0.25 mg via ORAL
  Filled 2021-12-26 (×3): qty 1

## 2021-12-26 MED ORDER — DILTIAZEM LOAD VIA INFUSION
10.0000 mg | Freq: Once | INTRAVENOUS | Status: AC
  Administered 2021-12-26: 10 mg via INTRAVENOUS
  Filled 2021-12-26: qty 10

## 2021-12-26 MED ORDER — POTASSIUM CHLORIDE ER 10 MEQ PO TBCR
10.0000 meq | EXTENDED_RELEASE_TABLET | Freq: Every day | ORAL | Status: DC
Start: 2021-12-26 — End: 2021-12-31
  Administered 2021-12-26 – 2021-12-31 (×6): 10 meq via ORAL
  Filled 2021-12-26 (×12): qty 1

## 2021-12-26 MED ORDER — ACETAMINOPHEN 500 MG PO TABS: 500.0000 mg | ORAL_TABLET | Freq: Four times a day (QID) | ORAL | Status: DC | PRN

## 2021-12-26 MED ORDER — POTASSIUM CHLORIDE 10 MEQ/100ML IV SOLN: 10.0000 meq | INTRAVENOUS | Status: DC

## 2021-12-26 MED ORDER — ROSUVASTATIN CALCIUM 5 MG PO TABS
10.0000 mg | ORAL_TABLET | Freq: Every day | ORAL | Status: DC
  Administered 2021-12-26 – 2021-12-31 (×6): 10 mg via ORAL
  Filled 2021-12-26 (×6): qty 2

## 2021-12-26 MED ORDER — APIXABAN 5 MG PO TABS
5.0000 mg | ORAL_TABLET | Freq: Two times a day (BID) | ORAL | Status: DC
  Administered 2021-12-26 – 2021-12-31 (×10): 5 mg via ORAL
  Filled 2021-12-26 (×10): qty 1

## 2021-12-26 MED ORDER — LACTATED RINGERS IV SOLN: INTRAVENOUS | Status: DC

## 2021-12-26 MED ORDER — PROPYLENE GLYCOL 0.6 % OP SOLN: 1.0000 [drp] | Freq: Every day | OPHTHALMIC | Status: DC

## 2021-12-26 MED ORDER — METOPROLOL TARTRATE 25 MG PO TABS
25.0000 mg | ORAL_TABLET | Freq: Two times a day (BID) | ORAL | Status: DC
  Administered 2021-12-26 – 2021-12-29 (×6): 25 mg via ORAL
  Filled 2021-12-26 (×6): qty 1

## 2021-12-26 MED ORDER — DILTIAZEM HCL-DEXTROSE 125-5 MG/125ML-% IV SOLN (PREMIX)
5.0000 mg/h | INTRAVENOUS | Status: DC
  Administered 2021-12-26: 5 mg/h via INTRAVENOUS
  Administered 2021-12-27: 10 mg/h via INTRAVENOUS
  Filled 2021-12-26 (×2): qty 125

## 2021-12-26 MED ORDER — POLYVINYL ALCOHOL 1.4 % OP SOLN
1.0000 [drp] | Freq: Every day | OPHTHALMIC | Status: DC
  Administered 2021-12-26 – 2021-12-30 (×5): 1 [drp] via OPHTHALMIC
  Filled 2021-12-26: qty 15

## 2021-12-26 MED ORDER — DILTIAZEM HCL-DEXTROSE 125-5 MG/125ML-% IV SOLN (PREMIX): 5.0000 mg/h | INTRAVENOUS | Status: DC

## 2021-12-26 MED ORDER — METOPROLOL TARTRATE 5 MG/5ML IV SOLN
INTRAVENOUS | Status: AC
  Filled 2021-12-26: qty 5

## 2021-12-26 MED ORDER — FENTANYL CITRATE (PF) 100 MCG/2ML IJ SOLN
INTRAMUSCULAR | Status: DC | PRN
  Administered 2021-12-26 (×2): 50 ug via INTRAVENOUS

## 2021-12-26 MED ORDER — ESMOLOL HCL 100 MG/10ML IV SOLN
INTRAVENOUS | Status: AC
  Filled 2021-12-26: qty 10

## 2021-12-26 MED ORDER — CLINDAMYCIN PHOSPHATE 900 MG/50ML IV SOLN
INTRAVENOUS | Status: AC
  Filled 2021-12-26: qty 50

## 2021-12-26 MED ORDER — HEPARIN (PORCINE) 25000 UT/250ML-% IV SOLN
1150.0000 [IU]/h | INTRAVENOUS | Status: DC
  Administered 2021-12-26: 1150 [IU]/h via INTRAVENOUS
  Filled 2021-12-26: qty 250

## 2021-12-26 MED ORDER — ESMOLOL HCL 100 MG/10ML IV SOLN
INTRAVENOUS | Status: DC | PRN
  Administered 2021-12-26 (×2): 50 mg via INTRAVENOUS

## 2021-12-26 MED ORDER — METOPROLOL TARTRATE 25 MG PO TABS: 25.0000 mg | ORAL_TABLET | Freq: Two times a day (BID) | ORAL | Status: DC

## 2021-12-26 MED ORDER — CLINDAMYCIN PHOSPHATE 900 MG/50ML IV SOLN: 900.0000 mg | INTRAVENOUS | Status: DC

## 2021-12-26 MED ORDER — ONDANSETRON HCL 4 MG/2ML IJ SOLN: 4.0000 mg | Freq: Four times a day (QID) | INTRAMUSCULAR | Status: DC | PRN

## 2021-12-26 MED ORDER — HEPARIN BOLUS VIA INFUSION
4000.0000 [IU] | Freq: Once | INTRAVENOUS | Status: DC
  Filled 2021-12-26: qty 4000

## 2021-12-26 MED ORDER — HEPARIN (PORCINE) 25000 UT/250ML-% IV SOLN: 4000.0000 [IU]/h | INTRAVENOUS | Status: DC

## 2021-12-26 MED ORDER — METOPROLOL TARTRATE 5 MG/5ML IV SOLN
INTRAVENOUS | Status: DC | PRN
  Administered 2021-12-26 (×2): 2.5 mg via INTRAVENOUS

## 2021-12-26 SURGICAL SUPPLY — 52 items
ADH SKN CLS APL DERMABOND .7 (GAUZE/BANDAGES/DRESSINGS)
APL PRP STRL LF DISP 70% ISPRP (MISCELLANEOUS)
APPLIER CLIP 9.375 MED OPEN (MISCELLANEOUS)
APR CLP MED 9.3 20 MLT OPN (MISCELLANEOUS)
BINDER BREAST LRG (GAUZE/BANDAGES/DRESSINGS) IMPLANT
BINDER BREAST MEDIUM (GAUZE/BANDAGES/DRESSINGS) IMPLANT
BINDER BREAST XLRG (GAUZE/BANDAGES/DRESSINGS) IMPLANT
BINDER BREAST XXLRG (GAUZE/BANDAGES/DRESSINGS) IMPLANT
BLADE SURG 15 STRL LF DISP TIS (BLADE) ×1 IMPLANT
BLADE SURG 15 STRL SS (BLADE)
CANISTER SUC SOCK COL 7IN (MISCELLANEOUS) IMPLANT
CANISTER SUCT 1200ML W/VALVE (MISCELLANEOUS) IMPLANT
CHLORAPREP W/TINT 26 (MISCELLANEOUS) ×1 IMPLANT
CLIP APPLIE 9.375 MED OPEN (MISCELLANEOUS) IMPLANT
COVER BACK TABLE 60X90IN (DRAPES) ×1 IMPLANT
COVER MAYO STAND STRL (DRAPES) ×1 IMPLANT
COVER PROBE W GEL 5X96 (DRAPES) ×1 IMPLANT
DERMABOND ADVANCED (GAUZE/BANDAGES/DRESSINGS)
DERMABOND ADVANCED .7 DNX12 (GAUZE/BANDAGES/DRESSINGS) ×1 IMPLANT
DRAPE LAPAROSCOPIC ABDOMINAL (DRAPES) IMPLANT
DRAPE LAPAROTOMY 100X72 PEDS (DRAPES) ×1 IMPLANT
DRAPE UTILITY XL STRL (DRAPES) ×1 IMPLANT
ELECT COATED BLADE 2.86 ST (ELECTRODE) ×1 IMPLANT
ELECT REM PT RETURN 9FT ADLT (ELECTROSURGICAL)
ELECTRODE REM PT RTRN 9FT ADLT (ELECTROSURGICAL) ×1 IMPLANT
GLOVE SRG 8 PF TXTR STRL LF DI (GLOVE) ×1 IMPLANT
GLOVE SURG LTX SZ8 (GLOVE) ×1 IMPLANT
GLOVE SURG UNDER POLY LF SZ7 (GLOVE) IMPLANT
GLOVE SURG UNDER POLY LF SZ8 (GLOVE)
GOWN STRL REUS W/ TWL LRG LVL3 (GOWN DISPOSABLE) ×2 IMPLANT
GOWN STRL REUS W/ TWL XL LVL3 (GOWN DISPOSABLE) ×1 IMPLANT
GOWN STRL REUS W/TWL LRG LVL3 (GOWN DISPOSABLE)
GOWN STRL REUS W/TWL XL LVL3 (GOWN DISPOSABLE)
HEMOSTAT ARISTA ABSORB 3G PWDR (HEMOSTASIS) IMPLANT
HEMOSTAT SNOW SURGICEL 2X4 (HEMOSTASIS) IMPLANT
KIT MARKER MARGIN INK (KITS) ×1 IMPLANT
NDL HYPO 25X1 1.5 SAFETY (NEEDLE) ×1 IMPLANT
NEEDLE HYPO 25X1 1.5 SAFETY (NEEDLE) IMPLANT
NS IRRIG 1000ML POUR BTL (IV SOLUTION) ×1 IMPLANT
PACK BASIN DAY SURGERY FS (CUSTOM PROCEDURE TRAY) ×1 IMPLANT
PENCIL SMOKE EVACUATOR (MISCELLANEOUS) ×1 IMPLANT
SLEEVE SCD COMPRESS KNEE MED (STOCKING) ×3 IMPLANT
SPIKE FLUID TRANSFER (MISCELLANEOUS) IMPLANT
SPONGE T-LAP 4X18 ~~LOC~~+RFID (SPONGE) ×1 IMPLANT
SUT MNCRL AB 4-0 PS2 18 (SUTURE) ×1 IMPLANT
SUT SILK 2 0 SH (SUTURE) IMPLANT
SUT VICRYL 3-0 CR8 SH (SUTURE) ×1 IMPLANT
SYR CONTROL 10ML LL (SYRINGE) ×1 IMPLANT
TOWEL GREEN STERILE FF (TOWEL DISPOSABLE) ×1 IMPLANT
TRAY FAXITRON CT DISP (TRAY / TRAY PROCEDURE) ×1 IMPLANT
TUBE CONNECTING 20X1/4 (TUBING) IMPLANT
YANKAUER SUCT BULB TIP NO VENT (SUCTIONS) IMPLANT

## 2021-12-26 NOTE — H&P (Signed)
Shannon Liu is a 81 y.o. female who is seen today as an office consultation for evaluation of Breast Cancer ?.  ? ?Patient presents CBC for evaluation of right breast cancer. She was found to have a area of architectural distortion right breast upper outer quadrant measuring 8 mm. Core biopsy showed right breast ductal carcinoma with lobular features ER positive PR positive HER2 negative. No history of breast pain, breast mass or nipple discharge. ? ?Review of Systems: ?A complete review of systems was obtained from the patient. I have reviewed this information and discussed as appropriate with the patient. See HPI as well for other ROS. ? ? ? ?Medical History: ?Past Medical History:  ?Diagnosis Date  ? Anxiety  ? Arrhythmia  ? Hyperlipidemia  ? Hypertension  ? Sleep apnea  ? ?Patient Active Problem List  ?Diagnosis  ? Malignant neoplasm of upper-outer quadrant of right breast in female, estrogen receptor positive (CMS-HCC)  ? ?Past Surgical History:  ?Procedure Laterality Date  ? HYSTERECTOMY N/A  ?Date unknown  ? Neck Surgery N/A  ?Date Unknown  ? ? ?Allergies  ?Allergen Reactions  ? Morphine Nausea And Vomiting  ? Penicillins Nausea And Vomiting and Other (See Comments)  ? ?Current Outpatient Medications on File Prior to Visit  ?Medication Sig Dispense Refill  ? ALPRAZolam (XANAX) 0.25 MG tablet one tablet as needed  ? atorvastatin (LIPITOR) 40 MG tablet Take 1 tablet by mouth once daily  ? diltiazem (CARDIZEM CD) 120 MG XR capsule Take by mouth  ? acetaminophen (TYLENOL) 500 MG tablet Take by mouth  ? chlorthalidone 25 MG tablet TAKE ONE TABLET BY MOUTH EVERY MORNING WITH FOOD  ? diltiazem (CARDIZEM) 30 MG tablet Take 30 mg by mouth once daily as needed  ? ELIQUIS 5 mg tablet Take 5 mg by mouth 2 (two) times daily  ? multivitamin with minerals tablet Take 1 tablet by mouth once daily  ? potassium chloride (KLOR-CON) 10 mEq ER tablet Take 1 tablet by mouth once daily  ? propylene glycoL (SYSTANE BALANCE) 0.6 %  ophthalmic drops Apply 1 drop to eye at bedtime  ? telmisartan (MICARDIS) 80 MG tablet Take 1 tablet by mouth once daily  ? ?No current facility-administered medications on file prior to visit.  ? ?Family History  ?Problem Relation Age of Onset  ? Skin cancer Mother  ? High blood pressure (Hypertension) Mother  ? High blood pressure (Hypertension) Father  ? Coronary Artery Disease (Blocked arteries around heart) Father  ? High blood pressure (Hypertension) Sister  ? Hyperlipidemia (Elevated cholesterol) Sister  ? Diabetes Sister  ? High blood pressure (Hypertension) Brother  ? Diabetes Brother  ? Colon cancer Maternal Grandmother  ? ? ?Social History  ? ?Tobacco Use  ?Smoking Status Never  ?Smokeless Tobacco Never  ? ? ?Social History  ? ?Socioeconomic History  ? Marital status: Married  ?Tobacco Use  ? Smoking status: Never  ? Smokeless tobacco: Never  ?Vaping Use  ? Vaping Use: Never used  ?Substance and Sexual Activity  ? Alcohol use: Never  ? Drug use: Never  ? ?Objective:  ?There were no vitals filed for this visit.  ?There is no height or weight on file to calculate BMI. ? ?Physical Exam ?Constitutional:  ?Appearance: Normal appearance.  ?Eyes:  ?Extraocular Movements: Extraocular movements intact.  ?Pupils: Pupils are equal, round, and reactive to light.  ?Cardiovascular:  ?Rate and Rhythm: Normal rate.  ?Pulmonary:  ?Effort: Pulmonary effort is normal.  ?Breath sounds: No stridor.  ?  Chest:  ?Breasts: ?Right: No mass.  ?Left: Normal. No mass.  ? ?Comments: Bruising noted right breast ?Musculoskeletal:  ?General: Normal range of motion.  ?Cervical back: Normal range of motion.  ?Lymphadenopathy:  ?Upper Body:  ?Right upper body: No supraclavicular or axillary adenopathy.  ?Left upper body: No supraclavicular or axillary adenopathy.  ?Skin: ?General: Skin is warm.  ?Neurological:  ?General: No focal deficit present.  ?Mental Status: She is alert and oriented to person, place, and time.  ?Psychiatric:  ?Mood and  Affect: Mood normal.  ?Behavior: Behavior normal.  ? ? ? ?Labs, Imaging and Diagnostic Testing: ?Patient returns today to evaluate possible RIGHT ?breast asymmetries identified on recent screening mammogram. ?  ?EXAM: ?DIGITAL DIAGNOSTIC UNILATERAL RIGHT MAMMOGRAM WITH TOMOSYNTHESIS AND ?CAD; ULTRASOUND RIGHT BREAST LIMITED ?  ?TECHNIQUE: ?Right digital diagnostic mammography and breast tomosynthesis was ?performed. The images were evaluated with computer-aided detection.; ?Targeted ultrasound examination of the right breast was performed ?  ?COMPARISON: Previous exams including recent screening mammogram ?dated 11/05/2021 ?  ?ACR Breast Density Category b: There are scattered areas of ?fibroglandular density. ?  ?FINDINGS: ?On today's additional diagnostic views, an irregular mass is ?confirmed within the upper-outer quadrant of the RIGHT breast, with ?associated architectural distortion, measuring approximately 8 mm ?greatest dimension. ?  ?There is an additional partially obscured low-density mass within ?the lower inner quadrant of the RIGHT breast, measuring ?approximately 5 mm. ?  ?Targeted ultrasound is performed, showing an irregular hypoechoic ?mass in the RIGHT breast at the 10 o'clock axis, 7 cm from the ?nipple, measuring 9 x 6 x 9 mm, with associated architectural ?distortion, corresponding to the suspicious mammographic finding. ?  ?Adjacent to the mass is a benign dystrophic calcification at the 10 ?o'clock axis, 6 cm from the nipple, measuring 3 mm, corresponding as ?an incidental finding. ?  ?Several benign cysts are seen within the inner and lower RIGHT ?breast, 4-6 o'clock axes, largest measuring 9 mm, corresponding to ?the additional mammographic asymmetry. ?  ?RIGHT axilla was evaluated with ultrasound showing no enlarged or ?morphologically abnormal lymph nodes. ?  ?IMPRESSION: ?1. Irregular hypoechoic mass in the RIGHT breast at the 10 o'clock ?axis, 7 cm from the nipple, measuring 9 mm, with  associated ?architectural distortion, corresponding to the mammographic finding. ?This is a highly suspicious finding for which ultrasound-guided core ?biopsy is recommended. ?2. Additional benign findings within the RIGHT breast, as detailed ?above. ?3. No enlarged or morphologically abnormal lymph nodes in the RIGHT ?axilla. ?  ?RECOMMENDATION: ?Ultrasound-guided core biopsy for the irregular hypoechoic mass in ?the RIGHT breast at the 10 o'clock axis, 7 cm from the nipple, ?measuring 9 mm, with associated architectural distortion. ?  ?Ultrasound-guided biopsy is scheduled on February 23rd. ?  ?I have discussed the findings and recommendations with the patient. ?If applicable, a reminder letter will be sent to the patient ?regarding the next appointment. ?  ?BI-RADS CATEGORY 5: Highly suggestive of malignancy. ?  ?  ?Electronically Signed ?By: Franki Cabot M.D. ?On: 11/28/2021 11:38 ? ?Assessment and Plan:  ? ?Diagnoses and all orders for this visit: ? ?Malignant neoplasm of upper-outer quadrant of right breast in female, estrogen receptor positive (CMS-HCC) ? ? ? ?Discussed breast conserving surgery versus mastectomy reconstruction. Due to tumor size, low-grade and patient age, recommend right breast seed lumpectomy. I discussed the pros and cons of sentinel lymph node mapping and omission with review of recent literature. Risk of positive nodes 8% or less but more than likely would not affect her treatment  and/or outcome. Recommend holding her blood thinner for 2 days prior to surgery. Pros and cons of breast conserving surgery discussed most complications. She has agreed to proceed.The procedure has been discussed with the patient. Alternatives to surgery have been discussed with the patient. Risks of surgery include bleeding, Infection, Seroma formation, death, and the need for further surgery. The patient understands and wishes to proceed. ? ?No follow-ups on file. ? ?Kennieth Francois, MD  ?

## 2021-12-26 NOTE — Progress Notes (Signed)
?   12/26/21 1841  ?Assess: MEWS Score  ?Temp 98 ?F (36.7 ?C)  ?BP 112/88  ?Pulse Rate (!) 102  ?ECG Heart Rate (!) 119  ?Resp 20  ?Level of Consciousness Alert  ?SpO2 99 %  ?O2 Device Room Air  ?Assess: MEWS Score  ?MEWS Temp 0  ?MEWS Systolic 0  ?MEWS Pulse 2  ?MEWS RR 0  ?MEWS LOC 0  ?MEWS Score 2  ?MEWS Score Color Yellow  ?Assess: if the MEWS score is Yellow or Red  ?Were vital signs taken at a resting state? Yes  ?Focused Assessment No change from prior assessment  ?Early Detection of Sepsis Score *See Row Information* Low  ?MEWS guidelines implemented *See Row Information* Yes  ?Treat  ?Pain Scale 0-10  ?Pain Score 0  ?Take Vital Signs  ?Increase Vital Sign Frequency  Yellow: Q 2hr X 2 then Q 4hr X 2, if remains yellow, continue Q 4hrs  ?Escalate  ?MEWS: Escalate Yellow: discuss with charge nurse/RN and consider discussing with provider and RRT  ?Notify: Charge Nurse/RN  ?Name of Charge Nurse/RN Notified Bier, RN  ?Date Charge Nurse/RN Notified 12/26/21  ?Time Charge Nurse/RN Notified 1922  ?Document  ?Progress note created (see row info) Yes  ? ? ?

## 2021-12-26 NOTE — H&P (Signed)
Surgery cancelled due to sudden a fib RVR ?Will reschedule when cleared  ?

## 2021-12-26 NOTE — ED Notes (Signed)
ED TO INPATIENT HANDOFF REPORT ? ?ED Nurse Name and Phone #: Aleysia Oltmann RN [phone] ? ?S ?Name/Age/Gender ?Shannon Liu ?81 y.o. ?female ?Room/Bed: 004C/004C ? ?Code Status ?  Code Status: Full Code ? ?Home/SNF/Other ?Home ?Patient oriented to: self, place, time, and situation ?Is this baseline? Yes  ? ?Triage Complete: Triage complete  ?Chief Complaint ?Atrial fibrillation ([place]) [I48.91] ? ?Triage Note ?Pt is from day surgery. They sent her to ED to get evaluated for afib. Pts HR was in the 160's and they could not do lumpectomy. Pt is 150's on arrival. Pt is axox4. Pt has an extensive cardiac hx. Pt received 7.39m of Metoprolol   ? ?Allergies ?Allergies  ?Allergen Reactions  ? Morphine And Related Nausea And Vomiting  ? Penicillins Nausea And Vomiting  ? ? ?Level of Care/Admitting Diagnosis ?ED Disposition   ? ? ED Disposition  ?Admit  ? Condition  ?--  ? Comment  ?[id number]? Level of Care: Telemetry Cardiac [103] ? [facility]:: No ? Covid Evaluation: Asymptomatic Screening Protocol (No Symptoms) ? Diagnosis: Atrial fibrillation ([place] [427.31.ICD-9-CM] ? Admitting Physician: HMinus Breeding[[id number]? Attending Physician: HMinus Breeding[[phone]?  ?  ? ?  ? ? ?B ?Medical/Surgery History ?Past Medical History:  ?Diagnosis Date  ? Arthritis   ? Heart murmur   ? HOH (hard of hearing)   ? HTN (hypertension)   ? since pregnant at age 81 ? Hyperlipemia   ? Hypertension   ? [place]ox[place]   ? a. dx on 06/2015 admission. spontaneously converted into NSR. placed on Eliquis  ? Sleep apnea   ? ?Past Surgical History:  ?Procedure Laterality Date  ? ABDOMINAL HYSTERECTOMY    ? BREAST CYST EXCISION Left   ? BREAST SURGERY Left   ? lumpectomy 35 yrs ago  ? NECK SURGERY    ? discectomy with cadaver bone  ?  ? ?A ?IV Location/Drains/Wounds ?Patient Lines/Drains/Airways  Status   ? ? Active Line/Drains/Airways   ? ? Name Placement date Placement time Site Days  ? Peripheral IV 12/26/21 20 G Left Hand 12/26/21  1157  Hand  less than 1  ? ?  ?  ? ?  ? ? ?Intake/Output Last 24 hours ? ?Intake/Output Summary (Last 24 hours) at 12/26/2021 1656 ?Last data filed at 12/26/2021 1655 ?Gross per 24 hour  ?Intake 213.17 ml  ?Output 0 ml  ?Net 213.17 ml  ? ? ?Labs/Imaging ?Results for orders placed or performed during the hospital encounter of 12/26/21 (from the past 48 hour(s))  ?Basic metabolic panel per protocol     Status: Abnormal  ? Collection Time: 12/25/21  1:59 PM  ?Result Value Ref Range  ? Sodium 139 135 - 145 mmol/L  ? Potassium 3.6 3.5 - 5.1 mmol/L  ? Chloride 101 98 - 111 mmol/L  ? CO2 29 22 - 32 mmol/L  ? Glucose, Bld 114 (H) 70 - 99 mg/dL  ?  Comment: Glucose reference range applies only to samples taken after fasting for at least 8 hours.  ? BUN 22 8 - 23 mg/dL  ? Creatinine, Ser 0.97 0.44 - 1.00 mg/dL  ? Calcium 9.4 8.9 - 10.3 mg/dL  ? GFR, Estimated 59 (L) >60 mL/min  ?  Comment: (NOTE) ?Calculated using the CKD-EPI Creatinine Equation (2021) ?  ? Anion gap 9 5 - 15  ?  Comment: Performed at [facility], [place] [address], [place], [place] [address]  ?Magnesium     Status: None  ? Collection Time: 12/26/21  2:43 PM  ?Result Value Ref Range  ? Magnesium 1.7 1.7 - 2.4 mg/dL  ?  Comment: Performed at [facility], [place] [address], [place], [place] [address]  ?CBC     Status: None  ? Collection Time: 12/26/21  2:43 PM  ?Result Value Ref Range  ? WBC 10.5 4.0 - 10.5 K/uL  ? RBC 4.31 3.87 - 5.11 MIL/uL  ? Hemoglobin 13.1 12.0 - 15.0 g/dL  ? HCT 39.8 36.0 - 46.0 %  ? MCV 92.3 80.0 - 100.0 fL  ? MCH 30.4 26.0 - 34.0 pg  ? MCHC 32.9 30.0 - 36.0 g/dL  ? RDW 14.2 11.5 - 15.5 %  ? Platelets 205 150 - 400 K/uL  ? nRBC 0.0 0.0 - 0.2 %  ?  Comment: Performed at [facility], [place] [address] [place], [place] [address]  ?Basic metabolic panel     Status: Abnormal  ? Collection  Time: 12/26/21  2:43 PM  ?Result Value Ref Range  ? Sodium 138 135 - 145 mmol/L  ? Potassium 4.0 3.5 - 5.1 mmol/L  ?  Comment: SLIGHT HEMOLYSIS  ? Chloride 102 98 - 111 mmol/L  ? CO2 24 22 - 32 mmol/L  ? Glucose, Bld 113 (H) 70 - 99 mg/dL  ?  Comment: Glucose reference range applies only to samples taken after fasting for at least 8 hours.  ? BUN 15 8 - 23 mg/dL  ? Creatinine, Ser 0.64 0.44 - 1.00 mg/dL  ? Calcium 8.6 (L) 8.9 - 10.3 mg/dL  ? GFR, Estimated >60 >60 mL/min  ?  Comment: (NOTE) ?Calculated using the CKD-EPI Creatinine Equation (2021) ?  ? Anion gap 12 5 - 15  ?  Comment: Performed at [facility], [place] [address], [place], [place] [address]  ? ?MM RT RADIOACTIVE SEED LOC MAMMO GUIDE ? ?Result Date: 12/25/2021 ?CLINICAL DATA:  81 year old female with recently diagnosed right breast invasive ductal carcinoma. Patient presents for seed localization. EXAM: MAMMOGRAPHIC GUIDED RADIOACTIVE SEED LOCALIZATION OF THE RIGHT BREAST COMPARISON:  Previous exam(s). FINDINGS: Patient presents for radioactive seed localization prior to right breast lumpectomy. I met with the patient and we discussed the procedure of seed localization including benefits and alternatives. We discussed the high likelihood of a successful procedure. We discussed the risks of the procedure including infection, bleeding, tissue injury and further surgery. We discussed the low dose of radioactivity involved in the procedure. Informed, written consent was given. The usual time-out protocol was performed immediately prior to the procedure. Using mammographic guidance, sterile technique, 1% lidocaine and an I-125 radioactive seed, the ribbon biopsy marking clip was localized using a lateral approach. The follow-up mammogram images confirm the seed in the expected location and were marked for Dr. Brantley Stage. Follow-up survey of the patient confirms presence of the radioactive seed. Order number of I-125 seed:  [id number]. Total activity: 0.239  mCi reference Date: 12/10/2021 The patient tolerated the procedure well and was released from the [facility]. She was given instructions regarding seed removal. IMPRESSION: Radioactive seed localization right breast. No apparent complications. Electronically Signed   By: Audie Pinto M.D.   On: 12/25/2021 13:48  ? ?Pending Labs ?Unresulted Labs (From admission, onward)  ? ?  Start     Ordered  ? 12/27/21 8889  Basic metabolic  panel  Tomorrow morning,   R       ? 12/26/21 1639  ? 12/26/21 1444  TSH  ONCE - STAT,   STAT       ? 12/26/21 1444  ? ?  ?  ? ?  ? ? ?Vitals/Pain ?Today's Vitals  ? 12/26/21 1456 12/26/21 1500 12/26/21 1515 12/26/21 1545  ?BP: 107/90 123/86 (!) 116/93 108/69  ?Pulse: (!) 140 (!) 107 73 95  ?Resp: 15 16 (!) 27 17  ?Temp: 98.1 ?F (36.7 ?C)     ?TempSrc: Oral     ?SpO2: 97% 97% 99% 98%  ?Weight:      ?Height:      ?PainSc:      ? ? ?Isolation Precautions ?No active isolations ? ?Medications ?Medications  ?diltiazem (CARDIZEM) 1 mg/mL load via infusion 10 mg (10 mg Intravenous Bolus from Bag 12/26/21 1551)  ?  And  ?diltiazem (CARDIZEM) 125 mg in dextrose 5% 125 mL (1 mg/mL) infusion (10 mg/hr Intravenous Infusion Verify 12/26/21 1626)  ?acetaminophen (TYLENOL) tablet 500 mg (has no administration in time range)  ?rosuvastatin (CRESTOR) tablet 10 mg (has no administration in time range)  ?ALPRAZolam (XANAX) tablet 0.25 mg (has no administration in time range)  ?potassium chloride (KLOR-CON) CR tablet 10 mEq (has no administration in time range)  ?Propylene Glycol 0.6 % SOLN 1 drop (has no administration in time range)  ?ondansetron (ZOFRAN) injection 4 mg (has no administration in time range)  ?metoprolol tartrate (LOPRESSOR) tablet 25 mg (has no administration in time range)  ?apixaban (ELIQUIS) tablet 5 mg (has no administration in time range)  ? ? ?Mobility ?walks ?Moderate fall risk  ? ?Focused Assessments ?Cardiac Assessment Handoff: ? ?Cardiac Rhythm: Atrial fibrillation ?Lab Results   ?Component Value Date  ? TROPONINI <0.03 06/24/2015  ? ? ? ?R ?Recommendations: See Admitting Provider Note ? ?Report given to: Pablo Lawrence RN ?  ?

## 2021-12-26 NOTE — ED Provider Notes (Signed)
?[facility] ?Provider Note ? ? ?CSN: [id number] ?Arrival date & time: 12/26/21  1423 ? ?  ? ?History ? ?Chief Complaint  ?Patient presents with  ? Tachycardia  ? ? ?Shannon Liu is a 81 y.o. female. ? ?HPI ?Patient presents from PACU for tachycardia.  She was scheduled for a right breast lumpectomy today.  During preop, she was found to be in atrial fibrillation with RVR.  Surgery was canceled and patient was sent to the emergency department.  Per chart review, patient has history of paroxysmal atrial fibrillation, HTN, HLD, use of anticoagulation, and breast cancer.  Home medications include Eliquis and Cardizem.  Due to her upcoming surgery, patient has held her Eliquis for the past 2 days.  For her Cardizem, she takes a 120 mg dose in the morning and a 120 mg dose at night.  She has not missed any doses.  Her last dose of this was this morning.  Patient reports that she has been in her normal state of health leading up until today.  Currently, she denies any symptoms.  When she had her tachycardia identified, she also had no symptoms. ?  ? ?Home Medications ?Prior to Admission medications   ?Medication Sig Start Date End Date Taking? Authorizing Provider  ?acetaminophen (TYLENOL) 500 MG tablet Take 500 mg by mouth every 6 (six) hours as needed for mild pain.   Yes [provider]  ?ALPRAZolam (XANAX) 0.25 MG tablet Take 0.25 mg by mouth at bedtime as needed for anxiety.   Yes [provider]  ?chlorthalidone (HYGROTON) 25 MG tablet Take 25 mg by mouth daily. 12/17/19  Yes [provider]  ?Cholecalciferol (VITAMIN D-3) 125 MCG (5000 UT) TABS Take 1,000 mg by mouth 2 (two) times daily.   Yes [provider]  ?diltiazem (CARDIZEM CD) 120 MG 24 hr capsule Take 1 capsule (120 mg total) by mouth 2 (two) times daily. Please make yearly appt with Dr. Marlou Porch for May 2023 for future refills. Thank you 1st attempt ?Patient taking differently: Take  120 mg by mouth 2 (two) times daily. 12/04/21  Yes Jerline Pain, MD  ?diltiazem (CARDIZEM) 30 MG tablet Take 1 tablet every 4 hours AS NEEDED for AFIB heart rate >100 ?Patient taking differently: Take 30 mg by mouth every 4 (four) hours as needed (For AFIB heart rate >100). 07/13/18  Yes Sherran Needs, NP  ?ELIQUIS 5 MG TABS tablet TAKE ONE TABLET BY MOUTH TWICE DAILY ?Patient taking differently: Take 5 mg by mouth in the morning and at bedtime. 09/03/21  Yes Jerline Pain, MD  ?Lactobacillus (PROBIOTIC ACIDOPHILUS PO) Take 1 tablet by mouth daily.   Yes [provider]  ?Multiple Vitamins-Minerals (MULTIVITAMIN WITH MINERALS) tablet Take 1 tablet by mouth daily.   Yes [provider]  ?potassium chloride (K-DUR) 10 MEQ tablet Take 10 mEq by mouth daily. 01/25/19  Yes [provider]  ?Propylene Glycol 0.6 % SOLN Place 1 drop into both eyes at bedtime.    Yes [provider]  ?rosuvastatin (CRESTOR) 10 MG tablet Take 10 mg by mouth daily. 09/26/21  Yes [provider]  ?telmisartan (MICARDIS) 80 MG tablet Take 80 mg by mouth daily. 12/17/19  Yes [provider]  ?   ? ?Allergies    ?Morphine and related and Penicillins   ? ?Review of Systems   ?Review of Systems  ?Respiratory:  Negative for chest tightness and shortness of breath.   ?Cardiovascular:  Negative for chest pain and palpitations.  ?Gastrointestinal:  Negative for nausea.  ?All other systems reviewed and are negative. ? ?Physical Exam ?Updated Vital Signs ?BP 106/82 (BP Location: Left Arm)   Pulse (!) 104   Temp 98.4 ?F (36.9 ?C) (Oral)   Resp 20   Ht '5\' 7"'$  (1.702 m)   Wt 97.8 kg   SpO2 98%   BMI 33.77 kg/m?  ?Physical Exam ?Vitals and nursing note reviewed.  ?Constitutional:   ?   General: She is not in acute distress. ?   Appearance: Normal appearance. She is well-developed. She is not ill-appearing, toxic-appearing or diaphoretic.  ?HENT:  ?   Head: Normocephalic and atraumatic.  ?   Right  Ear: External ear normal.  ?   Left Ear: External ear normal.  ?   Nose: Nose normal.  ?   Mouth/Throat:  ?   Mouth: Mucous membranes are moist.  ?   Pharynx: Oropharynx is clear.  ?Eyes:  ?   Extraocular Movements: Extraocular movements intact.  ?   Conjunctiva/sclera: Conjunctivae normal.  ?Cardiovascular:  ?   Rate and Rhythm: Tachycardia present. Rhythm irregular.  ?   Heart sounds: No murmur heard. ?Pulmonary:  ?   Effort: Pulmonary effort is normal. No respiratory distress.  ?   Breath sounds: Normal breath sounds. No wheezing or rales.  ?Chest:  ?   Chest wall: No tenderness.  ?Abdominal:  ?   Palpations: Abdomen is soft.  ?   Tenderness: There is no abdominal tenderness.  ?Musculoskeletal:     ?   General: No swelling. Normal range of motion.  ?   Cervical back: Normal range of motion. No rigidity.  ?Skin: ?   General: Skin is warm and dry.  ?   Coloration: Skin is not jaundiced or pale.  ?Neurological:  ?   General: No focal deficit present.  ?   Mental Status: She is alert and oriented to person, place, and time.  ?   Cranial Nerves: No cranial nerve deficit.  ?   Sensory: No sensory deficit.  ?   Motor: No weakness.  ?   Coordination: Coordination normal.  ?Psychiatric:     ?   Mood and Affect: Mood normal.     ?   Behavior: Behavior normal.     ?   Thought Content: Thought content normal.     ?   Judgment: Judgment normal.  ? ? ?ED Results / Procedures / Treatments   ?Labs ?(all labs ordered are listed, but only abnormal results are displayed) ?Labs Reviewed  ?BASIC METABOLIC PANEL - Abnormal; Notable for the following components:  ?    Result Value  ? Glucose, Bld 114 (*)   ? GFR, Estimated 59 (*)   ? All other components within normal limits  ?BASIC METABOLIC PANEL - Abnormal; Notable for the following components:  ? Glucose, Bld 113 (*)   ? Calcium 8.6 (*)   ? All other components within normal limits  ?BASIC METABOLIC PANEL - Abnormal; Notable for the following components:  ? Potassium 3.1 (*)   ?  Glucose, Bld 120 (*)   ? Calcium 8.6 (*)   ? All other components within normal limits  ?BASIC METABOLIC PANEL - Abnormal; Notable for the following components:  ? Glucose, Bld 126 (*)   ? Calcium 8.6 (*)   ? All other components within normal limits  ?MAGNESIUM  ?CBC  ?TSH  ?MAGNESIUM  ?MAGNESIUM  ? ? ?EKG ?None ? ?  Radiology ?ECHOCARDIOGRAM COMPLETE ? ?Result Date: 12/27/2021 ?   ECHOCARDIOGRAM REPORT   Patient Name:   YAMNA MACKEL Date of Exam: 12/27/2021 Medical Rec #:  [id number]      Height:       67.0 in Accession #:    [id number]     Weight:       215.6 lb Date of Birth:  1941/09/19      BSA:          2.088 m? Patient Age:    23 years       BP:           112/71 mmHg Patient Gender: F              HR:           100 bpm. Exam Location:  Inpatient Procedure: 2D Echo, Cardiac Doppler and Color Doppler Indications:    Afib  History:        Patient has no prior history of Echocardiogram examinations.                 Arrythmias:Atrial Fibrillation; Risk Factors:Hypertension.  Sonographer:    Jyl Heinz Referring Phys: [id number] National  1. Left ventricular ejection fraction, by estimation, is 40 to 45%. The left ventricle has mildly decreased function. The left ventricle demonstrates global hypokinesis. The left ventricular internal cavity size was mildly dilated. There is mild left ventricular hypertrophy. Left ventricular diastolic function could not be evaluated.  2. Right ventricular systolic function is mildly reduced. The right ventricular size is mildly enlarged. There is mildly elevated pulmonary artery systolic pressure. The estimated right ventricular systolic pressure is 36.6 mmHg.  3. Left atrial size was mildly dilated.  4. Right atrial size was moderately dilated.  5. A small pericardial effusion is present.  6. The mitral valve is normal in structure. Mild mitral valve regurgitation.  7. Tricuspid valve regurgitation is mild to moderate.  8. The aortic valve is normal in  structure. Aortic valve regurgitation is not visualized. No aortic stenosis is present.  9. The inferior vena cava is dilated in size with <50% respiratory variability, suggesting right atrial pressure of 15 mmHg. FINDING

## 2021-12-26 NOTE — Telephone Encounter (Signed)
Pt ws in A-Fib when she showed up for her procedure below.  They are asking that pt be evaluated / cleared before they can reschedule.  ?

## 2021-12-26 NOTE — Progress Notes (Signed)
)  Patients daughter notified of status per Dr. Brantley Stage. Md  plan to admit to [facility] for more evaluation. ?

## 2021-12-26 NOTE — Interval H&P Note (Signed)
History and Physical Interval Note: ? ?12/26/2021 ?12:52 PM ? ?Shannon Liu  has presented today for surgery, with the diagnosis of RIGHT BREAST CANCER.  The various methods of treatment have been discussed with the patient and family. After consideration of risks, benefits and other options for treatment, the patient has consented to  Procedure(s): ?RIGHT BREAST LUMPECTOMY WITH RADIOACTIVE SEED LOCALIZATION (Right) as a surgical intervention.  The patient's history has been reviewed, patient examined, no change in status, stable for surgery.  I have reviewed the patient's chart and labs.  Questions were answered to the patient's satisfaction.   ? ? ?Arnie Clingenpeel A Damacio Weisgerber ? ? ?

## 2021-12-26 NOTE — Progress Notes (Signed)
Report to ER nurse called by Dr. Roanna Banning .  Dr. Roanna Banning also spoke to patients daughter for update on status and need for next step.  Patients belongings including hearing aides and glassed given to the patients daughter. ?

## 2021-12-26 NOTE — Progress Notes (Signed)
Patient admitted to recovery room with sudden onset rapid atrial fibrilation in OR.  Case canceled medicated as per AMD and CRNA as per record.  Patient admitted asymptomatic and alert .   ?

## 2021-12-26 NOTE — Telephone Encounter (Signed)
? ?  Name: Shannon Liu  ?DOB: 09-20-1941  ?MRN: [id number] ? ?Primary Cardiologist: Candee Furbish, MD ? ?Chart reviewed as part of pre-operative protocol coverage. Because of Lavene Penagos Demetrius's past medical history and time since last visit, she will require a follow-up visit in order to better assess preoperative cardiovascular risk. ? ?Pre-op covering staff: ?- Please schedule appointment and call patient to inform them. If patient already had an upcoming appointment within acceptable timeframe, please add "pre-op clearance" to the appointment notes so provider is aware. ?- Please contact requesting surgeon's office via preferred method (i.e, phone, fax) to inform them of need for appointment prior to surgery. ? ?If applicable, this message will also be routed to pharmacy pool and/or primary cardiologist for input on holding anticoagulant/antiplatelet agent as requested below so that this information is available to the clearing provider at time of patient's appointment.  ? ?Almyra Deforest, [place]  ?12/26/2021, 3:27 PM   ?

## 2021-12-26 NOTE — ED Notes (Signed)
Cardiology PA at bedside. 

## 2021-12-26 NOTE — ED Triage Notes (Signed)
Pt is from day surgery. They sent her to ED to get evaluated for afib. Pts HR was in the 160's and they could not do lumpectomy. Pt is 150's on arrival. Pt is axox4. Pt has an extensive cardiac hx. Pt received 7.'5mg'$  of Metoprolol  ?

## 2021-12-26 NOTE — Anesthesia Preprocedure Evaluation (Signed)
Anesthesia Evaluation  ?Patient identified by MRN, date of birth, ID band ?Patient awake ? ? ? ?Reviewed: ?Allergy & Precautions, NPO status , Patient's Chart, lab work & pertinent test results ? ?Airway ?Mallampati: II ? ?TM Distance: >3 FB ?Neck ROM: Full ? ? ? Dental ?no notable dental hx. ? ?  ?Pulmonary ?sleep apnea and Continuous Positive Airway Pressure Ventilation ,  ?  ?Pulmonary exam normal ?breath sounds clear to auscultation ? ? ? ? ? ? Cardiovascular ?hypertension, Pt. on medications ?Normal cardiovascular exam+ dysrhythmias Atrial Fibrillation  ?Rhythm:Regular Rate:Normal ? ? ?  ?Neuro/Psych ?negative neurological ROS ? negative psych ROS  ? GI/Hepatic ?negative GI ROS, Neg liver ROS,   ?Endo/Other  ?negative endocrine ROS ? Renal/GU ?negative Renal ROS  ? ?  ?Musculoskeletal ? ?(+) Arthritis ,  ? Abdominal ?(+) + obese,   ?Peds ? Hematology ?negative hematology ROS ?(+)   ?Anesthesia Other Findings ? ? Reproductive/Obstetrics ? ?  ? ? ? ? ? ? ? ? ? ? ? ? ? ?  ?  ? ? ? ? ? ? ? ? ?Anesthesia Physical ?Anesthesia Plan ? ?ASA: 3 ? ?Anesthesia Plan: General  ? ?Post-op Pain Management:   ? ?Induction: Intravenous ? ?PONV Risk Score and Plan: 3 and Ondansetron, Dexamethasone and Treatment may vary due to age or medical condition ? ?Airway Management Planned: LMA ? ?Additional Equipment:  ? ?Intra-op Plan:  ? ?Post-operative Plan: Extubation in OR ? ?Informed Consent: I have reviewed the patients History and Physical, chart, labs and discussed the procedure including the risks, benefits and alternatives for the proposed anesthesia with the patient or authorized representative who has indicated his/her understanding and acceptance.  ? ? ? ?Dental advisory given ? ?Plan Discussed with: CRNA ? ?Anesthesia Plan Comments:   ? ? ? ? ? ? ?Anesthesia Quick Evaluation ? ?

## 2021-12-26 NOTE — H&P (Addendum)
?Cardiology Admission History and Physical:  ? ?Patient ID: Shannon Liu ?MRN: [id number]; DOB: 1941-07-02  ? ?Admission date: 12/26/2021 ? ?PCP:  Lavone Orn, MD ?  ?[facility] Providers ?Cardiologist:  Candee Furbish, MD   ? ? ?Chief Complaint:  Afib with RVR ? ?Patient Profile:  ? ?Shannon Liu is a 81 y.o. female with paroxysmal atrial fibrillation on eliquis, HTN, HLD, breast cancer who is being seen 12/26/2021 for the evaluation of atrial fibrillation with RVR. ? ?History of Present Illness:  ? ?Shannon Liu is an 81 year old female with above medical history who is followed by Dr. Marlou Porch. Per chart review, patient was first seen by cardiology in September of 2016. At that time, patient was admitted to the hospital for the evaluation of chest pain and was found to have atrial fibrillation with RVR. Patient spontaneously converted while admitted. Patient had an echocardiogram on 06/24/2015 that showed EF 45-50%, moderate LVH, grade I diastolic dysfunction. Underwent a nuclear stress tes on 07/06/2015 that was a low risk study without evidence of ischemia or infarction, EF 70%.  ? ?Patient was last seen by cardiology on 03/02/2021. At that appointment, patient denied exertional SOB, chest pain, tightness, palpitations. Was stable on her regiment of Cardizem CD 120 mg and Eliquis 5 mg BID.  ? ?Patient is currently being treated for a malignant neoplasm of upper-outer quadrant of right breast. Patient presented to the [facility] on 3/15 for a planned right breast lumpectomy with radioactive seed localization. While in preop, patient was found to be in atrial fibrillation with RVR. Reportedly given esmolol and metoprolol in attempts to slow her heart rate, but HR stayed elevated. Advised to present to [facility] ED for cardiology evaluation.  ? ?ED labs pending.  ?EKG showed atrial fibrillation with a rate of 131.  ? ?On interview, patient reports that for the past few weeks, she has noticed her heart  racing more frequently than usual. When patient has had episodes of atrial fibrillation in the past, she could feel palpitations. Previously, patient's atrial fibrillation was well controlled and she rarely had symptoms. However, she has had almost constant palpitations for the past 2 weeks. Denies chest pain, SOB, dizziness/lightheadedness. Endorses some feelings of anxiety, attributed them to her planned surgery and diagnosis of breast cancer. In anticipation of her surgery, patient has not taken any of her eliquis since Sunday evening 3/12. Denies any known history of CAD, heart attack ,or heart failure.  ? ?Past Medical History:  ?Diagnosis Date  ? Arthritis   ? Heart murmur   ? HOH (hard of hearing)   ? HTN (hypertension)   ? since pregnant at age 20  ? Hyperlipemia   ? Hypertension   ? PAF (paroxysmal atrial fibrillation) ([facility])   ? a. dx on 06/2015 admission. spontaneously converted into NSR. placed on Eliquis  ? Sleep apnea   ? ? ?Past Surgical History:  ?Procedure Laterality Date  ? ABDOMINAL HYSTERECTOMY    ? BREAST CYST EXCISION Left   ? BREAST SURGERY Left   ? lumpectomy 35 yrs ago  ? NECK SURGERY    ? discectomy with cadaver bone  ?  ? ?Medications Prior to Admission: ?Prior to Admission medications   ?Medication Sig Start Date End Date Taking? Authorizing Provider  ?acetaminophen (TYLENOL) 500 MG tablet Take 500 mg by mouth every 6 (six) hours as needed for mild pain.   Yes [provider]  ?ALPRAZolam (XANAX) 0.25 MG tablet Take 0.25 mg  by mouth at bedtime as needed for anxiety.   Yes [provider]  ?chlorthalidone (HYGROTON) 25 MG tablet Take 25 mg by mouth daily. 12/17/19  Yes [provider]  ?Cholecalciferol (VITAMIN D-3) 125 MCG (5000 UT) TABS Take 1,000 mg by mouth.   Yes [provider]  ?diltiazem (CARDIZEM CD) 120 MG 24 hr capsule Take 1 capsule (120 mg total) by mouth 2 (two) times daily. Please make yearly appt with Dr. Marlou Porch for May 2023 for future  refills. Thank you 1st attempt 12/04/21  Yes Jerline Pain, MD  ?diltiazem (CARDIZEM) 30 MG tablet Take 1 tablet every 4 hours AS NEEDED for AFIB heart rate >100 07/13/18  Yes Sherran Needs, NP  ?Lactobacillus (PROBIOTIC ACIDOPHILUS PO) Take by mouth.   Yes [provider]  ?Multiple Vitamins-Minerals (MULTIVITAMIN WITH MINERALS) tablet Take 1 tablet by mouth daily.   Yes [provider]  ?potassium chloride (K-DUR) 10 MEQ tablet Take 10 mEq by mouth daily. with food 01/25/19  Yes [provider]  ?Propylene Glycol 0.6 % SOLN Place 1 drop into both eyes at bedtime.    Yes [provider]  ?rosuvastatin (CRESTOR) 10 MG tablet Take 10 mg by mouth daily. 09/26/21  Yes [provider]  ?telmisartan (MICARDIS) 80 MG tablet Take 80 mg by mouth daily. 12/17/19  Yes [provider]  ?ELIQUIS 5 MG TABS tablet TAKE ONE TABLET BY MOUTH TWICE DAILY 09/03/21   Jerline Pain, MD  ?  ? ?Allergies:    ?Allergies  ?Allergen Reactions  ? Morphine And Related Nausea And Vomiting  ? Penicillins Nausea And Vomiting  ? ? ?Social History:   ?Social History  ? ?Socioeconomic History  ? Marital status: Widowed  ?  Spouse name: Not on file  ? Number of children: Not on file  ? Years of education: Not on file  ? Highest education level: Not on file  ?Occupational History  ? Occupation: Retired  ?Tobacco Use  ? Smoking status: Never  ? Smokeless tobacco: Never  ?Vaping Use  ? Vaping Use: Never used  ?Substance and Sexual Activity  ? Alcohol use: No  ?  Alcohol/week: 0.0 standard drinks  ? Drug use: No  ? Sexual activity: Not on file  ?Other Topics Concern  ? Not on file  ?Social History Narrative  ? Lives [place], near [place]. Daughter currently lives with her.  ? ?Social Determinants of Health  ? ?Financial Resource Strain: Not on file  ?Food Insecurity: Not on file  ?Transportation Needs: Not on file  ?Physical Activity: Not on file  ?Stress: Not on file  ?Social  Connections: Not on file  ?Intimate Partner Violence: Not on file  ?  ?Family History:   ?The patient's family history includes CVA (age of onset: 40) in her mother; Colon cancer in her maternal grandfather; Heart attack (age of onset: 52) in her mother; Hypertension in her brother and sister; Prostate cancer (age of onset: 22) in her son; Unexplained death (age of onset: 49) in her father. There is no history of Breast cancer.   ? ?ROS:  ?Please see the history of present illness.  ?All other ROS reviewed and negative.    ? ?Physical Exam/Data:  ? ?Vitals:  ? 12/26/21 1345 12/26/21 1347 12/26/21 1355 12/26/21 1400  ?BP: (!) 143/128 116/90 (!) 109/94 106/82  ?Pulse: (!) 103 89 (!) 117 (!) 105  ?Resp: 12 (!) 21 17 (!) 24  ?Temp:      ?  TempSrc:      ?SpO2: 100% 99% 100% 98%  ?Weight:      ?Height:      ? ? ?Intake/Output Summary (Last 24 hours) at 12/26/2021 1437 ?Last data filed at 12/26/2021 1327 ?Gross per 24 hour  ?Intake 200 ml  ?Output 0 ml  ?Net 200 ml  ? ?Last 3 Weights 12/26/2021 12/17/2021 12/12/2021  ?Weight (lbs) 211 lb 3.2 oz 210 lb 212 lb 12.8 oz  ?Weight (kg) 95.8 kg 95.255 kg 96.525 kg  ?   ?Body mass index is 33.08 kg/m?.  ?General:  Well nourished, well developed, in no acute distress ?HEENT: normal ?Neck: no JVD ?Vascular: Radial pulses 2+ bilaterally   ?Cardiac:  Irregular rate and rhythm, tachycardic, no murmurs  ?Lungs:  clear to auscultation bilaterally, no wheezing, rhonchi or rales  ?Abd: soft, nontender, no hepatomegaly  ?Ext: trace edema  ?Musculoskeletal:  No deformities, BUE and BLE strength normal and equal ?Skin: warm and dry  ?Neuro:  CNs 2-12 intact, no focal abnormalities noted ?Psych:  Normal affect  ? ? ?EKG:  The ECG that was done 12/25/21 was personally reviewed and demonstrates atrial fibrillation with RVR (HR 131)  ? ?Relevant CV Studies: ? ? ?Laboratory Data: ? ?High Sensitivity Troponin:  No results for input(s): TROPONINIHS in the last 720 hours.    ?Chemistry ?Recent Labs  ?Lab  12/25/21 ?1359  ?NA 139  ?K 3.6  ?CL 101  ?CO2 29  ?GLUCOSE 114*  ?BUN 22  ?CREATININE 0.97  ?CALCIUM 9.4  ?GFRNONAA 59*  ?ANIONGAP 9  ?  ?No results for input(s): PROT, ALBUMIN, AST, ALT, ALKPHOS, BILITOT in the last 16

## 2021-12-26 NOTE — Anesthesia Postprocedure Evaluation (Signed)
Anesthesia Post Note ? ?Patient: NAYLEAH GAMEL ? ?Procedure(s) Performed: CANCELLED PROCEDURE ? ?  ? ?Patient location during evaluation: PACU ?Anesthesia Type: MAC ?Level of consciousness: awake and alert ?Pain management: pain level controlled ?Vital Signs Assessment: post-procedure vital signs reviewed and stable ?Respiratory status: spontaneous breathing, nonlabored ventilation, respiratory function stable and patient connected to nasal cannula oxygen ?Cardiovascular status: blood pressure returned to baseline and stable ?Postop Assessment: no apparent nausea or vomiting ?Anesthetic complications: yes (Case cancelled due to a-fib with RVR) ?Comments: Spoke to cardiology regarding patient. She was transferred to [facility] ED by [facility].  ? ? ?No notable events documented. ? ?Last Vitals:  ?Vitals:  ? 12/26/21 1515 12/26/21 1545  ?BP: (!) 116/93 108/69  ?Pulse: 73 95  ?Resp: (!) 27 17  ?Temp:    ?SpO2: 99% 98%  ?  ?Last Pain:  ?Vitals:  ? 12/26/21 1456  ?TempSrc: Oral  ?PainSc:   ? ? ?  ?  ?  ?  ?  ?  ? ?Jamilex Bohnsack P Sreenidhi Ganson ? ? ? ? ?

## 2021-12-26 NOTE — Telephone Encounter (Signed)
1st attempt to reach pt regarding surgical clearance and the need for an appointment.  Left message for pt to call and schedule 1st available.  ?

## 2021-12-26 NOTE — Transfer of Care (Signed)
Immediate Anesthesia Transfer of Care Note ? ?Patient: Shannon Liu ? ?Procedure(s) Performed: RIGHT BREAST LUMPECTOMY WITH RADIOACTIVE SEED LOCALIZATION (Right: Breast) ? ?Patient Location: PACU ? ?Anesthesia Type:MAC ? ?Level of Consciousness: awake, alert  and oriented ? ?Airway & Oxygen Therapy: Patient Spontanous Breathing and Patient connected to face mask oxygen ? ?Post-op Assessment: Report given to RN and Post -op Vital signs reviewed and stable ? ?Post vital signs: Reviewed and stable ? ?Last Vitals:  ?Vitals Value Taken Time  ?BP    ?Temp    ?Pulse    ?Resp    ?SpO2    ? ? ?Last Pain:  ?Vitals:  ? 12/26/21 1150  ?TempSrc: Oral  ?PainSc: 0-No pain  ?   ? ?  ? ?Complications: No notable events documented. ?

## 2021-12-27 ENCOUNTER — Observation Stay (HOSPITAL_BASED_OUTPATIENT_CLINIC_OR_DEPARTMENT_OTHER): Payer: Medicare PPO

## 2021-12-27 DIAGNOSIS — I4891 Unspecified atrial fibrillation: Secondary | ICD-10-CM | POA: Diagnosis not present

## 2021-12-27 DIAGNOSIS — I4819 Other persistent atrial fibrillation: Secondary | ICD-10-CM | POA: Diagnosis not present

## 2021-12-27 LAB — BASIC METABOLIC PANEL
Anion gap: 9 (ref 5–15)
BUN: 14 mg/dL (ref 8–23)
CO2: 26 mmol/L (ref 22–32)
Calcium: 8.6 mg/dL — ABNORMAL LOW (ref 8.9–10.3)
Chloride: 101 mmol/L (ref 98–111)
Creatinine, Ser: 0.73 mg/dL (ref 0.44–1.00)
GFR, Estimated: >60 mL/min (ref >60)
Glucose, Bld: 120 mg/dL — ABNORMAL HIGH (ref 70–99)
Potassium: 3.1 mmol/L — ABNORMAL LOW (ref 3.5–5.1)
Sodium: 136 mmol/L (ref 135–145)

## 2021-12-27 LAB — ECHOCARDIOGRAM COMPLETE
AR max vel: 2.02 cm2
AV Peak grad: 4.8 mmHg
Ao pk vel: 1.1 m/s
Area-P 1/2: 3.99 cm2
Calc EF: 43.7 %
Height: 67 in
S' Lateral: 3.5 cm
Single Plane A2C EF: 47.3 %
Single Plane A4C EF: 41.8 %
Weight: 3449.6 oz

## 2021-12-27 LAB — MAGNESIUM: Magnesium: 1.7 mg/dL (ref 1.7–2.4)

## 2021-12-27 MED ORDER — DILTIAZEM HCL 60 MG PO TABS
30.0000 mg | ORAL_TABLET | Freq: Four times a day (QID) | ORAL | Status: DC
  Administered 2021-12-27 – 2021-12-28 (×4): 30 mg via ORAL
  Filled 2021-12-27 (×5): qty 1

## 2021-12-27 MED ORDER — MAGNESIUM SULFATE 2 GM/50ML IV SOLN
2.0000 g | Freq: Once | INTRAVENOUS | Status: AC
  Administered 2021-12-27: 2 g via INTRAVENOUS
  Filled 2021-12-27: qty 50

## 2021-12-27 MED ORDER — POTASSIUM CHLORIDE CRYS ER 20 MEQ PO TBCR
40.0000 meq | EXTENDED_RELEASE_TABLET | Freq: Once | ORAL | Status: AC
  Administered 2021-12-27: 40 meq via ORAL
  Filled 2021-12-27: qty 2

## 2021-12-27 MED ORDER — DIGOXIN 125 MCG PO TABS
0.1250 mg | ORAL_TABLET | Freq: Every day | ORAL | Status: DC
  Administered 2021-12-27 – 2021-12-31 (×5): 0.125 mg via ORAL
  Filled 2021-12-27 (×5): qty 1

## 2021-12-27 NOTE — Progress Notes (Signed)
? ?Progress Note ? ?Patient Name: Shannon Liu ?Date of Encounter: 12/27/2021 ? ?Primary Cardiologist:   Candee Furbish, MD ? ? ?Subjective  ? ?She feels OK.  No chest pain .  No SOB.  ? ?Inpatient Medications  ?  ?Scheduled Meds: ? apixaban  5 mg Oral BID  ? metoprolol tartrate  25 mg Oral BID  ? polyvinyl alcohol  1 drop Both Eyes QHS  ? potassium chloride  10 mEq Oral Daily  ? rosuvastatin  10 mg Oral Daily  ? ?Continuous Infusions: ? diltiazem (CARDIZEM) infusion 10 mg/hr (12/27/21 0600)  ? ?PRN Meds: ?acetaminophen, ALPRAZolam, ondansetron (ZOFRAN) IV  ? ?Vital Signs  ?  ?Vitals:  ? 12/27/21 0040 12/27/21 0300 12/27/21 0340 12/27/21 0740  ?BP: 102/67  107/61 104/63  ?Pulse: 70 (!) 102 92   ?Resp:   18 19  ?Temp: 98.1 ?F (36.7 ?C)  98.3 ?F (36.8 ?C) 98.7 ?F (37.1 ?C)  ?TempSrc: Oral  Oral Oral  ?SpO2: 97% 97% 96% 94%  ?Weight:   97.8 kg   ?Height:      ? ? ?Intake/Output Summary (Last 24 hours) at 12/27/2021 0815 ?Last data filed at 12/27/2021 0600 ?Gross per 24 hour  ?Intake 347.91 ml  ?Output 0 ml  ?Net 347.91 ml  ? ?Filed Weights  ? 12/26/21 1150 12/26/21 1455 12/27/21 0340  ?Weight: 95.8 kg 95.3 kg 97.8 kg  ? ? ?Telemetry  ?  ?Irregular with rapid rate - Personally Reviewed ? ?ECG  ?  ?NA - Personally Reviewed ? ?Physical Exam  ? ?GEN: No acute distress.   ?Neck: No  JVD ?Cardiac: Irregular RR, no murmurs, rubs, or gallops.  ?Respiratory: Clear  to auscultation bilaterally. ?GI: Soft, nontender, non-distended  ?MS: No  edema; No deformity. ?Neuro:  Nonfocal  ?Psych: Normal affect  ? ?Labs  ?  ?Chemistry ?Recent Labs  ?Lab 12/25/21 ?1359 12/26/21 ?1443 12/27/21 ?0325  ?NA 139 138 136  ?K 3.6 4.0 3.1*  ?CL 101 102 101  ?CO2 29 24 26   ?GLUCOSE 114* 113* 120*  ?BUN 22 15 14   ?CREATININE 0.97 0.64 0.73  ?CALCIUM 9.4 8.6* 8.6*  ?GFRNONAA 59* >60 >60  ?ANIONGAP 9 12 9   ?  ? ?Hematology ?Recent Labs  ?Lab 12/26/21 ?1443  ?WBC 10.5  ?RBC 4.31  ?HGB 13.1  ?HCT 39.8  ?MCV 92.3  ?MCH 30.4  ?MCHC 32.9  ?RDW 14.2  ?PLT 205   ? ? ?Cardiac EnzymesNo results for input(s): TROPONINI in the last 168 hours. No results for input(s): TROPIPOC in the last 168 hours.  ? ?BNPNo results for input(s): BNP, PROBNP in the last 168 hours.  ? ?DDimer No results for input(s): DDIMER in the last 168 hours.  ? ?Radiology  ?  ?MM RT RADIOACTIVE SEED LOC MAMMO GUIDE ? ?Result Date: 12/25/2021 ?CLINICAL DATA:  81 year old female with recently diagnosed right breast invasive ductal carcinoma. Patient presents for seed localization. EXAM: MAMMOGRAPHIC GUIDED RADIOACTIVE SEED LOCALIZATION OF THE RIGHT BREAST COMPARISON:  Previous exam(s). FINDINGS: Patient presents for radioactive seed localization prior to right breast lumpectomy. I met with the patient and we discussed the procedure of seed localization including benefits and alternatives. We discussed the high likelihood of a successful procedure. We discussed the risks of the procedure including infection, bleeding, tissue injury and further surgery. We discussed the low dose of radioactivity involved in the procedure. Informed, written consent was given. The usual time-out protocol was performed immediately prior to the procedure. Using mammographic guidance, sterile  technique, 1% lidocaine and an I-125 radioactive seed, the ribbon biopsy marking clip was localized using a lateral approach. The follow-up mammogram images confirm the seed in the expected location and were marked for Dr. Brantley Stage. Follow-up survey of the patient confirms presence of the radioactive seed. Order number of I-125 seed:  [id number]. Total activity: 0.239 mCi reference Date: 12/10/2021 The patient tolerated the procedure well and was released from the [facility]. She was given instructions regarding seed removal. IMPRESSION: Radioactive seed localization right breast. No apparent complications. Electronically Signed   By: Audie Pinto M.D.   On: 12/25/2021 13:48  ? ?Cardiac Studies  ? ?NA ? ?Patient Profile  ?   ?81 y.o.  female with paroxysmal atrial fibrillation on eliquis, HTN, HLD, breast cancer who is being seen 12/26/2021 for the evaluation of atrial fibrillation with RVR. ? ?Assessment & Plan  ?  ?Atrial fib with RVR:    Rate is still not controlled. On Eliquis.  She has had one dose of metoprolol in addition to the IV Cardizem.   I will start PO Cardizem 30 q6h and stop the IV Cardizem.  Start PO dig.  Continue beta blocker.  Discharge when we can get her rate relatively controlled.    ? ?Hypokalemia:  This has been supplemented.    ? ?For questions or updates, please contact [place] ?Please consult [email or web address] for contact info under Cardiology/STEMI. ?  ?Signed, ?Minus Breeding, MD  ?12/27/2021, 8:15 AM   ? ?

## 2021-12-27 NOTE — Telephone Encounter (Addendum)
Pt currently admitted; will send back to pre op as FYI ?

## 2021-12-27 NOTE — TOC Initial Note (Signed)
Transition of Care (TOC) - Initial/Assessment Note  ? ? ?Patient Details  ?Name: HAYA HEMLER ?MRN: [id number] ?Date of Birth: 01/22/1941 ? ?Transition of Care [facility]) CM/SW Contact:    ?Ninfa Meeker, RN ?Phone Number: ?12/27/2021, 1:45 PM ? ?Clinical Narrative:                 ?Transition screening Note: ? ?Transition of Care [facility]) Department has reviewed patient and no TOC needs have been identified at this time. We will continue to monitor patient advancement through Interdisciplinary progressions and if new patient needs arise, please place a consult.  ? ?  ?  ? ? ?Patient Goals and CMS Choice ?  ?  ?  ? ?Expected Discharge Plan and Services ?  ?  ?  ?  ?  ?                ?  ?  ?  ?  ?  ?  ?  ?  ?  ?  ? ?Prior Living Arrangements/Services ?  ?  ?  ?       ?  ?  ?  ?  ? ?Activities of Daily Living ?Home Assistive Devices/Equipment: Eyeglasses, Shower chair with back ?ADL Screening (condition at time of admission) ?Patient's cognitive ability adequate to safely complete daily activities?: Yes ?Is the patient deaf or have difficulty hearing?: No ?Does the patient have difficulty seeing, even when wearing glasses/contacts?: No ?Does the patient have difficulty concentrating, remembering, or making decisions?: No ?Patient able to express need for assistance with ADLs?: Yes ?Does the patient have difficulty dressing or bathing?: No ?Independently performs ADLs?: Yes (appropriate for developmental age) ?Does the patient have difficulty walking or climbing stairs?: No ?Weakness of Legs: Left (knee) ?Weakness of Arms/Hands: None ? ?Permission Sought/Granted ?  ?  ?   ?   ?   ?   ? ?Emotional Assessment ?  ?  ?  ?  ?  ?  ? ?Admission diagnosis:  Atrial fibrillation ([place]) [I48.91] ?Breast cancer, stage 1, estrogen receptor positive, right ([place]) [C50.911, Z17.0] ?Hypertension, unspecified type [I10] ?Patient Active Problem List  ? Diagnosis Date Noted  ? Atrial fibrillation ([place]) 12/26/2021  ? Family history of prostate  cancer 12/13/2021  ? Malignant neoplasm of upper-outer quadrant of right breast in female, estrogen receptor positive ([place]) 12/10/2021  ? Degenerative disc disease, cervical 03/20/2020  ? Chronic anticoagulation 07/04/2015  ? HOH (hard of hearing)   ? Hyperlipemia   ? PAF (paroxysmal atrial fibrillation) ([place]) 06/23/2015  ? HTN (hypertension)   ? ?PCP:  Lavone Orn, MD ?Pharmacy:   ?[facility] - [place], [place] - [address] ?[address] ?[place] [address] ?Phone: [phone] Fax: [phone] ? ? ? ? ?Social Determinants of Health (SDOH) Interventions ?  ? ?Readmission Risk Interventions ?No flowsheet data found. ? ? ?

## 2021-12-27 NOTE — Telephone Encounter (Signed)
Thanks for the heads up. That is another reason she will need an in-office visit before we can clear her for surgery. ? ?Thank you! ?

## 2021-12-28 ENCOUNTER — Encounter: Payer: Self-pay | Admitting: Genetic Counselor

## 2021-12-28 ENCOUNTER — Telehealth: Payer: Self-pay | Admitting: Cardiology

## 2021-12-28 DIAGNOSIS — M1990 Unspecified osteoarthritis, unspecified site: Secondary | ICD-10-CM | POA: Diagnosis present

## 2021-12-28 DIAGNOSIS — C50411 Malignant neoplasm of upper-outer quadrant of right female breast: Secondary | ICD-10-CM | POA: Diagnosis present

## 2021-12-28 DIAGNOSIS — I340 Nonrheumatic mitral (valve) insufficiency: Secondary | ICD-10-CM | POA: Diagnosis not present

## 2021-12-28 DIAGNOSIS — I10 Essential (primary) hypertension: Secondary | ICD-10-CM | POA: Diagnosis present

## 2021-12-28 DIAGNOSIS — I493 Ventricular premature depolarization: Secondary | ICD-10-CM | POA: Diagnosis present

## 2021-12-28 DIAGNOSIS — Z880 Allergy status to penicillin: Secondary | ICD-10-CM | POA: Diagnosis not present

## 2021-12-28 DIAGNOSIS — I4819 Other persistent atrial fibrillation: Secondary | ICD-10-CM | POA: Diagnosis not present

## 2021-12-28 DIAGNOSIS — Z90710 Acquired absence of both cervix and uterus: Secondary | ICD-10-CM | POA: Diagnosis not present

## 2021-12-28 DIAGNOSIS — Z79899 Other long term (current) drug therapy: Secondary | ICD-10-CM | POA: Diagnosis not present

## 2021-12-28 DIAGNOSIS — I491 Atrial premature depolarization: Secondary | ICD-10-CM | POA: Diagnosis present

## 2021-12-28 DIAGNOSIS — I081 Rheumatic disorders of both mitral and tricuspid valves: Secondary | ICD-10-CM | POA: Diagnosis present

## 2021-12-28 DIAGNOSIS — Z0000 Encounter for general adult medical examination without abnormal findings: Secondary | ICD-10-CM | POA: Insufficient documentation

## 2021-12-28 DIAGNOSIS — I429 Cardiomyopathy, unspecified: Secondary | ICD-10-CM | POA: Diagnosis present

## 2021-12-28 DIAGNOSIS — E785 Hyperlipidemia, unspecified: Secondary | ICD-10-CM | POA: Diagnosis present

## 2021-12-28 DIAGNOSIS — Z170 Estrogen receptor positive status [ER+]: Secondary | ICD-10-CM | POA: Diagnosis not present

## 2021-12-28 DIAGNOSIS — I480 Paroxysmal atrial fibrillation: Secondary | ICD-10-CM | POA: Diagnosis present

## 2021-12-28 DIAGNOSIS — G4730 Sleep apnea, unspecified: Secondary | ICD-10-CM | POA: Diagnosis present

## 2021-12-28 DIAGNOSIS — Z1379 Encounter for other screening for genetic and chromosomal anomalies: Secondary | ICD-10-CM | POA: Insufficient documentation

## 2021-12-28 DIAGNOSIS — G4733 Obstructive sleep apnea (adult) (pediatric): Secondary | ICD-10-CM | POA: Diagnosis not present

## 2021-12-28 DIAGNOSIS — Z8249 Family history of ischemic heart disease and other diseases of the circulatory system: Secondary | ICD-10-CM | POA: Diagnosis not present

## 2021-12-28 DIAGNOSIS — F419 Anxiety disorder, unspecified: Secondary | ICD-10-CM | POA: Diagnosis present

## 2021-12-28 DIAGNOSIS — I509 Heart failure, unspecified: Secondary | ICD-10-CM | POA: Diagnosis not present

## 2021-12-28 DIAGNOSIS — I110 Hypertensive heart disease with heart failure: Secondary | ICD-10-CM | POA: Diagnosis not present

## 2021-12-28 DIAGNOSIS — I4891 Unspecified atrial fibrillation: Secondary | ICD-10-CM | POA: Diagnosis present

## 2021-12-28 DIAGNOSIS — H9190 Unspecified hearing loss, unspecified ear: Secondary | ICD-10-CM | POA: Diagnosis present

## 2021-12-28 DIAGNOSIS — Z885 Allergy status to narcotic agent status: Secondary | ICD-10-CM | POA: Diagnosis not present

## 2021-12-28 DIAGNOSIS — I361 Nonrheumatic tricuspid (valve) insufficiency: Secondary | ICD-10-CM | POA: Diagnosis not present

## 2021-12-28 DIAGNOSIS — E876 Hypokalemia: Secondary | ICD-10-CM | POA: Diagnosis present

## 2021-12-28 DIAGNOSIS — Z7901 Long term (current) use of anticoagulants: Secondary | ICD-10-CM | POA: Diagnosis not present

## 2021-12-28 DIAGNOSIS — I3139 Other pericardial effusion (noninflammatory): Secondary | ICD-10-CM | POA: Diagnosis present

## 2021-12-28 LAB — BASIC METABOLIC PANEL
Anion gap: 9 (ref 5–15)
BUN: 14 mg/dL (ref 8–23)
CO2: 27 mmol/L (ref 22–32)
Calcium: 8.6 mg/dL — ABNORMAL LOW (ref 8.9–10.3)
Chloride: 102 mmol/L (ref 98–111)
Creatinine, Ser: 0.94 mg/dL (ref 0.44–1.00)
GFR, Estimated: >60 mL/min (ref >60)
Glucose, Bld: 126 mg/dL — ABNORMAL HIGH (ref 70–99)
Potassium: 3.6 mmol/L (ref 3.5–5.1)
Sodium: 138 mmol/L (ref 135–145)

## 2021-12-28 LAB — MAGNESIUM: Magnesium: 2.1 mg/dL (ref 1.7–2.4)

## 2021-12-28 MED ORDER — METOPROLOL TARTRATE 25 MG PO TABS
25.0000 mg | ORAL_TABLET | Freq: Once | ORAL | Status: AC
  Administered 2021-12-28: 25 mg via ORAL
  Filled 2021-12-28: qty 1

## 2021-12-28 MED ORDER — DILTIAZEM HCL ER COATED BEADS 120 MG PO CP24
120.0000 mg | ORAL_CAPSULE | Freq: Two times a day (BID) | ORAL | Status: DC
  Administered 2021-12-28 – 2021-12-31 (×7): 120 mg via ORAL
  Filled 2021-12-28 (×7): qty 1

## 2021-12-28 NOTE — Plan of Care (Signed)

## 2021-12-28 NOTE — Progress Notes (Signed)
Mobility Specialist Progress Note  ? ? 12/28/21 1006  ?Mobility  ?Activity Ambulated independently in hallway  ?Level of Assistance Independent  ?Assistive Device None  ?Distance Ambulated (ft) 320 ft  ?Activity Response Tolerated well  ?$Mobility charge 1 Mobility  ? ?Pt received sitting in chair and agreeable. No complaints. HR jumping from 140s-160s. Returned to room with family present.  ? ?Shannon Liu ?Mobility Specialist  ?M.S. 5N: [phone]  ?

## 2021-12-28 NOTE — Progress Notes (Signed)
? ?Progress Note ? ?Patient Name: Shannon Liu ?Date of Encounter: 12/28/2021 ? ?Primary Cardiologist:   Candee Furbish, MD ? ? ?Subjective  ? ?She does not feel her atrial fib.  No pain .  No SOB. No palpitations.   ? ?Inpatient Medications  ?  ?Scheduled Meds: ? apixaban  5 mg Oral BID  ? digoxin  0.125 mg Oral Daily  ? diltiazem  30 mg Oral Q6H  ? metoprolol tartrate  25 mg Oral BID  ? polyvinyl alcohol  1 drop Both Eyes QHS  ? potassium chloride  10 mEq Oral Daily  ? rosuvastatin  10 mg Oral Daily  ? ?Continuous Infusions: ? ? ?PRN Meds: ?acetaminophen, ALPRAZolam, ondansetron (ZOFRAN) IV  ? ?Vital Signs  ?  ?Vitals:  ? 12/27/21 2134 12/28/21 0010 12/28/21 0240 12/28/21 0558  ?BP: 126/80 111/72  (!) 114/100  ?Pulse: (!) 135 (!) 136 (!) 133 (!) 141  ?Resp:      ?Temp: 97.9 ?F (36.6 ?C) 97.9 ?F (36.6 ?C)  98.3 ?F (36.8 ?C)  ?TempSrc: Oral Oral  Oral  ?SpO2: 97% 91%  96%  ?Weight:    97.8 kg  ?Height:      ? ? ?Intake/Output Summary (Last 24 hours) at 12/28/2021 0832 ?Last data filed at 12/27/2021 2045 ?Gross per 24 hour  ?Intake 287.16 ml  ?Output --  ?Net 287.16 ml  ? ?Filed Weights  ? 12/26/21 1455 12/27/21 0340 12/28/21 0558  ?Weight: 95.3 kg 97.8 kg 97.8 kg  ? ? ?Telemetry  ?  ?Atrial fib with rapid rate. - Personally Reviewed ? ?ECG  ?  ?NA - Personally Reviewed ? ?Physical Exam  ? ?GEN: No  acute distress.   ?Neck: No  JVD ?Cardiac: Irregular RR, No murmurs, rubs, or gallops.  ?Respiratory: Clear   to auscultation bilaterally. ?GI: Soft, nontender, non-distended, normal bowel sounds  ?MS:  No edema; No deformity. ?Neuro:   Nonfocal  ?Psych: Oriented and appropriate  ? ? ?Labs  ?  ?Chemistry ?Recent Labs  ?Lab 12/26/21 ?1443 12/27/21 ?0325 12/28/21 ?0240  ?NA 138 136 138  ?K 4.0 3.1* 3.6  ?CL 102 101 102  ?CO2 '24 26 27  '$ ?GLUCOSE 113* 120* 126*  ?BUN '15 14 14  '$ ?CREATININE 0.64 0.73 0.94  ?CALCIUM 8.6* 8.6* 8.6*  ?GFRNONAA >60 >60 >60  ?ANIONGAP '12 9 9  '$ ?  ? ?Hematology ?Recent Labs  ?Lab 12/26/21 ?1443  ?WBC 10.5   ?RBC 4.31  ?HGB 13.1  ?HCT 39.8  ?MCV 92.3  ?MCH 30.4  ?MCHC 32.9  ?RDW 14.2  ?PLT 205  ? ? ?Cardiac EnzymesNo results for input(s): TROPONINI in the last 168 hours. No results for input(s): TROPIPOC in the last 168 hours.  ? ?BNPNo results for input(s): BNP, PROBNP in the last 168 hours.  ? ?DDimer No results for input(s): DDIMER in the last 168 hours.  ? ?Radiology  ?  ?ECHOCARDIOGRAM COMPLETE ? ?Result Date: 12/27/2021 ?   ECHOCARDIOGRAM REPORT   Patient Name:   Shannon Liu Date of Exam: 12/27/2021 Medical Rec #:  [id number]      Height:       67.0 in Accession #:    [id number]     Weight:       215.6 lb Date of Birth:  13-Nov-1940      BSA:          2.088 m? Patient Age:    60 years       BP:  112/71 mmHg Patient Gender: F              HR:           100 bpm. Exam Location:  Inpatient Procedure: 2D Echo, Cardiac Doppler and Color Doppler Indications:    Afib  History:        Patient has no prior history of Echocardiogram examinations.                 Arrythmias:Atrial Fibrillation; Risk Factors:Hypertension.  Sonographer:    Jyl Heinz Referring Phys: [id number] [place]  1. Left ventricular ejection fraction, by estimation, is 40 to 45%. The left ventricle has mildly decreased function. The left ventricle demonstrates global hypokinesis. The left ventricular internal cavity size was mildly dilated. There is mild left ventricular hypertrophy. Left ventricular diastolic function could not be evaluated.  2. Right ventricular systolic function is mildly reduced. The right ventricular size is mildly enlarged. There is mildly elevated pulmonary artery systolic pressure. The estimated right ventricular systolic pressure is 62.2 mmHg.  3. Left atrial size was mildly dilated.  4. Right atrial size was moderately dilated.  5. A small pericardial effusion is present.  6. The mitral valve is normal in structure. Mild mitral valve regurgitation.  7. Tricuspid valve regurgitation is mild to  moderate.  8. The aortic valve is normal in structure. Aortic valve regurgitation is not visualized. No aortic stenosis is present.  9. The inferior vena cava is dilated in size with <50% respiratory variability, suggesting right atrial pressure of 15 mmHg. FINDINGS  Left Ventricle: Left ventricular ejection fraction, by estimation, is 40 to 45%. The left ventricle has mildly decreased function. The left ventricle demonstrates global hypokinesis. The left ventricular internal cavity size was mildly dilated. There is  mild left ventricular hypertrophy. Left ventricular diastolic function could not be evaluated due to atrial fibrillation. Left ventricular diastolic function could not be evaluated. Right Ventricle: The right ventricular size is mildly enlarged. Right vetricular wall thickness was not well visualized. Right ventricular systolic function is mildly reduced. There is mildly elevated pulmonary artery systolic pressure. The tricuspid regurgitant velocity is 2.30 m/s, and with an assumed right atrial pressure of 15 mmHg, the estimated right ventricular systolic pressure is 63.3 mmHg. Left Atrium: Left atrial size was mildly dilated. Right Atrium: Right atrial size was moderately dilated. Pericardium: A small pericardial effusion is present. Mitral Valve: The mitral valve is normal in structure. Mild mitral valve regurgitation. Tricuspid Valve: The tricuspid valve is normal in structure. Tricuspid valve regurgitation is mild to moderate. Aortic Valve: The aortic valve is normal in structure. Aortic valve regurgitation is not visualized. No aortic stenosis is present. Aortic valve peak gradient measures 4.8 mmHg. Pulmonic Valve: The pulmonic valve was not well visualized. Pulmonic valve regurgitation is not visualized. Aorta: The aortic root and ascending aorta are structurally normal, with no evidence of dilitation. Venous: The inferior vena cava is dilated in size with less than 50% respiratory variability,  suggesting right atrial pressure of 15 mmHg. IAS/Shunts: The atrial septum is grossly normal.  LEFT VENTRICLE PLAX 2D LVIDd:         4.30 cm     Diastology LVIDs:         3.50 cm     LV e' medial:    7.94 cm/s LV PW:         1.30 cm     LV E/e' medial:  11.9 LV IVS:  1.20 cm     LV e' lateral:   10.60 cm/s LVOT diam:     2.00 cm     LV E/e' lateral: 8.9 LV SV:         42 LV SV Index:   20 LVOT Area:     3.14 cm?  LV Volumes (MOD) LV vol d, MOD A2C: 83.3 ml LV vol d, MOD A4C: 69.8 ml LV vol s, MOD A2C: 43.9 ml LV vol s, MOD A4C: 40.6 ml LV SV MOD A2C:     39.4 ml LV SV MOD A4C:     69.8 ml LV SV MOD BP:      33.6 ml RIGHT VENTRICLE            IVC RV Basal diam:  3.50 cm    IVC diam: 2.30 cm RV Mid diam:    2.40 cm RV S prime:     8.05 cm/s TAPSE (M-mode): 1.7 cm LEFT ATRIUM             Index        RIGHT ATRIUM           Index LA diam:        4.30 cm 2.06 cm/m?   RA Area:     22.60 cm? LA Vol (A2C):   73.0 ml 34.96 ml/m?  RA Volume:   60.50 ml  28.98 ml/m? LA Vol (A4C):   76.0 ml 36.40 ml/m? LA Biplane Vol: 81.9 ml 39.23 ml/m?  AORTIC VALVE AV Area (Vmax): 2.02 cm? AV Vmax:        110.00 cm/s AV Peak Grad:   4.8 mmHg LVOT Vmax:      70.80 cm/s LVOT Vmean:     56.600 cm/s LVOT VTI:       0.133 m  AORTA Ao Root diam: 3.10 cm Ao Asc diam:  3.50 cm MITRAL VALVE               TRICUSPID VALVE MV Area (PHT): 3.99 cm?    TR Peak grad:   21.2 mmHg MV Decel Time: 190 msec    TR Vmax:        230.00 cm/s MV E velocity: 94.30 cm/s MV A velocity: 26.70 cm/s  SHUNTS MV E/A ratio:  3.53        Systemic VTI:  0.13 m                            Systemic Diam: 2.00 cm Mertie Moores MD Electronically signed by Mertie Moores MD Signature Date/Time: 12/27/2021/2:28:42 PM    Final    ? ?Cardiac Studies  ? ?NA ? ?Patient Profile  ?   ?81 y.o. female with paroxysmal atrial fibrillation on eliquis, HTN, HLD, breast cancer who is being seen 12/26/2021 for the evaluation of atrial fibrillation with RVR. ? ?Assessment & Plan  ?  ?Atrial fib  with RVR:    Difficult.  I am trying to get her to breast surgery next week with rate control.  If we have to cardiovert her she cannot have this for 3 - 4 weeks because of anticoagulation and will need to h

## 2021-12-28 NOTE — Telephone Encounter (Signed)
? ?  Pt is requesting to speak with Pam, she said, she's been in the hospital since Wednesday trying to control her Afib. She is also asking if Dr. Gillian Shields can visit her there. She said, Pam can call her on her mobile# [phone] ?

## 2021-12-28 NOTE — Telephone Encounter (Signed)
Spoke with pt regarding her current concerns r/t At Fib and heart rate control.  She is presenting in the hospital being treated for At Fib with RVR.  She had been holding Eliquis since Sunday for a lumpectomy on Wednesday.  Due to this, she is not able to have a cardioversion at this time.  At Fib with RVR noted prior to surgery and pt was transported to [facility] for treatment.  She reports initially her HR was between 150-160 bpm and now is running around 120 bpm.  Medication adjustments made this AM seem to be helping.   Pt also reports she was told this AM she will stay through the weekend for HR management.  She has been rescheduled for her surgery on Wednesday 3/22.  Reassurance given to pt that current treatment is the best and most appropriate.  She states appreciation for the call and information.   ?

## 2021-12-29 DIAGNOSIS — I4891 Unspecified atrial fibrillation: Secondary | ICD-10-CM

## 2021-12-29 MED ORDER — METOPROLOL TARTRATE 25 MG PO TABS
25.0000 mg | ORAL_TABLET | Freq: Four times a day (QID) | ORAL | Status: DC
  Administered 2021-12-29 – 2021-12-31 (×7): 25 mg via ORAL
  Filled 2021-12-29 (×7): qty 1

## 2021-12-29 NOTE — Progress Notes (Signed)
? ?Progress Note ? ?Patient Name: Shannon Liu ?Date of Encounter: 12/29/2021 ? ?Primary Cardiologist:   Candee Furbish, MD ? ? ?Subjective  ? ?Denies pain or SOB.  ? ?Inpatient Medications  ?  ?Scheduled Meds: ? apixaban  5 mg Oral BID  ? digoxin  0.125 mg Oral Daily  ? diltiazem  120 mg Oral BID  ? metoprolol tartrate  25 mg Oral BID  ? polyvinyl alcohol  1 drop Both Eyes QHS  ? potassium chloride  10 mEq Oral Daily  ? rosuvastatin  10 mg Oral Daily  ? ?Continuous Infusions: ? ? ?PRN Meds: ?acetaminophen, ALPRAZolam, ondansetron (ZOFRAN) IV  ? ?Vital Signs  ?  ?Vitals:  ? 12/28/21 2101 12/29/21 0000 12/29/21 7673 12/29/21 0836  ?BP:  110/76 (!) 117/93 111/67  ?Pulse:  (!) 118 (!) 109 95  ?Resp: 18 (!) '22 19 14  '$ ?Temp: 97.7 ?F (36.5 ?C) (!) 97.3 ?F (36.3 ?C) 98 ?F (36.7 ?C) 97.6 ?F (36.4 ?C)  ?TempSrc: Oral  Oral Axillary  ?SpO2: 98% 96% 95% 95%  ?Weight:      ?Height:      ? ?No intake or output data in the 24 hours ending 12/29/21 1043 ? ?Filed Weights  ? 12/26/21 1455 12/27/21 0340 12/28/21 0558  ?Weight: 95.3 kg 97.8 kg 97.8 kg  ? ? ?Telemetry  ?  ?Atrial fib with rapid rate.  - Personally Reviewed ? ?ECG  ?  ?NA - Personally Reviewed ? ?Physical Exam  ? ? ?GEN: No  acute distress.   ?Neck: No  JVD ?Cardiac: Irregular RR, no9 murmurs, rubs, or gallops.  ?Respiratory: Clear   to auscultation bilaterally. ?GI: Soft, nontender, non-distended, normal bowel sounds  ?MS:  No  edema; No deformity. ?Neuro:   Nonfocal  ?Psych: Oriented and appropriate  ? ? ?Labs  ?  ?Chemistry ?Recent Labs  ?Lab 12/26/21 ?1443 12/27/21 ?0325 12/28/21 ?0240  ?NA 138 136 138  ?K 4.0 3.1* 3.6  ?CL 102 101 102  ?CO2 '24 26 27  '$ ?GLUCOSE 113* 120* 126*  ?BUN '15 14 14  '$ ?CREATININE 0.64 0.73 0.94  ?CALCIUM 8.6* 8.6* 8.6*  ?GFRNONAA >60 >60 >60  ?ANIONGAP '12 9 9  '$ ?  ? ?Hematology ?Recent Labs  ?Lab 12/26/21 ?1443  ?WBC 10.5  ?RBC 4.31  ?HGB 13.1  ?HCT 39.8  ?MCV 92.3  ?MCH 30.4  ?MCHC 32.9  ?RDW 14.2  ?PLT 205  ? ? ?Cardiac EnzymesNo results for  input(s): TROPONINI in the last 168 hours. No results for input(s): TROPIPOC in the last 168 hours.  ? ?BNPNo results for input(s): BNP, PROBNP in the last 168 hours.  ? ?DDimer No results for input(s): DDIMER in the last 168 hours.  ? ?Radiology  ?  ?ECHOCARDIOGRAM COMPLETE ? ?Result Date: 12/27/2021 ?   ECHOCARDIOGRAM REPORT   Patient Name:   Shannon Liu Date of Exam: 12/27/2021 Medical Rec #:  [id number]      Height:       67.0 in Accession #:    [id number]     Weight:       215.6 lb Date of Birth:  October 25, 1940      BSA:          2.088 m? Patient Age:    81 years       BP:           112/71 mmHg Patient Gender: F              HR:  100 bpm. Exam Location:  Inpatient Procedure: 2D Echo, Cardiac Doppler and Color Doppler Indications:    Afib  History:        Patient has no prior history of Echocardiogram examinations.                 Arrythmias:Atrial Fibrillation; Risk Factors:Hypertension.  Sonographer:    Jyl Heinz Referring Phys: [id number] [place]  1. Left ventricular ejection fraction, by estimation, is 40 to 45%. The left ventricle has mildly decreased function. The left ventricle demonstrates global hypokinesis. The left ventricular internal cavity size was mildly dilated. There is mild left ventricular hypertrophy. Left ventricular diastolic function could not be evaluated.  2. Right ventricular systolic function is mildly reduced. The right ventricular size is mildly enlarged. There is mildly elevated pulmonary artery systolic pressure. The estimated right ventricular systolic pressure is 81.0 mmHg.  3. Left atrial size was mildly dilated.  4. Right atrial size was moderately dilated.  5. A small pericardial effusion is present.  6. The mitral valve is normal in structure. Mild mitral valve regurgitation.  7. Tricuspid valve regurgitation is mild to moderate.  8. The aortic valve is normal in structure. Aortic valve regurgitation is not visualized. No aortic stenosis is  present.  9. The inferior vena cava is dilated in size with <50% respiratory variability, suggesting right atrial pressure of 15 mmHg. FINDINGS  Left Ventricle: Left ventricular ejection fraction, by estimation, is 40 to 45%. The left ventricle has mildly decreased function. The left ventricle demonstrates global hypokinesis. The left ventricular internal cavity size was mildly dilated. There is  mild left ventricular hypertrophy. Left ventricular diastolic function could not be evaluated due to atrial fibrillation. Left ventricular diastolic function could not be evaluated. Right Ventricle: The right ventricular size is mildly enlarged. Right vetricular wall thickness was not well visualized. Right ventricular systolic function is mildly reduced. There is mildly elevated pulmonary artery systolic pressure. The tricuspid regurgitant velocity is 2.30 m/s, and with an assumed right atrial pressure of 15 mmHg, the estimated right ventricular systolic pressure is 17.5 mmHg. Left Atrium: Left atrial size was mildly dilated. Right Atrium: Right atrial size was moderately dilated. Pericardium: A small pericardial effusion is present. Mitral Valve: The mitral valve is normal in structure. Mild mitral valve regurgitation. Tricuspid Valve: The tricuspid valve is normal in structure. Tricuspid valve regurgitation is mild to moderate. Aortic Valve: The aortic valve is normal in structure. Aortic valve regurgitation is not visualized. No aortic stenosis is present. Aortic valve peak gradient measures 4.8 mmHg. Pulmonic Valve: The pulmonic valve was not well visualized. Pulmonic valve regurgitation is not visualized. Aorta: The aortic root and ascending aorta are structurally normal, with no evidence of dilitation. Venous: The inferior vena cava is dilated in size with less than 50% respiratory variability, suggesting right atrial pressure of 15 mmHg. IAS/Shunts: The atrial septum is grossly normal.  LEFT VENTRICLE PLAX 2D LVIDd:          4.30 cm     Diastology LVIDs:         3.50 cm     LV e' medial:    7.94 cm/s LV PW:         1.30 cm     LV E/e' medial:  11.9 LV IVS:        1.20 cm     LV e' lateral:   10.60 cm/s LVOT diam:     2.00 cm     LV E/e'  lateral: 8.9 LV SV:         42 LV SV Index:   20 LVOT Area:     3.14 cm?  LV Volumes (MOD) LV vol d, MOD A2C: 83.3 ml LV vol d, MOD A4C: 69.8 ml LV vol s, MOD A2C: 43.9 ml LV vol s, MOD A4C: 40.6 ml LV SV MOD A2C:     39.4 ml LV SV MOD A4C:     69.8 ml LV SV MOD BP:      33.6 ml RIGHT VENTRICLE            IVC RV Basal diam:  3.50 cm    IVC diam: 2.30 cm RV Mid diam:    2.40 cm RV S prime:     8.05 cm/s TAPSE (M-mode): 1.7 cm LEFT ATRIUM             Index        RIGHT ATRIUM           Index LA diam:        4.30 cm 2.06 cm/m?   RA Area:     22.60 cm? LA Vol (A2C):   73.0 ml 34.96 ml/m?  RA Volume:   60.50 ml  28.98 ml/m? LA Vol (A4C):   76.0 ml 36.40 ml/m? LA Biplane Vol: 81.9 ml 39.23 ml/m?  AORTIC VALVE AV Area (Vmax): 2.02 cm? AV Vmax:        110.00 cm/s AV Peak Grad:   4.8 mmHg LVOT Vmax:      70.80 cm/s LVOT Vmean:     56.600 cm/s LVOT VTI:       0.133 m  AORTA Ao Root diam: 3.10 cm Ao Asc diam:  3.50 cm MITRAL VALVE               TRICUSPID VALVE MV Area (PHT): 3.99 cm?    TR Peak grad:   21.2 mmHg MV Decel Time: 190 msec    TR Vmax:        230.00 cm/s MV E velocity: 94.30 cm/s MV A velocity: 26.70 cm/s  SHUNTS MV E/A ratio:  3.53        Systemic VTI:  0.13 m                            Systemic Diam: 2.00 cm Mertie Moores MD Electronically signed by Mertie Moores MD Signature Date/Time: 12/27/2021/2:28:42 PM    Final    ? ?Cardiac Studies  ? ?ECHO: ? ? 1. Left ventricular ejection fraction, by estimation, is 40 to 45%. The  ?left ventricle has mildly decreased function. The left ventricle  ?demonstrates global hypokinesis. The left ventricular internal cavity size  ?was mildly dilated. There is mild left  ?ventricular hypertrophy. Left ventricular diastolic function could not be  ?evaluated.  ?  2. Right ventricular systolic function is mildly reduced. The right  ?ventricular size is mildly enlarged. There is mildly elevated pulmonary  ?artery systolic pressure. The estimated right ventricular syst

## 2021-12-30 DIAGNOSIS — I4891 Unspecified atrial fibrillation: Secondary | ICD-10-CM | POA: Diagnosis not present

## 2021-12-30 LAB — PROTIME-INR
INR: 1.3 — ABNORMAL HIGH (ref 0.8–1.2)
Prothrombin Time: 16.6 seconds — ABNORMAL HIGH (ref 11.4–15.2)

## 2021-12-30 MED ORDER — SODIUM CHLORIDE 0.9 % IV SOLN: INTRAVENOUS | Status: DC

## 2021-12-30 MED ORDER — POTASSIUM CHLORIDE CRYS ER 20 MEQ PO TBCR
40.0000 meq | EXTENDED_RELEASE_TABLET | Freq: Once | ORAL | Status: AC
  Administered 2021-12-30: 40 meq via ORAL
  Filled 2021-12-30: qty 2

## 2021-12-30 MED ORDER — ALUM & MAG HYDROXIDE-SIMETH 200-200-20 MG/5ML PO SUSP: 30.0000 mL | Freq: Four times a day (QID) | ORAL | Status: DC | PRN

## 2021-12-30 NOTE — Progress Notes (Signed)
? ?Progress Note ? ?Patient Name: Shannon Liu ?Date of Encounter: 12/30/2021 ? ?Primary Cardiologist:   Candee Furbish, MD ? ? ?Subjective  ? ?No chest pain.  No SOB.  ? ?Inpatient Medications  ?  ?Scheduled Meds: ? apixaban  5 mg Oral BID  ? digoxin  0.125 mg Oral Daily  ? diltiazem  120 mg Oral BID  ? metoprolol tartrate  25 mg Oral QID  ? polyvinyl alcohol  1 drop Both Eyes QHS  ? potassium chloride  10 mEq Oral Daily  ? rosuvastatin  10 mg Oral Daily  ? ?Continuous Infusions: ? ?PRN Meds: ?acetaminophen, ALPRAZolam, ondansetron (ZOFRAN) IV  ? ?Vital Signs  ?  ?Vitals:  ? 12/29/21 2340 12/30/21 0043 12/30/21 0500 12/30/21 1202  ?BP:  (!) 119/94  (!) 114/92  ?Pulse: (!) 126 (!) 116  83  ?Resp: '18 18  18  '$ ?Temp:  98 ?F (36.7 ?C)  98.6 ?F (37 ?C)  ?TempSrc:  Oral  Oral  ?SpO2:  96%  96%  ?Weight:   98.5 kg   ?Height:      ? ?No intake or output data in the 24 hours ending 12/30/21 1239 ?Filed Weights  ? 12/27/21 0340 12/28/21 0558 12/30/21 0500  ?Weight: 97.8 kg 97.8 kg 98.5 kg  ? ? ?Telemetry  ?  ?Atrial fib with rapid rate - Personally Reviewed ? ?ECG  ?  ?NA - Personally Reviewed ? ?Physical Exam  ? ?GEN: No acute distress.   ?Neck: No  JVD ?Cardiac: Irregular RR, no systolic murmurs, rubs, or gallops.  ?Respiratory: Clear  to auscultation bilaterally. ?GI: Soft, nontender, non-distended  ?MS: No  edema; No deformity. ?Neuro:  Nonfocal  ?Psych: Normal affect  ? ?Labs  ?  ?Chemistry ?Recent Labs  ?Lab 12/26/21 ?1443 12/27/21 ?0325 12/28/21 ?0240  ?NA 138 136 138  ?K 4.0 3.1* 3.6  ?CL 102 101 102  ?CO2 '24 26 27  '$ ?GLUCOSE 113* 120* 126*  ?BUN '15 14 14  '$ ?CREATININE 0.64 0.73 0.94  ?CALCIUM 8.6* 8.6* 8.6*  ?GFRNONAA >60 >60 >60  ?ANIONGAP '12 9 9  '$ ?  ? ?Hematology ?Recent Labs  ?Lab 12/26/21 ?1443  ?WBC 10.5  ?RBC 4.31  ?HGB 13.1  ?HCT 39.8  ?MCV 92.3  ?MCH 30.4  ?MCHC 32.9  ?RDW 14.2  ?PLT 205  ? ? ?Cardiac EnzymesNo results for input(s): TROPONINI in the last 168 hours. No results for input(s): TROPIPOC in the last  168 hours.  ? ?BNPNo results for input(s): BNP, PROBNP in the last 168 hours.  ? ?DDimer No results for input(s): DDIMER in the last 168 hours.  ? ?Radiology  ?  ?No results found. ? ?Cardiac Studies  ? ?ECHO: ? ?1. Left ventricular ejection fraction, by estimation, is 40 to 45%. The  ?left ventricle has mildly decreased function. The left ventricle  ?demonstrates global hypokinesis. The left ventricular internal cavity size  ?was mildly dilated. There is mild left  ?ventricular hypertrophy. Left ventricular diastolic function could not be  ?evaluated.  ? 2. Right ventricular systolic function is mildly reduced. The right  ?ventricular size is mildly enlarged. There is mildly elevated pulmonary  ?artery systolic pressure. The estimated right ventricular systolic  ?pressure is 82.7 mmHg.  ? 3. Left atrial size was mildly dilated.  ? 4. Right atrial size was moderately dilated.  ? 5. A small pericardial effusion is present.  ? 6. The mitral valve is normal in structure. Mild mitral valve  ?regurgitation.  ? 7.  Tricuspid valve regurgitation is mild to moderate.  ? 8. The aortic valve is normal in structure. Aortic valve regurgitation is  ?not visualized. No aortic stenosis is present.  ? 9. The inferior vena cava is dilated in size with <50% respiratory  ?variability, suggesting right atrial pressure of 15 mmHg.  ? ?Patient Profile  ?   ?81 y.o. female with paroxysmal atrial fibrillation on eliquis, HTN, HLD, breast cancer who is being seen 12/26/2021 for the evaluation of atrial fibrillation with RVR. ?  ? ?Assessment & Plan  ?  ?Atrial fib with RVR:    I have been unable to rate control.  TEE/DCCV in the AM.   Discharge on consolidated beta blocker (pending her sinus HR) and current dose of Cardizem  ?  ?Hypokalemia:  This has been supplemented.   Give additional 40 mg Kdur today.  ?  ?Cardiomyopathy:    EF is 45 - 50%.  Would need either amiodarone or Tikosyn if we pursue meds for atrial fib.  I might suggest  ablation as a first line strategy for long term management .   EF has been reduced on previous echo and known to Dr. Marlou Porch.  ? ?For questions or updates, please contact [place] ?Please consult [email or web address] for contact info under Cardiology/STEMI. ?  ?Signed, ?Minus Breeding, MD  ?12/30/2021, 12:39 PM   ? ?

## 2021-12-30 NOTE — H&P (View-Only) (Signed)
? ?Progress Note ? ?Patient Name: Shannon Liu ?Date of Encounter: 12/30/2021 ? ?Primary Cardiologist:   Candee Furbish, MD ? ? ?Subjective  ? ?No chest pain.  No SOB.  ? ?Inpatient Medications  ?  ?Scheduled Meds: ? apixaban  5 mg Oral BID  ? digoxin  0.125 mg Oral Daily  ? diltiazem  120 mg Oral BID  ? metoprolol tartrate  25 mg Oral QID  ? polyvinyl alcohol  1 drop Both Eyes QHS  ? potassium chloride  10 mEq Oral Daily  ? rosuvastatin  10 mg Oral Daily  ? ?Continuous Infusions: ? ?PRN Meds: ?acetaminophen, ALPRAZolam, ondansetron (ZOFRAN) IV  ? ?Vital Signs  ?  ?Vitals:  ? 12/29/21 2340 12/30/21 0043 12/30/21 0500 12/30/21 1202  ?BP:  (!) 119/94  (!) 114/92  ?Pulse: (!) 126 (!) 116  83  ?Resp: '18 18  18  '$ ?Temp:  98 ?F (36.7 ?C)  98.6 ?F (37 ?C)  ?TempSrc:  Oral  Oral  ?SpO2:  96%  96%  ?Weight:   98.5 kg   ?Height:      ? ?No intake or output data in the 24 hours ending 12/30/21 1239 ?Filed Weights  ? 12/27/21 0340 12/28/21 0558 12/30/21 0500  ?Weight: 97.8 kg 97.8 kg 98.5 kg  ? ? ?Telemetry  ?  ?Atrial fib with rapid rate - Personally Reviewed ? ?ECG  ?  ?NA - Personally Reviewed ? ?Physical Exam  ? ?GEN: No acute distress.   ?Neck: No  JVD ?Cardiac: Irregular RR, no systolic murmurs, rubs, or gallops.  ?Respiratory: Clear  to auscultation bilaterally. ?GI: Soft, nontender, non-distended  ?MS: No  edema; No deformity. ?Neuro:  Nonfocal  ?Psych: Normal affect  ? ?Labs  ?  ?Chemistry ?Recent Labs  ?Lab 12/26/21 ?1443 12/27/21 ?0325 12/28/21 ?0240  ?NA 138 136 138  ?K 4.0 3.1* 3.6  ?CL 102 101 102  ?CO2 '24 26 27  '$ ?GLUCOSE 113* 120* 126*  ?BUN '15 14 14  '$ ?CREATININE 0.64 0.73 0.94  ?CALCIUM 8.6* 8.6* 8.6*  ?GFRNONAA >60 >60 >60  ?ANIONGAP '12 9 9  '$ ?  ? ?Hematology ?Recent Labs  ?Lab 12/26/21 ?1443  ?WBC 10.5  ?RBC 4.31  ?HGB 13.1  ?HCT 39.8  ?MCV 92.3  ?MCH 30.4  ?MCHC 32.9  ?RDW 14.2  ?PLT 205  ? ? ?Cardiac EnzymesNo results for input(s): TROPONINI in the last 168 hours. No results for input(s): TROPIPOC in the last  168 hours.  ? ?BNPNo results for input(s): BNP, PROBNP in the last 168 hours.  ? ?DDimer No results for input(s): DDIMER in the last 168 hours.  ? ?Radiology  ?  ?No results found. ? ?Cardiac Studies  ? ?ECHO: ? ?1. Left ventricular ejection fraction, by estimation, is 40 to 45%. The  ?left ventricle has mildly decreased function. The left ventricle  ?demonstrates global hypokinesis. The left ventricular internal cavity size  ?was mildly dilated. There is mild left  ?ventricular hypertrophy. Left ventricular diastolic function could not be  ?evaluated.  ? 2. Right ventricular systolic function is mildly reduced. The right  ?ventricular size is mildly enlarged. There is mildly elevated pulmonary  ?artery systolic pressure. The estimated right ventricular systolic  ?pressure is 73.4 mmHg.  ? 3. Left atrial size was mildly dilated.  ? 4. Right atrial size was moderately dilated.  ? 5. A small pericardial effusion is present.  ? 6. The mitral valve is normal in structure. Mild mitral valve  ?regurgitation.  ? 7.  Tricuspid valve regurgitation is mild to moderate.  ? 8. The aortic valve is normal in structure. Aortic valve regurgitation is  ?not visualized. No aortic stenosis is present.  ? 9. The inferior vena cava is dilated in size with <50% respiratory  ?variability, suggesting right atrial pressure of 15 mmHg.  ? ?Patient Profile  ?   ?81 y.o. female with paroxysmal atrial fibrillation on eliquis, HTN, HLD, breast cancer who is being seen 12/26/2021 for the evaluation of atrial fibrillation with RVR. ?  ? ?Assessment & Plan  ?  ?Atrial fib with RVR:    I have been unable to rate control.  TEE/DCCV in the AM.   Discharge on consolidated beta blocker (pending her sinus HR) and current dose of Cardizem  ?  ?Hypokalemia:  This has been supplemented.   Give additional 40 mg Kdur today.  ?  ?Cardiomyopathy:    EF is 45 - 50%.  Would need either amiodarone or Tikosyn if we pursue meds for atrial fib.  I might suggest  ablation as a first line strategy for long term management .   EF has been reduced on previous echo and known to Dr. Marlou Porch.  ? ?For questions or updates, please contact [place] ?Please consult [email or web address] for contact info under Cardiology/STEMI. ?  ?Signed, ?Minus Breeding, MD  ?12/30/2021, 12:39 PM   ? ?

## 2021-12-31 ENCOUNTER — Ambulatory Visit: Payer: Self-pay | Admitting: Surgery

## 2021-12-31 ENCOUNTER — Encounter: Payer: Self-pay | Admitting: *Deleted

## 2021-12-31 ENCOUNTER — Encounter (HOSPITAL_COMMUNITY): Payer: Self-pay | Admitting: Cardiology

## 2021-12-31 ENCOUNTER — Inpatient Hospital Stay (HOSPITAL_COMMUNITY): Payer: Medicare PPO | Admitting: Anesthesiology

## 2021-12-31 ENCOUNTER — Inpatient Hospital Stay (HOSPITAL_COMMUNITY): Payer: Medicare PPO

## 2021-12-31 ENCOUNTER — Encounter (HOSPITAL_COMMUNITY)
Admission: RE | Disposition: A | Discharge status: Home / Self Care | Payer: Self-pay | Source: Ambulatory Visit | Attending: Cardiology

## 2021-12-31 DIAGNOSIS — I4891 Unspecified atrial fibrillation: Secondary | ICD-10-CM

## 2021-12-31 DIAGNOSIS — I340 Nonrheumatic mitral (valve) insufficiency: Secondary | ICD-10-CM

## 2021-12-31 DIAGNOSIS — C50911 Malignant neoplasm of unspecified site of right female breast: Secondary | ICD-10-CM

## 2021-12-31 DIAGNOSIS — I361 Nonrheumatic tricuspid (valve) insufficiency: Secondary | ICD-10-CM | POA: Diagnosis not present

## 2021-12-31 DIAGNOSIS — C50919 Malignant neoplasm of unspecified site of unspecified female breast: Secondary | ICD-10-CM

## 2021-12-31 DIAGNOSIS — G4733 Obstructive sleep apnea (adult) (pediatric): Secondary | ICD-10-CM

## 2021-12-31 DIAGNOSIS — I509 Heart failure, unspecified: Secondary | ICD-10-CM

## 2021-12-31 DIAGNOSIS — I4819 Other persistent atrial fibrillation: Secondary | ICD-10-CM | POA: Diagnosis not present

## 2021-12-31 DIAGNOSIS — I3139 Other pericardial effusion (noninflammatory): Secondary | ICD-10-CM | POA: Diagnosis not present

## 2021-12-31 DIAGNOSIS — I071 Rheumatic tricuspid insufficiency: Secondary | ICD-10-CM

## 2021-12-31 DIAGNOSIS — I110 Hypertensive heart disease with heart failure: Secondary | ICD-10-CM

## 2021-12-31 DIAGNOSIS — I519 Heart disease, unspecified: Secondary | ICD-10-CM

## 2021-12-31 HISTORY — PX: CARDIOVERSION: SHX1299

## 2021-12-31 HISTORY — PX: TEE WITHOUT CARDIOVERSION: SHX5443

## 2021-12-31 LAB — CBC WITH DIFFERENTIAL/PLATELET
Abs Immature Granulocytes: 0.03 10*3/uL (ref 0.00–0.07)
Basophils Absolute: 0.1 10*3/uL (ref 0.0–0.1)
Basophils Relative: 1 %
Eosinophils Absolute: 0.2 10*3/uL (ref 0.0–0.5)
Eosinophils Relative: 2 %
HCT: 36.5 % (ref 36.0–46.0)
Hemoglobin: 12.3 g/dL (ref 12.0–15.0)
Immature Granulocytes: 0 %
Lymphocytes Relative: 24 %
Lymphs Abs: 2.2 10*3/uL (ref 0.7–4.0)
MCH: 30.8 pg (ref 26.0–34.0)
MCHC: 33.7 g/dL (ref 30.0–36.0)
MCV: 91.3 fL (ref 80.0–100.0)
Monocytes Absolute: 1 10*3/uL (ref 0.1–1.0)
Monocytes Relative: 11 %
Neutro Abs: 5.7 10*3/uL (ref 1.7–7.7)
Neutrophils Relative %: 62 %
Platelets: 201 10*3/uL (ref 150–400)
RBC: 4 MIL/uL (ref 3.87–5.11)
RDW: 13.9 % (ref 11.5–15.5)
WBC: 9.2 10*3/uL (ref 4.0–10.5)
nRBC: 0 % (ref 0.0–0.2)

## 2021-12-31 LAB — BASIC METABOLIC PANEL
Anion gap: 9 (ref 5–15)
BUN: 17 mg/dL (ref 8–23)
CO2: 25 mmol/L (ref 22–32)
Calcium: 8.6 mg/dL — ABNORMAL LOW (ref 8.9–10.3)
Chloride: 104 mmol/L (ref 98–111)
Creatinine, Ser: 0.74 mg/dL (ref 0.44–1.00)
GFR, Estimated: >60 mL/min (ref >60)
Glucose, Bld: 117 mg/dL — ABNORMAL HIGH (ref 70–99)
Potassium: 3.8 mmol/L (ref 3.5–5.1)
Sodium: 138 mmol/L (ref 135–145)

## 2021-12-31 SURGERY — ECHOCARDIOGRAM, TRANSESOPHAGEAL
Anesthesia: General

## 2021-12-31 MED ORDER — METOPROLOL SUCCINATE ER 50 MG PO TB24
50.0000 mg | ORAL_TABLET | Freq: Every day | ORAL | Status: DC
  Administered 2021-12-31: 50 mg via ORAL
  Filled 2021-12-31: qty 1

## 2021-12-31 MED ORDER — LIDOCAINE 2% (20 MG/ML) 5 ML SYRINGE
INTRAMUSCULAR | Status: DC | PRN
  Administered 2021-12-31: 100 mg via INTRAVENOUS

## 2021-12-31 MED ORDER — PHENYLEPHRINE 40 MCG/ML (10ML) SYRINGE FOR IV PUSH (FOR BLOOD PRESSURE SUPPORT)
PREFILLED_SYRINGE | INTRAVENOUS | Status: DC | PRN
  Administered 2021-12-31 (×2): 120 ug via INTRAVENOUS

## 2021-12-31 MED ORDER — PROPOFOL 10 MG/ML IV BOLUS
INTRAVENOUS | Status: DC | PRN
  Administered 2021-12-31: 20 mg via INTRAVENOUS

## 2021-12-31 MED ORDER — PROPOFOL 500 MG/50ML IV EMUL
INTRAVENOUS | Status: DC | PRN
  Administered 2021-12-31: 100 ug/kg/min via INTRAVENOUS

## 2021-12-31 MED ORDER — EPHEDRINE SULFATE-NACL 50-0.9 MG/10ML-% IV SOSY
PREFILLED_SYRINGE | INTRAVENOUS | Status: DC | PRN
  Administered 2021-12-31: 10 mg via INTRAVENOUS

## 2021-12-31 MED ORDER — METOPROLOL SUCCINATE ER 50 MG PO TB24: 50.0000 mg | ORAL_TABLET | Freq: Every day | ORAL | 5 refills | Status: DC

## 2021-12-31 NOTE — CV Procedure (Signed)
? ?  TRANSESOPHAGEAL ECHOCARDIOGRAM GUIDED DIRECT CURRENT CARDIOVERSION ? ?NAME:  Shannon Liu   MRN: [id number] ?DOB:  06/28/1941   ADMIT DATE: 12/26/2021 ? ?INDICATIONS: ?Symptomatic atrial fibrillation ? ?PROCEDURE:  ? ?Informed consent was obtained prior to the procedure. The risks, benefits and alternatives for the procedure were discussed and the patient comprehended these risks.  Risks include, but are not limited to, cough, sore throat, vomiting, nausea, somnolence, esophageal and stomach trauma or perforation, bleeding, low blood pressure, aspiration, pneumonia, infection, trauma to the teeth and death.   ? ?After a procedural time-out, the oropharynx was anesthetized and the patient was sedated by the anesthesia service. The transesophageal probe was inserted in the esophagus and stomach without difficulty and multiple views were obtained. Anesthesia was monitored by Dr. Doroteo Glassman and Erick Colace, CRNA.  ? ?COMPLICATIONS:   ? ?Complications: No complications ?Patient tolerated procedure well. ? ?FINDINGS: ? ?No LAA thrombus.  Mild to moderate LV systolic dysfunction. Moderate TR. ? ? ?CARDIOVERSION:    ? ?Indications:  Symptomatic Atrial Fibrillation ? ?Procedure Details: ? ?Once the TEE was complete, the patient had the defibrillator pads placed in the anterior and posterior position. Once an appropriate level of sedation was confirmed, the patient was cardioverted x 1 with 200J of biphasic synchronized energy.  The patient converted to NSR with rate 50s.  There were no apparent complications.  The patient had normal neuro status and respiratory status post procedure with vitals stable as recorded elsewhere.  Adequate airway was maintained throughout and vital signs monitored per protocol. ? ?Oswaldo Milian MD ?[facility]  ?[address], [address] ?[place], [place] [address] ?[phone]  ? ?11:14 AM ? ? ? ? ?

## 2021-12-31 NOTE — Interval H&P Note (Signed)
History and Physical Interval Note: ? ?12/31/2021 ?10:53 AM ? ?Shannon Liu  has presented today for surgery, with the diagnosis of AFIB.  The various methods of treatment have been discussed with the patient and family. After consideration of risks, benefits and other options for treatment, the patient has consented to  Procedure(s): ?TRANSESOPHAGEAL ECHOCARDIOGRAM (TEE) (N/A) ?CARDIOVERSION (N/A) as a surgical intervention.  The patient's history has been reviewed, patient examined, no change in status, stable for surgery.  I have reviewed the patient's chart and labs.  Questions were answered to the patient's satisfaction.   ? ? ?Donato Heinz ? ? ?

## 2021-12-31 NOTE — Anesthesia Postprocedure Evaluation (Signed)
Anesthesia Post Note ? ?Patient: Shannon Liu ? ?Procedure(s) Performed: TRANSESOPHAGEAL ECHOCARDIOGRAM (TEE) ?CARDIOVERSION ? ?  ? ?Patient location during evaluation: PACU ?Anesthesia Type: General ?Level of consciousness: awake and alert, oriented and patient cooperative ?Pain management: pain level controlled ?Vital Signs Assessment: post-procedure vital signs reviewed and stable ?Respiratory status: spontaneous breathing, nonlabored ventilation and respiratory function stable ?Cardiovascular status: blood pressure returned to baseline and stable ?Postop Assessment: no apparent nausea or vomiting ?Anesthetic complications: no ? ? ?No notable events documented. ? ?Last Vitals:  ?Vitals:  ? 12/31/21 1137 12/31/21 1158  ?BP: 116/61 132/88  ?Pulse:  (!) 58  ?Resp: (!) 21 16  ?Temp:  36.6 ?C  ?SpO2: 95% 97%  ?  ?Last Pain:  ?Vitals:  ? 12/31/21 1158  ?TempSrc: Oral  ?PainSc: 0-No pain  ? ? ?  ?  ?  ?  ?  ?  ? ?Jarome Matin Grantley Savage ? ? ? ? ?

## 2021-12-31 NOTE — Progress Notes (Signed)
?  Echocardiogram ?Echocardiogram Transesophageal has been performed. ? ?Bobbye Charleston ?12/31/2021, 11:31 AM ?

## 2021-12-31 NOTE — Anesthesia Preprocedure Evaluation (Addendum)
Anesthesia Evaluation  ?Patient identified by MRN, date of birth, ID band ?Patient awake ? ? ? ?Reviewed: ?Allergy & Precautions, NPO status , Patient's Chart, lab work & pertinent test results ? ?Airway ?Mallampati: III ? ?TM Distance: >3 FB ?Neck ROM: Full ? ? ? Dental ? ?(+) Poor Dentition, Dental Advisory Given,  ?  ?Pulmonary ?sleep apnea and Continuous Positive Airway Pressure Ventilation ,  ?  ?Pulmonary exam normal ?breath sounds clear to auscultation ? ? ? ? ? ? Cardiovascular ?hypertension, Pt. on medications ?+CHF (LVEF 40-45%)  ?+ dysrhythmias (eliquis) Atrial Fibrillation + Valvular Problems/Murmurs (mild MR, mod TR) MR  ?Rhythm:Irregular Rate:Tachycardia ? ?Echo 12/27/21: ??1. Left ventricular ejection fraction, by estimation, is 40 to 45%. The  ?left ventricle has mildly decreased function. The left ventricle  ?demonstrates global hypokinesis. The left ventricular internal cavity size  ?was mildly dilated. There is mild left  ?ventricular hypertrophy. Left ventricular diastolic function could not be  ?evaluated.  ??2. Right ventricular systolic function is mildly reduced. The right  ?ventricular size is mildly enlarged. There is mildly elevated pulmonary  ?artery systolic pressure. The estimated right ventricular systolic  ?pressure is 41.4 mmHg.  ??3. Left atrial size was mildly dilated.  ??4. Right atrial size was moderately dilated.  ??5. A small pericardial effusion is present.  ??6. The mitral valve is normal in structure. Mild mitral valve  ?regurgitation.  ??7. Tricuspid valve regurgitation is mild to moderate.  ??8. The aortic valve is normal in structure. Aortic valve regurgitation is  ?not visualized. No aortic stenosis is present.  ??9. The inferior vena cava is dilated in size with <50% respiratory  ?variability, suggesting right atrial pressure of 15 mmHg.  ?  ?Neuro/Psych ?negative neurological ROS ? negative psych ROS  ? GI/Hepatic ?negative GI ROS, Neg  liver ROS,   ?Endo/Other  ?Obesity BMI 34 ? Renal/GU ?negative Renal ROS  ?negative genitourinary ?  ?Musculoskeletal ? ?(+) Arthritis , Osteoarthritis,   ? Abdominal ?(+) + obese,   ?Peds ? Hematology ?negative hematology ROS ?(+)   ?Anesthesia Other Findings ?HOH ? Reproductive/Obstetrics ?negative OB ROS ? ?  ? ? ? ? ? ? ? ? ? ? ? ? ? ?  ?  ? ? ? ? ? ? ? ?Anesthesia Physical ?Anesthesia Plan ? ?ASA: 3 ? ?Anesthesia Plan: General  ? ?Post-op Pain Management:   ? ?Induction: Intravenous ? ?PONV Risk Score and Plan: TIVA and Treatment may vary due to age or medical condition ? ?Airway Management Planned: Natural Airway and Mask ? ?Additional Equipment: None ? ?Intra-op Plan:  ? ?Post-operative Plan:  ? ?Informed Consent: I have reviewed the patients History and Physical, chart, labs and discussed the procedure including the risks, benefits and alternatives for the proposed anesthesia with the patient or authorized representative who has indicated his/her understanding and acceptance.  ? ? ? ?Dental advisory given ? ?Plan Discussed with: CRNA ? ?Anesthesia Plan Comments:   ? ? ? ? ? ?Anesthesia Quick Evaluation ? ?

## 2021-12-31 NOTE — Progress Notes (Signed)
RT came to place patient on CPAP HS. Patient is already on CPAP and resting comfortably.  ?

## 2021-12-31 NOTE — Progress Notes (Signed)
? ?Progress Note ? ?Patient Name: Shannon Liu ?Date of Encounter: 12/31/2021 ? ?[facility] Cardiologist: Candee Furbish, MD  ? ?Subjective  ? ?Feels well post cardioversion.  Successful.  Occasional PAC sees and PVCs noted heart rate currently 60 ? ?Inpatient Medications  ?  ?Scheduled Meds: ? apixaban  5 mg Oral BID  ? digoxin  0.125 mg Oral Daily  ? diltiazem  120 mg Oral BID  ? metoprolol tartrate  25 mg Oral QID  ? polyvinyl alcohol  1 drop Both Eyes QHS  ? potassium chloride  10 mEq Oral Daily  ? rosuvastatin  10 mg Oral Daily  ? ?Continuous Infusions: ? ?PRN Meds: ?acetaminophen, ALPRAZolam, alum & mag hydroxide-simeth, ondansetron (ZOFRAN) IV  ? ?Vital Signs  ?  ?Vitals:  ? 12/31/21 1120 12/31/21 1130 12/31/21 1137 12/31/21 1158  ?BP: (!) 109/49 (!) 109/49 116/61 132/88  ?Pulse:    (!) 58  ?Resp: 18 19 (!) 21 16  ?Temp: 97.9 ?F (36.6 ?C)   97.9 ?F (36.6 ?C)  ?TempSrc:    Oral  ?SpO2: 94%  95% 97%  ?Weight:      ?Height:      ? ? ?Intake/Output Summary (Last 24 hours) at 12/31/2021 1226 ?Last data filed at 12/31/2021 1116 ?Gross per 24 hour  ?Intake 300 ml  ?Output --  ?Net 300 ml  ? ?Last 3 Weights 12/31/2021 12/30/2021 12/28/2021  ?Weight (lbs) 217 lb 217 lb 3.2 oz 215 lb 9.8 oz  ?Weight (kg) 98.431 kg 98.521 kg 97.8 kg  ?   ? ?Telemetry  ?  ?Sinus rhythm PVCs PACs 50s to 60s Personally Reviewed ? ?ECG  ?  ?Sinus rhythm- Personally Reviewed ? ?Physical Exam  ? ?GEN: No acute distress.   ?Neck: No JVD ?Cardiac: RRR, no murmurs, rubs, or gallops.  ?Respiratory: Clear to auscultation bilaterally. ?GI: Soft, nontender, non-distended  ?MS: No edema; No deformity. ?Neuro:  Nonfocal  ?Psych: Normal affect  ? ?Labs  ?  ?High Sensitivity Troponin:  No results for input(s): TROPONINIHS in the last 720 hours.   ?Chemistry ?Recent Labs  ?Lab 12/26/21 ?1443 12/27/21 ?0325 12/27/21 ?0406 12/28/21 ?0240 12/31/21 ?0112  ?NA 138 136  --  138 138  ?K 4.0 3.1*  --  3.6 3.8  ?CL 102 101  --  102 104  ?CO2 24 26  --  27 25   ?GLUCOSE 113* 120*  --  126* 117*  ?BUN 15 14  --  14 17  ?CREATININE 0.64 0.73  --  0.94 0.74  ?CALCIUM 8.6* 8.6*  --  8.6* 8.6*  ?MG 1.7  --  1.7 2.1  --   ?GFRNONAA >60 >60  --  >60 >60  ?ANIONGAP 12 9  --  9 9  ?  ?Lipids No results for input(s): CHOL, TRIG, HDL, LABVLDL, LDLCALC, CHOLHDL in the last 168 hours.  ?Hematology ?Recent Labs  ?Lab 12/26/21 ?1443 12/31/21 ?0112  ?WBC 10.5 9.2  ?RBC 4.31 4.00  ?HGB 13.1 12.3  ?HCT 39.8 36.5  ?MCV 92.3 91.3  ?MCH 30.4 30.8  ?MCHC 32.9 33.7  ?RDW 14.2 13.9  ?PLT 205 201  ? ?Thyroid  ?Recent Labs  ?Lab 12/26/21 ?1121  ?TSH 0.819  ?  ?BNPNo results for input(s): BNP, PROBNP in the last 168 hours.  ?DDimer No results for input(s): DDIMER in the last 168 hours.  ? ?Radiology  ?  ?No results found. ? ?Cardiac Studies  ? ?TEE-no thrombus. ? ? ? 1. Left ventricular ejection  fraction, by estimation, is 40 to 45%. The  ?left ventricle has mildly decreased function. The left ventricle  ?demonstrates global hypokinesis. The left ventricular internal cavity size  ?was mildly dilated. There is mild left  ?ventricular hypertrophy. Left ventricular diastolic function could not be  ?evaluated.  ? 2. Right ventricular systolic function is mildly reduced. The right  ?ventricular size is mildly enlarged. There is mildly elevated pulmonary  ?artery systolic pressure. The estimated right ventricular systolic  ?pressure is 16.1 mmHg.  ? 3. Left atrial size was mildly dilated.  ? 4. Right atrial size was moderately dilated.  ? 5. A small pericardial effusion is present.  ? 6. The mitral valve is normal in structure. Mild mitral valve  ?regurgitation.  ? 7. Tricuspid valve regurgitation is mild to moderate.  ? 8. The aortic valve is normal in structure. Aortic valve regurgitation is  ?not visualized. No aortic stenosis is present.  ? 9. The inferior vena cava is dilated in size with <50% respiratory  ?variability, suggesting right atrial pressure of 15 mmHg.  ? ?Patient Profile  ?   ?81 y.o.  female with breast cancer who was found to be in atrial fibrillation with rapid ventricular response prior to lumpectomy.  Surgery was postponed.  She has surgery date planned for 1 month from now.  She underwent successful TEE cardioversion. ? ?Assessment & Plan  ?  ?Paroxysmal atrial fibrillation ?- We will go ahead and consolidate her metoprolol to Toprol-XL 50 mg once a day. ?-Continue with diltiazem CD 120 mg twice a day ?-Stop digoxin ?-Stop chlorthalidone/telmisartan-blood pressure has been excellent during the hospitalization. ?Continue with Eliquis 5 mg twice a day ?-If atrial fibrillation returns, we will load with amiodarone and perform repeat cardioversion. ? ?Breast cancer ?- Surgery is planned for 1 month from today.  This will allow adequate Eliquis usage post cardioversion to help prevent thrombosis. ?-Hold Eliquis for 2 days prior to lumpectomy. ? ?Hyperlipidemia ?- Continue with Crestor. ? ?She has clinic follow-up with me in May. ? ?Okay for discharge. ? ? ? ?For questions or updates, please contact [place] ?Please consult [email or web address] for contact info under  ? ?  ?   ?Signed, ?Candee Furbish, MD  ?12/31/2021, 12:26 PM   ? ?

## 2021-12-31 NOTE — Telephone Encounter (Addendum)
? ?  Patient Name: Shannon Liu  ?DOB: 1940/11/15 ?MRN: [id number] ? ?Primary Cardiologist: Candee Furbish, MD ? ?Update to preop callback team: ?Patient was admitted and underwent TEE/DCCV this admission. I am discharging her today for Dr. Marlou Porch. Per Dr. Marlou Porch, "Surgery is planned for 1 month from today.  This will allow adequate Eliquis usage post cardioversion to help prevent thrombosis. Hold Eliquis for 2 days prior to lumpectomy." I reviewed this instruction with patient. The patient was advised that if she develops new symptoms prior to surgery to contact our office to arrange for a follow-up visit, and she verbalized understanding. Otherwise, no need for pre-op appointment prior to surgery, OK to proceed as above per Dr. Marlou Porch. ? ?Will route this update as well as copy of DC summary to requesting provider via Epic fax function. Please call with questions. ? ?Charlie Pitter, PA-C ?12/31/2021, 1:34 PM ? ? ?

## 2021-12-31 NOTE — Transfer of Care (Signed)
Immediate Anesthesia Transfer of Care Note ? ?Patient: Shannon Liu ? ?Procedure(s) Performed: TRANSESOPHAGEAL ECHOCARDIOGRAM (TEE) ?CARDIOVERSION ? ?Patient Location: PACU ? ?Anesthesia Type:General ? ?Level of Consciousness: awake, alert  and oriented ? ?Airway & Oxygen Therapy: Patient Spontanous Breathing ? ?Post-op Assessment: Report given to RN and Post -op Vital signs reviewed and stable ? ?Post vital signs: Reviewed and stable ? ?Last Vitals:  ?Vitals Value Taken Time  ?BP    ?Temp    ?Pulse 80   ?Resp 16 12/31/21 1127  ?SpO2 94   ?Vitals shown include unvalidated device data. ? ?Last Pain:  ?Vitals:  ? 12/31/21 1035  ?TempSrc: Temporal  ?PainSc: 0-No pain  ?   ? ?Patients Stated Pain Goal: 0 (12/30/21 2145) ? ?Complications: No notable events documented. ?

## 2021-12-31 NOTE — Discharge Summary (Addendum)
?Discharge Summary  ?  ?Patient ID: Shannon Liu ?MRN: [id number]; DOB: 01/01/1941 ? ?Admit date: 12/26/2021 ?Discharge date: 12/31/2021 ? ?PCP:  Lavone Orn, MD ?  ?[facility] Providers ?Cardiologist:  Candee Furbish, MD      ? ? ?Discharge Diagnoses  ?  ?Principal Problem: ?  Atrial fibrillation with rapid ventricular response ([place]) ?Active Problems: ?  Hyperlipemia ?  Left ventricular dysfunction ?  Breast cancer ([place]) ?  Pericardial effusion ?  Mitral regurgitation ?  Tricuspid regurgitation ? ? ? ?Diagnostic Studies/Procedures  ?  ?2D echo 12/27/21 ?1. Left ventricular ejection fraction, by estimation, is 40 to 45%. The  ?left ventricle has mildly decreased function. The left ventricle  ?demonstrates global hypokinesis. The left ventricular internal cavity size  ?was mildly dilated. There is mild left  ?ventricular hypertrophy. Left ventricular diastolic function could not be  ?evaluated.  ? 2. Right ventricular systolic function is mildly reduced. The right  ?ventricular size is mildly enlarged. There is mildly elevated pulmonary  ?artery systolic pressure. The estimated right ventricular systolic  ?pressure is 32.6 mmHg.  ? 3. Left atrial size was mildly dilated.  ? 4. Right atrial size was moderately dilated.  ? 5. A small pericardial effusion is present.  ? 6. The mitral valve is normal in structure. Mild mitral valve  ?regurgitation.  ? 7. Tricuspid valve regurgitation is mild to moderate.  ? 8. The aortic valve is normal in structure. Aortic valve regurgitation is  ?not visualized. No aortic stenosis is present.  ? 9. The inferior vena cava is dilated in size with <50% respiratory  ?variability, suggesting right atrial pressure of 15 mmHg.  ?_____________ ?  ?History of Present Illness   ?  ?Shannon Liu is a 81 y.o. female with paroxysmal atrial fibrillation, mild LV dysfunction, HTN, HLD, breast CA, obesity who presented for breast surgery on 12/26/2021 and was found to be in afib RVR. ? ?She was  originally seen by cardiology in 2016 for chest pain, found to have AF RVR. Patient spontaneously converted while admitted. Patient had an echocardiogram on 06/24/2015 that showed EF 45-50%, moderate LVH, grade I diastolic dysfunction. She underwent a nuclear stress test on 07/06/2015 that was a low risk study without evidence of ischemia or infarction, EF 70%.  She has done well as an outpatient since that time. She is being treated for malignant neoplasm of upper-outer quadrant of right breast. Patient presented to the [facility] on 3/15 for a planned right breast lumpectomy with radioactive seed localization. While in preop, patient was found to be in atrial fibrillation with RVR. She was reportedly given esmolol and metoprolol in attempts to slow her heart rate, but HR stayed elevated so advised to present to [facility] ED for cardiology evaluation. Upon cardiology evaluation she did recall noticing her heart racing more frequently in the past few weeks. She had not taken Eliquis since 3/12 in anticipation of surgery. She was admitted for further evaluation.  ? ?Hospital Course  ?   ?1. Recurrence of paroxysmal atrial fibrillation with RVR ?- Eliquis was restarted upon arrival ?- echo 12/27/21 EF 40-45%, global HK, mildly dilated LV, mild LVH, mildly reduced RVF, mildly enlarged RV, mildly elevated PASP, mild LAE, moderate RAE, small pericardial effusion, mild MR, mild-moderate TR ?- rate control proved difficult despite beta blocker, CCB, and digoxin so underwent TEE DCCV today showing no LAA thrombus, mild-moderate LV systolic dysfunction (formal read pending) -> had successful conversion to NSR ?- at  discharge he recommends to consolidate metoprolol to Toprol '50mg'$  daily, continue with diltiazem CD '120mg'$  BID, stop digoxin, and stop chlorthalidone/telmisartan at discharge as BP has been controlled during hospitalization without this on board. She was continued on low dose potassium since K level was  intermittently low (improved to 3.8 at DC). Patient will follow her BP at home and notify if tending to run elevated ?- I inquired about refill on diltiazem but patient states she has whole new bottle at home and she does not need another refill presently ?- if atrial fibrillation recurs, Dr. Marlou Porch recommends to load with amiodarone and perform repeat DCCV after loading ? ?2. Left ventricular dysfunction ?- echo findings as above ?- no signs of volume excess/acute CHF this admission ?- no additional specific intervention felt necessary by MD at this time, continue to follow as OP ? ?3. Breast cancer ?- per Dr. Marlou Porch, "Surgery is planned for 1 month from today.  This will allow adequate Eliquis usage post cardioversion to help prevent thrombosis. Hold Eliquis for 2 days prior to lumpectomy." Reviewed this instruction with patient ?- patient advised to contact the office with any recurrent concerns - no need for preop appointment unless she develops new or worsening symptoms. She verbalized understanding of this plan. I will route update to preop team so they are aware, will also cc copy of discharge summary to requesting surgeon. ? ?4. Hyperlipidemia ?- recommended to continue rosuvastatin ? ?5. Small pericardial effusion, mild MR, mild-moderate TR ?- no specific intervention felt necessary at this time, follow clinically as OP ? ?Dr. Marlou Porch has seen and examined the patient today and feels she is stable for discharge. The patient thought they had an appt in May but this is only a recall. Dr. Marlou Porch recommends f/u in 2 months so this was arranged. ? ? ?Did the patient have an acute coronary syndrome (MI, NSTEMI, STEMI, etc) this admission?:  No                               ?Did the patient have a percutaneous coronary intervention (stent / angioplasty)?:  No.   ? ?  ?___________ ? ?Discharge Vitals ?Blood pressure 132/88, pulse (!) 58, temperature 97.9 ?F (36.6 ?C), temperature source Oral, resp. rate 16, height  '5\' 7"'$  (1.702 m), weight 98.4 kg, SpO2 97 %.  ?Filed Weights  ? 12/28/21 0558 12/30/21 0500 12/31/21 0539  ?Weight: 97.8 kg 98.5 kg 98.4 kg  ? ? ?Labs & Radiologic Studies  ?  ?CBC ?Recent Labs  ?  12/31/21 ?0112  ?WBC 9.2  ?NEUTROABS 5.7  ?HGB 12.3  ?HCT 36.5  ?MCV 91.3  ?PLT 201  ? ?Basic Metabolic Panel ?Recent Labs  ?  12/31/21 ?0112  ?NA 138  ?K 3.8  ?CL 104  ?CO2 25  ?GLUCOSE 117*  ?BUN 17  ?CREATININE 0.74  ?CALCIUM 8.6*  ? ? ?_____________  ?ECHOCARDIOGRAM COMPLETE ? ?Result Date: 12/27/2021 ?   ECHOCARDIOGRAM REPORT   Patient Name:   Shannon Liu Date of Exam: 12/27/2021 Medical Rec #:  [id number]      Height:       67.0 in Accession #:    [id number]     Weight:       215.6 lb Date of Birth:  [birth year]      BSA:          2.088 m? Patient Age:    71 years  BP:           112/71 mmHg Patient Gender: F              HR:           100 bpm. Exam Location:  Inpatient Procedure: 2D Echo, Cardiac Doppler and Color Doppler Indications:    Afib  History:        Patient has no prior history of Echocardiogram examinations.                 Arrythmias:Atrial Fibrillation; Risk Factors:Hypertension.  Sonographer:    Jyl Heinz Referring Phys: [id number] [place]  1. Left ventricular ejection fraction, by estimation, is 40 to 45%. The left ventricle has mildly decreased function. The left ventricle demonstrates global hypokinesis. The left ventricular internal cavity size was mildly dilated. There is mild left ventricular hypertrophy. Left ventricular diastolic function could not be evaluated.  2. Right ventricular systolic function is mildly reduced. The right ventricular size is mildly enlarged. There is mildly elevated pulmonary artery systolic pressure. The estimated right ventricular systolic pressure is 42.8 mmHg.  3. Left atrial size was mildly dilated.  4. Right atrial size was moderately dilated.  5. A small pericardial effusion is present.  6. The mitral valve is normal in structure. Mild  mitral valve regurgitation.  7. Tricuspid valve regurgitation is mild to moderate.  8. The aortic valve is normal in structure. Aortic valve regurgitation is not visualized. No aortic stenosis is present.  9. T

## 2021-12-31 NOTE — Anesthesia Procedure Notes (Signed)
Procedure Name: General with mask airway ?Date/Time: 12/31/2021 10:53 AM ?Performed by: Erick Colace, CRNA ?Pre-anesthesia Checklist: Patient identified, Emergency Drugs available, Suction available, Patient being monitored and Timeout performed ?Patient Re-evaluated:Patient Re-evaluated prior to induction ?Oxygen Delivery Method: Nasal cannula ?Preoxygenation: Pre-oxygenation with 100% oxygen ?Induction Type: IV induction ? ? ? ? ?

## 2022-01-01 ENCOUNTER — Encounter (HOSPITAL_COMMUNITY): Payer: Self-pay | Admitting: Cardiology

## 2022-01-02 ENCOUNTER — Telehealth: Payer: Self-pay | Admitting: Hematology

## 2022-01-02 NOTE — Telephone Encounter (Signed)
R/s post op per 3/20 inbasket, pt aware ?

## 2022-01-04 ENCOUNTER — Telehealth: Payer: Self-pay | Admitting: Cardiology

## 2022-01-04 ENCOUNTER — Telehealth: Payer: Self-pay | Admitting: Genetic Counselor

## 2022-01-04 DIAGNOSIS — I480 Paroxysmal atrial fibrillation: Secondary | ICD-10-CM

## 2022-01-04 MED ORDER — FUROSEMIDE 20 MG PO TABS: 20.0000 mg | ORAL_TABLET | Freq: Every day | ORAL | 3 refills | Status: DC

## 2022-01-04 MED ORDER — POTASSIUM CHLORIDE ER 10 MEQ PO TBCR: 10.0000 meq | EXTENDED_RELEASE_TABLET | Freq: Two times a day (BID) | ORAL | 3 refills | Status: DC

## 2022-01-04 NOTE — Telephone Encounter (Signed)
Per Dr. Lovena Le-  Start lasix 20 mg one tablet by mouth daily.  Increase potassium 10 meq-  Take one tablet by mouth twice a day.  Return for a BMP in 7 days.  ? ?Called patient with changes in medications. Patient will come in on Friday next week for lab work. Patient verbalized understanding. ?

## 2022-01-04 NOTE — Telephone Encounter (Signed)
I contacted Ms. Tsutsui to discuss her genetic testing results. No pathogenic variants were identified in the 77 genes analyzed. Of note, a variant of uncertain significance was identified in the BRCA2 gene. Detailed clinic note to follow. ? ?The test report has been scanned into EPIC and is located under the Molecular Pathology section of the Results Review tab.  A portion of the result report is included below for reference.  ? ?Bailey Flippin, MS, LCGC ?Genetic Counselor ?[email or web address] ?(P) [phone] ? ? ?

## 2022-01-04 NOTE — Telephone Encounter (Signed)
Called patient about her message. Patient stated that she is having SOB and has pain (like a soreness) around her rib cage when she tries to take a deep breath. Patient stated it is hard for her to take a deep breath. She reports BP 158/88 and HR 64. Patient complaining of gaining  8 pounds in 4 days, and BLE edema. Patient was on HCTZ 25 mg before hospital stay, but was not continued on it. Will consult DOD, Dr. Lovena Le. ?

## 2022-01-04 NOTE — Telephone Encounter (Signed)
Pt c/o Shortness Of Breath: STAT if SOB developed within the last 24 hours or pt is noticeably SOB on the phone ? ?1. Are you currently SOB (can you hear that pt is SOB on the phone)? Yes, cannot hear over the phone  ? ?2. How long have you been experiencing SOB? Since 04/20 when she left the hospital  ? ?3. Are you SOB when sitting or when up moving around? Both  ? ?4. Are you currently experiencing any other symptoms? Gaining weight ? ? ?Pt c/o swelling: STAT is pt has developed SOB within 24 hours ? ?If swelling, where is the swelling located? Legs  ? ?How much weight have you gained and in what time span? Was 210 lbs when she went to the hospital 218 lbs now ? ?Have you gained 3 pounds in a day or 5 pounds in a week? Yes ? ?Do you have a log of your daily weights (if so, list)? No  ? ?Are you currently taking a fluid pill? No, was taken off of it in the hospital   ? ?Are you currently SOB? Yes ? ?Have you traveled recently? No ? ?States it hurts in her rib cage right under her breast when she takes a deep breath. Feels she is retaining fluid since leaving the hospital. ?    ?

## 2022-01-07 ENCOUNTER — Ambulatory Visit: Payer: Self-pay | Admitting: Genetic Counselor

## 2022-01-07 ENCOUNTER — Encounter: Payer: Self-pay | Admitting: *Deleted

## 2022-01-07 DIAGNOSIS — Z1379 Encounter for other screening for genetic and chromosomal anomalies: Secondary | ICD-10-CM

## 2022-01-07 NOTE — Progress Notes (Signed)
HPI:   ?Ms. Casler was previously seen in the [place] clinic due to a personal and family history of cancer and concerns regarding a hereditary predisposition to cancer. Please refer to our prior cancer genetics clinic note for more information regarding our discussion, assessment and recommendations, at the time. Ms. Ginsberg recent genetic test results were disclosed to her, as were recommendations warranted by these results. These results and recommendations are discussed in more detail below. ? ?CANCER HISTORY:  ?Oncology History Overview Note  ? Cancer Staging  ?Malignant neoplasm of upper-outer quadrant of right breast in female, estrogen receptor positive ([place]) ?Staging form: Breast, AJCC 8th Edition ?- Clinical stage from 12/06/2021: Stage IA (cT1b, cN0, cM0, G2, ER+, PR+, HER2-) - Signed by Truitt Merle, MD on 12/11/2021 ? ?  ?Malignant neoplasm of upper-outer quadrant of right breast in female, estrogen receptor positive ([place])  ?11/28/2021 Mammogram  ? CLINICAL DATA:  Patient returns today to evaluate possible RIGHT breast asymmetries identified on recent screening mammogram. ?  ?EXAM: ?DIGITAL DIAGNOSTIC UNILATERAL RIGHT MAMMOGRAM WITH TOMOSYNTHESIS AND CAD; ULTRASOUND RIGHT BREAST LIMITED ? ?IMPRESSION: ?1. Irregular hypoechoic mass in the RIGHT breast at the 10 o'clock ?axis, 7 cm from the nipple, measuring 9 mm, with associated ?architectural distortion, corresponding to the mammographic finding. This is a highly suspicious finding for which ultrasound-guided core biopsy is recommended. ?2. Additional benign findings within the RIGHT breast, as detailed ?above. ?3. No enlarged or morphologically abnormal lymph nodes in the RIGHT axilla. ?  ?12/06/2021 Cancer Staging  ? Staging form: Breast, AJCC 8th Edition ?- Clinical stage from 12/06/2021: Stage IA (cT1b, cN0, cM0, G2, ER+, PR+, HER2-) - Signed by Truitt Merle, MD on 12/11/2021 ?Stage prefix: Initial diagnosis ?Histologic grading system: 3  grade system ? ?  ?12/06/2021 Initial Biopsy  ? Diagnosis ?Breast, right, needle core biopsy, 10 o'clock, 7cmfn ?INVASIVE DUCTAL CARCINOMA WITH LOBULAR FEATURES, GRADE 2 (3+2+1) ? ?An E-cadherin immunohistochemical stains performed with adequate control. This stain is diffusely negative within the tumor. Histologically the tumor forms ducts and is present in cribriform sheets as well as focally present in cords and small solid nests. Given this histology it is felt the negative E-cadherin stain represents aberrant absence of expression within an otherwise ductal carcinoma. This lack of E-cadherin expression may explain the focal lobular features. ? ?PROGNOSTIC INDICATORS ?Results: ?The tumor cells are EQUIVOCAL for Her2 (2+). Her2 by FISH will be performed and the results reported separately. ?Estrogen Receptor: 95%, POSITIVE, STRONG STAINING INTENSITY ?Progesterone Receptor: 80%, POSITIVE, STRONG STAINING INTENSITY ?Proliferation Marker Ki67: 2% ?  ?12/10/2021 Initial Diagnosis  ? Malignant neoplasm of upper-outer quadrant of right breast in female, estrogen receptor positive ([place]) ?  ? Genetic Testing  ? [facility] CancerNext-Expanded Panel was Negative. Of note, a variant of uncertain significance was detected in the BRCA2 gene (p.P606L). Report date is 12/24/2021. ? ?The CancerNext-Expanded gene panel offered by [facility] and includes sequencing, rearrangement, and RNA analysis for the following 77 genes: AIP, ALK, APC, ATM, AXIN2, BAP1, BARD1, BLM, BMPR1A, BRCA1, BRCA2, BRIP1, CDC73, CDH1, CDK4, CDKN1B, CDKN2A, CHEK2, CTNNA1, DICER1, FANCC, FH, FLCN, GALNT12, KIF1B, LZTR1, MAX, MEN1, MET, MLH1, MSH2, MSH3, MSH6, MUTYH, NBN, NF1, NF2, NTHL1, PALB2, PHOX2B, PMS2, POT1, PRKAR1A, PTCH1, PTEN, RAD51C, RAD51D, RB1, RECQL, RET, SDHA, SDHAF2, SDHB, SDHC, SDHD, SMAD4, SMARCA4, SMARCB1, SMARCE1, STK11, SUFU, TMEM127, TP53, TSC1, TSC2, VHL and XRCC2 (sequencing and deletion/duplication); EGFR, EGLN1, HOXB13, KIT, MITF,  PDGFRA, POLD1, and POLE (sequencing only); EPCAM and GREM1 (deletion/duplication  only).  ?  ? ? ?FAMILY HISTORY:  ?We obtained a detailed, 4-generation family history.  Significant diagnoses are listed below: ?Family History  ?Problem Relation Age of Onset  ? Heart attack Mother 28  ? CVA Mother 68  ? Unexplained death Father 71  ? Hypertension Sister   ? Hypertension Brother   ? Colon cancer Maternal Grandfather   ? Prostate cancer Son 21  ? Breast cancer Neg Hx   ? ? ?    ?  ?  ?Ms. Dionisio's son was diagnosed with high risk prostate cancer at age 1, he is currently 40. Her maternal grandfather was diagnosed with colon cancer at an unknown age, he is deceased. Ms. Butrick is unaware of previous family history of genetic testing for hereditary cancer risks. There is no reported Ashkenazi Jewish ancestry.  ?  ? ?GENETIC TEST RESULTS:  ?The [facility] CancerNext-Expanded Panel found no pathogenic mutations. ? ?The CancerNext-Expanded gene panel offered by [facility] and includes sequencing, rearrangement, and RNA analysis for the following 77 genes: AIP, ALK, APC, ATM, AXIN2, BAP1, BARD1, BLM, BMPR1A, BRCA1, BRCA2, BRIP1, CDC73, CDH1, CDK4, CDKN1B, CDKN2A, CHEK2, CTNNA1, DICER1, FANCC, FH, FLCN, GALNT12, KIF1B, LZTR1, MAX, MEN1, MET, MLH1, MSH2, MSH3, MSH6, MUTYH, NBN, NF1, NF2, NTHL1, PALB2, PHOX2B, PMS2, POT1, PRKAR1A, PTCH1, PTEN, RAD51C, RAD51D, RB1, RECQL, RET, SDHA, SDHAF2, SDHB, SDHC, SDHD, SMAD4, SMARCA4, SMARCB1, SMARCE1, STK11, SUFU, TMEM127, TP53, TSC1, TSC2, VHL and XRCC2 (sequencing and deletion/duplication); EGFR, EGLN1, HOXB13, KIT, MITF, PDGFRA, POLD1, and POLE (sequencing only); EPCAM and GREM1 (deletion/duplication only).  ? ?The test report has been scanned into EPIC and is located under the Molecular Pathology section of the Results Review tab.  A portion of the result report is included below for reference. Genetic testing reported out on 12/24/2021. ? ?  ? ? ? ? ? ?Genetic testing identified a  variant of uncertain significance (VUS) in the BRCA2 gene called p.P606L.  At this time, it is unknown if this variant is associated with an increased risk for cancer or if it is benign, but most uncertain variants are reclassified to benign. It should not be used to make medical management decisions. With time, we suspect the laboratory will determine the significance of this variant, if any. If the laboratory reclassifies this variant, we will attempt to contact Ms. Murton to discuss it further.  ? ?Even though a pathogenic variant was not identified, possible explanations for her personal history of cancer may include: ?There may be no hereditary risk for cancer in the family. The cancers in Ms. Jeanmarie and/or her family may be due to other genetic or environmental factors. ?There may be a gene mutation in one of these genes that current testing methods cannot detect, but that chance is small. ?There could be another gene that has not yet been discovered, or that we have not yet tested, that is responsible for the cancer diagnoses in the family.  ?It is also possible there is a hereditary cause for the cancer in the family that Ms. Tyree did not inherit. ?The variant of uncertain significance detected in the BRCA2 gene may be reclassified as a pathogenic variant in the future. At this time, we do not know if this variant increases the risk for cancer. ? ?Therefore, it is important to remain in touch with cancer genetics in the future so that we can continue to offer Ms. Metheny the most up to date genetic testing.  ? ?ADDITIONAL GENETIC TESTING:  ?We discussed with Ms.  Guilfoil that her genetic testing was fairly extensive.  If there are genes identified to increase cancer risk that can be analyzed in the future, we would be happy to discuss and coordinate this testing at that time.   ? ?CANCER SCREENING RECOMMENDATIONS:  ?Ms. Ahola's test result is considered negative (normal).  This means that we have not identified a  hereditary cause for her personal and family history of cancer at this time. Most cancers happen by chance and this negative test suggests that her cancer may fall into this category.   ? ?An individual's cancer ris

## 2022-01-09 ENCOUNTER — Encounter: Payer: Self-pay | Admitting: Cardiology

## 2022-01-09 ENCOUNTER — Other Ambulatory Visit: Payer: Self-pay

## 2022-01-09 ENCOUNTER — Other Ambulatory Visit: Payer: Medicare PPO | Admitting: *Deleted

## 2022-01-09 ENCOUNTER — Ambulatory Visit (INDEPENDENT_AMBULATORY_CARE_PROVIDER_SITE_OTHER): Payer: Medicare PPO

## 2022-01-09 ENCOUNTER — Ambulatory Visit: Payer: Medicare PPO | Admitting: Hematology

## 2022-01-09 ENCOUNTER — Ambulatory Visit: Payer: Medicare PPO | Admitting: Cardiology

## 2022-01-09 DIAGNOSIS — I480 Paroxysmal atrial fibrillation: Secondary | ICD-10-CM

## 2022-01-09 DIAGNOSIS — Z7901 Long term (current) use of anticoagulants: Secondary | ICD-10-CM | POA: Diagnosis not present

## 2022-01-09 DIAGNOSIS — I5032 Chronic diastolic (congestive) heart failure: Secondary | ICD-10-CM | POA: Diagnosis not present

## 2022-01-09 MED ORDER — METOPROLOL SUCCINATE ER 25 MG PO TB24: 25.0000 mg | ORAL_TABLET | Freq: Every day | ORAL | 3 refills | Status: DC

## 2022-01-09 NOTE — Patient Instructions (Signed)
Medication Instructions:  ?Your physician has recommended you make the following change in your medication:  ?1-Decrease Metoprolol 25 mg by mouth daily. ? ?*If you need a refill on your cardiac medications before your next appointment, please call your pharmacy* ? ? ?Lab Work: ?If you have labs (blood work) drawn today and your tests are completely normal, you will receive your results only by: ?MyChart Message (if you have MyChart) OR ?A paper copy in the mail ?If you have any lab test that is abnormal or we need to change your treatment, we will call you to review the results. ? ? ?Testing/Procedures: ?Your physician has recommended that you wear a zio monitor. Zio monitors are medical devices that record the heart?s electrical activity. Doctors most often [place] these monitors to diagnose arrhythmias. Arrhythmias are problems with the speed or rhythm of the heartbeat. The monitor is a small, portable device. You can wear one while you do your normal daily activities. This is usually used to diagnose what is causing palpitations/syncope (passing out). ? ?Follow-Up: ?At [facility], you and your health needs are our priority.  As part of our continuing mission to provide you with exceptional heart care, we have created designated Provider Care Teams.  These Care Teams include your primary Cardiologist (physician) and Advanced Practice Providers (APPs -  Physician Assistants and Nurse Practitioners) who all work together to provide you with the care you need, when you need it. ? ?We recommend signing up for the patient portal called "MyChart".  Sign up information is provided on this After Visit Summary.  MyChart is used to connect with patients for Virtual Visits (Telemedicine).  Patients are able to view lab/test results, encounter notes, upcoming appointments, etc.  Non-urgent messages can be sent to your provider as well.   ?To learn more about what you can do with MyChart, go to [email or web address].    ? ?Your next appointment:   ?2 week(s) ? ?The format for your next appointment:   ?In Person ? ?Provider:   ?Robbie Lis, PA-C, Nicholes Rough, PA-C, Melina Copa, PA-C, Ambrose Pancoast, NP, Cecilie Kicks, NP, Ermalinda Barrios, PA-C, Christen Bame, NP, Richardson Dopp, PA-C, or Margie Billet, NP     Then, Candee Furbish, MD will plan to see you again in 1 year(s).{ ? ?Other Instructions ?ZIO XT- Long Term Monitor Instructions ? ?Your physician has requested you wear a ZIO patch monitor for 14 days.  ?This is a single patch monitor. [employer] supplies one patch monitor per enrollment. Additional ?stickers are not available. Please do not apply patch if you will be having a Nuclear Stress Test,  ?Echocardiogram, Cardiac CT, MRI, or Chest Xray during the period you would be wearing the  ?monitor. The patch cannot be worn during these tests. You cannot remove and re-apply the  ?ZIO XT patch monitor.  ?Your ZIO patch monitor will be mailed 3 day USPS to your address on file. It may take 3-5 days  ?to receive your monitor after you have been enrolled.  ?Once you have received your monitor, please review the enclosed instructions. Your monitor  ?has already been registered assigning a specific monitor serial # to you. ? ?Billing and Patient Assistance Program Information ? ?We have supplied [employer] with any of your insurance information on file for billing purposes. ?[employer] offers a sliding scale Patient Assistance Program for patients that do not have  ?insurance, or whose insurance does not completely cover the cost of the ZIO monitor.  ?You must apply for  the Patient Assistance Program to qualify for this discounted rate.  ?To apply, please call [employer] at [phone], select option 4, select option 2, ask to apply for  ?Patient Assistance Program. Theodore Demark will ask your household income, and how many people  ?are in your household. They will quote your out-of-pocket cost based on that information.  ?[employer] will also be able to set up a  74-month interest-free payment plan if needed. ? ?Applying the monitor ?  ?Shave hair from upper left chest.  ?Hold abrader disc by orange tab. Rub abrader in 40 strokes over the upper left chest as  ?indicated in your monitor instructions.  ?Clean area with 4 enclosed alcohol pads. Let dry.  ?Apply patch as indicated in monitor instructions. Patch will be placed under collarbone on left  ?side of chest with arrow pointing upward.  ?Rub patch adhesive wings for 2 minutes. Remove white label marked "1". Remove the white  ?label marked "2". Rub patch adhesive wings for 2 additional minutes.  ?While looking in a mirror, press and release button in center of patch. A small green light will  ?flash 3-4 times. This will be your only indicator that the monitor has been turned on.  ?Do not shower for the first 24 hours. You may shower after the first 24 hours.  ?Press the button if you feel a symptom. You will hear a small click. Record Date, Time and  ?Symptom in the Patient Logbook.  ?When you are ready to remove the patch, follow instructions on the last 2 pages of Patient  ?Logbook. Stick patch monitor onto the last page of Patient Logbook.  ?Place Patient Logbook in the blue and white box. Use locking tab on box and tape box closed  ?securely. The blue and white box has prepaid postage on it. Please place it in the mailbox as  ?soon as possible. Your physician should have your test results approximately 7 days after the  ?monitor [facility]  ?Call [facility] [phone] you have questions regarding  ?your ZIO XT patch monitor. Call them immediately if you see an orange light blinking on your  ?monitor.  ?If your monitor falls off in less than 4 days, contact our Monitor department at [phone]  ?If your monitor becomes loose or falls off after 4 days call [employer] at [phone]  ?suggestions on securing your monitor ? ?

## 2022-01-09 NOTE — Progress Notes (Signed)
?Cardiology Office Note:   ? ?Date:  01/09/2022  ? ?ID:  Shannon Liu, DOB Nov 30, 1940, MRN [id number] ? ?PCP:  Lavone Orn, MD ?  ?[facility] Providers ?Cardiologist:  Candee Furbish, MD    ? ?Referring MD: Lavone Orn, MD  ? ? ?History of Present Illness:   ? ?Shannon Liu is a 81 y.o. female here for follow up of hypertension, atrial fibullation, and hyperlipidemia. She is using a OSA-CPAP, she has no complaints.  ? ?She went to the atrial fibrillation clinic in the past. She complained of having vertigo and called EMS. EKG strip was sinus rhythm with PACs at the time. At her last visit, she was doing well with no recent Afib episodes. She was tolerating Eliquis with no bleeding issues.  ? ?She presented to the ED 12/26/21 with hypertension and Afib. She was being treated for a malignant neoplasm in her upper-outer R breast. She presented to [facility] 3/15 for lumpectomy pre-op but was found to be in Afib with RVR. She was given esmolol and metoprolol and advised to present to [facility] ED for cardiology evaluation and underwent cardioversion.  ? ?Over the past few weeks, she noticed more frequent heart racing episodes and almost constant palpitations. Her heart rate was in the 160s bpm. She had not taken Eliquis since 3/12 due to her upcoming surgery. Her surgery was postponed and she was started back on Eliquis. Echo showed LVEF 40-45% and global HK, mildly dilated LV, mild LVH, mildly reduced RVF, mildly enlarged RV, mildly elevated PASP, mild LAE, moderate RAE, small pericardial effusion, mild MR, and mild-moderate TR.  ? ?Since coming home from the hospital, she was feeling poorly with worsening shortness of breath, weight gain, and bloating. She was advised to take 20 mg furosemide. Her heart rate was in the 40s and was feeling weak and fatigued.  ? ?Today, she is accompanied by a family member and doing well. When she came home from the hospital, she felt bloated with associated chest tightness,  weight gain, and shortness of breath. The bloating has resolved with Lasix.  ? ?However, her heart rate this morning was 45 bpm. Currently, she is taking 50 mg metoprolol. She wonders if she can reduce her metoprolol without significantly affecting her blood pressure. She reports she continues to feel episodes of Afib after her cardioversion but they are slow. The first episode occurred a few days after her cardioversion. Taking an extra diltiazem improves her Afib episode. She denies any chest pain, or shortness of breath, lightheadedness, headaches, syncope, orthopnea, or PND. ? ?Past Medical History:  ?Diagnosis Date  ? Arthritis   ? HOH (hard of hearing)   ? HTN (hypertension)   ? since pregnant at age 79  ? Hyperlipemia   ? Hypertension   ? PAF (paroxysmal atrial fibrillation) ([place])   ? a. dx on 06/2015 admission. spontaneously converted into NSR. placed on Eliquis  ? Sleep apnea   ? CPAP  ? ? ?Past Surgical History:  ?Procedure Laterality Date  ? ABDOMINAL HYSTERECTOMY    ? BREAST CYST EXCISION Left   ? BREAST SURGERY Left   ? lumpectomy 35 yrs ago  ? CARDIOVERSION N/A 12/31/2021  ? Procedure: CARDIOVERSION;  Surgeon: Donato Heinz, MD;  Location[facility] ENDOSCOPY;  Service: Cardiovascular;  Laterality: N/A;  ? NECK SURGERY    ? discectomy with cadaver bone  ? TEE WITHOUT CARDIOVERSION N/A 12/31/2021  ? Procedure: TRANSESOPHAGEAL ECHOCARDIOGRAM (TEE);  Surgeon: Donato Heinz, MD;  Location: [facility] ENDOSCOPY;  Service: Cardiovascular;  Laterality: N/A;  ? ? ?Current Medications: ?Current Meds  ?Medication Sig  ? acetaminophen (TYLENOL) 500 MG tablet Take 500 mg by mouth every 6 (six) hours as needed for mild pain.  ? ALPRAZolam (XANAX) 0.25 MG tablet Take 0.25 mg by mouth at bedtime as needed for anxiety.  ? cholecalciferol (VITAMIN D3) 25 MCG (1000 UNIT) tablet Take 1,000 Units by mouth 2 (two) times daily.  ? diltiazem (CARDIZEM CD) 120 MG 24 hr capsule Take 1 capsule (120 mg total) by mouth 2 (two)  times daily. Please make yearly appt with Dr. Marlou Porch for May 2023 for future refills. Thank you 1st attempt (Patient taking differently: Take 120 mg by mouth 2 (two) times daily.)  ? diltiazem (CARDIZEM) 30 MG tablet Take 1 tablet every 4 hours AS NEEDED for AFIB heart rate >100 (Patient taking differently: Take 30 mg by mouth every 4 (four) hours as needed (For AFIB heart rate >100).)  ? ELIQUIS 5 MG TABS tablet TAKE ONE TABLET BY MOUTH TWICE DAILY (Patient taking differently: Take 5 mg by mouth in the morning and at bedtime.)  ? furosemide (LASIX) 20 MG tablet Take 1 tablet (20 mg total) by mouth daily.  ? Lactobacillus (PROBIOTIC ACIDOPHILUS PO) Take 1 tablet by mouth daily.  ? Multiple Vitamins-Minerals (MULTIVITAMIN WITH MINERALS) tablet Take 1 tablet by mouth daily.  ? potassium chloride (KLOR-CON) 10 MEQ tablet Take 1 tablet (10 mEq total) by mouth 2 (two) times daily.  ? Propylene Glycol 0.6 % SOLN Place 1 drop into both eyes at bedtime.   ? rosuvastatin (CRESTOR) 10 MG tablet Take 10 mg by mouth daily.  ? [DISCONTINUED] metoprolol succinate (TOPROL-XL) 50 MG 24 hr tablet Take 1 tablet (50 mg total) by mouth daily. Take with or immediately following a meal.  ?  ? ?Allergies:   Morphine and related and Penicillins  ? ?Social History  ? ?Socioeconomic History  ? Marital status: Widowed  ?  Spouse name: Not on file  ? Number of children: Not on file  ? Years of education: Not on file  ? Highest education level: Not on file  ?Occupational History  ? Occupation: Retired  ?Tobacco Use  ? Smoking status: Never  ? Smokeless tobacco: Never  ?Vaping Use  ? Vaping Use: Never used  ?Substance and Sexual Activity  ? Alcohol use: No  ?  Alcohol/week: 0.0 standard drinks  ? Drug use: No  ? Sexual activity: Not on file  ?Other Topics Concern  ? Not on file  ?Social History Narrative  ? Lives [place], near [place]. Daughter currently lives with her.  ? ?Social Determinants of Health  ? ?Financial Resource  Strain: Not on file  ?Food Insecurity: Not on file  ?Transportation Needs: Not on file  ?Physical Activity: Not on file  ?Stress: Not on file  ?Social Connections: Not on file  ?  ? ?Family History: ?The patient's family history includes CVA (age of onset: 58) in her mother; Colon cancer in her maternal grandfather; Heart attack (age of onset: 54) in her mother; Hypertension in her brother and sister; Prostate cancer (age of onset: 28) in her son; Unexplained death (age of onset: 51) in her father. There is no history of Breast cancer. ? ?ROS:   ?Please see the history of present illness.  ?(+) Palpitations ?All other systems reviewed and are negative. ? ?EKGs/Labs/Other Studies Reviewed:   ? ?The following studies were reviewed today: ?Echo  TEE (Endo) 12/31/21 ? 1. Left ventricular ejection fraction, by estimation, is 40 to 45%. The left ventricle has mildly decreased function.  ? 2. Right ventricular systolic function is mildly reduced. The right ventricular size is normal.  ? 3. Left atrial size was mildly dilated. No left atrial/left atrial  ?appendage thrombus was detected.  ? 4. Right atrial size was mildly dilated.  ? 5. A small pericardial effusion is present.  ? 6. The mitral valve is normal in structure. Mild mitral valve  ?regurgitation.  ? 7. Tricuspid valve regurgitation is mild to moderate.  ? 8. The aortic valve is tricuspid. Aortic valve regurgitation is not visualized.  ? 9. Aortic dilatation noted. There is mild dilatation of the ascending aorta, measuring 38 mm.  ? ?Conclusion(s)/Recommendation(s): No LA/LAA thrombus identified. Successful cardioversion performed with restoration of normal sinus rhythm.  ? ?Echo 12/27/21 ?1. Left ventricular ejection fraction, by estimation, is 40 to 45%. The left ventricle has mildly decreased function. The left ventricle demonstrates global hypokinesis. The left ventricular internal cavity size was mildly dilated. There is mild left ventricular hypertrophy. Left  ventricular diastolic function could not be evaluated.  ? 2. Right ventricular systolic function is mildly reduced. The right ventricular size is mildly enlarged. There is mildly elevated pulmonary arte

## 2022-01-09 NOTE — Assessment & Plan Note (Addendum)
Heart rate at home 45 bpm at times.  EKG today confirms this.  We will decrease her Toprol from 50 down to 25.  Continuing with Cardizem CD 120 mg twice a day.  She also thinks that she is in atrial fibrillation occasionally as well.  She takes an additional 30 mg as needed.  I will place a Zio patch monitor to hopefully confirm this.  If atrial fibrillation has returned paroxysmal, we will refer to electrophysiology for potential further therapy such as antiarrhythmics or ablative therapies.  I do think that she can proceed with lumpectomy for her breast cancer as long as her atrial fibrillation if it does return is under adequate rate control..  We had trouble in the hospital controlling her heart rate previously.  She did require cardioversion. ?

## 2022-01-09 NOTE — Assessment & Plan Note (Signed)
Continue with Eliquis. ?

## 2022-01-09 NOTE — Assessment & Plan Note (Signed)
She did take Lasix for a few days.  Her weight gain was approximately 6 to 9 pounds at home.  This helped out significantly. ?

## 2022-01-09 NOTE — Progress Notes (Unsigned)
[id number] from office inventory applied to patient. ?

## 2022-01-10 ENCOUNTER — Other Ambulatory Visit: Payer: Medicare PPO

## 2022-01-10 LAB — BASIC METABOLIC PANEL
BUN/Creatinine Ratio: 22 (ref 12–28)
BUN: 18 mg/dL (ref 8–27)
CO2: 26 mmol/L (ref 20–29)
Calcium: 9.6 mg/dL (ref 8.7–10.3)
Chloride: 106 mmol/L (ref 96–106)
Creatinine, Ser: 0.81 mg/dL (ref 0.57–1.00)
Glucose: 138 mg/dL — ABNORMAL HIGH (ref 70–99)
Potassium: 3.9 mmol/L (ref 3.5–5.2)
Sodium: 146 mmol/L — ABNORMAL HIGH (ref 134–144)
eGFR: 73 mL/min/{1.73_m2} (ref >59)

## 2022-01-11 ENCOUNTER — Other Ambulatory Visit: Payer: Medicare PPO

## 2022-01-19 NOTE — Progress Notes (Addendum)
?Cardiology Office Note:   ? ?Date:  01/28/2022  ? ?ID:  Shannon Liu, DOB [birth year], MRN [id number] ? ?PCP:  Lavone Orn, MD ?  ?[facility] Providers ?Cardiologist:  Candee Furbish, MD    ? ?Referring MD: Lavone Orn, MD  ? ?Chief Complaint: follow-up atrial fibrillation ? ?History of Present Illness:   ? ?Shannon Liu is a very pleasant 81 y.o. female with a hx of PAF, hypertension, hyperlipidemia, OSA on CPAP, and breast cancer.  ? ?Was seen in [facility] in the past. Has maintained anticoagulation on Eliquis. She presented to ED 12/26/21 with hypertension and a fib. She was at [facility] on 3/15 for a scheduled lumpectomy for a malignant neoplasm in right breast and was found to be in a fib with RVR in preop. She was given esmolol and metoprolol and advised to present to [facility] ED for cardiology evaluation. She had stopped Eliquis 12/23/21 in preparation for surgery. Echo 12/27/21 showed LVEF 40-45% and global hypokinesis, mildly dilated LV, mild LVH, mildly reduced RVF, mildly enlarged RV, mildly elevated PASP, mild LAE, moderate RAE, small pericardial effusion, mild MR, and mild-moderate TR. On 12/31/21 she underwent TEE/cardioversion with restoration to NSR and patient was discharged 12/31/21.  ? ?She sent a message to our office 01/04/22 with concerns for more frequent heart racing episodes and almost constant palpitations, also feeling fatigued. HR was in 160s bpm.  Following cardioversion, she was feeling poorly with worsening shortness of breath, weight gain, and bloating. She was advised to take 20 mg furosemide. HR was in the 40s and was feeling weak and fatigued so office visit was scheduled.  ? ?She was last seen in our office on 01/09/2022 by Dr. Marlou Porch at which time heart rate was slow and she was feeling symptoms of a fib. Diltiazem 30 mg was helping relieve s/s of a fib. Dr. Marlou Porch reduced her Toprol from 50 mg to 25 mg due to bradycardia and fatigue. She was continued on Cardizem CD1 120  mg a day. Zio cardiac monitor was ordered for 2 weeks. Her weight gain had improved with addition of Lasix. She was advised to follow-up in 2 weeks.  Breast lumpectomy rescheduled for 01/31/22 with plan to hold Eliquis for 2 days preop due to continuation of AC for one month post DCCV.  ? ?Today, she is here alone and reports she is feeling better.  States she has more energy and has noticed that heart rate is no longer in the 40s (now 50s-low 60s) on the lower dose of metoprolol.  Continues to have palpitations and "flutters" as well as fatigue. Home BP has been elevated 140s over 70s.  PCP planned for her to work with pharmacist for better blood pressure control, however she was dealing with the new diagnosis of breast cancer and did not complete the appointment. Uses CPAP every night.  Was previously walking a mile 3 times a week, but has not done so lately.  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 She denies chest pain, melena, hematuria, hemoptysis, diaphoresis, weakness, presyncope, syncope, orthopnea, and PND. Cardiac monitor has not resulted yet.  ? ? ?Past Medical History:  ?Diagnosis Date  ? Arthritis   ? HOH (hard of hearing)   ? HTN (hypertension)   ? since pregnant at age 49  ? Hyperlipemia   ? Hypertension   ? PAF (paroxysmal atrial fibrillation) ([place])   ? a.  dx on 06/2015 admission. spontaneously converted into NSR. placed on Eliquis  ? Sleep apnea   ? CPAP  ? ? ?Past Surgical History:  ?Procedure Laterality Date  ? ABDOMINAL HYSTERECTOMY    ? BREAST CYST EXCISION Left   ? BREAST SURGERY Left   ? lumpectomy 35 yrs ago  ? CARDIOVERSION N/A 12/31/2021  ? Procedure: CARDIOVERSION;  Surgeon: Donato Heinz, MD;  Location: [facility] ENDOSCOPY;  Service: Cardiovascular;  Laterality: N/A;  ? NECK SURGERY    ? discectomy with cadaver bone  ? TEE WITHOUT CARDIOVERSION N/A 12/31/2021  ? Procedure: TRANSESOPHAGEAL ECHOCARDIOGRAM (TEE);  Surgeon:  Donato Heinz, MD;  Location: [place];  Service: Cardiovascular;  Laterality: N/A;  ? ? ?Current Medications: ?Current Meds  ?Medication Sig  ? acetaminophen (TYLENOL) 500 MG tablet Take 500 mg by mouth every 6 (six) hours as needed for mild pain.  ? ALPRAZolam (XANAX) 0.25 MG tablet Take 0.25 mg by mouth at bedtime as needed for anxiety.  ? cholecalciferol (VITAMIN D3) 25 MCG (1000 UNIT) tablet Take 1,000 Units by mouth 2 (two) times daily.  ? diltiazem (CARDIZEM CD) 120 MG 24 hr capsule Take 1 capsule (120 mg total) by mouth 2 (two) times daily. Please make yearly appt with Dr. Marlou Porch for May 2023 for future refills. Thank you 1st attempt  ? diltiazem (CARDIZEM) 30 MG tablet Take 1 tablet every 4 hours AS NEEDED for AFIB heart rate >100  ? ELIQUIS 5 MG TABS tablet TAKE ONE TABLET BY MOUTH TWICE DAILY  ? furosemide (LASIX) 20 MG tablet Take 1 tablet (20 mg total) by mouth daily.  ? Lactobacillus (PROBIOTIC ACIDOPHILUS PO) Take 1 tablet by mouth daily.  ? metoprolol succinate (TOPROL-XL) 25 MG 24 hr tablet Take 1 tablet (25 mg total) by mouth daily. Take with or immediately following a meal.  ? Multiple Vitamins-Minerals (MULTIVITAMIN WITH MINERALS) tablet Take 1 tablet by mouth daily.  ? potassium chloride (KLOR-CON) 10 MEQ tablet Take 1 tablet (10 mEq total) by mouth 2 (two) times daily.  ? Propylene Glycol 0.6 % SOLN Place 1 drop into both eyes at bedtime.   ? rosuvastatin (CRESTOR) 10 MG tablet Take 10 mg by mouth daily.  ? telmisartan (MICARDIS) 80 MG tablet Take 0.5 tablets (40 mg total) by mouth daily.  ?  ? ?Allergies:   Amlodipine besylate, Buspirone, Fluoxetine, Metoprolol tartrate, Morphine and related, Penicillins, and Sertraline hcl  ? ?Social History  ? ?Socioeconomic History  ? Marital status: Widowed  ?  Spouse name: Not on file  ? Number of children: Not on file  ? Years of education: Not on file  ? Highest education level: Not on file  ?Occupational History  ? Occupation: Retired   ?Tobacco Use  ? Smoking status: Never  ? Smokeless tobacco: Never  ?Vaping Use  ? Vaping Use: Never used  ?Substance and Sexual Activity  ? Alcohol use: No  ?  Alcohol/week: 0.0 standard drinks  ? Drug use: No  ? Sexual activity: Not on file  ?Other Topics Concern  ? Not on file  ?Social History Narrative  ? Lives [place], near [place]. Daughter currently lives with her.  ? ?Social Determinants of Health  ? ?Financial Resource Strain: Not on file  ?Food Insecurity: Not on file  ?Transportation Needs: Not on file  ?Physical Activity: Not on file  ?Stress: Not on file  ?Social Connections: Not on file  ?  ? ?Family History: ?The patient's family history includes  CVA (age of onset: 84) in her mother; Colon cancer in her maternal grandfather; Heart attack (age of onset: 11) in her mother; Hypertension in her brother and sister; Prostate cancer (age of onset: 63) in her son; Unexplained death (age of onset: 12) in her father. There is no history of Breast cancer. ? ?ROS:   ?Please see the history of present illness.    ?+palpitations ?+ fatigue ?All other systems reviewed and are negative. ? ?Labs/Other Studies Reviewed:   ? ?The following studies were reviewed today: ? ?TTE Complete 12/27/21 ? ?Left Ventricle: Left ventricular ejection fraction, by estimation, is 40  ?to 45%. The left ventricle has mildly decreased function. The left  ?ventricle demonstrates global hypokinesis. The left ventricular internal  ?cavity size was mildly dilated. There is  ? mild left ventricular hypertrophy. Left ventricular diastolic function  ?could not be evaluated due to atrial fibrillation. Left ventricular  ?diastolic function could not be evaluated.  ?Right Ventricle: The right ventricular size is mildly enlarged. Right  ?vetricular wall thickness was not well visualized. Right ventricular  ?systolic function is mildly reduced. There is mildly elevated pulmonary  ?artery systolic pressure. The tricuspid   ?regurgitant velocity is 2.30 m/s, and with an assumed right atrial  ?pressure of 15 mmHg, the estimated right ventricular systolic pressure is  ?23.7 mmHg.  ?Left Atrium: Left atrial size was mildly dilated.  ?Right At

## 2022-01-28 ENCOUNTER — Ambulatory Visit: Payer: Medicare PPO | Admitting: Nurse Practitioner

## 2022-01-28 ENCOUNTER — Encounter: Payer: Self-pay | Admitting: Nurse Practitioner

## 2022-01-28 ENCOUNTER — Encounter: Payer: Self-pay | Admitting: Genetic Counselor

## 2022-01-28 VITALS — BP 160/78 | HR 55 | Ht 67.0 in | Wt 212.0 lb

## 2022-01-28 DIAGNOSIS — Z7901 Long term (current) use of anticoagulants: Secondary | ICD-10-CM

## 2022-01-28 DIAGNOSIS — Z01810 Encounter for preprocedural cardiovascular examination: Secondary | ICD-10-CM

## 2022-01-28 DIAGNOSIS — I5022 Chronic systolic (congestive) heart failure: Secondary | ICD-10-CM | POA: Diagnosis not present

## 2022-01-28 DIAGNOSIS — I10 Essential (primary) hypertension: Secondary | ICD-10-CM

## 2022-01-28 DIAGNOSIS — I4819 Other persistent atrial fibrillation: Secondary | ICD-10-CM

## 2022-01-28 DIAGNOSIS — E785 Hyperlipidemia, unspecified: Secondary | ICD-10-CM | POA: Diagnosis not present

## 2022-01-28 MED ORDER — TELMISARTAN 80 MG PO TABS: 40.0000 mg | ORAL_TABLET | Freq: Every day | ORAL | 3 refills | Status: DC

## 2022-01-28 NOTE — Patient Instructions (Signed)
Medication Instructions:  ?Your physician has recommended you make the following change in your medication:  ? RESUME Telmisartan 80 mg taking 1/2 tablet daily  ? ?*If you need a refill on your cardiac medications before your next appointment, please call your pharmacy* ? ? ?Lab Work: ? None ordered ? ?If you have labs (blood work) drawn today and your tests are completely normal, you will receive your results only by: ?MyChart Message (if you have MyChart) OR ?A paper copy in the mail ?If you have any lab test that is abnormal or we need to change your treatment, we will call you to review the results. ? ? ?Testing/Procedures: ?None ordered ? ? ?Follow-Up: ?At [facility], you and your health needs are our priority.  As part of our continuing mission to provide you with exceptional heart care, we have created designated Provider Care Teams.  These Care Teams include your primary Cardiologist (physician) and Advanced Practice Providers (APPs -  Physician Assistants and Nurse Practitioners) who all work together to provide you with the care you need, when you need it. ? ?We recommend signing up for the patient portal called "MyChart".  Sign up information is provided on this After Visit Summary.  MyChart is used to connect with patients for Virtual Visits (Telemedicine).  Patients are able to view lab/test results, encounter notes, upcoming appointments, etc.  Non-urgent messages can be sent to your provider as well.   ?To learn more about what you can do with MyChart, go to [email or web address].   ? ?Your next appointment:   ?03/20/22 ARRIVE AT 11:05 ? ?The format for your next appointment:   ?In Person ? ?Provider:   ?Christen Bame, NP  ? ? ?Other Instructions ? ? ?Important Information About Sugar ? ? ? ? ?  ?

## 2022-01-29 DIAGNOSIS — I480 Paroxysmal atrial fibrillation: Secondary | ICD-10-CM | POA: Diagnosis not present

## 2022-01-29 MED ORDER — TELMISARTAN 40 MG PO TABS: 40.0000 mg | ORAL_TABLET | Freq: Every day | ORAL | 1 refills | Status: DC

## 2022-01-29 NOTE — Progress Notes (Signed)
Surgical Instructions ? ? ? Your procedure is scheduled on Thursday, April 20th, 2023. ? ? Report to [facility] Main Entrance "A" at 07:00 A.M., then check in with the Admitting office. ? Call this number if you have problems the morning of surgery: ? [phone] ? ? If you have any questions prior to your surgery date call [phone]: Open Monday-Friday 8am-4pm ? ? ? Remember: ? Do not eat after midnight the night before your surgery ? ?You may drink clear liquids until 06:00 the morning of your surgery.   ?Clear liquids allowed are: Water, Non-Citrus Juices (without pulp), Carbonated Beverages, Clear Tea, Black Coffee ONLY (NO MILK, CREAM OR POWDERED CREAMER of any kind), and Gatorade ?  ? Take these medicines the morning of surgery with A SIP OF WATER:  ? ?diltiazem (CARDIZEM CD) ?metoprolol succinate (TOPROL-XL) ?rosuvastatin (CRESTOR)  ? ?If needed: ? ?acetaminophen (TYLENOL)  ? ? ?Follow your surgeon's instructions on when to stop Eliquis.  If no instructions were given by your surgeon then you will need to call the office to get those instructions.    ? ?As of today, STOP taking any Aspirin (unless otherwise instructed by your surgeon) Aleve, Naproxen, Ibuprofen, Motrin, Advil, Goody's, BC's, all herbal medications, fish oil, and all vitamins. ? ? ? The day of surgery: ?         ?Do not wear jewelry or makeup ?Do not wear lotions, powders, perfumes, or deodorant. ?Do not shave 48 hours prior to surgery.   ?Do not bring valuables to the hospital. ?Do not wear nail polish, gel polish, artificial nails, or any other type of covering on natural nails (fingers and toes) ?If you have artificial nails or gel coating that need to be removed by a nail salon, please have this removed prior to surgery. Artificial nails or gel coating may interfere with anesthesia's ability to adequately monitor your vital signs. ? ?[employer] is not responsible for any belongings or valuables. .  ? ?Do NOT Smoke (Tobacco/Vaping)  24  hours prior to your procedure ? ?If you use a CPAP at night, you may bring your mask for your overnight stay. ?  ?Contacts, glasses, hearing aids, dentures or partials may not be worn into surgery, please bring cases for these belongings ?  ?For patients admitted to the hospital, discharge time will be determined by your treatment team. ?  ?Patients discharged the day of surgery will not be allowed to drive home, and someone needs to stay with them for 24 hours. ? ? ?SURGICAL WAITING ROOM VISITATION ?Patients having surgery or a procedure in a hospital may have two support people. ?Children under the age of 56 must have an adult with them who is not the patient. ?They may stay in the waiting area during the procedure and may switch out with other visitors. If the patient needs to stay at the hospital during part of their recovery, the visitor guidelines for inpatient rooms apply. ? ?Please refer to the [facility] website for the visitor guidelines for Inpatients (after your surgery is over and you are in a regular room).  ? ? ?Special instructions:   ? ?Oral Hygiene is also important to reduce your risk of infection.  Remember - BRUSH YOUR TEETH THE MORNING OF SURGERY WITH YOUR REGULAR TOOTHPASTE ? ? ?[facility]- Preparing For Surgery ? ?Before surgery, you can play an important role. Because skin is not sterile, your skin needs to be as free of germs as possible. You can reduce  the number of germs on your skin by washing with CHG (chlorahexidine gluconate) Soap before surgery.  CHG is an antiseptic cleaner which kills germs and bonds with the skin to continue killing germs even after washing.   ? ? ?Please do not use if you have an allergy to CHG or antibacterial soaps. If your skin becomes reddened/irritated stop using the CHG.  ?Do not shave (including legs and underarms) for at least 48 hours prior to first CHG shower. It is OK to shave your face. ? ?Please follow these instructions carefully. ?  ? ? Shower the  NIGHT BEFORE SURGERY and the MORNING OF SURGERY with CHG Soap.  ? If you chose to wash your hair, wash your hair first as usual with your normal shampoo. After you shampoo, rinse your hair and body thoroughly to remove the shampoo.  Then [employer] and genitals (private parts) with your normal soap and rinse thoroughly to remove soap. ? ?After that Use CHG Soap as you would any other liquid soap. You can apply CHG directly to the skin and wash gently with a scrungie or a clean washcloth.  ? ?Apply the CHG Soap to your body ONLY FROM THE NECK DOWN.  Do not use on open wounds or open sores. Avoid contact with your eyes, ears, mouth and genitals (private parts). Wash Face and genitals (private parts)  with your normal soap.  ? ?Wash thoroughly, paying special attention to the area where your surgery will be performed. ? ?Thoroughly rinse your body with warm water from the neck down. ? ?DO NOT shower/wash with your normal soap after using and rinsing off the CHG Soap. ? ?Pat yourself dry with a CLEAN TOWEL. ? ?Wear CLEAN PAJAMAS to bed the night before surgery ? ?Place CLEAN SHEETS on your bed the night before your surgery ? ?DO NOT SLEEP WITH PETS. ? ? ?Day of Surgery: ? ?Take a shower with CHG soap. ?Wear Clean/Comfortable clothing the morning of surgery ?Do not apply any deodorants/lotions.   ?Remember to brush your teeth WITH YOUR REGULAR TOOTHPASTE. ? ? ? ?If you received a COVID test during your pre-op visit  it is requested that you wear a mask when out in public, stay away from anyone that may not be feeling well and notify your surgeon if you develop symptoms. If you have been in contact with anyone that has tested positive in the last 10 days please notify you surgeon. ? ?  ?Please read over the following fact sheets that you were given.   ?

## 2022-01-30 ENCOUNTER — Encounter (HOSPITAL_COMMUNITY): Payer: Self-pay

## 2022-01-30 ENCOUNTER — Other Ambulatory Visit: Payer: Self-pay

## 2022-01-30 ENCOUNTER — Encounter (HOSPITAL_COMMUNITY)
Admission: RE | Admit: 2022-01-30 | Discharge: 2022-01-30 | Disposition: A | Discharge status: Home / Self Care | Payer: Medicare PPO | Source: Ambulatory Visit | Attending: Surgery | Admitting: Surgery

## 2022-01-30 VITALS — BP 160/77 | HR 57 | Temp 98.1°F | Resp 17 | Ht 67.0 in | Wt 211.3 lb

## 2022-01-30 DIAGNOSIS — I110 Hypertensive heart disease with heart failure: Secondary | ICD-10-CM | POA: Diagnosis not present

## 2022-01-30 DIAGNOSIS — I5022 Chronic systolic (congestive) heart failure: Secondary | ICD-10-CM | POA: Insufficient documentation

## 2022-01-30 DIAGNOSIS — E785 Hyperlipidemia, unspecified: Secondary | ICD-10-CM | POA: Diagnosis not present

## 2022-01-30 DIAGNOSIS — G4733 Obstructive sleep apnea (adult) (pediatric): Secondary | ICD-10-CM | POA: Diagnosis not present

## 2022-01-30 DIAGNOSIS — Z79899 Other long term (current) drug therapy: Secondary | ICD-10-CM | POA: Insufficient documentation

## 2022-01-30 DIAGNOSIS — Z7901 Long term (current) use of anticoagulants: Secondary | ICD-10-CM | POA: Insufficient documentation

## 2022-01-30 DIAGNOSIS — I10 Essential (primary) hypertension: Secondary | ICD-10-CM

## 2022-01-30 DIAGNOSIS — I482 Chronic atrial fibrillation, unspecified: Secondary | ICD-10-CM | POA: Diagnosis not present

## 2022-01-30 DIAGNOSIS — Z01818 Encounter for other preprocedural examination: Secondary | ICD-10-CM

## 2022-01-30 DIAGNOSIS — Z01812 Encounter for preprocedural laboratory examination: Secondary | ICD-10-CM | POA: Insufficient documentation

## 2022-01-30 HISTORY — DX: Anxiety disorder, unspecified: F41.9

## 2022-01-30 LAB — CBC
HCT: 42.7 % (ref 36.0–46.0)
Hemoglobin: 14.2 g/dL (ref 12.0–15.0)
MCH: 30.2 pg (ref 26.0–34.0)
MCHC: 33.3 g/dL (ref 30.0–36.0)
MCV: 90.9 fL (ref 80.0–100.0)
Platelets: 231 K/uL (ref 150–400)
RBC: 4.7 MIL/uL (ref 3.87–5.11)
RDW: 13.5 % (ref 11.5–15.5)
WBC: 10.3 K/uL (ref 4.0–10.5)
nRBC: 0 % (ref 0.0–0.2)

## 2022-01-30 LAB — BASIC METABOLIC PANEL WITH GFR
Anion gap: 7 (ref 5–15)
BUN: 14 mg/dL (ref 8–23)
CO2: 28 mmol/L (ref 22–32)
Calcium: 9.3 mg/dL (ref 8.9–10.3)
Chloride: 106 mmol/L (ref 98–111)
Creatinine, Ser: 0.83 mg/dL (ref 0.44–1.00)
GFR, Estimated: >60 mL/min
Glucose, Bld: 109 mg/dL — ABNORMAL HIGH (ref 70–99)
Potassium: 3.9 mmol/L (ref 3.5–5.1)
Sodium: 141 mmol/L (ref 135–145)

## 2022-01-30 NOTE — Progress Notes (Signed)
Anesthesia Chart Review: ? ?[place] cardiology for history of HTN, A-fib on Eliquis, HFrEF (EF 40 to 45% by echo 12/2021) HLD, OSA on CPAP.  She was recently scheduled for surgery on 12/26/2021 but presented to surgery center in A-fib with RVR. She was given esmolol and metoprolol and advised to present to [facility] ED for cardiology evaluation and underwent cardioversion.  Last seen by Stephan Minister, NP on 01/28/2022 for preop evaluation.  Per note, "She can achieve greater than 4 METS activity on a consistent basis. Mild DOE on occasion.  Cardiac monitor still outstanding.  It seems that A-fib may have returned, however rate is well controlled.  Per Dr. Marlou Porch she may proceed to surgery as long as rate remains controlled. RCRI for MACE is 0.9%. Will hold Eliquis 2 days prior and resume as soon as hemodynamically stable from surgeon's perspective." ? ?Patient reports last dose Eliquis 01/29/2022. ? ?Preop labs reviewed, unremarkable. ? ?TEE 12/31/2021: ? 1. Left ventricular ejection fraction, by estimation, is 40 to 45%. The  ?left ventricle has mildly decreased function.  ? 2. Right ventricular systolic function is mildly reduced. The right  ?ventricular size is normal.  ? 3. Left atrial size was mildly dilated. No left atrial/left atrial  ?appendage thrombus was detected.  ? 4. Right atrial size was mildly dilated.  ? 5. A small pericardial effusion is present.  ? 6. The mitral valve is normal in structure. Mild mitral valve  ?regurgitation.  ? 7. Tricuspid valve regurgitation is mild to moderate.  ? 8. The aortic valve is tricuspid. Aortic valve regurgitation is not  ?visualized.  ? 9. Aortic dilatation noted. There is mild dilatation of the ascending  ?aorta, measuring 38 mm.  ? ?Conclusion(s)/Recommendation(s): No LA/LAA thrombus identified. Successful  ?cardioversion performed with restoration of normal sinus rhythm.  ? ?Nuclear stress 07/06/2015: ??   Nuclear stress EF: 70%. ??   There was no ST segment deviation  noted during stress. ??   The study is normal. No ischemia. No evidence of infarction ??   This is a low risk study. ?  ? ? ?Karoline Caldwell, PA-C ?[facility] Short Stay Center/Anesthesiology ?Phone [phone] ?01/30/2022 1:14 PM ? ?

## 2022-01-30 NOTE — Progress Notes (Signed)
PCP - Dr. Lavone Orn ?Cardiologist - Dr. Candee Furbish ? ?PPM/ICD - denies ? ? ?Chest x-ray - 12/02/21 [facility] ?EKG - 01/09/22 ?Stress Test - 07/06/15 ?ECHO - 12/31/21 ?Cardiac Cath - denies ? ?Sleep Study - OSA+ ?CPAP - nightly ? ?DM- denies ? ?Blood Thinner Instructions: Hold Eliquis 2 days prior. Last dose 4/18 ?Aspirin Instructions: n/a ? ?ERAS Protcol - yes, no drink ? ? ?COVID TEST- n/a ? ? ?Anesthesia review: yes, cardiac hx ? ?Patient denies shortness of breath, fever, cough and chest pain at PAT appointment ? ? ?All instructions explained to the patient, with a verbal understanding of the material. Patient agrees to go over the instructions while at home for a better understanding. Patient also instructed to notify surgeon of any COVID contact or symptoms. The opportunity to ask questions was provided. ?  ?

## 2022-01-30 NOTE — Anesthesia Preprocedure Evaluation (Addendum)
Anesthesia Evaluation  ?Patient identified by MRN, date of birth, ID band ?Patient awake ? ? ? ?Reviewed: ?Allergy & Precautions, NPO status , Patient's Chart, lab work & pertinent test results ? ?Airway ?Mallampati: II ? ?TM Distance: >3 FB ?Neck ROM: Full ? ? ? Dental ?no notable dental hx. ? ?  ?Pulmonary ?sleep apnea and Continuous Positive Airway Pressure Ventilation ,  ?  ?Pulmonary exam normal ?breath sounds clear to auscultation ? ? ? ? ? ? Cardiovascular ?hypertension, +CHF  ?Normal cardiovascular exam+ dysrhythmias (on Eliquis) Atrial Fibrillation  ?Rhythm:Regular Rate:Normal ? ? ?  ?Neuro/Psych ?PSYCHIATRIC DISORDERS Anxiety negative neurological ROS ?   ? GI/Hepatic ?negative GI ROS, Neg liver ROS,   ?Endo/Other  ?negative endocrine ROS ? Renal/GU ?negative Renal ROS  ?negative genitourinary ?  ?Musculoskeletal ? ?(+) Arthritis , Osteoarthritis,   ? Abdominal ?  ?Peds ?negative pediatric ROS ?(+)  Hematology ?negative hematology ROS ?(+)   ?Anesthesia Other Findings ?H/o breast cancer ? Reproductive/Obstetrics ?negative OB ROS ? ?  ? ? ? ? ? ? ? ? ? ? ? ? ? ?  ?  ? ? ? ? ? ? ? ?Anesthesia Physical ?Anesthesia Plan ? ?ASA: 2 ? ?Anesthesia Plan: General  ? ?Post-op Pain Management: Tylenol PO (pre-op)*  ? ?Induction: Intravenous ? ?PONV Risk Score and Plan: 3 and Treatment may vary due to age or medical condition, Ondansetron and Dexamethasone ? ?Airway Management Planned: LMA ? ?Additional Equipment: None ? ?Intra-op Plan:  ? ?Post-operative Plan: Extubation in OR ? ?Informed Consent: I have reviewed the patients History and Physical, chart, labs and discussed the procedure including the risks, benefits and alternatives for the proposed anesthesia with the patient or authorized representative who has indicated his/her understanding and acceptance.  ? ? ? ?Dental advisory given ? ?Plan Discussed with: CRNA, Anesthesiologist and Surgeon ? ?Anesthesia Plan Comments: (PAT  note by Karoline Caldwell, PA-C: ?[place] cardiology for history of HTN, A-fib on Eliquis, HFrEF (EF 40 to 45% by echo 12/2021) HLD, OSA on CPAP.  She was recently scheduled for surgery on 12/26/2021 but presented to surgery center in A-fib with RVR. She was given esmolol and metoprolol and advised to present to [facility] ED for cardiology evaluation and underwent cardioversion.  Last seen by Stephan Minister, NP on 01/28/2022 for preop evaluation.  Per note, "She can achieve greater than 4 METS activity on a consistent basis. Mild DOE on occasion.  Cardiac monitor still outstanding.  It seems that A-fib may have returned, however rate is well controlled.  Per Dr. Marlou Porch she may proceed to surgery as long as rate remains controlled. RCRI for MACE is 0.9%. Will hold Eliquis 2 days prior and resume as soon as hemodynamically stable from surgeon's perspective." ? ?Patient reports last dose Eliquis 01/29/2022. ? ?Preop labs reviewed, unremarkable. ? ?TEE 12/31/2021: ??1. Left ventricular ejection fraction, by estimation, is 40 to 45%. The  ?left ventricle has mildly decreased function.  ??2. Right ventricular systolic function is mildly reduced. The right  ?ventricular size is normal.  ??3. Left atrial size was mildly dilated. No left atrial/left atrial  ?appendage thrombus was detected.  ??4. Right atrial size was mildly dilated.  ??5. A small pericardial effusion is present.  ??6. The mitral valve is normal in structure. Mild mitral valve  ?regurgitation.  ??7. Tricuspid valve regurgitation is mild to moderate.  ??8. The aortic valve is tricuspid. Aortic valve regurgitation is not  ?visualized.  ??9. Aortic dilatation noted. There is mild dilatation of  the ascending  ?aorta, measuring 38 mm.  ? ?Conclusion(s)/Recommendation(s): No LA/LAA thrombus identified. Successful  ?cardioversion performed with restoration of normal sinus rhythm.  ? ?Nuclear stress 07/06/2015: ?? ? Nuclear stress EF: 70%. ?? ? There was no ST segment deviation  noted during stress. ?? ? The study is normal. No ischemia. No evidence of infarction ?? ? This is a low risk study. ?? ?)  ? ? ? ? ? ?Anesthesia Quick Evaluation ? ?

## 2022-01-31 ENCOUNTER — Ambulatory Visit
Admit: 2022-01-31 | Discharge: 2022-01-31 | Disposition: A | Discharge status: Home / Self Care | Payer: Medicare PPO | Attending: Surgery | Admitting: Surgery

## 2022-01-31 ENCOUNTER — Encounter (HOSPITAL_COMMUNITY): Payer: Self-pay | Admitting: Surgery

## 2022-01-31 ENCOUNTER — Ambulatory Visit (HOSPITAL_COMMUNITY): Payer: Medicare PPO | Admitting: Physician Assistant

## 2022-01-31 ENCOUNTER — Other Ambulatory Visit: Payer: Self-pay

## 2022-01-31 ENCOUNTER — Ambulatory Visit (HOSPITAL_COMMUNITY)
Admission: RE | Admit: 2022-01-31 | Discharge: 2022-01-31 | Disposition: A | Discharge status: Home / Self Care | Payer: Medicare PPO | Source: Ambulatory Visit | Attending: Surgery | Admitting: Surgery

## 2022-01-31 ENCOUNTER — Encounter (HOSPITAL_COMMUNITY)
Admission: RE | Disposition: A | Discharge status: Home / Self Care | Payer: Self-pay | Source: Ambulatory Visit | Attending: Surgery

## 2022-01-31 ENCOUNTER — Ambulatory Visit (HOSPITAL_BASED_OUTPATIENT_CLINIC_OR_DEPARTMENT_OTHER): Payer: Medicare PPO | Admitting: Anesthesiology

## 2022-01-31 DIAGNOSIS — I110 Hypertensive heart disease with heart failure: Secondary | ICD-10-CM | POA: Diagnosis not present

## 2022-01-31 DIAGNOSIS — N62 Hypertrophy of breast: Secondary | ICD-10-CM | POA: Diagnosis not present

## 2022-01-31 DIAGNOSIS — C50911 Malignant neoplasm of unspecified site of right female breast: Secondary | ICD-10-CM | POA: Diagnosis not present

## 2022-01-31 DIAGNOSIS — I509 Heart failure, unspecified: Secondary | ICD-10-CM | POA: Insufficient documentation

## 2022-01-31 DIAGNOSIS — Z7901 Long term (current) use of anticoagulants: Secondary | ICD-10-CM | POA: Insufficient documentation

## 2022-01-31 DIAGNOSIS — G4730 Sleep apnea, unspecified: Secondary | ICD-10-CM | POA: Insufficient documentation

## 2022-01-31 DIAGNOSIS — N6011 Diffuse cystic mastopathy of right breast: Secondary | ICD-10-CM | POA: Diagnosis not present

## 2022-01-31 DIAGNOSIS — C50411 Malignant neoplasm of upper-outer quadrant of right female breast: Secondary | ICD-10-CM | POA: Insufficient documentation

## 2022-01-31 DIAGNOSIS — I4891 Unspecified atrial fibrillation: Secondary | ICD-10-CM | POA: Insufficient documentation

## 2022-01-31 DIAGNOSIS — Z170 Estrogen receptor positive status [ER+]: Secondary | ICD-10-CM | POA: Diagnosis not present

## 2022-01-31 DIAGNOSIS — N6081 Other benign mammary dysplasias of right breast: Secondary | ICD-10-CM | POA: Diagnosis not present

## 2022-01-31 HISTORY — PX: BREAST LUMPECTOMY WITH RADIOACTIVE SEED LOCALIZATION: SHX6424

## 2022-01-31 HISTORY — PX: BREAST LUMPECTOMY: SHX2

## 2022-01-31 SURGERY — BREAST LUMPECTOMY WITH RADIOACTIVE SEED LOCALIZATION
Anesthesia: General | Site: Breast | Laterality: Right

## 2022-01-31 MED ORDER — OXYCODONE HCL 5 MG/5ML PO SOLN: 5.0000 mg | Freq: Once | ORAL | Status: DC | PRN

## 2022-01-31 MED ORDER — CHLORHEXIDINE GLUCONATE 0.12 % MT SOLN: 15.0000 mL | Freq: Once | OROMUCOSAL | Status: AC

## 2022-01-31 MED ORDER — AMISULPRIDE (ANTIEMETIC) 5 MG/2ML IV SOLN: 10.0000 mg | Freq: Once | INTRAVENOUS | Status: DC | PRN

## 2022-01-31 MED ORDER — ORAL CARE MOUTH RINSE: 15.0000 mL | Freq: Once | OROMUCOSAL | Status: AC

## 2022-01-31 MED ORDER — DEXAMETHASONE SODIUM PHOSPHATE 10 MG/ML IJ SOLN
INTRAMUSCULAR | Status: AC
  Filled 2022-01-31: qty 1

## 2022-01-31 MED ORDER — CLINDAMYCIN PHOSPHATE 900 MG/50ML IV SOLN
INTRAVENOUS | Status: AC
  Filled 2022-01-31: qty 50

## 2022-01-31 MED ORDER — DEXAMETHASONE SODIUM PHOSPHATE 10 MG/ML IJ SOLN
INTRAMUSCULAR | Status: DC | PRN
  Administered 2022-01-31: 10 mg via INTRAVENOUS

## 2022-01-31 MED ORDER — ACETAMINOPHEN 500 MG PO TABS
ORAL_TABLET | ORAL | Status: AC
  Administered 2022-01-31: 1000 mg
  Filled 2022-01-31: qty 2

## 2022-01-31 MED ORDER — PROPOFOL 500 MG/50ML IV EMUL
INTRAVENOUS | Status: DC | PRN
  Administered 2022-01-31: 100 ug/kg/min via INTRAVENOUS

## 2022-01-31 MED ORDER — PROPOFOL 10 MG/ML IV BOLUS
INTRAVENOUS | Status: AC
  Filled 2022-01-31: qty 20

## 2022-01-31 MED ORDER — FENTANYL CITRATE (PF) 250 MCG/5ML IJ SOLN
INTRAMUSCULAR | Status: DC | PRN
  Administered 2022-01-31 (×2): 25 ug via INTRAVENOUS

## 2022-01-31 MED ORDER — ONDANSETRON HCL 4 MG/2ML IJ SOLN: 4.0000 mg | Freq: Once | INTRAMUSCULAR | Status: DC | PRN

## 2022-01-31 MED ORDER — OXYCODONE HCL 5 MG PO TABS: 5.0000 mg | ORAL_TABLET | Freq: Four times a day (QID) | ORAL | 0 refills | Status: DC | PRN

## 2022-01-31 MED ORDER — FENTANYL CITRATE (PF) 100 MCG/2ML IJ SOLN: 25.0000 ug | INTRAMUSCULAR | Status: DC | PRN

## 2022-01-31 MED ORDER — ACETAMINOPHEN 500 MG PO TABS: 1000.0000 mg | ORAL_TABLET | ORAL | Status: AC

## 2022-01-31 MED ORDER — ONDANSETRON HCL 4 MG/2ML IJ SOLN
INTRAMUSCULAR | Status: DC | PRN
  Administered 2022-01-31: 4 mg via INTRAVENOUS

## 2022-01-31 MED ORDER — 0.9 % SODIUM CHLORIDE (POUR BTL) OPTIME
TOPICAL | Status: DC | PRN
Start: 2022-01-31 — End: 2022-01-31
  Administered 2022-01-31: 1000 mL

## 2022-01-31 MED ORDER — CHLORHEXIDINE GLUCONATE CLOTH 2 % EX PADS: 6.0000 | MEDICATED_PAD | Freq: Once | CUTANEOUS | Status: DC

## 2022-01-31 MED ORDER — CLINDAMYCIN PHOSPHATE 900 MG/50ML IV SOLN
900.0000 mg | INTRAVENOUS | Status: AC
  Administered 2022-01-31: 900 mg via INTRAVENOUS

## 2022-01-31 MED ORDER — LACTATED RINGERS IV SOLN: INTRAVENOUS | Status: DC

## 2022-01-31 MED ORDER — FENTANYL CITRATE (PF) 250 MCG/5ML IJ SOLN
INTRAMUSCULAR | Status: AC
  Filled 2022-01-31: qty 5

## 2022-01-31 MED ORDER — BUPIVACAINE-EPINEPHRINE 0.25% -1:200000 IJ SOLN
INTRAMUSCULAR | Status: DC | PRN
  Administered 2022-01-31: 10 mL

## 2022-01-31 MED ORDER — EPHEDRINE SULFATE-NACL 50-0.9 MG/10ML-% IV SOSY
PREFILLED_SYRINGE | INTRAVENOUS | Status: DC | PRN
  Administered 2022-01-31 (×4): 2.5 mg via INTRAVENOUS

## 2022-01-31 MED ORDER — ONDANSETRON HCL 4 MG/2ML IJ SOLN
INTRAMUSCULAR | Status: AC
  Filled 2022-01-31: qty 2

## 2022-01-31 MED ORDER — LIDOCAINE 2% (20 MG/ML) 5 ML SYRINGE
INTRAMUSCULAR | Status: DC | PRN
  Administered 2022-01-31: 80 mg via INTRAVENOUS

## 2022-01-31 MED ORDER — OXYCODONE HCL 5 MG PO TABS: 5.0000 mg | ORAL_TABLET | Freq: Once | ORAL | Status: DC | PRN

## 2022-01-31 MED ORDER — PROPOFOL 10 MG/ML IV BOLUS
INTRAVENOUS | Status: DC | PRN
  Administered 2022-01-31: 150 mg via INTRAVENOUS

## 2022-01-31 MED ORDER — CHLORHEXIDINE GLUCONATE 0.12 % MT SOLN
OROMUCOSAL | Status: AC
  Administered 2022-01-31: 15 mL
  Filled 2022-01-31: qty 15

## 2022-01-31 MED ORDER — LIDOCAINE 2% (20 MG/ML) 5 ML SYRINGE
INTRAMUSCULAR | Status: AC
  Filled 2022-01-31: qty 5

## 2022-01-31 SURGICAL SUPPLY — 43 items
ADH SKN CLS APL DERMABOND .7 (GAUZE/BANDAGES/DRESSINGS) ×1
APL PRP STRL LF DISP 70% ISPRP (MISCELLANEOUS) ×1
APPLIER CLIP 9.375 MED OPEN (MISCELLANEOUS)
APR CLP MED 9.3 20 MLT OPN (MISCELLANEOUS)
BAG COUNTER SPONGE SURGICOUNT (BAG) ×2 IMPLANT
BAG SPNG CNTER NS LX DISP (BAG) ×1
BINDER BREAST LRG (GAUZE/BANDAGES/DRESSINGS) IMPLANT
BINDER BREAST XLRG (GAUZE/BANDAGES/DRESSINGS) ×1 IMPLANT
CANISTER SUCT 3000ML PPV (MISCELLANEOUS) IMPLANT
CHLORAPREP W/TINT 26 (MISCELLANEOUS) ×2 IMPLANT
CLIP APPLIE 9.375 MED OPEN (MISCELLANEOUS) IMPLANT
COVER PROBE W GEL 5X96 (DRAPES) ×2 IMPLANT
COVER SURGICAL LIGHT HANDLE (MISCELLANEOUS) ×2 IMPLANT
DERMABOND ADVANCED (GAUZE/BANDAGES/DRESSINGS) ×1
DERMABOND ADVANCED .7 DNX12 (GAUZE/BANDAGES/DRESSINGS) ×1 IMPLANT
DEVICE DUBIN SPECIMEN MAMMOGRA (MISCELLANEOUS) ×2 IMPLANT
DRAPE CHEST BREAST 15X10 FENES (DRAPES) ×2 IMPLANT
ELECT CAUTERY BLADE 6.4 (BLADE) ×2 IMPLANT
ELECT REM PT RETURN 9FT ADLT (ELECTROSURGICAL) ×2
ELECTRODE REM PT RTRN 9FT ADLT (ELECTROSURGICAL) ×1 IMPLANT
GLOVE BIO SURGEON STRL SZ8 (GLOVE) ×2 IMPLANT
GLOVE BIOGEL PI IND STRL 8 (GLOVE) ×1 IMPLANT
GLOVE BIOGEL PI INDICATOR 8 (GLOVE) ×1
GOWN STRL REUS W/ TWL LRG LVL3 (GOWN DISPOSABLE) ×1 IMPLANT
GOWN STRL REUS W/ TWL XL LVL3 (GOWN DISPOSABLE) ×1 IMPLANT
GOWN STRL REUS W/TWL LRG LVL3 (GOWN DISPOSABLE) ×2
GOWN STRL REUS W/TWL XL LVL3 (GOWN DISPOSABLE) ×2
KIT BASIN OR (CUSTOM PROCEDURE TRAY) ×2 IMPLANT
KIT MARKER MARGIN INK (KITS) IMPLANT
LIGHT WAVEGUIDE WIDE FLAT (MISCELLANEOUS) IMPLANT
NDL HYPO 25GX1X1/2 BEV (NEEDLE) IMPLANT
NEEDLE HYPO 25GX1X1/2 BEV (NEEDLE) IMPLANT
NS IRRIG 1000ML POUR BTL (IV SOLUTION) IMPLANT
PACK GENERAL/GYN (CUSTOM PROCEDURE TRAY) ×2 IMPLANT
SUT MNCRL AB 4-0 PS2 18 (SUTURE) ×2 IMPLANT
SUT SILK 2 0 SH (SUTURE) IMPLANT
SUT VIC AB 2-0 SH 27 (SUTURE)
SUT VIC AB 2-0 SH 27XBRD (SUTURE) IMPLANT
SUT VIC AB 3-0 SH 27 (SUTURE)
SUT VIC AB 3-0 SH 27X BRD (SUTURE) IMPLANT
SUT VIC AB 3-0 SH 8-18 (SUTURE) ×2 IMPLANT
SYR CONTROL 10ML LL (SYRINGE) IMPLANT
TOWEL GREEN STERILE FF (TOWEL DISPOSABLE) IMPLANT

## 2022-01-31 NOTE — H&P (Signed)
History of Present Illness: ?Shannon Liu is a 81 y.o. female who is seen today as an office consultation for evaluation of Breast Cancer ?.  ? ?Patient presents CBC for evaluation of right breast cancer. She was found to have a area of architectural distortion right breast upper outer quadrant measuring 8 mm. Core biopsy showed right breast ductal carcinoma with lobular features ER positive PR positive HER2 negative. No history of breast pain, breast mass or nipple discharge. ? ?Review of Systems: ?A complete review of systems was obtained from the patient. I have reviewed this information and discussed as appropriate with the patient. See HPI as well for other ROS. ? ? ? ?Medical History: ?Past Medical History:  ?Diagnosis Date  ? Anxiety  ? Arrhythmia  ? Hyperlipidemia  ? Hypertension  ? Sleep apnea  ? ?Patient Active Problem List  ?Diagnosis  ? Malignant neoplasm of upper-outer quadrant of right breast in female, estrogen receptor positive (CMS-HCC)  ? ?Past Surgical History:  ?Procedure Laterality Date  ? HYSTERECTOMY N/A  ?Date unknown  ? Neck Surgery N/A  ?Date Unknown  ? ? ?Allergies  ?Allergen Reactions  ? Morphine Nausea And Vomiting  ? Penicillins Nausea And Vomiting and Other (See Comments)  ? ?Current Outpatient Medications on File Prior to Visit  ?Medication Sig Dispense Refill  ? ALPRAZolam (XANAX) 0.25 MG tablet one tablet as needed  ? atorvastatin (LIPITOR) 40 MG tablet Take 1 tablet by mouth once daily  ? diltiazem (CARDIZEM CD) 120 MG XR capsule Take by mouth  ? acetaminophen (TYLENOL) 500 MG tablet Take by mouth  ? chlorthalidone 25 MG tablet TAKE ONE TABLET BY MOUTH EVERY MORNING WITH FOOD  ? diltiazem (CARDIZEM) 30 MG tablet Take 30 mg by mouth once daily as needed  ? ELIQUIS 5 mg tablet Take 5 mg by mouth 2 (two) times daily  ? multivitamin with minerals tablet Take 1 tablet by mouth once daily  ? potassium chloride (KLOR-CON) 10 mEq ER tablet Take 1 tablet by mouth once daily  ? propylene  glycoL (SYSTANE BALANCE) 0.6 % ophthalmic drops Apply 1 drop to eye at bedtime  ? telmisartan (MICARDIS) 80 MG tablet Take 1 tablet by mouth once daily  ? ?No current facility-administered medications on file prior to visit.  ? ?Family History  ?Problem Relation Age of Onset  ? Skin cancer Mother  ? High blood pressure (Hypertension) Mother  ? High blood pressure (Hypertension) Father  ? Coronary Artery Disease (Blocked arteries around heart) Father  ? High blood pressure (Hypertension) Sister  ? Hyperlipidemia (Elevated cholesterol) Sister  ? Diabetes Sister  ? High blood pressure (Hypertension) Brother  ? Diabetes Brother  ? Colon cancer Maternal Grandmother  ? ? ?Social History  ? ?Tobacco Use  ?Smoking Status Never  ?Smokeless Tobacco Never  ? ? ?Social History  ? ?Socioeconomic History  ? Marital status: Married  ?Tobacco Use  ? Smoking status: Never  ? Smokeless tobacco: Never  ?Vaping Use  ? Vaping Use: Never used  ?Substance and Sexual Activity  ? Alcohol use: Never  ? Drug use: Never  ? ?Objective:  ?There were no vitals filed for this visit.  ?There is no height or weight on file to calculate BMI. ? ?Physical Exam ?Constitutional:  ?Appearance: Normal appearance.  ?Eyes:  ?Extraocular Movements: Extraocular movements intact.  ?Pupils: Pupils are equal, round, and reactive to light.  ?Cardiovascular:  ?Rate and Rhythm: Normal rate.  ?Pulmonary:  ?Effort: Pulmonary effort is normal.  ?Breath  sounds: No stridor.  ?Chest:  ?Breasts: ?Right: No mass.  ?Left: Normal. No mass.  ? ?Comments: Bruising noted right breast ?Musculoskeletal:  ?General: Normal range of motion.  ?Cervical back: Normal range of motion.  ?Lymphadenopathy:  ?Upper Body:  ?Right upper body: No supraclavicular or axillary adenopathy.  ?Left upper body: No supraclavicular or axillary adenopathy.  ?Skin: ?General: Skin is warm.  ?Neurological:  ?General: No focal deficit present.  ?Mental Status: She is alert and oriented to person, place, and  time.  ?Psychiatric:  ?Mood and Affect: Mood normal.  ?Behavior: Behavior normal.  ? ? ? ?Labs, Imaging and Diagnostic Testing: ?Patient returns today to evaluate possible RIGHT ?breast asymmetries identified on recent screening mammogram. ?  ?EXAM: ?DIGITAL DIAGNOSTIC UNILATERAL RIGHT MAMMOGRAM WITH TOMOSYNTHESIS AND ?CAD; ULTRASOUND RIGHT BREAST LIMITED ?  ?TECHNIQUE: ?Right digital diagnostic mammography and breast tomosynthesis was ?performed. The images were evaluated with computer-aided detection.; ?Targeted ultrasound examination of the right breast was performed ?  ?COMPARISON: Previous exams including recent screening mammogram ?dated 11/05/2021 ?  ?ACR Breast Density Category b: There are scattered areas of ?fibroglandular density. ?  ?FINDINGS: ?On today's additional diagnostic views, an irregular mass is ?confirmed within the upper-outer quadrant of the RIGHT breast, with ?associated architectural distortion, measuring approximately 8 mm ?greatest dimension. ?  ?There is an additional partially obscured low-density mass within ?the lower inner quadrant of the RIGHT breast, measuring ?approximately 5 mm. ?  ?Targeted ultrasound is performed, showing an irregular hypoechoic ?mass in the RIGHT breast at the 10 o'clock axis, 7 cm from the ?nipple, measuring 9 x 6 x 9 mm, with associated architectural ?distortion, corresponding to the suspicious mammographic finding. ?  ?Adjacent to the mass is a benign dystrophic calcification at the 10 ?o'clock axis, 6 cm from the nipple, measuring 3 mm, corresponding as ?an incidental finding. ?  ?Several benign cysts are seen within the inner and lower RIGHT ?breast, 4-6 o'clock axes, largest measuring 9 mm, corresponding to ?the additional mammographic asymmetry. ?  ?RIGHT axilla was evaluated with ultrasound showing no enlarged or ?morphologically abnormal lymph nodes. ?  ?IMPRESSION: ?1. Irregular hypoechoic mass in the RIGHT breast at the 10 o'clock ?axis, 7 cm from  the nipple, measuring 9 mm, with associated ?architectural distortion, corresponding to the mammographic finding. ?This is a highly suspicious finding for which ultrasound-guided core ?biopsy is recommended. ?2. Additional benign findings within the RIGHT breast, as detailed ?above. ?3. No enlarged or morphologically abnormal lymph nodes in the RIGHT ?axilla. ?  ?RECOMMENDATION: ?Ultrasound-guided core biopsy for the irregular hypoechoic mass in ?the RIGHT breast at the 10 o'clock axis, 7 cm from the nipple, ?measuring 9 mm, with associated architectural distortion. ?  ?Ultrasound-guided biopsy is scheduled on February 23rd. ?  ?I have discussed the findings and recommendations with the patient. ?If applicable, a reminder letter will be sent to the patient ?regarding the next appointment. ?  ?BI-RADS CATEGORY 5: Highly suggestive of malignancy. ?  ?  ?Electronically Signed ?By: Franki Cabot M.D. ?On: 11/28/2021 11:38 ? ?Assessment and Plan:  ? ?Diagnoses and all orders for this visit: ? ?Malignant neoplasm of upper-outer quadrant of right breast in female, estrogen receptor positive (CMS-HCC) ? ? ? ?Discussed breast conserving surgery versus mastectomy reconstruction. Due to tumor size, low-grade and patient age, recommend right breast seed lumpectomy. I discussed the pros and cons of sentinel lymph node mapping and omission with review of recent literature. Risk of positive nodes 8% or less but more than likely would  not affect her treatment and/or outcome. Recommend holding her blood thinner for 2 days prior to surgery. Pros and cons of breast conserving surgery discussed most complications. She has agreed to proceed.The procedure has been discussed with the patient. Alternatives to surgery have been discussed with the patient. Risks of surgery include bleeding, Infection, Seroma formation, death, and the need for further surgery. The patient understands and wishes to proceed. ?

## 2022-01-31 NOTE — Op Note (Signed)
Preoperative diagnosis: Stage I right breast cancer upper outer quadrant ? ?Postoperative diagnosis: Same ? ?Procedure: Right breast seed localized lumpectomy ? ?Surgeon: Erroll Luna, MD ? ?Anesthesia: LMA with 0.25% Marcaine with epinephrine local ? ?EBL: Minimal ? ?Specimen: Right breast tissue with seed and clip verified by [facility] imaging.  Also, additional margins all shaved.  All tissue was oriented with ink. ? ?Drains: None ? ?Indications for procedure: The patient is a-year-old female with stage I right breast cancer.  She was initially scheduled for surgery last month but developed atrial fibrillation and had to have her date moved.  She presents today for right breast seed localized lumpectomy for stage I right breast cancer.  She was evaluated in the multidisciplinary clinic setting and has seen all consulting physicians.The procedure has been discussed with the patient. Alternatives to surgery have been discussed with the patient.  Risks of surgery include bleeding,  Infection,  Seroma formation, death,  and the need for further surgery.   The patient understands and wishes to proceed.  ? ? ? ? ?Description of procedure: The patient was met in the holding area and questions were answered.  Neoprobe was used to verify seed location and activity in the patient's right breast.  All questions were answered.  She was taken back to the operating room.  She was placed supine upon the OR table.  After induction of general esthesia, right breast was prepped and draped in a sterile fashion and timeout performed.  She received appropriate antibiotics.  Neoprobe was used to identify the seed right breast upper outer quadrant.  A curvilinear incision was made over this.  All tissue around the seed and clip were excised with grossly negative margins.  Hemostasis achieved with cautery and Arista.  Prior to this irrigation was used.  Local anesthetic was infiltrated throughout the cavity.  The cavity was closed with  3-0 Vicryl and 4 Monocryl.  Dermabond applied.  All counts found to be correct.  The patient was awoke extubated taken to recovery in satisfactory condition. ?

## 2022-01-31 NOTE — Discharge Instructions (Signed)
[facility] ?Office Phone Number [phone] ? ?BREAST BIOPSY/ PARTIAL MASTECTOMY: POST OP INSTRUCTIONS ? ?Always review your discharge instruction sheet given to you by the facility where your surgery was performed. ? ?IF YOU HAVE DISABILITY OR FAMILY LEAVE FORMS, YOU MUST BRING THEM TO THE OFFICE FOR PROCESSING.  DO NOT GIVE THEM TO YOUR DOCTOR. ? ?A prescription for pain medication may be given to you upon discharge.  Take your pain medication as prescribed, if needed.  If narcotic pain medicine is not needed, then you may take acetaminophen (Tylenol) or ibuprofen (Advil) as needed. ?Take your usually prescribed medications unless otherwise directed ?If you need a refill on your pain medication, please contact your pharmacy.  They will contact our office to request authorization.  Prescriptions will not be filled after 5pm or on week-ends. ?You should eat very light the first 24 hours after surgery, such as soup, crackers, pudding, etc.  Resume your normal diet the day after surgery. ?Most patients will experience some swelling and bruising in the breast.  Ice packs and a good support bra will help.  Swelling and bruising can take several days to resolve.  ?It is common to experience some constipation if taking pain medication after surgery.  Increasing fluid intake and taking a stool softener will usually help or prevent this problem from occurring.  A mild laxative (Milk of Magnesia or Miralax) should be taken according to package directions if there are no bowel movements after 48 hours. ?Unless discharge instructions indicate otherwise, you may remove your bandages 24-48 hours after surgery, and you may shower at that time.  You may have steri-strips (small skin tapes) in place directly over the incision.  These strips should be left on the skin for 7-10 days.  If your surgeon used skin glue on the incision, you may shower in 24 hours.  The glue will flake off over the next 2-3 weeks.  Any  sutures or staples will be removed at the office during your follow-up visit. ?ACTIVITIES:  You may resume regular daily activities (gradually increasing) beginning the next day.  Wearing a good support bra or sports bra minimizes pain and swelling.  You may have sexual intercourse when it is comfortable. ?You may drive when you no longer are taking prescription pain medication, you can comfortably wear a seatbelt, and you can safely maneuver your car and apply brakes. ?RETURN TO WORK:  ______________________________________________________________________________________ ?You should see your doctor in the office for a follow-up appointment approximately two weeks after your surgery.  Your doctor?s nurse will typically make your follow-up appointment when she calls you with your pathology report.  Expect your pathology report 2-3 business days after your surgery.  You may call to check if you do not hear from [place] after three days. ?OTHER INSTRUCTIONS: _______________________________________________________________________________________________ _____________________________________________________________________________________________________________________________________ ?_____________________________________________________________________________________________________________________________________ ?_____________________________________________________________________________________________________________________________________ ? ?WHEN TO CALL YOUR DOCTOR: ?Fever over 101.0 ?Nausea and/or vomiting. ?Extreme swelling or bruising. ?Continued bleeding from incision. ?Increased pain, redness, or drainage from the incision. ? ?The clinic staff is available to answer your questions during regular business hours.  Please don?t hesitate to call and ask to speak to one of the nurses for clinical concerns.  If you have a medical emergency, go to the nearest emergency room or call 911.  A surgeon from [facility] is always on call at the hospital. ? ?For further questions, please visit [email or web address]   ? ? ? ? ? ? ? ?RESTART BLOOD THINNER ON Saturday  ?

## 2022-01-31 NOTE — Anesthesia Postprocedure Evaluation (Signed)
Anesthesia Post Note ? ?Patient: Shannon Liu ? ?Procedure(s) Performed: RIGHT BREAST LUMPECTOMY WITH RADIOACTIVE SEED LOCALIZATION (Right: Breast) ? ?  ? ?Patient location during evaluation: PACU ?Anesthesia Type: General ?Level of consciousness: awake and alert ?Pain management: pain level controlled ?Vital Signs Assessment: post-procedure vital signs reviewed and stable ?Respiratory status: spontaneous breathing, nonlabored ventilation and respiratory function stable ?Cardiovascular status: blood pressure returned to baseline and stable ?Postop Assessment: no apparent nausea or vomiting ?Anesthetic complications: no ? ? ?No notable events documented. ? ?Last Vitals:  ?Vitals:  ? 01/31/22 1015 01/31/22 1030  ?BP: 117/64 133/69  ?Pulse: (!) 51 (!) 52  ?Resp: 11 14  ?Temp: 36.4 ?C   ?SpO2: 95% 94%  ?  ?Last Pain:  ?Vitals:  ? 01/31/22 1030  ?TempSrc:   ?PainSc: 0-No pain  ? ? ?  ?  ?  ?  ?  ?  ? ?Merlinda Frederick ? ? ? ? ?

## 2022-01-31 NOTE — Transfer of Care (Signed)
Immediate Anesthesia Transfer of Care Note ? ?Patient: Shannon Liu ? ?Procedure(s) Performed: RIGHT BREAST LUMPECTOMY WITH RADIOACTIVE SEED LOCALIZATION (Right: Breast) ? ?Patient Location: PACU ? ?Anesthesia Type:General ? ?Level of Consciousness: awake, alert , oriented, patient cooperative and responds to stimulation ? ?Airway & Oxygen Therapy: Patient Spontanous Breathing, sats 90% placed on 2LNC ? ?Post-op Assessment: Report given to RN and Post -op Vital signs reviewed and stable ? ?Post vital signs: Reviewed and stable ? ?Last Vitals:  ?Vitals Value Taken Time  ?BP 117/64 01/31/22 1013  ?Temp    ?Pulse 50 01/31/22 1013  ?Resp 13 01/31/22 1013  ?SpO2 94 % 01/31/22 1013  ?Vitals shown include unvalidated device data. ? ?Last Pain:  ?Vitals:  ? 01/31/22 0741  ?TempSrc:   ?PainSc: 0-No pain  ?   ? ?  ? ?Complications: No notable events documented. ?

## 2022-01-31 NOTE — Anesthesia Procedure Notes (Signed)
Procedure Name: LMA Insertion ?Date/Time: 01/31/2022 9:24 AM ?Performed by: Betha Loa, CRNA ?Pre-anesthesia Checklist: Patient identified, Emergency Drugs available, Suction available and Patient being monitored ?Patient Re-evaluated:Patient Re-evaluated prior to induction ?Oxygen Delivery Method: Circle System Utilized ?Preoxygenation: Pre-oxygenation with 100% oxygen ?Induction Type: IV induction ?Ventilation: Mask ventilation without difficulty ?LMA: LMA inserted ?LMA Size: 4.0 ?Number of attempts: 1 ?Airway Equipment and Method: Bite block ?Placement Confirmation: positive ETCO2 ?Tube secured with: Tape ?Dental Injury: Teeth and Oropharynx as per pre-operative assessment  ? ? ? ? ?

## 2022-01-31 NOTE — Interval H&P Note (Signed)
History and Physical Interval Note: ? ?01/31/2022 ?9:14 AM ? ?Shannon Liu  has presented today for surgery, with the diagnosis of RIGHT BREAST CANCER.  The various methods of treatment have been discussed with the patient and family. After consideration of risks, benefits and other options for treatment, the patient has consented to  Procedure(s): ?RIGHT BREAST LUMPECTOMY WITH RADIOACTIVE SEED LOCALIZATION (Right) as a surgical intervention.  The patient's history has been reviewed, patient examined, no change in status, stable for surgery.  I have reviewed the patient's chart and labs.  Questions were answered to the patient's satisfaction.   ? ? ?[name] A Sharaya Boruff ? ? ?

## 2022-02-01 ENCOUNTER — Encounter (HOSPITAL_COMMUNITY): Payer: Self-pay | Admitting: Surgery

## 2022-02-01 DIAGNOSIS — I10 Essential (primary) hypertension: Secondary | ICD-10-CM | POA: Diagnosis not present

## 2022-02-01 DIAGNOSIS — E7800 Pure hypercholesterolemia, unspecified: Secondary | ICD-10-CM | POA: Diagnosis not present

## 2022-02-01 DIAGNOSIS — I480 Paroxysmal atrial fibrillation: Secondary | ICD-10-CM | POA: Diagnosis not present

## 2022-02-05 LAB — SURGICAL PATHOLOGY

## 2022-02-06 ENCOUNTER — Encounter: Payer: Self-pay | Admitting: *Deleted

## 2022-02-06 ENCOUNTER — Encounter: Payer: Self-pay | Admitting: Surgery

## 2022-02-12 DIAGNOSIS — X32XXXD Exposure to sunlight, subsequent encounter: Secondary | ICD-10-CM | POA: Diagnosis not present

## 2022-02-12 DIAGNOSIS — Z85828 Personal history of other malignant neoplasm of skin: Secondary | ICD-10-CM | POA: Diagnosis not present

## 2022-02-12 DIAGNOSIS — L570 Actinic keratosis: Secondary | ICD-10-CM | POA: Diagnosis not present

## 2022-02-12 DIAGNOSIS — Z08 Encounter for follow-up examination after completed treatment for malignant neoplasm: Secondary | ICD-10-CM | POA: Diagnosis not present

## 2022-02-13 ENCOUNTER — Other Ambulatory Visit: Payer: Self-pay

## 2022-02-13 ENCOUNTER — Encounter: Payer: Self-pay | Admitting: Hematology

## 2022-02-13 ENCOUNTER — Inpatient Hospital Stay: Payer: Medicare PPO | Attending: Hematology | Admitting: Hematology

## 2022-02-13 VITALS — BP 181/77 | HR 67 | Temp 98.5°F | Resp 18 | Ht 67.0 in | Wt 214.9 lb

## 2022-02-13 DIAGNOSIS — I480 Paroxysmal atrial fibrillation: Secondary | ICD-10-CM | POA: Diagnosis not present

## 2022-02-13 DIAGNOSIS — Z79899 Other long term (current) drug therapy: Secondary | ICD-10-CM | POA: Diagnosis not present

## 2022-02-13 DIAGNOSIS — M8580 Other specified disorders of bone density and structure, unspecified site: Secondary | ICD-10-CM | POA: Diagnosis not present

## 2022-02-13 DIAGNOSIS — C50411 Malignant neoplasm of upper-outer quadrant of right female breast: Secondary | ICD-10-CM | POA: Diagnosis not present

## 2022-02-13 DIAGNOSIS — I10 Essential (primary) hypertension: Secondary | ICD-10-CM | POA: Diagnosis not present

## 2022-02-13 DIAGNOSIS — Z170 Estrogen receptor positive status [ER+]: Secondary | ICD-10-CM | POA: Diagnosis not present

## 2022-02-13 MED ORDER — TAMOXIFEN CITRATE 20 MG PO TABS: 20.0000 mg | ORAL_TABLET | Freq: Every day | ORAL | 3 refills | Status: DC

## 2022-02-13 NOTE — Progress Notes (Signed)
?[place]   ?[phone] [phone]   ?Clinic Follow up Note  ? ?Patient Care Team: ?Lavone Orn, MD as PCP - General (Internal Medicine) ?Jerline Pain, MD as PCP - Cardiology (Cardiology) ?Erroll Luna, MD as Consulting Physician (General Surgery) ?Truitt Merle, MD as Consulting Physician (Hematology) ?Kyung Rudd, MD as Consulting Physician (Radiation Oncology) ?Rockwell Germany, RN as Oncology Nurse Navigator ?Mauro Kaufmann, RN as Oncology Nurse Navigator ? ?Date of Service:  02/13/2022 ? ?CHIEF COMPLAINT: f/u of right breast cancer ? ?CURRENT THERAPY:  ?To start tamoxifen ? ?ASSESSMENT & PLAN:  ?Shannon Liu is a 81 y.o. post-hysterectomy female with  ? ?1. Malignant neoplasm of upper-outer quadrant of right breast, invasive ductal carcinoma with lobular features, stage IA, pT1c, cN0, ER+/PR+/HER2-, Grade 2  ?-found on screening mammogram. S/p right lumpectomy on 01/31/22 with Dr. Brantley Stage, path showed 1.5 cm IDC with lobular features and DCIS, negative margins. ?-given her early stage disease and advanced age, we opted to forego adjuvant radiation. I will double check with Dr. Lisbeth Renshaw  ?-Giving the strong ER and PR expression in her postmenopausal status, I recommend adjuvant endocrine therapy for a total of 5 years to reduce the risk of cancer recurrence. In her case, I recommend tamoxifen due to her osteopenia and history of hysterectomy. Potential benefits and side effects of tamoxifen were discussed with patient and she is interested. I advised her to start in a few weeks. ?-We also discussed the breast cancer surveillance after her surgery. She will continue annual screening mammogram, self exam, and a routine office visit with lab and exam with [place]. ?-I encouraged her to have healthy diet and exercise regularly.  ?  ?  ?PLAN:  ?-start tamoxifen in a few weeks, I called in today  ?-survivorship in 3 months ?-lab and f/u in 6 months ? ? ?No problem-specific Assessment  & Plan notes found for this encounter. ? ? ?SUMMARY OF ONCOLOGIC HISTORY: ?Oncology History Overview Note  ? Cancer Staging  ?Malignant neoplasm of upper-outer quadrant of right breast in female, estrogen receptor positive ([place]) ?Staging form: Breast, AJCC 8th Edition ?- Clinical stage from 12/06/2021: Stage IA (cT1b, cN0, cM0, G2, ER+, PR+, HER2-) - Signed by Truitt Merle, MD on 12/11/2021 ?- Pathologic stage from 01/31/2022: Stage Unknown (pT1c, pNX, cM0, G2, ER+, PR+, HER2-) - Signed by Truitt Merle, MD on 02/12/2022 ? ? ?  ?Malignant neoplasm of upper-outer quadrant of right breast in female, estrogen receptor positive ([place])  ?11/28/2021 Mammogram  ? CLINICAL DATA:  Patient returns today to evaluate possible RIGHT breast asymmetries identified on recent screening mammogram. ?  ?EXAM: ?DIGITAL DIAGNOSTIC UNILATERAL RIGHT MAMMOGRAM WITH TOMOSYNTHESIS AND CAD; ULTRASOUND RIGHT BREAST LIMITED ? ?IMPRESSION: ?1. Irregular hypoechoic mass in the RIGHT breast at the 10 o'clock ?axis, 7 cm from the nipple, measuring 9 mm, with associated ?architectural distortion, corresponding to the mammographic finding. This is a highly suspicious finding for which ultrasound-guided core biopsy is recommended. ?2. Additional benign findings within the RIGHT breast, as detailed ?above. ?3. No enlarged or morphologically abnormal lymph nodes in the RIGHT axilla. ?  ?12/06/2021 Cancer Staging  ? Staging form: Breast, AJCC 8th Edition ?- Clinical stage from 12/06/2021: Stage IA (cT1b, cN0, cM0, G2, ER+, PR+, HER2-) - Signed by Truitt Merle, MD on 12/11/2021 ?Stage prefix: Initial diagnosis ?Histologic grading system: 3 grade system ? ?  ?12/06/2021 Initial Biopsy  ? Diagnosis ?Breast, right, needle core biopsy, 10 o'clock, 7cmfn ?INVASIVE DUCTAL CARCINOMA  WITH LOBULAR FEATURES, GRADE 2 (3+2+1) ? ?An E-cadherin immunohistochemical stains performed with adequate control. This stain is diffusely negative within the tumor. Histologically the tumor forms ducts  and is present in cribriform sheets as well as focally present in cords and small solid nests. Given this histology it is felt the negative E-cadherin stain represents aberrant absence of expression within an otherwise ductal carcinoma. This lack of E-cadherin expression may explain the focal lobular features. ? ?PROGNOSTIC INDICATORS ?Results: ?The tumor cells are EQUIVOCAL for Her2 (2+). Her2 by FISH will be performed and the results reported separately. ?Estrogen Receptor: 95%, POSITIVE, STRONG STAINING INTENSITY ?Progesterone Receptor: 80%, POSITIVE, STRONG STAINING INTENSITY ?Proliferation Marker Ki67: 2% ?  ?12/10/2021 Initial Diagnosis  ? Malignant neoplasm of upper-outer quadrant of right breast in female, estrogen receptor positive ([place]) ? ?  ? Genetic Testing  ? [facility] CancerNext-Expanded Panel was Negative. Of note, a variant of uncertain significance was detected in the BRCA2 gene (p.[id number]). Report date is 12/24/2021. ? ?The CancerNext-Expanded gene panel offered by [facility] and includes sequencing, rearrangement, and RNA analysis for the following 77 genes: AIP, ALK, APC, ATM, AXIN2, BAP1, BARD1, BLM, BMPR1A, BRCA1, BRCA2, BRIP1, CDC73, CDH1, CDK4, CDKN1B, CDKN2A, CHEK2, CTNNA1, DICER1, FANCC, FH, FLCN, GALNT12, KIF1B, LZTR1, MAX, MEN1, MET, MLH1, MSH2, MSH3, MSH6, MUTYH, NBN, NF1, NF2, NTHL1, PALB2, PHOX2B, PMS2, POT1, PRKAR1A, PTCH1, PTEN, RAD51C, RAD51D, RB1, RECQL, RET, SDHA, SDHAF2, SDHB, SDHC, SDHD, SMAD4, SMARCA4, SMARCB1, SMARCE1, STK11, SUFU, TMEM127, TP53, TSC1, TSC2, VHL and XRCC2 (sequencing and deletion/duplication); EGFR, EGLN1, HOXB13, KIT, MITF, PDGFRA, POLD1, and POLE (sequencing only); EPCAM and GREM1 (deletion/duplication only).  ?  ?01/31/2022 Cancer Staging  ? Staging form: Breast, AJCC 8th Edition ?- Pathologic stage from 01/31/2022: Stage Unknown (pT1c, pNX, cM0, G2, ER+, PR+, HER2-) - Signed by Truitt Merle, MD on 02/12/2022 ?Stage prefix: Initial diagnosis ?Histologic grading  system: 3 grade system ?Residual tumor (R): R0 - None ? ?  ?01/31/2022 Definitive Surgery  ? FINAL MICROSCOPIC DIAGNOSIS:  ? ?A. BREAST, RIGHT, LUMPECTOMY:  ?-  Invasive ductal carcinoma with lobular features, [place] grade 2 of 3, 1.5 cm  ?-  Ductal carcinoma in-situ, intermediate grade  ?-  Margins uninvolved by carcinoma (<0.1 cm; anterior margin)  ?-  Previous biopsy site changes present  ?-  Adenosis and fibrocystic changes  ?-  See oncology table and comment below  ? ?B. BREAST, RIGHT ADDITIONAL ANTERIOMEDIAL MARGIN, EXCISION:  ?-  Usual ductal hyperplasia  ?-  No residual carcinoma identified  ?-  See comment  ? ?C. BREAST, RIGHT ADDITIONAL ANTERIOPOSTERIOR MARGIN, EXCISION:  ?-  No residual carcinoma identified  ? ?D. BREAST, RIGHT ADDITIONAL LATEROINFERIOR MARGIN, EXCISION:  ?-  Focal residual ductal carcinoma in situ  ?-  Fibrocystic changes with metaplasia  ?-  Margins uninvolved by carcinoma (0.1 cm; inferior margin)  ?-  See comment  ?  ? ? ? ?INTERVAL HISTORY:  ?CALYSSA ZOBRIST is here for a follow up of breast cancer. She was last seen by me on 12/12/21. She presents to the clinic accompanied by her daughter. ?She reports she did very well with surgery. No significant pain or other complains.  ?  ?All other systems were reviewed with the patient and are negative. ? ?MEDICAL HISTORY:  ?Past Medical History:  ?Diagnosis Date  ? Anxiety   ? Arthritis   ? Cancer [facility]) 2023  ? right breast  ? Dysrhythmia   ? A fib  ? HOH (hard of hearing)   ?  HTN (hypertension)   ? since pregnant at age 58  ? Hyperlipemia   ? Hypertension   ? PAF (paroxysmal atrial fibrillation) ([place])   ? a. dx on 06/2015 admission. spontaneously converted into NSR. placed on Eliquis  ? Sleep apnea   ? CPAP  ? ? ?SURGICAL HISTORY: ?Past Surgical History:  ?Procedure Laterality Date  ? ABDOMINAL HYSTERECTOMY    ? BREAST CYST EXCISION Left   ? BREAST LUMPECTOMY WITH RADIOACTIVE SEED LOCALIZATION Right 01/31/2022  ? Procedure: RIGHT BREAST  LUMPECTOMY WITH RADIOACTIVE SEED LOCALIZATION;  Surgeon: Erroll Luna, MD;  Location: [facility];  Service: General;  Laterality: Right;  ? BREAST SURGERY Left   ? lumpectomy 35 yrs ago  ? CARDIOVERSION N/A 03/20

## 2022-02-14 ENCOUNTER — Encounter (HOSPITAL_COMMUNITY): Payer: Self-pay

## 2022-02-14 ENCOUNTER — Encounter: Payer: Self-pay | Admitting: *Deleted

## 2022-02-14 DIAGNOSIS — C50411 Malignant neoplasm of upper-outer quadrant of right female breast: Secondary | ICD-10-CM

## 2022-02-25 ENCOUNTER — Other Ambulatory Visit: Payer: Self-pay | Admitting: *Deleted

## 2022-02-25 DIAGNOSIS — I4819 Other persistent atrial fibrillation: Secondary | ICD-10-CM

## 2022-02-25 DIAGNOSIS — I10 Essential (primary) hypertension: Secondary | ICD-10-CM

## 2022-02-25 MED ORDER — TELMISARTAN 80 MG PO TABS: 80.0000 mg | ORAL_TABLET | Freq: Every day | ORAL | 3 refills | Status: DC

## 2022-02-27 DIAGNOSIS — M25562 Pain in left knee: Secondary | ICD-10-CM | POA: Diagnosis not present

## 2022-03-02 ENCOUNTER — Other Ambulatory Visit: Payer: Self-pay | Admitting: Cardiology

## 2022-03-02 DIAGNOSIS — M25562 Pain in left knee: Secondary | ICD-10-CM | POA: Diagnosis not present

## 2022-03-04 ENCOUNTER — Ambulatory Visit: Payer: Medicare PPO | Admitting: Physician Assistant

## 2022-03-04 NOTE — Telephone Encounter (Signed)
Prescription refill request for Eliquis received. Indication:afib Last office visit:01/1722 Scr:0.83 Age: 81 Weight:97.5

## 2022-03-05 ENCOUNTER — Other Ambulatory Visit: Payer: Medicare PPO | Admitting: *Deleted

## 2022-03-05 DIAGNOSIS — I10 Essential (primary) hypertension: Secondary | ICD-10-CM | POA: Diagnosis not present

## 2022-03-05 DIAGNOSIS — I4819 Other persistent atrial fibrillation: Secondary | ICD-10-CM

## 2022-03-06 DIAGNOSIS — M1712 Unilateral primary osteoarthritis, left knee: Secondary | ICD-10-CM | POA: Diagnosis not present

## 2022-03-06 LAB — BASIC METABOLIC PANEL
BUN/Creatinine Ratio: 23 (ref 12–28)
BUN: 17 mg/dL (ref 8–27)
CO2: 26 mmol/L (ref 20–29)
Calcium: 9.5 mg/dL (ref 8.7–10.3)
Chloride: 104 mmol/L (ref 96–106)
Creatinine, Ser: 0.73 mg/dL (ref 0.57–1.00)
Glucose: 116 mg/dL — ABNORMAL HIGH (ref 70–99)
Potassium: 3.9 mmol/L (ref 3.5–5.2)
Sodium: 145 mmol/L — ABNORMAL HIGH (ref 134–144)
eGFR: 83 mL/min/{1.73_m2} (ref >59)

## 2022-03-18 NOTE — Progress Notes (Unsigned)
Cardiology Office Note:    Date:  03/20/2022   ID:  Shannon Liu, DOB 1941-04-16, MRN [id number]  PCP:  Lavone Orn, MD   [facility] Providers Cardiologist:  Candee Furbish, MD     Referring MD: Lavone Orn, MD   Chief Complaint: follow-up atrial fibrillation  History of Present Illness:    Shannon Liu is a very pleasant 81 y.o. female with a hx of PAF, hypertension, hyperlipidemia, OSA on CPAP, and breast cancer.   Was seen in [facility] in the past. Has maintained anticoagulation on Eliquis. She presented to ED 12/26/21 with hypertension and a fib. She was at [facility] on 3/15 for a scheduled lumpectomy for a malignant neoplasm in right breast and was found to be in a fib with RVR in preop. She was given esmolol and metoprolol and advised to present to [facility] ED for cardiology evaluation. She had stopped Eliquis 12/23/21 in preparation for surgery. Echo 12/27/21 showed LVEF 40-45% and global hypokinesis, mildly dilated LV, mild LVH, mildly reduced RVF, mildly enlarged RV, mildly elevated PASP, mild LAE, moderate RAE, small pericardial effusion, mild MR, and mild-moderate TR. On 12/31/21 she underwent TEE/cardioversion with restoration to NSR and patient was discharged 12/31/21.   She sent a message to our office 01/04/22 with concerns for more frequent heart racing episodes and almost constant palpitations, also feeling fatigued. HR was in 160s bpm.  Following cardioversion, she was feeling poorly with worsening shortness of breath, weight gain, and bloating. She was advised to take 20 mg furosemide. HR was in the 40s and was feeling weak and fatigued so office visit was scheduled.   Seen in our office on 01/09/2022 by Dr. Marlou Porch at which time heart rate was slow and she was feeling symptoms of a fib. Diltiazem 30 mg was helping relieve s/s of a fib. Dr. Marlou Porch reduced her Toprol from 50 mg to 25 mg due to bradycardia and fatigue. She was continued on Cardizem CD1 120 mg a day. Zio  cardiac monitor was ordered for 2 weeks. Her weight gain had improved with addition of Lasix. She was advised to follow-up in 2 weeks.  Breast lumpectomy rescheduled for 01/31/22 with plan to hold Eliquis for 2 days preop due to continuation of AC for one month post DCCV.   She was seen by me on 01/28/2022 for preoperative evaluation for upcoming breast surgery. Feeling better, more energy and has noticed that heart rate is no longer in the 40s (now 50s-low 60s) on the lower dose of metoprolol.  Continues to have palpitations and "flutters" as well as fatigue. Home BP has been elevated 140s over 70s.  PCP planned for her to work with pharmacist for better blood pressure control, however she was dealing with the new diagnosis of breast cancer and did not complete the appointment. Uses CPAP every night.  Was previously walking a mile 3 times a week, but has not done so lately.  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 Cardiac monitor revealed sinus rhythm with average heart rate 52 bpm, brief episodes of atrial tachycardia, PACs 9%, occasional PVCs 4%, no evidence of atrial fibrillation.  She was advised to continue with diltiazem and metoprolol.  She was advised to resume telmisartan for elevated BP.  Today, she is here alone for follow-up.  She reports her breast surgery went well and she will be taking oral chemotherapy, no other chemotherapy or  radiation needed.  Home BP readings are stable with a Liu elevated readings > 409 systolic.  Reports resting HR is 40-50. It increases to the 60s when she is active, however she wonders if it is making her feel more fatigued. She denies lightheadedness, presyncope, syncope. chest pain, shortness of breath, lower extremity edema, fatigue, palpitations, melena, hematuria, hemoptysis, diaphoresis, weakness, presyncope, syncope, orthopnea, and PND.    Past Medical History:  Diagnosis Date   Anxiety     Arthritis    Cancer ([place]) 2023   right breast   Dysrhythmia    A fib   HOH (hard of hearing)    HTN (hypertension)    since pregnant at age 9   Hyperlipemia    Hypertension    PAF (paroxysmal atrial fibrillation) ([place])    a. dx on 06/2015 admission. spontaneously converted into NSR. placed on Eliquis   Sleep apnea    CPAP    Past Surgical History:  Procedure Laterality Date   ABDOMINAL HYSTERECTOMY     BREAST CYST EXCISION Left    BREAST LUMPECTOMY WITH RADIOACTIVE SEED LOCALIZATION Right 01/31/2022   Procedure: RIGHT BREAST LUMPECTOMY WITH RADIOACTIVE SEED LOCALIZATION;  Surgeon: Erroll Luna, MD;  Location: [place];  Service: General;  Laterality: Right;   BREAST SURGERY Left    lumpectomy 35 yrs ago   CARDIOVERSION N/A 12/31/2021   Procedure: CARDIOVERSION;  Surgeon: Donato Heinz, MD;  Location: [facility] ENDOSCOPY;  Service: Cardiovascular;  Laterality: N/A;   DILATION AND CURETTAGE OF UTERUS     NECK SURGERY     discectomy with cadaver bone   TEE WITHOUT CARDIOVERSION N/A 12/31/2021   Procedure: TRANSESOPHAGEAL ECHOCARDIOGRAM (TEE);  Surgeon: Donato Heinz, MD;  Location: [facility] ENDOSCOPY;  Service: Cardiovascular;  Laterality: N/A;   TONSILLECTOMY     removed as a child   TUBAL LIGATION      Current Medications: Current Meds  Medication Sig   acetaminophen (TYLENOL) 500 MG tablet Take 500 mg by mouth every 6 (six) hours as needed for mild pain.   ALPRAZolam (XANAX) 0.25 MG tablet Take 0.25 mg by mouth at bedtime as needed for anxiety.   Cholecalciferol (VITAMIN D) 50 MCG (2000 UT) CAPS Take 2,000 Units by mouth 2 (two) times daily.   diltiazem (CARDIZEM) 30 MG tablet Take 1 tablet every 4 hours AS NEEDED for AFIB heart rate >100   ELIQUIS 5 MG TABS tablet TAKE ONE TABLET BY MOUTH TWICE DAILY   furosemide (LASIX) 20 MG tablet Take 1 tablet (20 mg total) by mouth daily.   Lactobacillus (PROBIOTIC ACIDOPHILUS PO) Take 1 tablet by mouth daily.   metoprolol  succinate (TOPROL-XL) 25 MG 24 hr tablet Take 1 tablet (25 mg total) by mouth daily. Take with or immediately following a meal.   potassium chloride (KLOR-CON) 10 MEQ tablet Take 1 tablet (10 mEq total) by mouth 2 (two) times daily.   Propylene Glycol 0.6 % SOLN Place 1 drop into both eyes 2 (two) times daily. systane eye drops   rosuvastatin (CRESTOR) 10 MG tablet Take 10 mg by mouth daily.   sacubitril-valsartan (ENTRESTO) 49-51 MG Take 1 tablet by mouth 2 (two) times daily.   tamoxifen (NOLVADEX) 20 MG tablet Take 1 tablet (20 mg total) by mouth daily.   [DISCONTINUED] diltiazem (CARDIZEM CD) 120 MG 24 hr capsule Take 1 capsule (120 mg total) by mouth 2 (two) times daily. Please make yearly appt with Dr. Marlou Porch for May 2023 for future refills. Thank you  1st attempt   [DISCONTINUED] Multiple Vitamins-Minerals (MULTIVITAMIN WITH MINERALS) tablet Take 1 tablet by mouth daily.   [DISCONTINUED] oxyCODONE (OXY IR/ROXICODONE) 5 MG immediate release tablet Take 1 tablet (5 mg total) by mouth every 6 (six) hours as needed for severe pain.   [DISCONTINUED] telmisartan (MICARDIS) 80 MG tablet Take 1 tablet (80 mg total) by mouth daily.     Allergies:   Amlodipine besylate, Buspirone, Fluoxetine, Morphine and related, Penicillins, and Sertraline hcl   Social History   Socioeconomic History   Marital status: Widowed    Spouse name: Not on file   Number of children: 2   Years of education: Not on file   Highest education level: Not on file  Occupational History   Occupation: Retired  Tobacco Use   Smoking status: Never   Smokeless tobacco: Never  Vaping Use   Vaping Use: Never used  Substance and Sexual Activity   Alcohol use: No    Alcohol/week: 0.0 standard drinks   Drug use: No   Sexual activity: Not on file  Other Topics Concern   Not on file  [place], near [place]. Daughter currently lives with her.   Social Determinants of Health    Financial Resource Strain: Not on file  Food Insecurity: Not on file  Transportation Needs: Not on file  Physical Activity: Not on file  Stress: Not on file  Social Connections: Not on file     Family History: The patient's family history includes CVA (age of onset: 27) in her mother; Colon cancer in her maternal grandfather; Heart attack (age of onset: 65) in her mother; Hypertension in her brother and sister; Prostate cancer (age of onset: 4) in her son; Unexplained death (age of onset: 104) in her father. There is no history of Breast cancer.  ROS:   Please see the history of present illness.  All other systems reviewed and are negative.  Labs/Other Studies Reviewed:    The following studies were reviewed today:  Cardiac monitor 01/30/22  Sinus rhythm average heart rate 52 bpm. Brief episodes of atrial tachycardia noted. PACs-9%, frequent Occasional PVCs-4% No evidence of atrial fibrillation noted. Overall reassuring. Continue with diltiazem and metoprolol.  TTE Complete 12/27/21  Left Ventricle: Left ventricular ejection fraction, by estimation, is 40  to 45%. The left ventricle has mildly decreased function. The left  ventricle demonstrates global hypokinesis. The left ventricular internal  cavity size was mildly dilated. There is   mild left ventricular hypertrophy. Left ventricular diastolic function  could not be evaluated due to atrial fibrillation. Left ventricular  diastolic function could not be evaluated.  Right Ventricle: The right ventricular size is mildly enlarged. Right  vetricular wall thickness was not well visualized. Right ventricular  systolic function is mildly reduced. There is mildly elevated pulmonary  artery systolic pressure. The tricuspid  regurgitant velocity is 2.30 m/s, and with an assumed right atrial  pressure of 15 mmHg, the estimated right ventricular systolic pressure is  83.4 mmHg.  Left Atrium: Left atrial size was mildly dilated.   Right Atrium: Right atrial size was moderately dilated.  Pericardium: A small pericardial effusion is present.  Mitral Valve: The mitral valve is normal in structure. Mild mitral valve  regurgitation.  Tricuspid Valve: The tricuspid valve is normal in structure. Tricuspid  valve regurgitation is mild to moderate.  Aortic Valve: The aortic valve is normal in structure. Aortic valve  regurgitation is not visualized. No aortic  stenosis is present. Aortic  valve peak gradient measures 4.8 mmHg.  Pulmonic Valve: The pulmonic valve was not well visualized. Pulmonic valve  regurgitation is not visualized.  Aorta: The aortic root and ascending aorta are structurally normal, with  no evidence of dilitation.  Venous: The inferior vena cava is dilated in size with less than 50%  respiratory variability, suggesting right atrial pressure of 15 mmHg.  IAS/Shunts: The atrial septum is grossly normal.   Echo TEE 12/31/21   1. Left ventricular ejection fraction, by estimation, is 40 to 45%. The  left ventricle has mildly decreased function.   2. Right ventricular systolic function is mildly reduced. The right  ventricular size is normal.   3. Left atrial size was mildly dilated. No left atrial/left atrial  appendage thrombus was detected.   4. Right atrial size was mildly dilated.   5. A small pericardial effusion is present.   6. The mitral valve is normal in structure. Mild mitral valve  regurgitation.   7. Tricuspid valve regurgitation is mild to moderate.   8. The aortic valve is tricuspid. Aortic valve regurgitation is not  visualized.   9. Aortic dilatation noted. There is mild dilatation of the ascending  aorta, measuring 38 mm.   Conclusion(s)/Recommendation(s): No LA/LAA thrombus identified. Successful  cardioversion performed with restoration of normal sinus rhythm.    Lexiscan myoview 06/2015   Nuclear stress EF: 70%. There was no ST segment deviation noted during stress. The  study is normal. No ischemia. No evidence of infarction This is a low risk study.    Recent Labs: 12/12/2021: ALT 14 12/26/2021: TSH 0.819 12/28/2021: Magnesium 2.1 01/30/2022: Hemoglobin 14.2; Platelets 231 03/05/2022: BUN 17; Creatinine, Ser 0.73; Potassium 3.9; Sodium 145  Recent Lipid Panel    Component Value Date/Time   CHOL 142 06/24/2015 0329   TRIG 118 06/24/2015 0329   HDL 36 (L) 06/24/2015 0329   CHOLHDL 3.9 06/24/2015 0329   VLDL 24 06/24/2015 0329   LDLCALC 82 06/24/2015 0329     Risk Assessment/Calculations:     CHA2DS2-VASc Score = 5  {This indicates a 7.2% annual risk of stroke. The patient's score is based upon: CHF History: 1 HTN History: 1 Diabetes History: 0 Stroke History: 0 Vascular Disease History: 0 Age Score: 2 Gender Score: 1    Physical Exam:    VS:  BP (!) 168/90   Pulse 68   Ht '5\' 7"'$  (1.702 m)   Wt 210 lb 3.2 oz (95.3 kg)   BMI 32.92 kg/m     Wt Readings from Last 3 Encounters:  03/20/22 210 lb 3.2 oz (95.3 kg)  02/13/22 214 lb 14.4 oz (97.5 kg)  01/31/22 210 lb (95.3 kg)     GEN:  Well nourished, well developed in no acute distress HEENT: Normal NECK: No JVD; No carotid bruits CARDIAC: Irregular RR, no murmurs, rubs, gallops RESPIRATORY:  Clear to auscultation without rales, wheezing or rhonchi  ABDOMEN: Soft, non-tender, non-distended MUSCULOSKELETAL:  No edema. No deformity. 2+ pedal pulses, equal bilaterally SKIN: Warm and dry NEUROLOGIC:  Alert and oriented x 3 PSYCHIATRIC:  Normal affect   EKG:  EKG is not ordered today  Diagnoses:    1. Essential hypertension   2. Paroxysmal atrial fibrillation (HCC)   3. Chronic HFrEF (heart failure with reduced ejection fraction) ([place])   4. OSA on CPAP   5. Malignant neoplasm of upper-outer quadrant of right breast in female, estrogen receptor positive ([place])     Assessment  and Plan:     Breast cancer: Right breast lumpectomy 01/31/2022.  She is doing well.  Will take oral  chemotherapy only.   Paroxysmal atrial fibrillation on chronic anticoagulation: TEE/DCCV 12/31/21. One episode of HR up to 130 bpm 2 months ago, took diltiazem 30 mg and 2 hours later HR was normal. No other evidence of a fib.  She notes palpitations, occasional DOE when working outside for long periods. Notes some fatigue during the daytime.  Recent cardiac monitor revealed SR with average HR 52 bpm, brief episodes of atrial tachycardia noted, frequent PACs, occasional PVCs, no evidence of atrial fibrillation. For management of HFrEF, we will discontinue diltiazem and consider increasing metoprolol if palpitations persist. Will refer her to EP for consideration of ablation. Eliquis dose is adequate for age/weight/creatinine. Continue Eliquis, metoprolol. Continue low dose diltiazem as needed.   Essential hypertension: BP is elevated today and remains elevated by my check. Reports it is always higher at provider's office. Started telmisartan 80 mg on 5/12. BP improved slightly since that time. As noted below, we will stop daily diltiazem and telmisartan and start Entresto. Will recheck bmet in 2 weeks.   Chronic HFrEF: LVEF 40-45%, global hypokinesis, mild LVH, unable to evaluate diastolic dysfunction by echo 12/27/21. Occasional fatigue. No dyspnea, edema, orthopnea, PND. Weight is stable.  BP remains slightly elevated on telmisartan.  We will discontinue telmisartan and start Entresto 49-51 twice daily.  We will have her stop diltiazem and monitor BP and HR.  If palpitations worsen, will increase Toprol to 50 mg at bedtime.   OSA on CPAP:  She is compliant with her CPAP. Sleeping well.  Takes her a little while to adjust when she takes off her mask each morning.  Otherwise no concerns.  Disposition: 3 months with Dr. Marlou Porch or APP   Medication Adjustments/Labs and Tests Ordered: Current medicines are reviewed at length with the patient today.  Concerns regarding medicines are outlined above.  Orders  Placed This Encounter  Procedures   Basic metabolic panel   Ambulatory referral to Cardiac Electrophysiology   Meds ordered this encounter  Medications   sacubitril-valsartan (ENTRESTO) 49-51 MG    Sig: Take 1 tablet by mouth 2 (two) times daily.    Dispense:  180 tablet    Refill:  1    Please Honor Card patient is presenting for Carmie Kanner: [id number]; Juanna Cao: [id number]; WNUUV: OHS; OZDG: [phone]    Patient Instructions  Medication Instructions:  Your physician has recommended you make the following change in your medication:   STOP Diltiazem 120  (continue the 30 mg taking as needed)  STOP Telmisartan  START Entresto 49-51 taking 1 tablet bid   *If you need a refill on your cardiac medications before your next appointment, please call your pharmacy*   Lab Work: 2 WEEKS:  04-04-22 BMET , come anytime after 7:15 a.m.  If you have labs (blood work) drawn today and your tests are completely normal, you will receive your results only by: [place] (if you have MyChart) OR A paper copy in the mail If you have any lab test that is abnormal or we need to change your treatment, we will call you to review the results.   Testing/Procedures: None ordered  You have been referred to Dr. Curt Bears or Dr. Quentin Ore to discuss Ablataion   Follow-Up: At [facility] you and your health needs are our priority.  As part of our continuing mission to provide you with exceptional heart care, we  have created designated Provider Care Teams.  These Care Teams include your primary Cardiologist (physician) and Advanced Practice Providers (APPs -  Physician Assistants and Nurse Practitioners) who all work together to provide you with the care you need, when you need it.  We recommend signing up for the patient portal called "MyChart".  Sign up information is provided on this After Visit Summary.  MyChart is used to connect with patients for Virtual Visits (Telemedicine).  Patients are able  to view lab/test results, encounter notes, upcoming appointments, etc.  Non-urgent messages can be sent to your provider as well.   To learn more about what you can do with MyChart, go to [email or web address].    Your next appointment:   3 month(s)  The format for your next appointment:   In Person  Provider:   Candee Furbish, MD  or Christen Bame, NP         Other Instructions   Important Information About Sugar         Signed, Emmaline Life, NP  03/20/2022 2:33 PM    [place]

## 2022-03-20 ENCOUNTER — Encounter: Payer: Self-pay | Admitting: Nurse Practitioner

## 2022-03-20 ENCOUNTER — Ambulatory Visit: Payer: Medicare PPO | Admitting: Nurse Practitioner

## 2022-03-20 ENCOUNTER — Other Ambulatory Visit: Payer: Self-pay | Admitting: Cardiology

## 2022-03-20 VITALS — BP 168/90 | HR 68 | Ht 67.0 in | Wt 210.2 lb

## 2022-03-20 DIAGNOSIS — I5022 Chronic systolic (congestive) heart failure: Secondary | ICD-10-CM | POA: Diagnosis not present

## 2022-03-20 DIAGNOSIS — C50411 Malignant neoplasm of upper-outer quadrant of right female breast: Secondary | ICD-10-CM

## 2022-03-20 DIAGNOSIS — G4733 Obstructive sleep apnea (adult) (pediatric): Secondary | ICD-10-CM

## 2022-03-20 DIAGNOSIS — Z170 Estrogen receptor positive status [ER+]: Secondary | ICD-10-CM

## 2022-03-20 DIAGNOSIS — I480 Paroxysmal atrial fibrillation: Secondary | ICD-10-CM

## 2022-03-20 DIAGNOSIS — Z9989 Dependence on other enabling machines and devices: Secondary | ICD-10-CM

## 2022-03-20 DIAGNOSIS — I10 Essential (primary) hypertension: Secondary | ICD-10-CM

## 2022-03-20 MED ORDER — DILTIAZEM HCL 30 MG PO TABS: ORAL_TABLET | ORAL | 3 refills | Status: DC

## 2022-03-20 MED ORDER — ENTRESTO 49-51 MG PO TABS: 1.0000 | ORAL_TABLET | Freq: Two times a day (BID) | ORAL | 1 refills | Status: DC

## 2022-03-20 NOTE — Patient Instructions (Addendum)
Medication Instructions:  Your physician has recommended you make the following change in your medication:   STOP Diltiazem 120  (continue the 30 mg taking as needed)  STOP Telmisartan  START Entresto 49-51 taking 1 tablet bid   *If you need a refill on your cardiac medications before your next appointment, please call your pharmacy*   Lab Work: 2 WEEKS:  04-04-22 BMET , come anytime after 7:15 a.m.  If you have labs (blood work) drawn today and your tests are completely normal, you will receive your results only by: [place] (if you have MyChart) OR A paper copy in the mail If you have any lab test that is abnormal or we need to change your treatment, we will call you to review the results.   Testing/Procedures: None ordered  You have been referred to Dr. Curt Bears or Dr. Quentin Ore to discuss Ablataion   Follow-Up: At [facility], you and your health needs are our priority.  As part of our continuing mission to provide you with exceptional heart care, we have created designated Provider Care Teams.  These Care Teams include your primary Cardiologist (physician) and Advanced Practice Providers (APPs -  Physician Assistants and Nurse Practitioners) who all work together to provide you with the care you need, when you need it.  We recommend signing up for the patient portal called "MyChart".  Sign up information is provided on this After Visit Summary.  MyChart is used to connect with patients for Virtual Visits (Telemedicine).  Patients are able to view lab/test results, encounter notes, upcoming appointments, etc.  Non-urgent messages can be sent to your provider as well.   To learn more about what you can do with MyChart, go to [email or web address].    Your next appointment:   3 month(s)  The format for your next appointment:   In Person  Provider:   Candee Furbish, MD  or Christen Bame, NP         Other Instructions   Important Information About Sugar

## 2022-03-25 NOTE — Therapy (Signed)
OUTPATIENT PHYSICAL THERAPY LOWER EXTREMITY EVALUATION   Patient Name: Shannon Liu MRN: [id number] DOB:[birth year], 81 y.o., female Today's Date: 03/26/2022   PT End of Session - 03/26/22 1053     Visit Number 1    Date for PT Re-Evaluation 06/26/22    Authorization Type [employer]    PT Start Time 1053    PT Stop Time 1143    PT Time Calculation (min) 50 min    Activity Tolerance Patient tolerated treatment well    Behavior During Therapy WFL for tasks assessed/performed             Past Medical History:  Diagnosis Date   Anxiety    Arthritis    Cancer ([place]) 2023   right breast   Dysrhythmia    A fib   HOH (hard of hearing)    HTN (hypertension)    since pregnant at age 81   Hyperlipemia    Hypertension    PAF (paroxysmal atrial fibrillation) ([place])    a. dx on 06/2015 admission. spontaneously converted into NSR. placed on Eliquis   Sleep apnea    CPAP   Past Surgical History:  Procedure Laterality Date   ABDOMINAL HYSTERECTOMY     BREAST CYST EXCISION Left    BREAST LUMPECTOMY WITH RADIOACTIVE SEED LOCALIZATION Right 01/31/2022   Procedure: RIGHT BREAST LUMPECTOMY WITH RADIOACTIVE SEED LOCALIZATION;  Surgeon: Erroll Luna, MD;  Location: [facility];  Service: General;  Laterality: Right;   BREAST SURGERY Left    lumpectomy 35 yrs ago   CARDIOVERSION N/A 12/31/2021   Procedure: CARDIOVERSION;  Surgeon: Donato Heinz, MD;  Location: [facility] ENDOSCOPY;  Service: Cardiovascular;  Laterality: N/A;   DILATION AND CURETTAGE OF UTERUS     NECK SURGERY     discectomy with cadaver bone   TEE WITHOUT CARDIOVERSION N/A 12/31/2021   Procedure: TRANSESOPHAGEAL ECHOCARDIOGRAM (TEE);  Surgeon: Donato Heinz, MD;  Location: [facility] ENDOSCOPY;  Service: Cardiovascular;  Laterality: N/A;   TONSILLECTOMY     removed as a child   TUBAL LIGATION     Patient Active Problem List   Diagnosis Date Noted   Chronic diastolic heart failure ([place]) 01/09/2022   Left ventricular  dysfunction 12/31/2021   Breast cancer ([place]) 12/31/2021   Pericardial effusion 12/31/2021   Mitral regurgitation 12/31/2021   Tricuspid regurgitation 12/31/2021   Genetic testing 12/28/2021   Family history of prostate cancer 12/13/2021   Malignant neoplasm of upper-outer quadrant of right breast in female, estrogen receptor positive ([place]) 12/10/2021   Degenerative disc disease, cervical 03/20/2020   Chronic anticoagulation 07/04/2015   HOH (hard of hearing)    Hyperlipemia    PAF (paroxysmal atrial fibrillation) ([place]) 06/23/2015   HTN (hypertension)     PCP: Lavone Orn  REFERRING PROVIDER: Rhona Raider  REFERRING DIAG: left knee pain  THERAPY DIAG:  Chronic pain of left knee  Stiffness of left knee, not elsewhere classified  Difficulty in walking, not elsewhere classified  Rationale for Evaluation and Treatment Rehabilitation  ONSET DATE: 03/07/22  SUBJECTIVE:   SUBJECTIVE STATEMENT: Patient reports that she has DJD, Left knee is the worst.  She reports that MRI revealed no tears, just degenerative.  She reports a long time of some pain, has a Baker's cyst.  Reports that she was struggling with walking and difficulty with stairs.  She reports an injection about 3 weeks ago, and reports that she is feeling much   PERTINENT HISTORY: Anxiety, arthritis, breast CA, HOH, HTN, a-fib  PAIN:  Are you having pain? Yes: NPRS scale: 0/10 Pain location: left knee Pain description: sore, ache Aggravating factors: walking, stairs, at end of day pain up to 8/10 prior to the injection Relieving factors: the injection helped tremendously, tylenol, really helped mostly no pain  PRECAUTIONS: No  WEIGHT BEARING RESTRICTIONS No  FALLS:    Has patient fallen in last 6 months? No and Yes. Number of falls 1 On steps outside no handrails  LIVING ENVIRONMENT: Lives with: lives with their family Lives in: House/apartment Stairs: No Has following equipment at home: None  OCCUPATION:  retired  PLOF: Independent  does housework, yardwork  PATIENT GOALS no pain do some yardwork, get up and down better   OBJECTIVE:   DIAGNOSTIC FINDINGS: x-rays, tight calves and pirformis  PATIENT SURVEYS:  FOTO 55  COGNITION:  Overall cognitive status: Within functional limits for tasks assessed     SENSATION: WFL  POSTURE: No Significant postural limitations  PALPATION: Mild tenderness in the left posterior knee.  Some tenderness in the left ITB, some tenderness in the left buttock and the left low back  LOWER EXTREMITY ROM:  Active ROM Right eval Left eval  Hip flexion    Hip extension    Hip abduction    Hip adduction    Hip internal rotation    Hip external rotation    Knee flexion  0  Knee extension  106  Ankle dorsiflexion    Ankle plantarflexion    Ankle inversion    Ankle eversion     (Blank rows = not tested)  LOWER EXTREMITY MMT:  MMT Right eval Left eval  Hip flexion    Hip extension    Hip abduction    Hip adduction    Hip internal rotation    Hip external rotation    Knee flexion  4  Knee extension  4  Ankle dorsiflexion    Ankle plantarflexion    Ankle inversion    Ankle eversion     (Blank rows = not tested)  FUNCTIONAL TESTS:  5 times sit to stand: 16 Timed up and go (TUG): 12  GAIT:  Comments: most difficulty on stairs, prior to the injection had to do stairs one at a time, no device, minimal to no limp    TODAY'S TREATMENT: Calf stretches   PATIENT EDUCATION:  Education details: calf stretches at home Person educated: Patient Education method: Consulting civil engineer, Demonstration, Tactile cues, Verbal cues, and Handouts Education comprehension: verbalized understanding, returned demonstration, and verbal cues required   HOME EXERCISE PROGRAM: Gastroc stretches  ASSESSMENT:  CLINICAL IMPRESSION: Patient is a 81 y.o. female who was seen today for physical therapy evaluation and treatment for left knee pain.  Patient  reports that over the past 6 months she has had increased left knee pain.  Has some DJD, minimal crepitus, has good ROM she reports a lot of difficulty walking, doing stairs and high rating of pain, she had an injection in the knee about 3 weeks ago and is feeling better with less pain and less difficulty walking.  She has tight calves and piriformis.  .    OBJECTIVE IMPAIRMENTS Abnormal gait, decreased activity tolerance, decreased balance, decreased mobility, difficulty walking, decreased ROM, decreased strength, increased edema, and pain.   ACTIVITY LIMITATIONS lifting, bending, standing, squatting, stairs, and locomotion level  PARTICIPATION LIMITATIONS: laundry, shopping, and yard work  [employer] POTENTIAL: Good  CLINICAL DECISION MAKING: Stable/uncomplicated  EVALUATION COMPLEXITY: Low   GOALS: Goals reviewed with patient? Yes  SHORT TERM GOALS: Target date: 04/09/22  Independent with initial HEP Goal status: met   LONG TERM GOALS: Target date:06/18/22  Decrease pain with activity 50% Goal status: INITIAL  2.  Go up and down stairs step over step without difficulty Goal status: INITIAL  3.  Independent with advanced HEP Goal status: INITIAL  PLAN: PT FREQUENCY: 1x/week  PT DURATION: 12 weeks  PLANNED INTERVENTIONS: Therapeutic exercises, Therapeutic activity, Neuromuscular re-education, Balance training, Gait training, Patient/Family education, Joint mobilization, Dry Needling, Cryotherapy, Traction, and Manual therapy  PLAN FOR NEXT SESSION: Patient will be out of town next week, she is doing great after the injection, I made her one appointment for after she comes back, if still doing well may tweak the HEP    Lorine Iannaccone W, PT 03/26/2022, 11:50 AM

## 2022-03-26 ENCOUNTER — Encounter: Payer: Self-pay | Admitting: Physical Therapy

## 2022-03-26 ENCOUNTER — Ambulatory Visit: Payer: Medicare PPO | Attending: Orthopaedic Surgery | Admitting: Physical Therapy

## 2022-03-26 DIAGNOSIS — M25562 Pain in left knee: Secondary | ICD-10-CM | POA: Insufficient documentation

## 2022-03-26 DIAGNOSIS — R262 Difficulty in walking, not elsewhere classified: Secondary | ICD-10-CM | POA: Insufficient documentation

## 2022-03-26 DIAGNOSIS — M25662 Stiffness of left knee, not elsewhere classified: Secondary | ICD-10-CM | POA: Diagnosis not present

## 2022-03-26 DIAGNOSIS — G8929 Other chronic pain: Secondary | ICD-10-CM | POA: Insufficient documentation

## 2022-03-28 DIAGNOSIS — C50911 Malignant neoplasm of unspecified site of right female breast: Secondary | ICD-10-CM | POA: Diagnosis not present

## 2022-03-28 DIAGNOSIS — G4733 Obstructive sleep apnea (adult) (pediatric): Secondary | ICD-10-CM | POA: Diagnosis not present

## 2022-03-28 DIAGNOSIS — I5020 Unspecified systolic (congestive) heart failure: Secondary | ICD-10-CM | POA: Diagnosis not present

## 2022-03-28 DIAGNOSIS — R7301 Impaired fasting glucose: Secondary | ICD-10-CM | POA: Diagnosis not present

## 2022-03-28 DIAGNOSIS — I480 Paroxysmal atrial fibrillation: Secondary | ICD-10-CM | POA: Diagnosis not present

## 2022-03-28 DIAGNOSIS — Z170 Estrogen receptor positive status [ER+]: Secondary | ICD-10-CM | POA: Diagnosis not present

## 2022-03-28 DIAGNOSIS — E7800 Pure hypercholesterolemia, unspecified: Secondary | ICD-10-CM | POA: Diagnosis not present

## 2022-03-28 DIAGNOSIS — I10 Essential (primary) hypertension: Secondary | ICD-10-CM | POA: Diagnosis not present

## 2022-03-28 DIAGNOSIS — D6869 Other thrombophilia: Secondary | ICD-10-CM | POA: Diagnosis not present

## 2022-04-04 ENCOUNTER — Other Ambulatory Visit: Payer: Medicare PPO

## 2022-04-04 NOTE — Addendum Note (Signed)
Addended by: Sumner Boast on: 04/04/2022 08:14 AM   Modules accepted: Orders

## 2022-04-08 ENCOUNTER — Other Ambulatory Visit: Payer: Medicare PPO

## 2022-04-08 DIAGNOSIS — I10 Essential (primary) hypertension: Secondary | ICD-10-CM | POA: Diagnosis not present

## 2022-04-09 LAB — BASIC METABOLIC PANEL
BUN/Creatinine Ratio: 24 (ref 12–28)
BUN: 20 mg/dL (ref 8–27)
CO2: 24 mmol/L (ref 20–29)
Calcium: 9.4 mg/dL (ref 8.7–10.3)
Chloride: 105 mmol/L (ref 96–106)
Creatinine, Ser: 0.82 mg/dL (ref 0.57–1.00)
Glucose: 99 mg/dL (ref 70–99)
Potassium: 4.7 mmol/L (ref 3.5–5.2)
Sodium: 146 mmol/L — ABNORMAL HIGH (ref 134–144)
eGFR: 72 mL/min/{1.73_m2} (ref >59)

## 2022-04-11 ENCOUNTER — Ambulatory Visit: Payer: Medicare PPO | Admitting: Physical Therapy

## 2022-04-17 DIAGNOSIS — M1712 Unilateral primary osteoarthritis, left knee: Secondary | ICD-10-CM | POA: Diagnosis not present

## 2022-04-18 ENCOUNTER — Telehealth: Payer: Self-pay | Admitting: Internal Medicine

## 2022-04-18 NOTE — Telephone Encounter (Signed)
Patient states her friend Nicholes Stairs recommended Dr. Larose Kells as a PCP since her current provider Dr. Lavone Orn is retiring. Pt stated her friend had already spoken to Dr. Larose Kells and that Dr. Larose Kells had agreed to take her as a new patient. Is this ok?

## 2022-04-18 NOTE — Telephone Encounter (Signed)
Yes, no problem.  Thanks

## 2022-04-22 NOTE — Telephone Encounter (Signed)
APPt made.

## 2022-04-23 ENCOUNTER — Ambulatory Visit: Payer: Medicare PPO | Admitting: Physical Therapy

## 2022-04-26 DIAGNOSIS — F411 Generalized anxiety disorder: Secondary | ICD-10-CM | POA: Insufficient documentation

## 2022-04-26 DIAGNOSIS — G4733 Obstructive sleep apnea (adult) (pediatric): Secondary | ICD-10-CM | POA: Insufficient documentation

## 2022-04-28 ENCOUNTER — Encounter: Payer: Self-pay | Admitting: Hematology

## 2022-05-07 ENCOUNTER — Institutional Professional Consult (permissible substitution): Payer: Medicare PPO | Admitting: Cardiology

## 2022-05-13 ENCOUNTER — Encounter: Payer: Self-pay | Admitting: *Deleted

## 2022-05-13 ENCOUNTER — Encounter: Payer: Self-pay | Admitting: Cardiology

## 2022-05-13 ENCOUNTER — Ambulatory Visit: Payer: Medicare PPO | Admitting: Cardiology

## 2022-05-13 VITALS — BP 122/84 | HR 57 | Ht 67.0 in | Wt 207.2 lb

## 2022-05-13 DIAGNOSIS — D6869 Other thrombophilia: Secondary | ICD-10-CM

## 2022-05-13 DIAGNOSIS — I4819 Other persistent atrial fibrillation: Secondary | ICD-10-CM | POA: Diagnosis not present

## 2022-05-13 NOTE — Progress Notes (Signed)
Electrophysiology Office Note   Date:  05/13/2022   ID:  Shannon, Liu 09/19/1941, MRN [id number]  PCP:  Lavone Orn, MD  Cardiologist:  Marlou Porch Primary Electrophysiologist:  Arneshia Ade Meredith Leeds, MD    Chief Complaint: AF   History of Present Illness: Shannon Liu is a 81 y.o. female who is being seen today for the evaluation of AF at the request of Swinyer, Lanice Schwab, NP. Presenting today for electrophysiology evaluation.  He has a history significant for atrial fibrillation, hypertension, hyperlipidemia, OSA on CPAP, breast cancer.  She has had multiple episodes of atrial fibrillation over the last few months.  She has mild systolic heart failure by her echo with an ejection fraction of 40 to 45%.  Unfortunately she has been having more frequent episodes of atrial fibrillation.  Heart rates have been in the 160s at times.  She had a cardioversion, but continued to have intermittent short episodes.  Today, she denies symptoms of palpitations, chest pain, shortness of breath, orthopnea, PND, lower extremity edema, claudication, dizziness, presyncope, syncope, bleeding, or neurologic sequela. The patient is tolerating medications without difficulties.    Past Medical History:  Diagnosis Date   Anxiety    Arthritis    Cancer ([place]) 2023   right breast   Dysrhythmia    A fib   HOH (hard of hearing)    Hyperlipemia    Hypertension    PAF (paroxysmal atrial fibrillation) ([place])    a. dx on 06/2015 admission. spontaneously converted into NSR. placed on Eliquis   Sleep apnea    CPAP   Past Surgical History:  Procedure Laterality Date   ABDOMINAL HYSTERECTOMY     BREAST CYST EXCISION Left    BREAST LUMPECTOMY WITH RADIOACTIVE SEED LOCALIZATION Right 01/31/2022   Procedure: RIGHT BREAST LUMPECTOMY WITH RADIOACTIVE SEED LOCALIZATION;  Surgeon: Erroll Luna, MD;  Location: [facility];  Service: General;  Laterality: Right;   BREAST SURGERY Left    lumpectomy 35 yrs ago    CARDIOVERSION N/A 12/31/2021   Procedure: CARDIOVERSION;  Surgeon: Donato Heinz, MD;  Location: [facility] ENDOSCOPY;  Service: Cardiovascular;  Laterality: N/A;   DILATION AND CURETTAGE OF UTERUS     NECK SURGERY     discectomy with cadaver bone   TEE WITHOUT CARDIOVERSION N/A 12/31/2021   Procedure: TRANSESOPHAGEAL ECHOCARDIOGRAM (TEE);  Surgeon: Donato Heinz, MD;  Location: [facility] ENDOSCOPY;  Service: Cardiovascular;  Laterality: N/A;   TONSILLECTOMY     removed as a child   TUBAL LIGATION       Current Outpatient Medications  Medication Sig Dispense Refill   acetaminophen (TYLENOL) 500 MG tablet Take 500 mg by mouth every 6 (six) hours as needed for mild pain.     ALPRAZolam (XANAX) 0.25 MG tablet Take 0.25 mg by mouth at bedtime as needed for anxiety.     Cholecalciferol (VITAMIN D) 50 MCG (2000 UT) CAPS Take 2,000 Units by mouth daily at 2 am.     diltiazem (CARDIZEM) 30 MG tablet Take 1 tablet every 4 hours AS NEEDED for AFIB heart rate >100 45 tablet 3   ELIQUIS 5 MG TABS tablet TAKE ONE TABLET BY MOUTH TWICE DAILY 180 tablet 1   furosemide (LASIX) 20 MG tablet Take 1 tablet (20 mg total) by mouth daily. (Patient taking differently: Take 10 mg by mouth daily.) 90 tablet 3   Lactobacillus (PROBIOTIC ACIDOPHILUS PO) Take 1 tablet by mouth daily.     metoprolol succinate (TOPROL-XL) 25 MG  24 hr tablet Take 1 tablet (25 mg total) by mouth daily. Take with or immediately following a meal. 90 tablet 3   potassium chloride (KLOR-CON) 10 MEQ tablet Take 1 tablet (10 mEq total) by mouth 2 (two) times daily. 180 tablet 3   Propylene Glycol 0.6 % SOLN Place 1 drop into both eyes 2 (two) times daily. systane eye drops     rosuvastatin (CRESTOR) 10 MG tablet Take 10 mg by mouth daily.     sacubitril-valsartan (ENTRESTO) 49-51 MG Take 1 tablet by mouth 2 (two) times daily. 180 tablet 1   tamoxifen (NOLVADEX) 20 MG tablet Take 1 tablet (20 mg total) by mouth daily. (Patient not taking:  Reported on 05/13/2022) 30 tablet 3   No current facility-administered medications for this visit.    Allergies:   Amlodipine besylate, Buspirone, Fluoxetine, Morphine and related, Penicillins, and Sertraline hcl   Social History:  The patient  reports that she has never smoked. She has never used smokeless tobacco. She reports that she does not drink alcohol and does not use drugs.   Family History:  The patient's family history includes CVA (age of onset: 80) in her mother; Colon cancer in her maternal grandfather; Heart attack (age of onset: 30) in her mother; Hypertension in her brother and sister; Prostate cancer (age of onset: 37) in her son; Unexplained death (age of onset: 54) in her father.    ROS:  Please see the history of present illness.   Otherwise, review of systems is positive for none.   All other systems are reviewed and negative.    PHYSICAL EXAM: VS:  BP 122/84   Pulse (!) 57   Ht '5\' 7"'$  (1.702 m)   Wt 207 lb 3.2 oz (94 kg)   SpO2 95%   BMI 32.45 kg/m  , BMI Body mass index is 32.45 kg/m. GEN: Well nourished, well developed, in no acute distress  HEENT: normal  Neck: no JVD, carotid bruits, or masses Cardiac: RRR; no murmurs, rubs, or gallops,no edema  Respiratory:  clear to auscultation bilaterally, normal work of breathing GI: soft, nontender, nondistended, + BS MS: no deformity or atrophy  Skin: warm and dry Neuro:  Strength and sensation are intact Psych: euthymic mood, full affect  EKG:  EKG is ordered today. Personal review of the ekg ordered shows sinus rhythm, PACs  Recent Labs: 12/12/2021: ALT 14 12/26/2021: TSH 0.819 12/28/2021: Magnesium 2.1 01/30/2022: Hemoglobin 14.2; Platelets 231 04/08/2022: BUN 20; Creatinine, Ser 0.82; Potassium 4.7; Sodium 146    Lipid Panel     Component Value Date/Time   CHOL 142 06/24/2015 0329   TRIG 118 06/24/2015 0329   HDL 36 (L) 06/24/2015 0329   CHOLHDL 3.9 06/24/2015 0329   VLDL 24 06/24/2015 0329   LDLCALC  82 06/24/2015 0329     Wt Readings from Last 3 Encounters:  05/13/22 207 lb 3.2 oz (94 kg)  03/20/22 210 lb 3.2 oz (95.3 kg)  02/13/22 214 lb 14.4 oz (97.5 kg)      Other studies Reviewed: Additional studies/ records that were reviewed today include: TTE 12/27/21  Review of the above records today demonstrates:   1. Left ventricular ejection fraction, by estimation, is 40 to 45%. The  left ventricle has mildly decreased function. The left ventricle  demonstrates global hypokinesis. The left ventricular internal cavity size  was mildly dilated. There is mild left  ventricular hypertrophy. Left ventricular diastolic function could not be  evaluated.   2. Right  ventricular systolic function is mildly reduced. The right  ventricular size is mildly enlarged. There is mildly elevated pulmonary  artery systolic pressure. The estimated right ventricular systolic  pressure is 72.6 mmHg.   3. Left atrial size was mildly dilated.   4. Right atrial size was moderately dilated.   5. A small pericardial effusion is present.   6. The mitral valve is normal in structure. Mild mitral valve  regurgitation.   7. Tricuspid valve regurgitation is mild to moderate.   8. The aortic valve is normal in structure. Aortic valve regurgitation is  not visualized. No aortic stenosis is present.   9. The inferior vena cava is dilated in size with <50% respiratory  variability, suggesting right atrial pressure of 15 mmHg.   Cardiac monitor 01/30/2022 personally reviewed Sinus rhythm average heart rate 52 bpm. Brief episodes of atrial tachycardia noted. PACs-9%, frequent Occasional PVCs-4% No evidence of atrial fibrillation noted. Overall reassuring. Continue with diltiazem and metoprolol.  ASSESSMENT AND PLAN:  1.  Paroxysmal atrial fibrillation: Status post cardioversion 12/31/2021.  She is unfortunately continued to have episodes of atrial fibrillation.  Currently on Eliquis and metoprolol.  CHA2DS2-VASc  of at least 4.  At this point, she would prefer to avoid medical management.  We Shavana Calder plan for ablation.  Risk, benefits, and alternatives to EP study and radiofrequency ablation for afib were also discussed in detail today. These risks include but are not limited to stroke, bleeding, vascular damage, tamponade, perforation, damage to the esophagus, lungs, and other structures, pulmonary vein stenosis, worsening renal function, and death. The patient understands these risk and wishes to proceed.  We Aysha Livecchi therefore proceed with catheter ablation at the next available time.  Carto, ICE, anesthesia are requested for the procedure.  Haylen Bellotti also obtain CT PV protocol prior to the procedure to exclude LAA thrombus and further evaluate atrial anatomy.   2.  Hypertension: Currently well controlled  3.  Chronic systolic heart failure: Currently on medical therapy per primary cardiology.  4.  Obstructive sleep apnea: CPAP compliance encouraged  5.  Secondary hypercoagulable state: Currently on Eliquis for atrial fibrillation as above.  Case discussed with primary cardiology  Current medicines are reviewed at length with the patient today.   The patient does not have concerns regarding her medicines.  The following changes were made today:  none  Labs/ tests ordered today include:  Orders Placed This Encounter  Procedures   CT CARDIAC MORPH/PULM VEIN W/CM&W/O CA SCORE     Disposition:   FU with Ambrielle Kington 3 months  Signed, Melchor Kirchgessner Meredith Leeds, MD  05/13/2022 11:48 AM     [address] [place] [address] [phone] (office) [phone] (fax)

## 2022-05-13 NOTE — Patient Instructions (Signed)
Medication Instructions:  Your physician recommends that you continue on your current medications as directed. Please refer to the Current Medication list given to you today.  *If you need a refill on your cardiac medications before your next appointment, please call your pharmacy*   Lab Work: Pre procedure labs -- at the cancer center 11/3:  BMP & CBC  If you have labs (blood work) drawn today and your tests are completely normal, you will receive your results only by: [facility] (if you have MyChart) OR A paper copy in the mail If you have any lab test that is abnormal or we need to change your treatment, we will call you to review the results.   Testing/Procedures: Your physician has requested that you have cardiac CT within 7 days PRIOR to your ablation. Cardiac computed tomography (CT) is a painless test that uses an x-ray machine to take clear, detailed pictures of your heart.  Please follow instruction below located under "other instructions". You will get a call from our office to schedule the date for this test.  Your physician has recommended that you have an ablation. Catheter ablation is a medical procedure used to treat some cardiac arrhythmias (irregular heartbeats). During catheter ablation, a long, thin, flexible tube is put into a blood vessel in your groin (upper thigh), or neck. This tube is called an ablation catheter. It is then guided to your heart through the blood vessel. Radio frequency waves destroy small areas of heart tissue where abnormal heartbeats may cause an arrhythmia to start. Please follow instruction letter given to you today.   Follow-Up: At [facility], you and your health needs are our priority.  As part of our continuing mission to provide you with exceptional heart care, we have created designated Provider Care Teams.  These Care Teams include your primary Cardiologist (physician) and Advanced Practice Providers (APPs -  Physician Assistants  and Nurse Practitioners) who all work together to provide you with the care you need, when you need it.  Your next appointment:   1 month(s) after your ablation  The format for your next appointment:   In Person  Provider:   AFib clinic   Thank you for choosing [facility]!!   Trinidad Curet, RN [phone]    Other Instructions  Cardiac Ablation Cardiac ablation is a procedure to destroy (ablate) some heart tissue that is sending bad signals. These bad signals cause problems in heart rhythm. The heart has many areas that make these signals. If there are problems in these areas, they can make the heart beat in a way that is not normal. Destroying some tissues can help make the heart rhythm normal. Tell your doctor about: Any allergies you have. All medicines you are taking. These include vitamins, herbs, eye drops, creams, and over-the-counter medicines. Any problems you or family members have had with medicines that make you fall asleep (anesthetics). Any blood disorders you have. Any surgeries you have had. Any medical conditions you have, such as kidney failure. Whether you are pregnant or may be pregnant. What are the risks? This is a safe procedure. But problems may occur, including: Infection. Bruising and bleeding. Bleeding into the chest. Stroke or blood clots. Damage to nearby areas of your body. Allergies to medicines or dyes. The need for a pacemaker if the normal system is damaged. Failure of the procedure to treat the problem. What happens before the procedure? Medicines Ask your doctor about: Changing or stopping your normal medicines. This  is important. Taking aspirin and ibuprofen. Do not take these medicines unless your doctor tells you to take them. Taking other medicines, vitamins, herbs, and supplements. General instructions Follow instructions from your doctor about what you cannot eat or drink. Plan to have someone take you home from the  hospital or clinic. If you will be going home right after the procedure, plan to have someone with you for 24 hours. Ask your doctor what steps will be taken to prevent infection. What happens during the procedure?  An IV tube will be put into one of your veins. You will be given a medicine to help you relax. The skin on your neck or groin will be numbed. A cut (incision) will be made in your neck or groin. A needle will be put through your cut and into a large vein. A tube (catheter) will be put into the needle. The tube will be moved to your heart. Dye may be put through the tube. This helps your doctor see your heart. Small devices (electrodes) on the tube will send out signals. A type of energy will be used to destroy some heart tissue. The tube will be taken out. Pressure will be held on your cut. This helps stop bleeding. A bandage will be put over your cut. The exact procedure may vary among doctors and hospitals. What happens after the procedure? You will be watched until you leave the hospital or clinic. This includes checking your heart rate, breathing rate, oxygen, and blood pressure. Your cut will be watched for bleeding. You will need to lie still for a few hours. Do not drive for 24 hours or as long as your doctor tells you. Summary Cardiac ablation is a procedure to destroy some heart tissue. This is done to treat heart rhythm problems. Tell your doctor about any medical conditions you may have. Tell him or her about all medicines you are taking to treat them. This is a safe procedure. But problems may occur. These include infection, bruising, bleeding, and damage to nearby areas of your body. Follow what your doctor tells you about food and drink. You may also be told to change or stop some of your medicines. After the procedure, do not drive for 24 hours or as long as your doctor tells you. This information is not intended to replace advice given to you by your health care  provider. Make sure you discuss any questions you have with your health care provider. Document Revised: 09/02/2019 Document Reviewed: 09/02/2019 Elsevier Patient Education  [name].

## 2022-05-13 NOTE — Addendum Note (Signed)
Addended by: Janan Halter F on: 05/13/2022 05:43 PM   Modules accepted: Orders

## 2022-05-14 ENCOUNTER — Encounter: Payer: Self-pay | Admitting: Physical Therapy

## 2022-05-17 ENCOUNTER — Encounter: Payer: Self-pay | Admitting: *Deleted

## 2022-05-22 ENCOUNTER — Encounter: Payer: Self-pay | Admitting: Internal Medicine

## 2022-05-23 ENCOUNTER — Encounter: Payer: Self-pay | Admitting: Adult Health

## 2022-05-23 ENCOUNTER — Other Ambulatory Visit: Payer: Self-pay

## 2022-05-23 ENCOUNTER — Inpatient Hospital Stay: Payer: Medicare PPO | Attending: Hematology | Admitting: Adult Health

## 2022-05-23 VITALS — BP 173/96 | HR 69 | Temp 98.2°F | Wt 206.9 lb

## 2022-05-23 DIAGNOSIS — Z79810 Long term (current) use of selective estrogen receptor modulators (SERMs): Secondary | ICD-10-CM | POA: Diagnosis not present

## 2022-05-23 DIAGNOSIS — Z79899 Other long term (current) drug therapy: Secondary | ICD-10-CM | POA: Diagnosis not present

## 2022-05-23 DIAGNOSIS — Z170 Estrogen receptor positive status [ER+]: Secondary | ICD-10-CM | POA: Diagnosis not present

## 2022-05-23 DIAGNOSIS — C50411 Malignant neoplasm of upper-outer quadrant of right female breast: Secondary | ICD-10-CM | POA: Diagnosis not present

## 2022-05-23 NOTE — Progress Notes (Signed)
[facility]  VISIT:   BRIEF ONCOLOGIC HISTORY:  Oncology History Overview Note   Cancer Staging  Malignant neoplasm of upper-outer quadrant of right breast in female, estrogen receptor positive ([place]) Staging form: Breast, AJCC 8th Edition - Clinical stage from 12/06/2021: Stage IA (cT1b, cN0, cM0, G2, ER+, PR+, HER2-) - Signed by Truitt Merle, MD on 12/11/2021 - Pathologic stage from 01/31/2022: Stage Unknown (pT1c, pNX, cM0, G2, ER+, PR+, HER2-) - Signed by Truitt Merle, MD on 02/12/2022     Malignant neoplasm of upper-outer quadrant of right breast in female, estrogen receptor positive ([place])  11/28/2021 Mammogram   CLINICAL DATA:  Patient returns today to evaluate possible RIGHT breast asymmetries identified on recent screening mammogram.   EXAM: DIGITAL DIAGNOSTIC UNILATERAL RIGHT MAMMOGRAM WITH TOMOSYNTHESIS AND CAD; ULTRASOUND RIGHT BREAST LIMITED  IMPRESSION: 1. Irregular hypoechoic mass in the RIGHT breast at the 10 o'clock axis, 7 cm from the nipple, measuring 9 mm, with associated architectural distortion, corresponding to the mammographic finding. This is a highly suspicious finding for which ultrasound-guided core biopsy is recommended. 2. Additional benign findings within the RIGHT breast, as detailed above. 3. No enlarged or morphologically abnormal lymph nodes in the RIGHT axilla.   12/06/2021 Cancer Staging   Staging form: Breast, AJCC 8th Edition - Clinical stage from 12/06/2021: Stage IA (cT1b, cN0, cM0, G2, ER+, PR+, HER2-) - Signed by Truitt Merle, MD on 12/11/2021 Stage prefix: Initial diagnosis Histologic grading system: 3 grade system   12/06/2021 Initial Biopsy   Diagnosis Breast, right, needle core biopsy, 10 o'clock, 7cmfn INVASIVE DUCTAL CARCINOMA WITH LOBULAR FEATURES, GRADE 2 (3+2+1)  An E-cadherin immunohistochemical stains performed with adequate control. This stain is diffusely negative within the tumor. Histologically the tumor forms ducts and is present in  cribriform sheets as well as focally present in cords and small solid nests. Given this histology it is felt the negative E-cadherin stain represents aberrant absence of expression within an otherwise ductal carcinoma. This lack of E-cadherin expression may explain the focal lobular features.  PROGNOSTIC INDICATORS Results: The tumor cells are EQUIVOCAL for Her2 (2+). Her2 by FISH will be performed and the results reported separately. Estrogen Receptor: 95%, POSITIVE, STRONG STAINING INTENSITY Progesterone Receptor: 80%, POSITIVE, STRONG STAINING INTENSITY Proliferation Marker Ki67: 2%   12/10/2021 Initial Diagnosis   Malignant neoplasm of upper-outer quadrant of right breast in female, estrogen receptor positive ([place])    Genetic Testing   [facility] CancerNext-Expanded Panel was Negative. Of note, a variant of uncertain significance was detected in the BRCA2 gene (p.P606L). Report date is 12/24/2021.  The CancerNext-Expanded gene panel offered by [facility] and includes sequencing, rearrangement, and RNA analysis for the following 77 genes: AIP, ALK, APC, ATM, AXIN2, BAP1, BARD1, BLM, BMPR1A, BRCA1, BRCA2, BRIP1, CDC73, CDH1, CDK4, CDKN1B, CDKN2A, CHEK2, CTNNA1, DICER1, FANCC, FH, FLCN, GALNT12, KIF1B, LZTR1, MAX, MEN1, MET, MLH1, MSH2, MSH3, MSH6, MUTYH, NBN, NF1, NF2, NTHL1, PALB2, PHOX2B, PMS2, POT1, PRKAR1A, PTCH1, PTEN, RAD51C, RAD51D, RB1, RECQL, RET, SDHA, SDHAF2, SDHB, SDHC, SDHD, SMAD4, SMARCA4, SMARCB1, SMARCE1, STK11, SUFU, TMEM127, TP53, TSC1, TSC2, VHL and XRCC2 (sequencing and deletion/duplication); EGFR, EGLN1, HOXB13, KIT, MITF, PDGFRA, POLD1, and POLE (sequencing only); EPCAM and GREM1 (deletion/duplication only).    01/31/2022 Cancer Staging   Staging form: Breast, AJCC 8th Edition - Pathologic stage from 01/31/2022: Stage Unknown (pT1c, pNX, cM0, G2, ER+, PR+, HER2-) - Signed by Truitt Merle, MD on 02/12/2022 Stage prefix: Initial diagnosis Histologic grading system: 3 grade  system Residual tumor (R): R0 - None  01/31/2022 Definitive Surgery   FINAL MICROSCOPIC DIAGNOSIS:   A. BREAST, RIGHT, LUMPECTOMY:  -  Invasive ductal carcinoma with lobular features, Nottingham grade 2 of 3, 1.5 cm  -  Ductal carcinoma in-situ, intermediate grade  -  Margins uninvolved by carcinoma (<0.1 cm; anterior margin)  -  Previous biopsy site changes present  -  Adenosis and fibrocystic changes  -  See oncology table and comment below   B. BREAST, RIGHT ADDITIONAL ANTERIOMEDIAL MARGIN, EXCISION:  -  Usual ductal hyperplasia  -  No residual carcinoma identified  -  See comment   C. BREAST, RIGHT ADDITIONAL ANTERIOPOSTERIOR MARGIN, EXCISION:  -  No residual carcinoma identified   D. BREAST, RIGHT ADDITIONAL LATEROINFERIOR MARGIN, EXCISION:  -  Focal residual ductal carcinoma in situ  -  Fibrocystic changes with metaplasia  -  Margins uninvolved by carcinoma (0.1 cm; inferior margin)  -  See comment    03/2022 -  Anti-estrogen oral therapy   Tamoxifen x 5 years     INTERVAL HISTORY:  Shannon Liu to review her survivorship care plan detailing her treatment course for breast cancer, as well as monitoring long-term side effects of that treatment, education regarding health maintenance, screening, and overall wellness and health promotion.     Overall, Shannon Liu reports feeling moderately well.  She notes that while taking tamoxifen 72m she experienced increased anxiety that resolved once she stopped taking the tamoxifen.  She denies any new issues today.    REVIEW OF SYSTEMS:  Review of Systems  Constitutional:  Negative for appetite change, chills, fatigue, fever and unexpected weight change.  HENT:   Negative for hearing loss, lump/mass and trouble swallowing.   Eyes:  Negative for eye problems and icterus.  Respiratory:  Negative for chest tightness, cough and shortness of breath.   Cardiovascular:  Negative for chest pain, leg swelling and palpitations.   Gastrointestinal:  Negative for abdominal distention, abdominal pain, constipation, diarrhea, nausea and vomiting.  Endocrine: Negative for hot flashes.  Genitourinary:  Negative for difficulty urinating.   Musculoskeletal:  Negative for arthralgias.  Skin:  Negative for itching and rash.  Neurological:  Negative for dizziness, extremity weakness, headaches and numbness.  Hematological:  Negative for adenopathy. Does not bruise/bleed easily.  Psychiatric/Behavioral:  Negative for depression. The patient is not nervous/anxious.   Breast: Denies any new nodularity, masses, tenderness, nipple changes, or nipple discharge.      ONCOLOGY TREATMENT TEAM:  1. Surgeon:  [facility] 2. Medical Oncologist: Dr. FBurr Medico 3. Radiation Oncologist: Dr. MLisbeth Renshaw   PAST MEDICAL/SURGICAL HISTORY:  Past Medical History:  Diagnosis Date   Anxiety    Arthritis    [place]   [place] breast   Dysrhythmia    A fib   HOH (hard of hearing)    Hyperlipemia    Hypertension    PAF (paroxysmal atrial fibrillation) ([place]    a. dx on 06/2015 admission. spontaneously converted into NSR. placed on Eliquis   Sleep apnea    CPAP   Past Surgical History:  Procedure Laterality Date   ABDOMINAL HYSTERECTOMY     BREAST CYST EXCISION Left    BREAST LUMPECTOMY WITH RADIOACTIVE SEED LOCALIZATION Right 01/31/2022   Procedure: RIGHT BREAST LUMPECTOMY WITH RADIOACTIVE SEED LOCALIZATION;  Surgeon: CErroll Luna MD;  [facility]  Service: General;  Laterality: Right;   BREAST SURGERY Left    lumpectomy 35 yrs ago   CARDIOVERSION N/A 12/31/2021  Procedure: CARDIOVERSION;  Surgeon: Donato Heinz, MD;  [facility] ENDOSCOPY;  Service: Cardiovascular;  Laterality: N/A;   DILATION AND CURETTAGE OF UTERUS     NECK SURGERY     discectomy with cadaver bone   TEE WITHOUT CARDIOVERSION N/A 12/31/2021   Procedure: TRANSESOPHAGEAL ECHOCARDIOGRAM (TEE);  Surgeon: Donato Heinz, MD;  Location: [facility];  Service: Cardiovascular;  Laterality: N/A;   TONSILLECTOMY     removed as a child   TUBAL LIGATION       ALLERGIES:  Allergies  Allergen Reactions   Amlodipine Besylate     Other reaction(s): edema   Buspirone     Other reaction(s): miserable   Fluoxetine     Other reaction(s): insomnia   Morphine And Related Nausea And Vomiting   Penicillins Nausea And Vomiting   Sertraline Hcl     Other reaction(s): nightmares, sexual disfunction     CURRENT MEDICATIONS:  Outpatient Encounter Medications as of 05/23/2022  Medication Sig   acetaminophen (TYLENOL) 500 MG tablet Take 500 mg by mouth every 6 (six) hours as needed for mild pain.   ALPRAZolam (XANAX) 0.25 MG tablet Take 0.25 mg by mouth at bedtime as needed for anxiety.   Cholecalciferol (VITAMIN D) 50 MCG (2000 UT) CAPS Take 2,000 Units by mouth daily at 2 am.   diltiazem (CARDIZEM) 30 MG tablet Take 1 tablet every 4 hours AS NEEDED for AFIB heart rate >100   ELIQUIS 5 MG TABS tablet TAKE ONE TABLET BY MOUTH TWICE DAILY   furosemide (LASIX) 20 MG tablet Take 1 tablet (20 mg total) by mouth daily. (Patient taking differently: Take 10 mg by mouth daily.)   Lactobacillus (PROBIOTIC ACIDOPHILUS PO) Take 1 tablet by mouth daily.   metoprolol succinate (TOPROL-XL) 25 MG 24 hr tablet Take 1 tablet (25 mg total) by mouth daily. Take with or immediately following a meal.   potassium chloride (KLOR-CON) 10 MEQ tablet Take 1 tablet (10 mEq total) by mouth 2 (two) times daily.   Propylene Glycol 0.6 % SOLN Place 1 drop into both eyes 2 (two) times daily. systane eye drops   rosuvastatin (CRESTOR) 10 MG tablet Take 10 mg by mouth daily.   sacubitril-valsartan (ENTRESTO) 49-51 MG Take 1 tablet by mouth 2 (two) times daily.   tamoxifen (NOLVADEX) 20 MG tablet Take 1 tablet (20 mg total) by mouth daily. (Patient not taking: Reported on 05/13/2022)   No facility-administered encounter medications on file  as of 05/23/2022.     ONCOLOGIC FAMILY HISTORY:  Family History  Problem Relation Age of Onset   Heart attack Mother 54   CVA Mother 6   Unexplained death Father 91   Hypertension Sister    Hypertension Brother    Colon cancer Maternal Grandfather    Prostate cancer Son 61   Breast cancer Neg Hx        SOCIAL HISTORY:  Social History   Socioeconomic History   Marital status: Widowed    Spouse name: Not on file   Number of children: 2   Years of education: Not on file   Highest education level: Not on file  Occupational History   Occupation: Retired  Tobacco Use   Smoking status: Never   Smokeless tobacco: Never  Vaping Use   Vaping Use: Never used  Substance and Sexual Activity   Alcohol use: No    Alcohol/week: 0.0 standard drinks of alcohol   Drug use: No   Sexual activity: Not on file  Other Topics Concern   Not on file  [place], near [place]. Daughter currently lives with her.   Social Determinants of Health   Financial Resource Strain: Not on file  Food Insecurity: Not on file  Transportation Needs: Not on file  Physical Activity: Not on file  Stress: Not on file  Social Connections: Not on file  Intimate Partner Violence: Not on file     OBSERVATIONS/OBJECTIVE:  BP (!) 173/96 Comment: nurse notified  Pulse 69   Temp 98.2 F (36.8 C) (Temporal)   Wt 206 lb 14.4 oz (93.8 kg)   SpO2 98%   BMI 32.41 kg/m  GENERAL: Patient is a well appearing female in no acute distress HEENT:  Sclerae anicteric.  Oropharynx clear and moist. No ulcerations or evidence of oropharyngeal candidiasis. Neck is supple.  NODES:  No cervical, supraclavicular, or axillary lymphadenopathy palpated.  BREAST EXAM:  Deferred. LUNGS:  Clear to auscultation bilaterally.  No wheezes or rhonchi. HEART:  Regular rate and rhythm. No murmur appreciated. ABDOMEN:  Soft, nontender.  Positive, normoactive bowel sounds. No  organomegaly palpated. MSK:  No focal spinal tenderness to palpation. Full range of motion bilaterally in the upper extremities. EXTREMITIES:  No peripheral edema.   SKIN:  Clear with no obvious rashes or skin changes. No nail dyscrasia. NEURO:  Nonfocal. Well oriented.  Appropriate affect.   LABORATORY DATA:  None for this visit.  DIAGNOSTIC IMAGING:  None for this visit.      ASSESSMENT AND PLAN:  Ms.. Liu is a pleasant 81 y.o. female with Stage 1A right breast invasive ductal carcinoma, ER+/PR+/HER2-, diagnosed in February 2023, treated with lumpectomy and anti-estrogen therapy with tamoxifen beginning in June 2023.  She presents to the Survivorship Clinic for our initial meeting and routine follow-up post-completion of treatment for breast cancer.    1. Stage 1A right breast cancer:  Shannon Liu is continuing to recover from definitive treatment for breast cancer. She will follow-up with her medical oncologist, Dr. Burr Medico in 08/2022 with history and physical exam per surveillance protocol.  She was unable to tolerate the tamoxifen when she initially started it.  We discussed this in detail and we discussed other options for her antiestrogen therapy as well as the risk of recurrence in the absence of antiestrogen therapy.  After discussion she has opted to take tamoxifen 10 mg daily.  Her mammogram is due January 2024. Today, a comprehensive survivorship care plan and treatment summary was reviewed with the patient today detailing her breast cancer diagnosis, treatment course, potential late/long-term effects of treatment, appropriate follow-up care with recommendations for the future, and patient education resources.  A copy of this summary, along with a letter will be sent to the patient's primary care provider via mail/fax/In Basket message after today's visit.    2. Bone health: She is not taking an aromatase inhibitor however due to her age and the possibility that she may need to try an  aromatase inhibitor in the future we will go ahead and order bone density testing.  She was given education on specific activities to promote bone health.  3. Cancer screening:  Due to Shannon Liu's history and her age, she should receive screening for skin cancers.  The information and recommendations are listed on the patient's comprehensive care plan/treatment summary and were reviewed in detail with the patient.    4. Health maintenance and wellness promotion: Shannon Liu was encouraged to consume 5-7 servings  of fruits and vegetables per day. We reviewed the "Nutrition Rainbow" handout.  She was also encouraged to engage in moderate to vigorous exercise for 30 minutes per day most days of the week. We discussed the [employer] fitness program, which is designed for cancer survivors to help them become more physically fit after cancer treatments.  She was instructed to limit her alcohol consumption and continue to abstain from tobacco use.     5. Support services/counseling: It is not uncommon for this period of the patient's cancer care trajectory to be one of many emotions and stressors.  She was given information regarding our available services and encouraged to contact me with any questions or for help enrolling in any of our support group/programs.    Follow up instructions:    -Return to cancer center in November 2023 for follow-up with Dr. Morey Hummingbird -Mammogram due in January 2024 -Bone density testing ordered. -She is welcome to return back to the Survivorship Clinic at any time; no additional follow-up needed at this time.  -Consider referral back to survivorship as a long-term survivor for continued surveillance  The patient was provided an opportunity to ask questions and all were answered. The patient agreed with the plan and demonstrated an understanding of the instructions.   Total encounter time:40 minutes*in face-to-face visit time, chart review, lab review, care coordination, order  entry, and documentation of the encounter time.    Shannon Bihari, NP 05/23/22 11:16 AM Medical Oncology and Hematology [facility] [address] [place], [place] [address] Tel. [phone]    Fax. [phone]  *Total Encounter Time as defined by the Centers for Medicare and Medicaid Services includes, in addition to the face-to-face time of a patient visit (documented in the note above) non-face-to-face time: obtaining and reviewing outside history, ordering and reviewing medications, tests or procedures, care coordination (communications with other health care professionals or caregivers) and documentation in the medical record.

## 2022-05-30 ENCOUNTER — Ambulatory Visit (HOSPITAL_BASED_OUTPATIENT_CLINIC_OR_DEPARTMENT_OTHER)
Admission: RE | Admit: 2022-05-30 | Discharge: 2022-05-30 | Disposition: A | Discharge status: Home / Self Care | Payer: Medicare PPO | Source: Ambulatory Visit | Attending: Adult Health | Admitting: Adult Health

## 2022-05-30 ENCOUNTER — Telehealth: Payer: Self-pay | Admitting: *Deleted

## 2022-05-30 DIAGNOSIS — Z13820 Encounter for screening for osteoporosis: Secondary | ICD-10-CM | POA: Diagnosis not present

## 2022-05-30 DIAGNOSIS — C50411 Malignant neoplasm of upper-outer quadrant of right female breast: Secondary | ICD-10-CM | POA: Diagnosis not present

## 2022-05-30 DIAGNOSIS — Z780 Asymptomatic menopausal state: Secondary | ICD-10-CM | POA: Insufficient documentation

## 2022-05-30 DIAGNOSIS — Z170 Estrogen receptor positive status [ER+]: Secondary | ICD-10-CM | POA: Diagnosis not present

## 2022-05-30 NOTE — Telephone Encounter (Signed)
Per Annabelle Harman, called and left vmail on pt personal cell with message below. Advised pt to call ofice with any concern

## 2022-05-30 NOTE — Telephone Encounter (Signed)
-----   Message from Gardenia Phlegm, NP sent at 05/30/2022  3:18 PM EDT ----- Normal bone density please notify patient ----- Message ----- From: Interface, Rad Results In Sent: 05/30/2022   1:35 PM EDT To: Gardenia Phlegm, NP

## 2022-06-08 ENCOUNTER — Other Ambulatory Visit: Payer: Self-pay | Admitting: Hematology

## 2022-06-12 ENCOUNTER — Telehealth: Payer: Self-pay | Admitting: Cardiology

## 2022-06-12 NOTE — Progress Notes (Unsigned)
Cardiology Office Note:    Date:  06/13/2022   ID:  Shannon Liu, DOB [birth year], MRN [id number]  PCP:  Colon Branch, MD   [facility] Providers Cardiologist:  Candee Furbish, MD Electrophysiologist:  Will Meredith Leeds, MD     Referring MD: Colon Branch, MD   Chief Complaint: follow-up atrial fibrillation  History of Present Illness:    Shannon Liu is a very pleasant 81 y.o. female with a hx of PAF, hypertension, hyperlipidemia, OSA on CPAP, and breast cancer.   Was seen in [facility] in the past. Has maintained anticoagulation on Eliquis. She presented to ED 12/26/21 with hypertension and a fib. She was at [facility] on 3/15 for a scheduled lumpectomy for a malignant neoplasm in right breast and was found to be in a fib with RVR in preop. She was given esmolol and metoprolol and advised to present to [facility] ED for cardiology evaluation. She had stopped Eliquis 12/23/21 in preparation for surgery. Echo 12/27/21 showed LVEF 40-45% and global hypokinesis, mildly dilated LV, mild LVH, mildly reduced RVF, mildly enlarged RV, mildly elevated PASP, mild LAE, moderate RAE, small pericardial effusion, mild MR, and mild-moderate TR. On 12/31/21 she underwent TEE/cardioversion with restoration to NSR and patient was discharged 12/31/21.   She sent a message to our office 01/04/22 with concerns for more frequent heart racing episodes and almost constant palpitations, also feeling fatigued. HR was in 160s bpm.  Following cardioversion, she was feeling poorly with worsening shortness of breath, weight gain, and bloating. She was advised to take 20 mg furosemide. HR was in the 40s and was feeling weak and fatigued so office visit was scheduled.   Seen in our office on 01/09/2022 by Dr. Marlou Porch at which time heart rate was slow and she was feeling symptoms of a fib. Diltiazem 30 mg was helping relieve s/s of a fib. Dr. Marlou Porch reduced her Toprol from 50 mg to 25 mg due to bradycardia and fatigue. She was  continued on Cardizem CD1 120 mg a day. Zio cardiac monitor was ordered for 2 weeks. Her weight gain had improved with addition of Lasix. She was advised to follow-up in 2 weeks.  Breast lumpectomy rescheduled for 01/31/22 with plan to hold Eliquis for 2 days preop due to continuation of AC for one month post DCCV.   Seen by me on 01/28/2022 for preoperative evaluation for upcoming breast surgery. Feeling better, more energy and has noticed that heart rate is no longer in the 40s (now 50s-low 60s) on the lower dose of metoprolol.  Continues to have palpitations and "flutters" as well as fatigue. Home BP has been elevated 140s over 70s.  PCP planned for her to work with pharmacist for better blood pressure control, however she was dealing with the new diagnosis of breast cancer and did not complete the appointment. Uses CPAP every night.  Was previously walking a mile 3 times a week, but has not done so lately.  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 Cardiac monitor revealed sinus rhythm with average heart rate 52 bpm, brief episodes of atrial tachycardia, PACs 9%, occasional PVCs 4%, no evidence of atrial fibrillation.  She was advised to continue with diltiazem and metoprolol.  She was advised to resume telmisartan for elevated BP.  Seen again by me on 03/20/22. Reported her breast surgery went well and she will be taking oral chemotherapy, no  other chemotherapy or radiation needed.  Home BP readings stable with a few elevated readings > 478 systolic.  Reports resting HR is 40-50. It increases to the 60s when she is active, however she wonders if it is making her feel more fatigued. She denied lightheadedness, presyncope, syncope, chest pain, shortness of breath, lower extremity edema, fatigue, palpitations, melena, hematuria, hemoptysis, diaphoresis, weakness, presyncope, syncope, orthopnea, and PND.  Seen by Dr. Curt Bears on 05/13/22.  Failed  cardioversion 01/01/2019 through with continued episodes of atrial fibrillation.  She would prefer to avoid medical management.  She is scheduled for A-fib ablation 09/10/2022.  Today, she is here alone for evaluation of elevated BP.  Daytime BP tends to run upper 295A to 213 systolic, 08M diastolic. Home readings are highest in the evening 172/90, this is prior to taking evening dose of Entresto. Admits she may check BP too frequently. Is feeling well most days. Feels like balance is a little off at times. Continues to attend exercise class 2 days per week, "gets winded" at times. No chest pain. Also gets winded walking around her property and up steps/inclines. Feels better within seconds of sitting down. She denies lower extremity edema, fatigue, palpitations, melena, hematuria, hemoptysis, diaphoresis, weakness, presyncope, syncope, orthopnea, and PND.  Past Medical History:  Diagnosis Date   Anxiety    Arthritis    Cancer ([place]) 2023   right breast   Dysrhythmia    A fib   HOH (hard of hearing)    Hyperlipemia    Hypertension    PAF (paroxysmal atrial fibrillation) ([place])    a. dx on 06/2015 admission. spontaneously converted into NSR. placed on Eliquis   Sleep apnea    CPAP    Past Surgical History:  Procedure Laterality Date   ABDOMINAL HYSTERECTOMY     BREAST CYST EXCISION Left    BREAST LUMPECTOMY WITH RADIOACTIVE SEED LOCALIZATION Right 01/31/2022   Procedure: RIGHT BREAST LUMPECTOMY WITH RADIOACTIVE SEED LOCALIZATION;  Surgeon: Erroll Luna, MD;  Location: [facility];  Service: General;  Laterality: Right;   BREAST SURGERY Left    lumpectomy 35 yrs ago   CARDIOVERSION N/A 12/31/2021   Procedure: CARDIOVERSION;  Surgeon: Donato Heinz, MD;  Location: [facility] ENDOSCOPY;  Service: Cardiovascular;  Laterality: N/A;   DILATION AND CURETTAGE OF UTERUS     NECK SURGERY     discectomy with cadaver bone   TEE WITHOUT CARDIOVERSION N/A 12/31/2021   Procedure: TRANSESOPHAGEAL  ECHOCARDIOGRAM (TEE);  Surgeon: Donato Heinz, MD;  Location: [facility] ENDOSCOPY;  Service: Cardiovascular;  Laterality: N/A;   TONSILLECTOMY     removed as a child   TUBAL LIGATION      Current Medications: Current Meds  Medication Sig   acetaminophen (TYLENOL) 500 MG tablet Take 500 mg by mouth every 6 (six) hours as needed for mild pain.   ALPRAZolam (XANAX) 0.25 MG tablet Take 0.25 mg by mouth at bedtime as needed for anxiety.   Cholecalciferol (VITAMIN D) 50 MCG (2000 UT) CAPS Take 2,000 Units by mouth daily.   ELIQUIS 5 MG TABS tablet TAKE ONE TABLET BY MOUTH TWICE DAILY   furosemide (LASIX) 20 MG tablet Take 10 mg by mouth daily.   Lactobacillus (PROBIOTIC ACIDOPHILUS PO) Take 1 tablet by mouth daily.   metoprolol succinate (TOPROL-XL) 25 MG 24 hr tablet Take 1 tablet (25 mg total) by mouth daily. Take with or immediately following a meal.   potassium chloride (KLOR-CON) 10 MEQ tablet Take 1 tablet (10 mEq total)  by mouth 2 (two) times daily.   Propylene Glycol 0.6 % SOLN Place 1 drop into both eyes 2 (two) times daily. systane eye drops   rosuvastatin (CRESTOR) 10 MG tablet Take 10 mg by mouth daily.   sacubitril-valsartan (ENTRESTO) 49-51 MG Take 1 tablet by mouth 2 (two) times daily.   tamoxifen (NOLVADEX) 20 MG tablet TAKE ONE TABLET BY MOUTH ONCE DAILY     Allergies:   Amlodipine besylate, Buspirone, Fluoxetine, Morphine and related, Penicillins, and Sertraline hcl   Social History   Socioeconomic History   Marital status: Widowed    Spouse name: Not on file   Number of children: 2   Years of education: Not on file   Highest education level: Not on file  Occupational History   Occupation: Retired  Tobacco Use   Smoking status: Never   Smokeless tobacco: Never  Vaping Use   Vaping Use: Never used  Substance and Sexual Activity   Alcohol use: No    Alcohol/week: 0.0 standard drinks of alcohol   Drug use: No   Sexual activity: Not on file  Other Topics  Concern   Not on file  [facility], near [place]. Daughter currently lives with her.   Social Determinants of Health   Financial Resource Strain: Not on file  Food Insecurity: Not on file  Transportation Needs: Not on file  Physical Activity: Not on file  Stress: Not on file  Social Connections: Not on file     Family History: The patient's family history includes CVA (age of onset: 26) in her mother; Colon cancer in her maternal grandfather; Heart attack (age of onset: 3) in her mother; Hypertension in her brother and sister; Prostate cancer (age of onset: 5) in her son; Unexplained death (age of onset: 44) in her father. There is no history of Breast cancer.  ROS:   Please see the history of present illness.  All other systems reviewed and are negative.  Labs/Other Studies Reviewed:    The following studies were reviewed today:  Cardiac monitor 01/30/22  Sinus rhythm average heart rate 52 bpm. Brief episodes of atrial tachycardia noted. PACs-9%, frequent Occasional PVCs-4% No evidence of atrial fibrillation noted. Overall reassuring. Continue with diltiazem and metoprolol.  TTE Complete 12/27/21  Left Ventricle: Left ventricular ejection fraction, by estimation, is 40  to 45%. The left ventricle has mildly decreased function. The left  ventricle demonstrates global hypokinesis. The left ventricular internal  cavity size was mildly dilated. There is   mild left ventricular hypertrophy. Left ventricular diastolic function  could not be evaluated due to atrial fibrillation. Left ventricular  diastolic function could not be evaluated.  Right Ventricle: The right ventricular size is mildly enlarged. Right  vetricular wall thickness was not well visualized. Right ventricular  systolic function is mildly reduced. There is mildly elevated pulmonary  artery systolic pressure. The tricuspid  regurgitant velocity is 2.30 m/s, and  with an assumed right atrial  pressure of 15 mmHg, the estimated right ventricular systolic pressure is  46.6 mmHg.  Left Atrium: Left atrial size was mildly dilated.  Right Atrium: Right atrial size was moderately dilated.  Pericardium: A small pericardial effusion is present.  Mitral Valve: The mitral valve is normal in structure. Mild mitral valve  regurgitation.  Tricuspid Valve: The tricuspid valve is normal in structure. Tricuspid  valve regurgitation is mild to moderate.  Aortic Valve: The aortic valve is normal in structure. Aortic  valve  regurgitation is not visualized. No aortic stenosis is present. Aortic  valve peak gradient measures 4.8 mmHg.  Pulmonic Valve: The pulmonic valve was not well visualized. Pulmonic valve  regurgitation is not visualized.  Aorta: The aortic root and ascending aorta are structurally normal, with  no evidence of dilitation.  Venous: The inferior vena cava is dilated in size with less than 50%  respiratory variability, suggesting right atrial pressure of 15 mmHg.  IAS/Shunts: The atrial septum is grossly normal.   Echo TEE 12/31/21   1. Left ventricular ejection fraction, by estimation, is 40 to 45%. The  left ventricle has mildly decreased function.   2. Right ventricular systolic function is mildly reduced. The right  ventricular size is normal.   3. Left atrial size was mildly dilated. No left atrial/left atrial  appendage thrombus was detected.   4. Right atrial size was mildly dilated.   5. A small pericardial effusion is present.   6. The mitral valve is normal in structure. Mild mitral valve  regurgitation.   7. Tricuspid valve regurgitation is mild to moderate.   8. The aortic valve is tricuspid. Aortic valve regurgitation is not  visualized.   9. Aortic dilatation noted. There is mild dilatation of the ascending  aorta, measuring 38 mm.   Conclusion(s)/Recommendation(s): No LA/LAA thrombus identified. Successful  cardioversion  performed with restoration of normal sinus rhythm.    Lexiscan myoview 06/2015   Nuclear stress EF: 70%. There was no ST segment deviation noted during stress. The study is normal. No ischemia. No evidence of infarction This is a low risk study.    Recent Labs: 12/12/2021: ALT 14 12/26/2021: TSH 0.819 12/28/2021: Magnesium 2.1 01/30/2022: Hemoglobin 14.2; Platelets 231 04/08/2022: BUN 20; Creatinine, Ser 0.82; Potassium 4.7; Sodium 146  Recent Lipid Panel    Component Value Date/Time   CHOL 142 06/24/2015 0329   TRIG 118 06/24/2015 0329   HDL 36 (L) 06/24/2015 0329   CHOLHDL 3.9 06/24/2015 0329   VLDL 24 06/24/2015 0329   LDLCALC 82 06/24/2015 0329     Risk Assessment/Calculations:     CHA2DS2-VASc Score = 5  {This indicates a 7.2% annual risk of stroke. The patient's score is based upon: CHF History: 1 HTN History: 1 Diabetes History: 0 Stroke History: 0 Vascular Disease History: 0 Age Score: 2 Gender Score: 1    Physical Exam:    VS:  BP 132/86   Pulse 64   Ht '5\' 7"'$  (1.702 m)   Wt 207 lb 9.6 oz (94.2 kg)   SpO2 98%   BMI 32.51 kg/m     Wt Readings from Last 3 Encounters:  06/13/22 207 lb 9.6 oz (94.2 kg)  05/23/22 206 lb 14.4 oz (93.8 kg)  05/13/22 207 lb 3.2 oz (94 kg)     GEN:  Well nourished, well developed in no acute distress HEENT: Normal NECK: No JVD; No carotid bruits CARDIAC: Irregular RR, no murmurs, rubs, gallops RESPIRATORY:  Clear to auscultation without rales, wheezing or rhonchi  ABDOMEN: Soft, non-tender, non-distended MUSCULOSKELETAL:  No edema. No deformity. 2+ pedal pulses, equal bilaterally SKIN: Warm and dry NEUROLOGIC:  Alert and oriented x 3 PSYCHIATRIC:  Normal affect   EKG:  EKG is not ordered today  Diagnoses:    1. Chronic HFrEF (heart failure with reduced ejection fraction) ([place])   2. Medication management   3. Persistent atrial fibrillation ([place])   4. Secondary hypercoagulable state ([place])   5. OSA on CPAP  6.  Hyperlipidemia LDL goal <70   7. Essential hypertension      Assessment and Plan:     Persistent atrial fibrillation on chronic anticoagulation: Seen by Dr. Curt Bears 05/13/22, scheduled for a fib ablation in November. Eliquis dose is adequate for age/weight/creatinine. Rarely takes diltiazem because resting HR is low. Continue Eliquis, metoprolol.    Essential hypertension: BP is well-controlled here.  She reports typically good readings in the morning and higher readings at night.  She is taking her BP prior to her evening dose of Entresto.  I suspect her BP is better 1 to 2 hours after taking the evening dose of Entresto.  Advised her to monitor at this time and report back to [place] in 1 to 2 weeks.   Chronic HFrEF: LVEF 40-45%, global hypokinesis, mild LVH, unable to evaluate diastolic dysfunction by echo 12/27/21. No evidence of volume overload on exam. Occasional mild dyspnea that has not worsened recently, seems appropriate for her level of activity and EF. No edema, orthopnea, PND. Weight is stable. Tolerating current medications without concerning side effects including Entresto, metoprolol, low-dose Lasix. Will take evening dose of Entresto earlier to see if nighttime BP improves.   OSA on CPAP:  Compliant with CPAP. No concerns.   Hyperlipidemia: LDL 113 on 03/28/22. On low dose rosuvastatin. Not specifically addressed at this visit.   Disposition: 4 months with Dr. Marlou Porch   Medication Adjustments/Labs and Tests Ordered: Current medicines are reviewed at length with the patient today.  Concerns regarding medicines are outlined above.  Orders Placed This Encounter  Procedures   Basic Metabolic Panel (BMET)   No orders of the defined types were placed in this encounter.   Patient Instructions  Medication Instructions:   Your physician recommends that you continue on your current medications as directed. Please refer to the Current Medication list given to you today.   *If you  need a refill on your cardiac medications before your next appointment, please call your pharmacy*   Lab Work:  None ordered.  If you have labs (blood work) drawn today and your tests are completely normal, you will receive your results only by: [place] (if you have MyChart) OR A paper copy in the mail If you have any lab test that is abnormal or we need to change your treatment, we will call you to review the results.   Testing/Procedures:  TODAY!!!! BMET    Follow-Up: At [facility], you and your health needs are our priority.  As part of our continuing mission to provide you with exceptional heart care, we have created designated Provider Care Teams.  These Care Teams include your primary Cardiologist (physician) and Advanced Practice Providers (APPs -  Physician Assistants and Nurse Practitioners) who all work together to provide you with the care you need, when you need it.  We recommend signing up for the patient portal called "MyChart".  Sign up information is provided on this After Visit Summary.  MyChart is used to connect with patients for Virtual Visits (Telemedicine).  Patients are able to view lab/test results, encounter notes, upcoming appointments, etc.  Non-urgent messages can be sent to your provider as well.   To learn more about what you can do with MyChart, go to [email or web address].    Your next appointment:   5 month(s)  The format for your next appointment:   In Person  Provider:   Candee Furbish, MD     Other Instructions HOW TO TAKE YOUR BLOOD  PRESSURE: Rest 5 minutes before taking your blood pressure.  Don't smoke or drink caffeinated beverages for at least 30 minutes before. Take your blood pressure before (not after) you eat. Sit comfortably with your back supported and both feet on the floor (don't cross your legs). Elevate your arm to heart level on a table or a desk. Use the proper sized cuff. It should fit smoothly and snugly  around your bare upper arm. There should be enough room to slip a fingertip under the cuff. The bottom edge of the cuff should be 1 inch above the crease of the elbow. Please monitor your blood pressure and if your blood pressure consistently remains above 140 on the top or 80 on the bottom x3 please call our office at [phone] or send a MyChart message in about a 1 week with readings.   Important Information About Sugar         Signed, Shannon Life, NP  06/13/2022 12:32 PM    [place]

## 2022-06-12 NOTE — Telephone Encounter (Signed)
Pt c/o BP issue: STAT if pt c/o blurred vision, one-sided weakness or slurred speech  1. What are your last 5 BP readings?  8/30:  154/85 (before meds) 149/80 (after meds)  2. Are you having any other symptoms (ex. Dizziness, headache, blurred vision, passed out)?  No symptoms  3. What is your BP issue?   BP has been elevated

## 2022-06-12 NOTE — Telephone Encounter (Signed)
Spoke with patient who has concerns regarding her blood pressure readings. 154/85 (before meds) 149/80 (after meds).  She has an appoint in September with Dr. Marlou Porch but had to reschedule for October.  Provided education on when to check BP and to wait a few hours after taking her medication. She is scheduled with Lorenda Peck for 9/31/23.

## 2022-06-13 ENCOUNTER — Encounter: Payer: Self-pay | Admitting: Nurse Practitioner

## 2022-06-13 ENCOUNTER — Ambulatory Visit: Payer: Medicare PPO | Attending: Nurse Practitioner | Admitting: Nurse Practitioner

## 2022-06-13 VITALS — BP 132/86 | HR 64 | Ht 67.0 in | Wt 207.6 lb

## 2022-06-13 DIAGNOSIS — I10 Essential (primary) hypertension: Secondary | ICD-10-CM | POA: Diagnosis not present

## 2022-06-13 DIAGNOSIS — Z79899 Other long term (current) drug therapy: Secondary | ICD-10-CM | POA: Diagnosis not present

## 2022-06-13 DIAGNOSIS — G4733 Obstructive sleep apnea (adult) (pediatric): Secondary | ICD-10-CM | POA: Diagnosis not present

## 2022-06-13 DIAGNOSIS — E785 Hyperlipidemia, unspecified: Secondary | ICD-10-CM | POA: Diagnosis not present

## 2022-06-13 DIAGNOSIS — Z9989 Dependence on other enabling machines and devices: Secondary | ICD-10-CM

## 2022-06-13 DIAGNOSIS — I5022 Chronic systolic (congestive) heart failure: Secondary | ICD-10-CM | POA: Diagnosis not present

## 2022-06-13 DIAGNOSIS — D6869 Other thrombophilia: Secondary | ICD-10-CM | POA: Diagnosis not present

## 2022-06-13 DIAGNOSIS — I4819 Other persistent atrial fibrillation: Secondary | ICD-10-CM

## 2022-06-13 LAB — BASIC METABOLIC PANEL
BUN/Creatinine Ratio: 20 (ref 12–28)
BUN: 16 mg/dL (ref 8–27)
CO2: 27 mmol/L (ref 20–29)
Calcium: 9.8 mg/dL (ref 8.7–10.3)
Chloride: 106 mmol/L (ref 96–106)
Creatinine, Ser: 0.81 mg/dL (ref 0.57–1.00)
Glucose: 103 mg/dL — ABNORMAL HIGH (ref 70–99)
Potassium: 5.2 mmol/L (ref 3.5–5.2)
Sodium: 144 mmol/L (ref 134–144)
eGFR: 73 mL/min/{1.73_m2} (ref >59)

## 2022-06-13 NOTE — Patient Instructions (Addendum)
Medication Instructions:   Your physician recommends that you continue on your current medications as directed. Please refer to the Current Medication list given to you today.   *If you need a refill on your cardiac medications before your next appointment, please call your pharmacy*   Lab Work:  None ordered.  If you have labs (blood work) drawn today and your tests are completely normal, you will receive your results only by: [place] (if you have MyChart) OR A paper copy in the mail If you have any lab test that is abnormal or we need to change your treatment, we will call you to review the results.   Testing/Procedures:  TODAY!!!! BMET    Follow-Up: At [facility], you and your health needs are our priority.  As part of our continuing mission to provide you with exceptional heart care, we have created designated Provider Care Teams.  These Care Teams include your primary Cardiologist (physician) and Advanced Practice Providers (APPs -  Physician Assistants and Nurse Practitioners) who all work together to provide you with the care you need, when you need it.  We recommend signing up for the patient portal called "MyChart".  Sign up information is provided on this After Visit Summary.  MyChart is used to connect with patients for Virtual Visits (Telemedicine).  Patients are able to view lab/test results, encounter notes, upcoming appointments, etc.  Non-urgent messages can be sent to your provider as well.   To learn more about what you can do with MyChart, go to [email or web address].    Your next appointment:   5 month(s)  The format for your next appointment:   In Person  Provider:   Candee Furbish, MD     Other Instructions HOW TO TAKE YOUR BLOOD PRESSURE: Rest 5 minutes before taking your blood pressure.  Don't smoke or drink caffeinated beverages for at least 30 minutes before. Take your blood pressure before (not after) you eat. Sit comfortably  with your back supported and both feet on the floor (don't cross your legs). Elevate your arm to heart level on a table or a desk. Use the proper sized cuff. It should fit smoothly and snugly around your bare upper arm. There should be enough room to slip a fingertip under the cuff. The bottom edge of the cuff should be 1 inch above the crease of the elbow. Please monitor your blood pressure and if your blood pressure consistently remains above 140 on the top or 80 on the bottom x3 please call our office at [phone] or send a MyChart message in about a 1 week with readings.   Important Information About Sugar

## 2022-06-19 ENCOUNTER — Encounter: Payer: Self-pay | Admitting: Hematology

## 2022-06-20 ENCOUNTER — Telehealth: Payer: Self-pay

## 2022-06-20 MED ORDER — FUROSEMIDE 20 MG PO TABS: 10.0000 mg | ORAL_TABLET | Freq: Every day | ORAL | 3 refills | Status: DC

## 2022-06-20 NOTE — Telephone Encounter (Signed)
Pt calling requesting a refill on furosemide 10 mg tablets. Pt is prescribe furosemide 20 mg tablet, pt takes 1/2 tablet daily. Pt states that she does not want to cut the tablets in half anymore and wanted to know if Dr. Marlou Porch could prescribe the 10 mg tablets instead, so she will not have to cut them. Would Dr. Marlou Porch like to prescribe this medication? Please address

## 2022-06-20 NOTE — Telephone Encounter (Signed)
Called pt to inform her that furosemide does not come in 10 mg and that I sent in furosemide 20 mg to pt's pharmacy. Pt verbalized understanding. Pt's medication was sent to pt's pharmacy as requested. Confirmation received.

## 2022-06-21 ENCOUNTER — Telehealth: Payer: Self-pay | Admitting: Physician Assistant

## 2022-06-21 ENCOUNTER — Other Ambulatory Visit: Payer: Self-pay | Admitting: Hematology

## 2022-06-21 MED ORDER — AMLODIPINE BESYLATE 2.5 MG PO TABS: 2.5000 mg | ORAL_TABLET | Freq: Every day | ORAL | 3 refills | Status: DC

## 2022-06-21 MED ORDER — ANASTROZOLE 1 MG PO TABS: 1.0000 mg | ORAL_TABLET | Freq: Every day | ORAL | 1 refills | Status: DC

## 2022-06-21 NOTE — Telephone Encounter (Signed)
Patient reported elevated blood pressure for past few weeks.  Most recent blood pressure 171/85.  I reviewed recent office visit note.  Patient is anxious at baseline.  She will continue current dose of Toprol-XL and Entresto.  Add low-dose amlodipine at 2.5 mg daily.  She had ankle edema on higher dose but willing to try amlodipine.  Other option to try 24-hour blood pressure monitor.

## 2022-06-26 ENCOUNTER — Ambulatory Visit: Payer: Medicare PPO | Admitting: Cardiology

## 2022-07-06 DIAGNOSIS — M25562 Pain in left knee: Secondary | ICD-10-CM | POA: Diagnosis not present

## 2022-07-08 ENCOUNTER — Encounter: Payer: Self-pay | Admitting: Hematology

## 2022-07-08 ENCOUNTER — Other Ambulatory Visit: Payer: Self-pay

## 2022-07-08 MED ORDER — ANASTROZOLE 1 MG PO TABS: 1.0000 mg | ORAL_TABLET | Freq: Every day | ORAL | 0 refills | Status: DC

## 2022-07-08 MED ORDER — ENTRESTO 49-51 MG PO TABS: 1.0000 | ORAL_TABLET | Freq: Two times a day (BID) | ORAL | 3 refills | Status: DC

## 2022-07-16 ENCOUNTER — Encounter: Payer: Self-pay | Admitting: Internal Medicine

## 2022-07-16 ENCOUNTER — Ambulatory Visit: Payer: Medicare PPO | Admitting: Internal Medicine

## 2022-07-16 VITALS — BP 130/84 | HR 99 | Temp 98.2°F | Resp 18 | Ht 67.0 in | Wt 208.0 lb

## 2022-07-16 DIAGNOSIS — I10 Essential (primary) hypertension: Secondary | ICD-10-CM | POA: Diagnosis not present

## 2022-07-16 DIAGNOSIS — I480 Paroxysmal atrial fibrillation: Secondary | ICD-10-CM

## 2022-07-16 DIAGNOSIS — E7800 Pure hypercholesterolemia, unspecified: Secondary | ICD-10-CM

## 2022-07-16 DIAGNOSIS — I5032 Chronic diastolic (congestive) heart failure: Secondary | ICD-10-CM

## 2022-07-16 DIAGNOSIS — G4733 Obstructive sleep apnea (adult) (pediatric): Secondary | ICD-10-CM

## 2022-07-16 DIAGNOSIS — R21 Rash and other nonspecific skin eruption: Secondary | ICD-10-CM | POA: Diagnosis not present

## 2022-07-16 MED ORDER — CLINDAMYCIN PHOSPHATE 1 % EX GEL
Freq: Two times a day (BID) | CUTANEOUS | 1 refills | Status: DC
Start: 2022-07-16 — End: 2022-11-29

## 2022-07-16 NOTE — Progress Notes (Unsigned)
Subjective:    Patient ID: Shannon Liu, female    DOB: 1941-01-14, 81 y.o.   MRN: [id number]  DOS:  07/16/2022 Type of visit - description: New patient, to get established, previous PCP retired.  New patient.  Here to get established.  Extensive chart review.  In general feels okay. Good med compliance. She did report some easy bruising but denies blood in the urine or in the stools.  No nosebleeds. Denies chest pain or difficulty breathing. She does feel slightly off balance mostly when she stands up. Uses a CPAP, for the last few days have noted skin irritation on the nose. Review of Systems See above   Past Medical History:  Diagnosis Date   Anxiety    Arthritis    Cancer ([place]) 2023   right breast   Cataract    CHF (congestive heart failure) (HCC)    Dysrhythmia    A fib   Heart murmur    HOH (hard of hearing)    Hyperlipidemia    Hypertension    PAF (paroxysmal atrial fibrillation) ([place])    a. dx on 06/2015 admission. spontaneously converted into NSR. placed on Eliquis   Sleep apnea    CPAP    Past Surgical History:  Procedure Laterality Date   ABDOMINAL HYSTERECTOMY     BREAST CYST EXCISION Left    BREAST LUMPECTOMY WITH RADIOACTIVE SEED LOCALIZATION Right 01/31/2022   Procedure: RIGHT BREAST LUMPECTOMY WITH RADIOACTIVE SEED LOCALIZATION;  Surgeon: Erroll Luna, MD;  Location: [facility];  Service: General;  Laterality: Right;   BREAST SURGERY Left    lumpectomy 35 yrs ago   CARDIOVERSION N/A 12/31/2021   Procedure: CARDIOVERSION;  Surgeon: Donato Heinz, MD;  Location: [facility] ENDOSCOPY;  Service: Cardiovascular;  Laterality: N/A;   DILATION AND CURETTAGE OF UTERUS     NECK SURGERY     discectomy with cadaver bone   TEE WITHOUT CARDIOVERSION N/A 12/31/2021   Procedure: TRANSESOPHAGEAL ECHOCARDIOGRAM (TEE);  Surgeon: Donato Heinz, MD;  Location: [facility] ENDOSCOPY;  Service: Cardiovascular;  Laterality: N/A;   TONSILLECTOMY     removed as a child    TUBAL LIGATION      Current Outpatient Medications  Medication Instructions   acetaminophen (TYLENOL) 500 mg, Oral, Every 6 hours PRN   ALPRAZolam (XANAX) 0.25 mg, Oral, At bedtime PRN   amLODipine (NORVASC) 2.5 mg, Oral, Daily   anastrozole (ARIMIDEX) 1 mg, Oral, Daily   diltiazem (CARDIZEM) 30 MG tablet Take 1 tablet every 4 hours AS NEEDED for AFIB heart rate >100   ELIQUIS 5 MG TABS tablet TAKE ONE TABLET BY MOUTH TWICE DAILY   furosemide (LASIX) 10 mg, Oral, Daily   Lactobacillus (PROBIOTIC ACIDOPHILUS PO) 1 tablet, Oral, Daily   metoprolol succinate (TOPROL-XL) 25 mg, Oral, Daily, Take with or immediately following a meal.   potassium chloride (KLOR-CON) 10 MEQ tablet 10 mEq, Oral, 2 times daily   Propylene Glycol 0.6 % SOLN 1 drop, Both Eyes, 2 times daily, systane eye drops   rosuvastatin (CRESTOR) 10 mg, Oral, Daily   sacubitril-valsartan (ENTRESTO) 49-51 MG 1 tablet, Oral, 2 times daily   Vitamin D 2,000 Units, Oral, Daily       Objective:   Physical Exam BP 130/84   Pulse 99   Temp 98.2 F (36.8 C) (Oral)   Resp 18   Ht '5\' 7"'$  (1.702 m)   Wt 208 lb (94.3 kg)   SpO2 98%   BMI 32.58 kg/m  General:  Well developed, NAD, BMI noted. HEENT:  Normocephalic . Face symmetric, atraumatic Lungs:  CTA B Normal respiratory effort, no intercostal retractions, no accessory muscle use. Heart: Irregularly irregular Lower extremities: no pretibial edema bilaterally  Skin: See picture Neurologic:  alert & oriented X3.  Speech normal, gait appropriate for age and unassisted Psych--  Cognition and judgment appear intact.  Cooperative with normal attention span and concentration.  Behavior appropriate. No anxious or depressed appearing.       Assessment    Assessment (new patient 07-2022, previous PCP Dr. Lavone Orn retired) HTN High cholesterol CV: -- CHF  -- Atrial fibrillation dx ~ 2016 OSA, on CPAP dx 2021 Anxiety (chronic xanax prn) History of R breast  cancer (no chemo, no XRT) DJD  Dermatology: Dr. Fernanda Drum New patient, to get established. HTN: High cholesterol: CHF, A-fib: DJD: Having problems with the left knee, sees Ortho, was told no surgical candidate but will get gel injections.  Request a handicap parking sticker.  Will do.    Rash, nose: She uses a CPAP, wonders if the skin condition of the nose is irritation, on further questioning she had something similar before in the same place and saw dermatologist.  Rosacea?  Plan: Easy  bruising: Recommend observation Get records from previous PCP Seen advise: Had a flu shot, recommend a COVID booster. RTC 3 months.  Tdap 2016 PNM 13: 2015 PNM 20 12/15/2010 S/p Shingrix x2 COVID-vaccine Had a flu shot Colonoscopy Dr. Therisa Doyne, + polyps, no repeat due to age, see pathology report.

## 2022-07-16 NOTE — Patient Instructions (Addendum)
Vaccines I recommend:  Covid booster   Check the  blood pressure regularly BP GOAL is between 110/65 and  135/85. If it is consistently higher or lower, let me know   Apply clindamycin gel to your nose twice daily until better   GO TO [place], [place] back for a checkup in 3 months

## 2022-07-17 ENCOUNTER — Encounter: Payer: Self-pay | Admitting: Internal Medicine

## 2022-07-17 DIAGNOSIS — Z09 Encounter for follow-up examination after completed treatment for conditions other than malignant neoplasm: Secondary | ICD-10-CM | POA: Insufficient documentation

## 2022-07-17 NOTE — Assessment & Plan Note (Signed)
Preventive care reviewed: Tdap 2016 PNM 13: 2015 PNM 20 12/15/2010 S/p Shingrix x2 Colonoscopy Dr. Therisa Doyne, 2020, + polyps, no repeat due to age, see pathology report.

## 2022-07-17 NOTE — Assessment & Plan Note (Signed)
New patient, to get established.  Extensive chart review. Prediabetes: will get most recent labs HTN: BP today is very good, continue amlodipine, Cardizem, Lasix, metoprolol, Entresto and potassium. High cholesterol: Currently on Crestor. CHF, A-fib: Anticoagulated, follow-up by cardiology, recently seen by electrophysiology, scheduled for a ablation.Recent labs: 06/13/2022: Potassium 5.2, creatinine normal. DJD: Having problems with the left knee, sees Ortho, was told no surgical candidate but will get gel injections.  Request a handicap parking sticker.  Will do. Rash, nose: She uses a CPAP, wonders if the skin condition of the nose is irritation, on further questioning she had something similar before in the same place and saw dermatologist.  Rosacea?  Plan: Trial with clindamycin. Easy  bruising: Reports easy bruising, no major bleeding, recommend observation Get records from previous PCP Preventive care reviewed: Had a flu shot, recommend a COVID booster. RTC 3 months.

## 2022-07-19 ENCOUNTER — Ambulatory Visit: Payer: Medicare PPO | Admitting: Cardiology

## 2022-07-22 DIAGNOSIS — M1712 Unilateral primary osteoarthritis, left knee: Secondary | ICD-10-CM | POA: Diagnosis not present

## 2022-07-29 DIAGNOSIS — M1712 Unilateral primary osteoarthritis, left knee: Secondary | ICD-10-CM | POA: Diagnosis not present

## 2022-08-05 DIAGNOSIS — M1712 Unilateral primary osteoarthritis, left knee: Secondary | ICD-10-CM | POA: Diagnosis not present

## 2022-08-08 ENCOUNTER — Other Ambulatory Visit: Payer: Self-pay | Admitting: Hematology

## 2022-08-15 ENCOUNTER — Ambulatory Visit (INDEPENDENT_AMBULATORY_CARE_PROVIDER_SITE_OTHER): Payer: Medicare PPO

## 2022-08-15 ENCOUNTER — Other Ambulatory Visit: Payer: Self-pay

## 2022-08-15 ENCOUNTER — Telehealth: Payer: Self-pay | Admitting: Internal Medicine

## 2022-08-15 VITALS — Wt 204.0 lb

## 2022-08-15 DIAGNOSIS — Z0000 Encounter for general adult medical examination without abnormal findings: Secondary | ICD-10-CM

## 2022-08-15 DIAGNOSIS — C50411 Malignant neoplasm of upper-outer quadrant of right female breast: Secondary | ICD-10-CM

## 2022-08-15 NOTE — Telephone Encounter (Signed)
Copied from [place] [id number]. Topic: Medicare AWV >> Aug 15, 2022  9:36 AM Gillis Santa wrote: Reason for CRM: LVM PATIENT TO CALL [phone] TO SCHEDULE AWVI WITH HEALTH COACH

## 2022-08-15 NOTE — Progress Notes (Addendum)
I connected with  Forbes Cellar on 08/15/22 by a audio enabled telemedicine application and verified that I am speaking with the correct person using two identifiers.  Patient Location: Home  Provider Location: Office/Clinic  I discussed the limitations of evaluation and management by telemedicine. The patient expressed understanding and agreed to proceed.   Subjective:   SHATORA WEATHERBEE is a 81 y.o. female who presents for an Initial Medicare Annual Wellness Visit.  Review of Systems     Cardiac Risk Factors include: advanced age (>36mn, >>24women)     Objective:    Today's Vitals   08/15/22 1146  Weight: 204 lb (92.5 kg)   Body mass index is 31.95 kg/m.     08/15/2022   11:51 AM 03/26/2022   10:53 AM 01/31/2022    7:42 AM 01/30/2022   11:16 AM 12/31/2021   10:33 AM 12/26/2021    5:20 PM 12/26/2021   11:46 AM  Advanced Directives  Does Patient Have a Medical Advance Directive? Yes No Yes Yes Yes Yes Yes  Type of AParamedicof [place]amooseLiving will  H[place] ValleyLiving will HBrookingsLiving will HVonaLiving will HMantachieLiving will HBranfordLiving will  Does patient want to make changes to medical advance directive? No - Patient declined   No - Patient declined  No - Patient declined No - Patient declined  Copy of [place]ins [place] Chart? Yes - validated most recent copy scanned in chart (See row information)  No - copy requested No - copy requested No - copy requested  No - copy requested  Would patient like information on creating a medical advance directive?       No - Patient declined    Current Medications (verified) Outpatient Encounter Medications as of 08/15/2022  Medication Sig   acetaminophen (TYLENOL) 500 MG tablet Take 500 mg by mouth every 6 (six) hours as needed for mild pain.   ALPRAZolam (XANAX) 0.25 MG tablet Take 0.25 mg by  mouth at bedtime as needed for anxiety.   amLODipine (NORVASC) 2.5 MG tablet Take 1 tablet (2.5 mg total) by mouth daily.   anastrozole (ARIMIDEX) 1 MG tablet Take 1 tablet (1 mg total) by mouth daily.   Cholecalciferol (VITAMIN D) 50 MCG (2000 UT) CAPS Take 2,000 Units by mouth daily.   clindamycin (CLINDAGEL) 1 % gel Apply topically 2 (two) times daily.   diltiazem (CARDIZEM) 30 MG tablet Take 1 tablet every 4 hours AS NEEDED for AFIB heart rate >100   ELIQUIS 5 MG TABS tablet TAKE ONE TABLET BY MOUTH TWICE DAILY   furosemide (LASIX) 20 MG tablet Take 0.5 tablets (10 mg total) by mouth daily.   Lactobacillus (PROBIOTIC ACIDOPHILUS PO) Take 1 tablet by mouth daily.   metoprolol succinate (TOPROL-XL) 25 MG 24 hr tablet Take 1 tablet (25 mg total) by mouth daily. Take with or immediately following a meal.   potassium chloride (KLOR-CON) 10 MEQ tablet Take 1 tablet (10 mEq total) by mouth 2 (two) times daily.   Propylene Glycol 0.6 % SOLN Place 1 drop into both eyes 2 (two) times daily. systane eye drops   rosuvastatin (CRESTOR) 10 MG tablet Take 10 mg by mouth daily.   sacubitril-valsartan (ENTRESTO) 49-51 MG Take 1 tablet by mouth 2 (two) times daily.   No facility-administered encounter medications on file as of 08/15/2022.    Allergies (verified) Amlodipine besylate, Buspirone, Fluoxetine, Morphine  and related, Penicillins, and Sertraline hcl   History: Past Medical History:  Diagnosis Date   Anxiety    Arthritis    Cancer ([place]) 2023   right breast   Cataract    CHF (congestive heart failure) ([place])    Dysrhythmia    A fib   Heart murmur    HOH (hard of hearing)    Hyperlipidemia    Hypertension    PAF (paroxysmal atrial fibrillation) ([place])    a. dx on 06/2015 admission. spontaneously converted into NSR. placed on Eliquis   Sleep apnea    CPAP   Past Surgical History:  Procedure Laterality Date   ABDOMINAL HYSTERECTOMY     BREAST CYST EXCISION Left    BREAST LUMPECTOMY  WITH RADIOACTIVE SEED LOCALIZATION Right 01/31/2022   Procedure: RIGHT BREAST LUMPECTOMY WITH RADIOACTIVE SEED LOCALIZATION;  Surgeon: Erroll Luna, MD;  Location: [facility];  Service: General;  Laterality: Right;   BREAST SURGERY Left    lumpectomy 35 yrs ago   CARDIOVERSION N/A 12/31/2021   Procedure: CARDIOVERSION;  Surgeon: Donato Heinz, MD;  Location[facility] ENDOSCOPY;  Service: Cardiovascular;  Laterality: N/A;   DILATION AND CURETTAGE OF UTERUS     NECK SURGERY     discectomy with cadaver bone   TEE WITHOUT CARDIOVERSION N/A 12/31/2021   Procedure: TRANSESOPHAGEAL ECHOCARDIOGRAM (TEE);  Surgeon: Donato Heinz, MD;  Location: [facility] ENDOSCOPY;  Service: Cardiovascular;  Laterality: N/A;   TONSILLECTOMY     removed as a child   TUBAL LIGATION     Family History  Problem Relation Age of Onset   Heart attack Mother 73   CVA Mother 13   Unexplained death Father 4   Hypertension Sister    Hypertension Brother    Colon cancer Maternal Grandfather    Prostate cancer Son 15   Breast cancer Neg Hx    Social History   Socioeconomic History   Marital status: Widowed    Spouse name: Not on file   Number of children: 2   Years of education: Not on file   Highest education level: Not on file  Occupational History   Occupation: Retired- [place]  Tobacco Use   Smoking status: Never   Smokeless tobacco: Never  Vaping Use   Vaping Use: Never used  Substance and Sexual Activity   Alcohol use: No    Alcohol/week: 0.0 standard drinks of alcohol   Drug use: No   Sexual activity: Not Currently  Other Topics Concern   Not on file  [place], near [place].    Daughter currently lives with her.   Social Determinants of Health   Financial Resource Strain: Low Risk  (08/15/2022)   Overall Financial Resource Strain (CARDIA)    Difficulty of Paying Living Expenses: Not hard at all  Food Insecurity: No  Food Insecurity (08/15/2022)   Hunger Vital Sign    Worried About Running Out of Food in the Last Year: Never true    Ran Out of Food in the Last Year: Never true  Transportation Needs: No Transportation Needs (08/15/2022)   PRAPARE - Hydrologist (Medical): No    Lack of Transportation (Non-Medical): No  Physical Activity: Insufficiently Active (08/15/2022)   Exercise Vital Sign    Days of Exercise per Week: 2 days    Minutes of Exercise per Session: 60 min  Stress: No Stress Concern Present (08/15/2022)  [employer] - Occupational Stress Questionnaire    Feeling of Stress : Not at all  Social Connections: Moderately Isolated (08/15/2022)   Social Connection and Isolation Panel [NHANES]    Frequency of Communication with Friends and Family: More than three times a week    Frequency of Social Gatherings with Friends and Family: More than three times a week    Attends Religious Services: More than 4 times per year    Active Member of [employer] or Organizations: No    Attends Archivist Meetings: Never    Marital Status: Widowed    Tobacco Counseling Counseling given: Not Answered   Clinical Intake:  Pre-visit preparation completed: Yes  Pain : No/denies pain     BMI - recorded: 31.95 Nutritional Status: BMI > 30  Obese Nutritional Risks: None Diabetes: No  How often do you need to have someone help you when you read instructions, pamphlets, or other written materials from your doctor or pharmacy?: 1 - Never  Diabetic?no  Interpreter Needed?: No  Information entered by :: Charlott Rakes, LPN   Activities of Daily Living    08/15/2022   11:52 AM 01/30/2022   11:19 AM  In your present state of health, do you have any difficulty performing the following activities:  Hearing? 0   Vision? 0   Difficulty concentrating or making decisions? 0   Walking or climbing stairs? 1   Comment avoid stairs without rails    Dressing or bathing? 0   Doing errands, shopping? 0 0  Preparing Food and eating ? N   Using the Toilet? N   In the past six months, have you accidently leaked urine? N   Do you have problems with loss of bowel control? N   Managing your Medications? N   Managing your Finances? N   Housekeeping or managing your Housekeeping? N     Patient Care Team: Colon Branch, MD as PCP - General (Internal Medicine) Jerline Pain, MD as PCP - Cardiology (Cardiology) Constance Haw, MD as PCP - Electrophysiology (Cardiology) Erroll Luna, MD as Consulting Physician (General Surgery) Truitt Merle, MD as Consulting Physician (Hematology) Kyung Rudd, MD as Consulting Physician (Radiation Oncology) Ronnette Juniper, MD as Consulting Physician (Gastroenterology)  Indicate any recent Medical Services you may have received from other than [facility] providers in the past year (date may be approximate).     Assessment:   This is a routine wellness examination for Patrisha.  Hearing/Vision screen Hearing Screening - Comments:: Pt wears hearing aids  Vision Screening - Comments:: Pt follows up with Dr Luberta Mutter for annual eye exams   Dietary issues and exercise activities discussed: Current Exercise Habits: Home exercise routine, Type of exercise: Other - see comments (exercise class), Time (Minutes): 60, Frequency (Times/Week): 2, Weekly Exercise (Minutes/Week): 120   Goals Addressed             This Visit's Progress    Patient Stated       Maintain health and activity        Depression Screen    08/15/2022   11:49 AM 07/16/2022    2:09 PM  PHQ 2/9 Scores  PHQ - 2 Score 0 0    Fall Risk    08/15/2022   11:52 AM 07/16/2022    2:09 PM  [place] in the past year? 1 0  Number falls in past yr: 1 0  Injury with Fall? 1 0  Comment bruised legs   Risk for fall due to : Impaired vision;Impaired balance/gait   Follow up Falls prevention discussed Falls evaluation completed     FALL RISK PREVENTION PERTAINING TO THE HOME:  Any stairs in or around the home? Yes  If so, are there any without handrails? No  Home free of loose throw rugs in walkways, pet beds, electrical cords, etc? Yes  Adequate lighting in your home to reduce risk of falls? Yes   ASSISTIVE DEVICES UTILIZED TO PREVENT FALLS:  Life alert? No  Use of a cane, walker or w/c? No  Grab bars in the bathroom? Yes  Shower chair or bench in shower? Yes  Elevated toilet seat or a handicapped toilet? Yes   TIMED UP AND GO:  Was the test performed? No .   Cognitive Function:        08/15/2022   11:54 AM  6CIT Screen  What Year? 0 points  What month? 0 points  What time? 0 points  Count back from 20 0 points  Months in reverse 0 points  Repeat phrase 6 points  Total Score 6 points    Immunizations Immunization History  Administered Date(s) Administered   Influenza, High Dose Seasonal PF 06/23/2020, 06/28/2021   Influenza,inj,Quad PF,6+ Mos 06/24/2015   Influenza-Unspecified 07/04/2022   PFIZER(Purple Top)SARS-COV-2 Vaccination 10/26/2019, 11/14/2019, 07/15/2020   Pfizer Covid-19 Vaccine Bivalent Booster 52yr & up 07/23/2021   Pneumococcal Conjugate-13 05/06/2014   Pneumococcal Polysaccharide-23 12/05/2006, 07/02/2011   Td 11/14/2004   Tdap 11/08/2014   Zoster Recombinat (Shingrix) 11/16/2018, 06/19/2019   Zoster, Live 05/01/2010    TDAP status: Up to date  Flu Vaccine status: Up to date  Pneumococcal vaccine status: Up to date  Covid-19 vaccine status: Completed vaccines  Qualifies for Shingles Vaccine? Yes   Zostavax completed Yes   Shingrix Completed?: Yes  Screening Tests Health Maintenance  Topic Date Due   COVID-19 Vaccine (5 - Pfizer risk series) 09/17/2021   MAMMOGRAM  11/05/2022   Medicare Annual Wellness (AWV)  08/16/2023   TETANUS/TDAP  11/08/2024   Pneumonia Vaccine 81 Years old  Completed   INFLUENZA VACCINE  Completed   DEXA SCAN  Completed    Zoster Vaccines- Shingrix  Completed   HPV VACCINES  Aged Out    Health Maintenance  Health Maintenance Due  Topic Date Due   COVID-19 Vaccine (5 - Pfizer risk series) 09/17/2021    Colorectal cancer screening: No longer required. Per pt   Mammogram status: Completed 11/05/21. Repeat every year  Bone Density status: Completed 05/30/22. Results reflect: Bone density results: NORMAL. Repeat every 2 years.  Additional Screening:    Vision Screening: Recommended annual ophthalmology exams for early detection of glaucoma and other disorders of the eye. Is the patient up to date with their annual eye exam?  Yes  Who is the provider or what is the name of the office in which the patient attends annual eye exams? Dr MEllie Lunch If pt is not established with a provider, would they like to be referred to a provider to establish care? No .   Dental Screening: Recommended annual dental exams for proper oral hygiene  Community Resource Referral / Chronic Care Management: CRR required this visit?  No   CCM required this visit?  No      Plan:     I have personally reviewed and noted the following in the patient's chart:   Medical and social history Use of alcohol, tobacco or illicit  drugs  Current medications and supplements including opioid prescriptions. Patient is not currently taking opioid prescriptions. Functional ability and status Nutritional status Physical activity Advanced directives List of other physicians Hospitalizations, surgeries, and ER visits in previous 12 months Vitals Screenings to include cognitive, depression, and falls Referrals and appointments  In addition, I have reviewed and discussed with patient certain preventive protocols, quality metrics, and best practice recommendations. A written personalized care plan for preventive services as well as general preventive health recommendations were provided to patient.     Willette Brace, LPN   [id number]    Nurse Notes: none

## 2022-08-15 NOTE — Patient Instructions (Signed)
[name] , Thank you for taking time to come for your Medicare Wellness Visit. I appreciate your ongoing commitment to your health goals. Please review the following plan we discussed and let me know if I can assist you in the future.   These are the goals we discussed:  Goals      Patient Stated     Maintain health and activity         This is a list of the screening recommended for you and due dates:  Health Maintenance  Topic Date Due   COVID-19 Vaccine (5 - Pfizer risk series) 09/17/2021   Mammogram  11/05/2022   Medicare Annual Wellness Visit  08/16/2023   Tetanus Vaccine  11/08/2024   Pneumonia Vaccine  Completed   Flu Shot  Completed   DEXA scan (bone density measurement)  Completed   Zoster (Shingles) Vaccine  Completed   HPV Vaccine  Aged Out    Advanced directives: copies in chart   Conditions/risks identified: stay active and healthy   Next appointment: Follow up in one year for your annual wellness visit    Preventive Care 65 Years and Older, Female Preventive care refers to lifestyle choices and visits with your health care provider that can promote health and wellness. What does preventive care include? A yearly physical exam. This is also called an annual well check. Dental exams once or twice a year. Routine eye exams. Ask your health care provider how often you should have your eyes checked. Personal lifestyle choices, including: Daily care of your teeth and gums. Regular physical activity. Eating a healthy diet. Avoiding tobacco and drug use. Limiting alcohol use. Practicing safe sex. Taking low-dose aspirin every day. Taking vitamin and mineral supplements as recommended by your health care provider. What happens during an annual well check? The services and screenings done by your health care provider during your annual well check will depend on your age, overall health, lifestyle risk factors, and family history of disease. Counseling  Your  health care provider may ask you questions about your: Alcohol use. Tobacco use. Drug use. Emotional well-being. Home and relationship well-being. Sexual activity. Eating habits. History of falls. Memory and ability to understand (cognition). Work and work Statistician. Reproductive health. Screening  You may have the following tests or measurements: Height, weight, and BMI. Blood pressure. Lipid and cholesterol levels. These may be checked every 5 years, or more frequently if you are over 52 years old. Skin check. Lung cancer screening. You may have this screening every year starting at age 50 if you have a 30-pack-year history of smoking and currently smoke or have quit within the past 15 years. Fecal occult blood test (FOBT) of the stool. You may have this test every year starting at age 37. Flexible sigmoidoscopy or colonoscopy. You may have a sigmoidoscopy every 5 years or a colonoscopy every 10 years starting at age 106. Hepatitis C blood test. Hepatitis B blood test. Sexually transmitted disease (STD) testing. Diabetes screening. This is done by checking your blood sugar (glucose) after you have not eaten for a while (fasting). You may have this done every 1-3 years. Bone density scan. This is done to screen for osteoporosis. You may have this done starting at age 44. Mammogram. This may be done every 1-2 years. Talk to your health care provider about how often you should have regular mammograms. Talk with your health care provider about your test results, treatment options, and if necessary, the need for more  tests. Vaccines  Your health care provider may recommend certain vaccines, such as: Influenza vaccine. This is recommended every year. Tetanus, diphtheria, and acellular pertussis (Tdap, Td) vaccine. You may need a Td booster every 10 years. Zoster vaccine. You may need this after age 36. Pneumococcal 13-valent conjugate (PCV13) vaccine. One dose is recommended after age  73. Pneumococcal polysaccharide (PPSV23) vaccine. One dose is recommended after age 90. Talk to your health care provider about which screenings and vaccines you need and how often you need them. This information is not intended to replace advice given to you by your health care provider. Make sure you discuss any questions you have with your health care provider. Document Released: 10/27/2015 Document Revised: 06/19/2016 Document Reviewed: 08/01/2015 Elsevier Interactive Patient Education  2017 [place] Prevention in the Home Falls can cause injuries. They can happen to people of all ages. There are many things you can do to make your home safe and to help prevent falls. What can I do on the outside of my home? Regularly fix the edges of walkways and driveways and fix any cracks. Remove anything that might make you trip as you walk through a door, such as a raised step or threshold. Trim any bushes or trees on the path to your home. Use bright outdoor lighting. Clear any walking paths of anything that might make someone trip, such as rocks or tools. Regularly check to see if handrails are loose or broken. Make sure that both sides of any steps have handrails. Any raised decks and porches should have guardrails on the edges. Have any leaves, snow, or ice cleared regularly. Use sand or salt on walking paths during winter. Clean up any spills in your garage right away. This includes oil or grease spills. What can I do in the bathroom? Use night lights. Install grab bars by the toilet and in the tub and shower. Do not use towel bars as grab bars. Use non-skid mats or decals in the tub or shower. If you need to sit down in the shower, use a plastic, non-slip stool. Keep the floor dry. Clean up any water that spills on the floor as soon as it happens. Remove soap buildup in the tub or shower regularly. Attach bath mats securely with double-sided non-slip rug tape. Do not have throw  rugs and other things on the floor that can make you trip. What can I do in the bedroom? Use night lights. Make sure that you have a light by your bed that is easy to reach. Do not use any sheets or blankets that are too big for your bed. They should not hang down onto the floor. Have a firm chair that has side arms. You can use this for support while you get dressed. Do not have throw rugs and other things on the floor that can make you trip. What can I do in the kitchen? Clean up any spills right away. Avoid walking on wet floors. Keep items that you use a lot in easy-to-reach places. If you need to reach something above you, use a strong step stool that has a grab bar. Keep electrical cords out of the way. Do not use floor polish or wax that makes floors slippery. If you must use wax, use non-skid floor wax. Do not have throw rugs and other things on the floor that can make you trip. What can I do with my stairs? Do not leave any items on the stairs. Make sure  that there are handrails on both sides of the stairs and use them. Fix handrails that are broken or loose. Make sure that handrails are as long as the stairways. Check any carpeting to make sure that it is firmly attached to the stairs. Fix any carpet that is loose or worn. Avoid having throw rugs at the top or bottom of the stairs. If you do have throw rugs, attach them to the floor with carpet tape. Make sure that you have a light switch at the top of the stairs and the bottom of the stairs. If you do not have them, ask someone to add them for you. What else can I do to help prevent falls? Wear shoes that: Do not have high heels. Have rubber bottoms. Are comfortable and fit you well. Are closed at the toe. Do not wear sandals. If you use a stepladder: Make sure that it is fully opened. Do not climb a closed stepladder. Make sure that both sides of the stepladder are locked into place. Ask someone to hold it for you, if  possible. Clearly mark and make sure that you can see: Any grab bars or handrails. First and last steps. Where the edge of each step is. Use tools that help you move around (mobility aids) if they are needed. These include: Canes. Walkers. Scooters. Crutches. Turn on the lights when you go into a dark area. Replace any light bulbs as soon as they burn out. Set up your furniture so you have a clear path. Avoid moving your furniture around. If any of your floors are uneven, fix them. If there are any pets around you, be aware of where they are. Review your medicines with your doctor. Some medicines can make you feel dizzy. This can increase your chance of falling. Ask your doctor what other things that you can do to help prevent falls. This information is not intended to replace advice given to you by your health care provider. Make sure you discuss any questions you have with your health care provider. Document Released: 07/27/2009 Document Revised: 03/07/2016 Document Reviewed: 11/04/2014 Elsevier Interactive Patient Education  2017 [employer].

## 2022-08-16 ENCOUNTER — Encounter: Payer: Self-pay | Admitting: Internal Medicine

## 2022-08-16 ENCOUNTER — Encounter: Payer: Self-pay | Admitting: Hematology

## 2022-08-16 ENCOUNTER — Inpatient Hospital Stay: Payer: Medicare PPO | Attending: Hematology

## 2022-08-16 ENCOUNTER — Inpatient Hospital Stay: Payer: Medicare PPO | Admitting: Hematology

## 2022-08-16 VITALS — BP 146/72 | HR 63 | Temp 98.1°F | Resp 14 | Ht 67.0 in | Wt 208.4 lb

## 2022-08-16 DIAGNOSIS — Z79811 Long term (current) use of aromatase inhibitors: Secondary | ICD-10-CM | POA: Insufficient documentation

## 2022-08-16 DIAGNOSIS — C50411 Malignant neoplasm of upper-outer quadrant of right female breast: Secondary | ICD-10-CM | POA: Insufficient documentation

## 2022-08-16 DIAGNOSIS — Z170 Estrogen receptor positive status [ER+]: Secondary | ICD-10-CM

## 2022-08-16 LAB — CMP (CANCER CENTER ONLY)
ALT: 12 U/L (ref 0–44)
AST: 18 U/L (ref 15–41)
Albumin: 4.1 g/dL (ref 3.5–5.0)
Alkaline Phosphatase: 81 U/L (ref 38–126)
Anion gap: 5 (ref 5–15)
BUN: 18 mg/dL (ref 8–23)
CO2: 29 mmol/L (ref 22–32)
Calcium: 9.5 mg/dL (ref 8.9–10.3)
Chloride: 107 mmol/L (ref 98–111)
Creatinine: 0.74 mg/dL (ref 0.44–1.00)
GFR, Estimated: >60 mL/min (ref >60)
Glucose, Bld: 100 mg/dL — ABNORMAL HIGH (ref 70–99)
Potassium: 4.6 mmol/L (ref 3.5–5.1)
Sodium: 141 mmol/L (ref 135–145)
Total Bilirubin: 0.7 mg/dL (ref 0.3–1.2)
Total Protein: 6.5 g/dL (ref 6.5–8.1)

## 2022-08-16 LAB — CBC WITH DIFFERENTIAL (CANCER CENTER ONLY)
Abs Immature Granulocytes: 0.02 K/uL (ref 0.00–0.07)
Basophils Absolute: 0.1 K/uL (ref 0.0–0.1)
Basophils Relative: 1 %
Eosinophils Absolute: 0.1 K/uL (ref 0.0–0.5)
Eosinophils Relative: 2 %
HCT: 40.9 % (ref 36.0–46.0)
Hemoglobin: 14 g/dL (ref 12.0–15.0)
Immature Granulocytes: 0 %
Lymphocytes Relative: 28 %
Lymphs Abs: 1.9 K/uL (ref 0.7–4.0)
MCH: 31.2 pg (ref 26.0–34.0)
MCHC: 34.2 g/dL (ref 30.0–36.0)
MCV: 91.1 fL (ref 80.0–100.0)
Monocytes Absolute: 0.7 K/uL (ref 0.1–1.0)
Monocytes Relative: 11 %
Neutro Abs: 3.9 K/uL (ref 1.7–7.7)
Neutrophils Relative %: 58 %
Platelet Count: 215 K/uL (ref 150–400)
RBC: 4.49 MIL/uL (ref 3.87–5.11)
RDW: 13.2 % (ref 11.5–15.5)
WBC Count: 6.8 K/uL (ref 4.0–10.5)
nRBC: 0 % (ref 0.0–0.2)

## 2022-08-16 NOTE — Progress Notes (Addendum)
[facility]   Telephone:([phone]) [phone] Fax:([phone]) [phone]   Clinic Follow up Note   Patient Care Team: Colon Branch, MD as PCP - General (Internal Medicine) Jerline Pain, MD as PCP - Cardiology (Cardiology) Constance Haw, MD as PCP - Electrophysiology (Cardiology) Erroll Luna, MD as Consulting Physician (General Surgery) Truitt Merle, MD as Consulting Physician (Hematology) Kyung Rudd, MD as Consulting Physician (Radiation Oncology) Ronnette Juniper, MD as Consulting Physician (Gastroenterology)  Date of Service:  08/16/2022  CHIEF COMPLAINT: f/u of right breast cancer  CURRENT THERAPY:  Antiestrogen therapy, Tamoxifen started 02/2022, currently anastrozole since 06/2022  ASSESSMENT & PLAN:  Shannon Liu is a 81 y.o. post-hysterectomy female with   1. Malignant neoplasm of upper-outer quadrant of right breast, invasive ductal carcinoma with lobular features, stage IA, pT1c, cN0, ER+/PR+/HER2-, Grade 2  -found on screening mammogram. S/p right lumpectomy on 01/31/22 with Dr. Brantley Stage, path showed 1.5 cm IDC with lobular features and DCIS, negative margins. -given her early stage disease and advanced age, we opted to forego adjuvant radiation.  -she started tamoxifen in 02/2022, discontinued due to increased anxiety. Switched to anastrozole 06/2022, tolerating better with some increased joint pain. We discussed the chance of recurrence without antiestrogen therapy. I explained given her early stage and grade 2 disease, the risk of distant recurrence is probably less than 10%. We agreed to try holding the anastrozole to see if her joint pain improves. If it doesn't get better, I advised her to restart. -I also discussed the option of exemestane, which overall has less arthralgia than anastrozole.  She is open to that if needed. -she is clinically doing well from a breast cancer standpoint. Labs reviewed, overall WNL. Physical exam was unremarkable. There is no clinical  concern for recurrence. -continue surveillance, annual mammogram scheduled for 11/08/22.  2. Bone Health -recent DEXA 05/30/22 was normal, with T-score of -0.4.    PLAN: -hold anastrozole for 1-2 months, can restart if no change in joint pain. If improves, I may change anastrozole to exemestane if she is willing to try. -lab and f/u in 6 months   No problem-specific Assessment & Plan notes found for this encounter.   SUMMARY OF ONCOLOGIC HISTORY: Oncology History Overview Note   Cancer Staging  Malignant neoplasm of upper-outer quadrant of right breast in female, estrogen receptor positive ([place]) Staging form: Breast, AJCC 8th Edition - Clinical stage from 12/06/2021: Stage IA (cT1b, cN0, cM0, G2, ER+, PR+, HER2-) - Signed by Truitt Merle, MD on 12/11/2021 - Pathologic stage from 01/31/2022: Stage Unknown (pT1c, pNX, cM0, G2, ER+, PR+, HER2-) - Signed by Truitt Merle, MD on 02/12/2022     Malignant neoplasm of upper-outer quadrant of right breast in female, estrogen receptor positive ([place])  11/28/2021 Mammogram   CLINICAL DATA:  Patient returns today to evaluate possible RIGHT breast asymmetries identified on recent screening mammogram.   EXAM: DIGITAL DIAGNOSTIC UNILATERAL RIGHT MAMMOGRAM WITH TOMOSYNTHESIS AND CAD; ULTRASOUND RIGHT BREAST LIMITED  IMPRESSION: 1. Irregular hypoechoic mass in the RIGHT breast at the 10 o'clock axis, 7 cm from the nipple, measuring 9 mm, with associated architectural distortion, corresponding to the mammographic finding. This is a highly suspicious finding for which ultrasound-guided core biopsy is recommended. 2. Additional benign findings within the RIGHT breast, as detailed above. 3. No enlarged or morphologically abnormal lymph nodes in the RIGHT axilla.   12/06/2021 Cancer Staging   Staging form: Breast, AJCC 8th Edition - Clinical stage from 12/06/2021: Stage IA (cT1b,  cN0, cM0, G2, ER+, PR+, HER2-) - Signed by Truitt Merle, MD on 12/11/2021 Stage prefix:  Initial diagnosis Histologic grading system: 3 grade system   12/06/2021 Initial Biopsy   Diagnosis Breast, right, needle core biopsy, 10 o'clock, 7cmfn INVASIVE DUCTAL CARCINOMA WITH LOBULAR FEATURES, GRADE 2 (3+2+1)  An E-cadherin immunohistochemical stains performed with adequate control. This stain is diffusely negative within the tumor. Histologically the tumor forms ducts and is present in cribriform sheets as well as focally present in cords and small solid nests. Given this histology it is felt the negative E-cadherin stain represents aberrant absence of expression within an otherwise ductal carcinoma. This lack of E-cadherin expression may explain the focal lobular features.  PROGNOSTIC INDICATORS Results: The tumor cells are EQUIVOCAL for Her2 (2+). Her2 by FISH will be performed and the results reported separately. Estrogen Receptor: 95%, POSITIVE, STRONG STAINING INTENSITY Progesterone Receptor: 80%, POSITIVE, STRONG STAINING INTENSITY Proliferation Marker Ki67: 2%   12/10/2021 Initial Diagnosis   Malignant neoplasm of upper-outer quadrant of right breast in female, estrogen receptor positive ([place])    Genetic Testing   [facility] CancerNext-Expanded Panel was Negative. Of note, a variant of uncertain significance was detected in the BRCA2 gene (p.P606L). Report date is 12/24/2021.  The CancerNext-Expanded gene panel offered by [facility] and includes sequencing, rearrangement, and RNA analysis for the following 77 genes: AIP, ALK, APC, ATM, AXIN2, BAP1, BARD1, BLM, BMPR1A, BRCA1, BRCA2, BRIP1, CDC73, CDH1, CDK4, CDKN1B, CDKN2A, CHEK2, CTNNA1, DICER1, FANCC, FH, FLCN, GALNT12, KIF1B, LZTR1, MAX, MEN1, MET, MLH1, MSH2, MSH3, MSH6, MUTYH, NBN, NF1, NF2, NTHL1, PALB2, PHOX2B, PMS2, POT1, PRKAR1A, PTCH1, PTEN, RAD51C, RAD51D, RB1, RECQL, RET, SDHA, SDHAF2, SDHB, SDHC, SDHD, SMAD4, SMARCA4, SMARCB1, SMARCE1, STK11, SUFU, TMEM127, TP53, TSC1, TSC2, VHL and XRCC2 (sequencing and  deletion/duplication); EGFR, EGLN1, HOXB13, KIT, MITF, PDGFRA, POLD1, and POLE (sequencing only); EPCAM and GREM1 (deletion/duplication only).    01/31/2022 Cancer Staging   Staging form: Breast, AJCC 8th Edition - Pathologic stage from 01/31/2022: Stage Unknown (pT1c, pNX, cM0, G2, ER+, PR+, HER2-) - Signed by Truitt Merle, MD on 02/12/2022 Stage prefix: Initial diagnosis Histologic grading system: 3 grade system Residual tumor (R): R0 - None   01/31/2022 Definitive Surgery   FINAL MICROSCOPIC DIAGNOSIS:   A. BREAST, RIGHT, LUMPECTOMY:  -  Invasive ductal carcinoma with lobular features, Nottingham grade 2 of 3, 1.5 cm  -  Ductal carcinoma in-situ, intermediate grade  -  Margins uninvolved by carcinoma (<0.1 cm; anterior margin)  -  Previous biopsy site changes present  -  Adenosis and fibrocystic changes  -  See oncology table and comment below   B. BREAST, RIGHT ADDITIONAL ANTERIOMEDIAL MARGIN, EXCISION:  -  Usual ductal hyperplasia  -  No residual carcinoma identified  -  See comment   C. BREAST, RIGHT ADDITIONAL ANTERIOPOSTERIOR MARGIN, EXCISION:  -  No residual carcinoma identified   D. BREAST, RIGHT ADDITIONAL LATEROINFERIOR MARGIN, EXCISION:  -  Focal residual ductal carcinoma in situ  -  Fibrocystic changes with metaplasia  -  Margins uninvolved by carcinoma (0.1 cm; inferior margin)  -  See comment    03/2022 -  Anti-estrogen oral therapy   Tamoxifen x 5 years      INTERVAL HISTORY:  Shannon Liu is here for a follow up of breast cancer. She was last seen by me on 02/13/22. She presents to the clinic accompanied by her daughter. She tells me she is unsure if the anxiety was related to tamoxifen for sure, as  she was changing several different medications. She still feels she is doing better with anastrozole. She does, however, report an increase in joint pain in her lower body.   All other systems were reviewed with the patient and are negative.  MEDICAL HISTORY:  Past  Medical History:  Diagnosis Date   Anxiety    Arthritis    Cancer ([place]) 2023   right breast   Cataract    CHF (congestive heart failure) ([place])    Dysrhythmia    A fib   Heart murmur    HOH (hard of hearing)    Hyperlipidemia    Hypertension    PAF (paroxysmal atrial fibrillation) ([place])    a. dx on 06/2015 admission. spontaneously converted into NSR. placed on Eliquis   Sleep apnea    CPAP    SURGICAL HISTORY: Past Surgical History:  Procedure Laterality Date   ABDOMINAL HYSTERECTOMY     BREAST CYST EXCISION Left    BREAST LUMPECTOMY WITH RADIOACTIVE SEED LOCALIZATION Right 01/31/2022   Procedure: RIGHT BREAST LUMPECTOMY WITH RADIOACTIVE SEED LOCALIZATION;  Surgeon: Erroll Luna, MD;  Location: [facility];  Service: General;  Laterality: Right;   BREAST SURGERY Left    lumpectomy 35 yrs ago   CARDIOVERSION N/A 12/31/2021   Procedure: CARDIOVERSION;  Surgeon: Donato Heinz, MD;  Location: [facility] ENDOSCOPY;  Service: Cardiovascular;  Laterality: N/A;   DILATION AND CURETTAGE OF UTERUS     NECK SURGERY     discectomy with cadaver bone   TEE WITHOUT CARDIOVERSION N/A 12/31/2021   Procedure: TRANSESOPHAGEAL ECHOCARDIOGRAM (TEE);  Surgeon: Donato Heinz, MD;  Location: [facility] ENDOSCOPY;  Service: Cardiovascular;  Laterality: N/A;   TONSILLECTOMY     removed as a child   TUBAL LIGATION      I have reviewed the social history and family history with the patient and they are unchanged from previous note.  ALLERGIES:  is allergic to amlodipine besylate, buspirone, fluoxetine, morphine and related, penicillins, and sertraline hcl.  MEDICATIONS:  Current Outpatient Medications  Medication Sig Dispense Refill   acetaminophen (TYLENOL) 500 MG tablet Take 500 mg by mouth every 6 (six) hours as needed for mild pain.     ALPRAZolam (XANAX) 0.25 MG tablet Take 0.25 mg by mouth at bedtime as needed for anxiety.     amLODipine (NORVASC) 2.5 MG tablet Take 1 tablet (2.5 mg total)  by mouth daily. 30 tablet 3   anastrozole (ARIMIDEX) 1 MG tablet Take 1 tablet (1 mg total) by mouth daily. 30 tablet 1   Cholecalciferol (VITAMIN D) 50 MCG (2000 UT) CAPS Take 2,000 Units by mouth daily.     clindamycin (CLINDAGEL) 1 % gel Apply topically 2 (two) times daily. 60 g 1   diltiazem (CARDIZEM) 30 MG tablet Take 1 tablet every 4 hours AS NEEDED for AFIB heart rate >100 45 tablet 3   ELIQUIS 5 MG TABS tablet TAKE ONE TABLET BY MOUTH TWICE DAILY 180 tablet 1   furosemide (LASIX) 20 MG tablet Take 0.5 tablets (10 mg total) by mouth daily. 45 tablet 3   Lactobacillus (PROBIOTIC ACIDOPHILUS PO) Take 1 tablet by mouth daily.     metoprolol succinate (TOPROL-XL) 25 MG 24 hr tablet Take 1 tablet (25 mg total) by mouth daily. Take with or immediately following a meal. 90 tablet 3   potassium chloride (KLOR-CON) 10 MEQ tablet Take 1 tablet (10 mEq total) by mouth 2 (two) times daily. 180 tablet 3   Propylene Glycol 0.6 %  SOLN Place 1 drop into both eyes 2 (two) times daily. systane eye drops     rosuvastatin (CRESTOR) 10 MG tablet Take 10 mg by mouth daily.     sacubitril-valsartan (ENTRESTO) 49-51 MG Take 1 tablet by mouth 2 (two) times daily. 180 tablet 3   No current facility-administered medications for this visit.    PHYSICAL EXAMINATION: ECOG PERFORMANCE STATUS: 1 - Symptomatic but completely ambulatory  Vitals:   08/16/22 1355  BP: (!) 146/72  Pulse: 63  Resp: 14  Temp: 98.1 F (36.7 C)  SpO2: 97%   Wt Readings from Last 3 Encounters:  08/16/22 208 lb 6.4 oz (94.5 kg)  08/15/22 204 lb (92.5 kg)  07/16/22 208 lb (94.3 kg)     GENERAL:alert, no distress and comfortable SKIN: skin color, texture, turgor are normal, no rashes or significant lesions EYES: normal, Conjunctiva are pink and non-injected, sclera clear  NECK: supple, thyroid normal size, non-tender, without nodularity LYMPH:  no palpable lymphadenopathy in the cervical, axillary HEART: (+) mild lower extremity  edema ABDOMEN:abdomen soft, non-tender and normal bowel sounds Musculoskeletal:no cyanosis of digits and no clubbing  NEURO: alert & oriented x 3 with fluent speech, no focal motor/sensory deficits BREAST: No palpable mass, nodules or adenopathy bilaterally. Breast exam benign.   LABORATORY DATA:  I have reviewed the data as listed    Latest Ref Rng & Units 08/16/2022    1:39 PM 01/30/2022   11:22 AM 12/31/2021    1:12 AM  CBC  WBC 4.0 - 10.5 K/uL 6.8  10.3  9.2   Hemoglobin 12.0 - 15.0 g/dL 14.0  14.2  12.3   Hematocrit 36.0 - 46.0 % 40.9  42.7  36.5   Platelets 150 - 400 K/uL 215  231  201         Latest Ref Rng & Units 08/16/2022    1:39 PM 06/13/2022   10:57 AM 04/08/2022    1:16 PM  CMP  Glucose 70 - 99 mg/dL 100  103  99   BUN 8 - 23 mg/dL _0 Creatinine 0.44 - 1.00 mg/dL 0.74  0.81  0.82   Sodium 135 - 145 mmol/L 141  144  146   Potassium 3.5 - 5.1 mmol/L 4.6  5.2  4.7   Chloride 98 - 111 mmol/L 107  106  105   CO2 22 - 32 mmol/L _1 Calcium 8.9 - 10.3 mg/dL 9.5  9.8  9.4   Total Protein 6.5 - 8.1 g/dL 6.5     Total Bilirubin 0.3 - 1.2 mg/dL 0.7     Alkaline Phos 38 - 126 U/L 81     AST 15 - 41 U/L 18     ALT 0 - 44 U/L 12         RADIOGRAPHIC STUDIES: I have personally reviewed the radiological images as listed and agreed with the findings in the report. No results found.    No orders of the defined types were placed in this encounter.  All questions were answered. The patient knows to call the clinic with any problems, questions or concerns. No barriers to learning was detected. The total time spent in the appointment was 30 minutes.     Truitt Merle, MD 08/16/2022   I, Wilburn Mylar, am acting as scribe for Truitt Merle, MD.   I have reviewed the above documentation for accuracy and completeness, and I agree with the above.

## 2022-08-19 ENCOUNTER — Telehealth: Payer: Self-pay | Admitting: Hematology

## 2022-08-19 NOTE — Telephone Encounter (Signed)
Left patient a voicemail regarding upcoming appointment

## 2022-08-31 ENCOUNTER — Other Ambulatory Visit: Payer: Self-pay | Admitting: Hematology

## 2022-08-31 ENCOUNTER — Other Ambulatory Visit: Payer: Self-pay | Admitting: Cardiology

## 2022-09-02 ENCOUNTER — Other Ambulatory Visit: Payer: Self-pay

## 2022-09-02 ENCOUNTER — Telehealth (HOSPITAL_COMMUNITY): Payer: Self-pay | Admitting: *Deleted

## 2022-09-02 NOTE — Telephone Encounter (Signed)
Patient returning call regarding upcoming cardiac imaging study; pt verbalizes understanding of appt date/time, parking situation and where to check in, and verified current allergies; name and call back number provided for further questions should they arise  Gordy Clement RN Navigator Cardiac [facility] [phone] office [phone] cell

## 2022-09-02 NOTE — Telephone Encounter (Signed)
Pt is no longer taking Tamoxifen.

## 2022-09-02 NOTE — Telephone Encounter (Signed)
Attempted to call patient regarding upcoming cardiac CT appointment. °Left message on voicemail with name and callback number ° °Cherylee Rawlinson RN Navigator Cardiac Imaging °[facility] °[phone] Office °[phone] Cell ° °

## 2022-09-02 NOTE — Telephone Encounter (Signed)
Prescription refill request for Eliquis received. Indication:afib Last office visit:8/23 Scr:0.7 Age: 81 Weight:94.5  kg  Prescription refilled

## 2022-09-03 ENCOUNTER — Ambulatory Visit (HOSPITAL_BASED_OUTPATIENT_CLINIC_OR_DEPARTMENT_OTHER)
Admission: RE | Admit: 2022-09-03 | Discharge: 2022-09-03 | Disposition: A | Discharge status: Home / Self Care | Payer: Medicare PPO | Source: Ambulatory Visit | Attending: Cardiology | Admitting: Cardiology

## 2022-09-03 DIAGNOSIS — I4819 Other persistent atrial fibrillation: Secondary | ICD-10-CM | POA: Insufficient documentation

## 2022-09-03 MED ORDER — IOHEXOL 350 MG/ML SOLN
100.0000 mL | Freq: Once | INTRAVENOUS | Status: AC | PRN
  Administered 2022-09-03: 80 mL via INTRAVENOUS

## 2022-09-09 ENCOUNTER — Telehealth: Payer: Self-pay | Admitting: Cardiology

## 2022-09-09 NOTE — Telephone Encounter (Signed)
I called pt and apologized for her not receiving a call yet. I went over her instructions and she understands everything.  She has my number if she needs to call back. I explained to her how to find her letter in her MyChart.Marland KitchenMarland Kitchen

## 2022-09-09 NOTE — Pre-Procedure Instructions (Signed)
Instructed patient on the following items: Arrival time 0800 Nothing to eat or drink after midnight No meds AM of procedure Responsible person to drive you home and stay with you for 24 hrs  Have you missed any doses of anti-coagulant Eliquis- hasn't missed any doses   

## 2022-09-09 NOTE — Telephone Encounter (Signed)
New Message:     Patient says she is scheduled for an Ablation tomorrow, but she have not received any instructions.

## 2022-09-10 ENCOUNTER — Encounter (HOSPITAL_COMMUNITY): Payer: Self-pay | Admitting: Cardiology

## 2022-09-10 ENCOUNTER — Other Ambulatory Visit: Payer: Self-pay

## 2022-09-10 ENCOUNTER — Encounter (HOSPITAL_COMMUNITY)
Admission: RE | Disposition: A | Discharge status: Home / Self Care | Payer: Self-pay | Source: Ambulatory Visit | Attending: Cardiology

## 2022-09-10 ENCOUNTER — Ambulatory Visit (HOSPITAL_COMMUNITY): Payer: Medicare PPO | Admitting: Anesthesiology

## 2022-09-10 ENCOUNTER — Ambulatory Visit (HOSPITAL_BASED_OUTPATIENT_CLINIC_OR_DEPARTMENT_OTHER): Payer: Medicare PPO | Admitting: Anesthesiology

## 2022-09-10 ENCOUNTER — Other Ambulatory Visit: Payer: Self-pay | Admitting: Hematology

## 2022-09-10 ENCOUNTER — Ambulatory Visit (HOSPITAL_COMMUNITY)
Admission: RE | Admit: 2022-09-10 | Discharge: 2022-09-10 | Disposition: A | Discharge status: Home / Self Care | Payer: Medicare PPO | Source: Ambulatory Visit | Attending: Cardiology | Admitting: Cardiology

## 2022-09-10 DIAGNOSIS — I110 Hypertensive heart disease with heart failure: Secondary | ICD-10-CM | POA: Diagnosis not present

## 2022-09-10 DIAGNOSIS — G4733 Obstructive sleep apnea (adult) (pediatric): Secondary | ICD-10-CM | POA: Insufficient documentation

## 2022-09-10 DIAGNOSIS — I509 Heart failure, unspecified: Secondary | ICD-10-CM

## 2022-09-10 DIAGNOSIS — I480 Paroxysmal atrial fibrillation: Secondary | ICD-10-CM | POA: Diagnosis not present

## 2022-09-10 DIAGNOSIS — Z9989 Dependence on other enabling machines and devices: Secondary | ICD-10-CM | POA: Diagnosis not present

## 2022-09-10 DIAGNOSIS — I4891 Unspecified atrial fibrillation: Secondary | ICD-10-CM

## 2022-09-10 DIAGNOSIS — Z853 Personal history of malignant neoplasm of breast: Secondary | ICD-10-CM | POA: Diagnosis not present

## 2022-09-10 DIAGNOSIS — I5020 Unspecified systolic (congestive) heart failure: Secondary | ICD-10-CM | POA: Diagnosis not present

## 2022-09-10 DIAGNOSIS — E785 Hyperlipidemia, unspecified: Secondary | ICD-10-CM | POA: Diagnosis not present

## 2022-09-10 HISTORY — PX: ATRIAL FIBRILLATION ABLATION: EP1191

## 2022-09-10 LAB — POCT ACTIVATED CLOTTING TIME
Activated Clotting Time: 287 seconds
Activated Clotting Time: 335 seconds

## 2022-09-10 SURGERY — ATRIAL FIBRILLATION ABLATION
Anesthesia: General

## 2022-09-10 MED ORDER — SODIUM CHLORIDE 0.9% FLUSH: 3.0000 mL | INTRAVENOUS | Status: DC | PRN

## 2022-09-10 MED ORDER — LIDOCAINE 2% (20 MG/ML) 5 ML SYRINGE
INTRAMUSCULAR | Status: DC | PRN
  Administered 2022-09-10: 80 mg via INTRAVENOUS

## 2022-09-10 MED ORDER — SUGAMMADEX SODIUM 200 MG/2ML IV SOLN
INTRAVENOUS | Status: DC | PRN
  Administered 2022-09-10: 200 mg via INTRAVENOUS

## 2022-09-10 MED ORDER — PROPOFOL 10 MG/ML IV BOLUS
INTRAVENOUS | Status: DC | PRN
  Administered 2022-09-10: 90 mg via INTRAVENOUS
  Administered 2022-09-10: 20 mg via INTRAVENOUS

## 2022-09-10 MED ORDER — ACETAMINOPHEN 325 MG PO TABS: 650.0000 mg | ORAL_TABLET | ORAL | Status: DC | PRN

## 2022-09-10 MED ORDER — SUCCINYLCHOLINE CHLORIDE 200 MG/10ML IV SOSY: PREFILLED_SYRINGE | INTRAVENOUS | Status: DC | PRN

## 2022-09-10 MED ORDER — PROTAMINE SULFATE 10 MG/ML IV SOLN
INTRAVENOUS | Status: DC | PRN
  Administered 2022-09-10: 40 mg via INTRAVENOUS

## 2022-09-10 MED ORDER — DOBUTAMINE INFUSION FOR EP/ECHO/NUC (1000 MCG/ML)
INTRAVENOUS | Status: DC | PRN
  Administered 2022-09-10: 20 ug/kg/min via INTRAVENOUS

## 2022-09-10 MED ORDER — HEPARIN SODIUM (PORCINE) 1000 UNIT/ML IJ SOLN
INTRAMUSCULAR | Status: DC | PRN
  Administered 2022-09-10: 1000 [IU] via INTRAVENOUS

## 2022-09-10 MED ORDER — ROCURONIUM BROMIDE 10 MG/ML (PF) SYRINGE
PREFILLED_SYRINGE | INTRAVENOUS | Status: DC | PRN
  Administered 2022-09-10: 70 mg via INTRAVENOUS

## 2022-09-10 MED ORDER — DEXAMETHASONE SODIUM PHOSPHATE 10 MG/ML IJ SOLN
INTRAMUSCULAR | Status: DC | PRN
  Administered 2022-09-10: 10 mg via INTRAVENOUS

## 2022-09-10 MED ORDER — HEPARIN (PORCINE) IN NACL 1000-0.9 UT/500ML-% IV SOLN
INTRAVENOUS | Status: DC | PRN
  Administered 2022-09-10 (×4): 500 mL

## 2022-09-10 MED ORDER — ONDANSETRON HCL 4 MG/2ML IJ SOLN
INTRAMUSCULAR | Status: DC | PRN
  Administered 2022-09-10: 4 mg via INTRAVENOUS

## 2022-09-10 MED ORDER — HEPARIN SODIUM (PORCINE) 1000 UNIT/ML IJ SOLN
INTRAMUSCULAR | Status: DC | PRN
  Administered 2022-09-10: 14000 [IU] via INTRAVENOUS
  Administered 2022-09-10: 4000 [IU] via INTRAVENOUS

## 2022-09-10 MED ORDER — FENTANYL CITRATE (PF) 100 MCG/2ML IJ SOLN
INTRAMUSCULAR | Status: DC | PRN
  Administered 2022-09-10: 100 ug via INTRAVENOUS

## 2022-09-10 MED ORDER — SODIUM CHLORIDE 0.9 % IV SOLN: INTRAVENOUS | Status: DC

## 2022-09-10 MED ORDER — SODIUM CHLORIDE 0.9 % IV SOLN: 250.0000 mL | INTRAVENOUS | Status: DC | PRN

## 2022-09-10 MED ORDER — PHENYLEPHRINE HCL-NACL 20-0.9 MG/250ML-% IV SOLN
INTRAVENOUS | Status: DC | PRN
  Administered 2022-09-10: 30 ug/min via INTRAVENOUS

## 2022-09-10 MED ORDER — ONDANSETRON HCL 4 MG/2ML IJ SOLN: 4.0000 mg | Freq: Four times a day (QID) | INTRAMUSCULAR | Status: DC | PRN

## 2022-09-10 SURGICAL SUPPLY — 21 items
BAG SNAP BAND KOVER 36X36 (MISCELLANEOUS) IMPLANT
BLANKET WARM UNDERBOD FULL ACC (MISCELLANEOUS) ×1 IMPLANT
CATH 8FR REPROCESSED SOUNDSTAR (CATHETERS) ×1 IMPLANT
CATH 8FR SOUNDSTAR REPROCESSED (CATHETERS) IMPLANT
CATH ABLAT QDOT MICRO BI TC DF (CATHETERS) IMPLANT
CATH OCTARAY 2.0 F 3-3-3-3-3 (CATHETERS) IMPLANT
CATH PIGTAIL STEERABLE D1 8.7 (WIRE) IMPLANT
CATH S-M CIRCA TEMP PROBE (CATHETERS) IMPLANT
CATH WEB BI DIR CSDF CRV REPRO (CATHETERS) IMPLANT
CLOSURE PERCLOSE PROSTYLE (VASCULAR PRODUCTS) IMPLANT
COVER SWIFTLINK CONNECTOR (BAG) ×1 IMPLANT
PACK EP LATEX FREE (CUSTOM PROCEDURE TRAY) ×1
PACK EP LF (CUSTOM PROCEDURE TRAY) ×1 IMPLANT
PAD DEFIB RADIO PHYSIO CONN (PAD) ×1 IMPLANT
PATCH CARTO3 (PAD) IMPLANT
SHEATH CARTO VIZIGO SM CVD (SHEATH) IMPLANT
SHEATH PINNACLE 7F 10CM (SHEATH) IMPLANT
SHEATH PINNACLE 8F 10CM (SHEATH) IMPLANT
SHEATH PINNACLE 9F 10CM (SHEATH) IMPLANT
SHEATH PROBE COVER 6X72 (BAG) IMPLANT
TUBING SMART ABLATE COOLFLOW (TUBING) IMPLANT

## 2022-09-10 NOTE — Anesthesia Preprocedure Evaluation (Signed)
Anesthesia Evaluation  Patient identified by MRN, date of birth, ID band Patient awake    Reviewed: Allergy & Precautions, H&P , NPO status , Patient's Chart, lab work & pertinent test results  Airway Mallampati: II  TM Distance: >3 FB Neck ROM: Full    Dental no notable dental hx.    Pulmonary sleep apnea and Continuous Positive Airway Pressure Ventilation    Pulmonary exam normal breath sounds clear to auscultation       Cardiovascular hypertension, +CHF  + dysrhythmias Atrial Fibrillation + Valvular Problems/Murmurs  Rhythm:Regular Rate:Normal + Systolic murmurs  1. Left ventricular ejection fraction, by estimation, is 40 to 45%. The  left ventricle has mildly decreased function.   2. Right ventricular systolic function is mildly reduced. The right  ventricular size is normal.   3. Left atrial size was mildly dilated. No left atrial/left atrial  appendage thrombus was detected.   4. Right atrial size was mildly dilated.   5. A small pericardial effusion is present.   6. The mitral valve is normal in structure. Mild mitral valve  regurgitation.   7. Tricuspid valve regurgitation is mild to moderate.   8. The aortic valve is tricuspid. Aortic valve regurgitation is not  visualized.   9. Aortic dilatation noted. There is mild dilatation of the ascending  aorta, measuring 38 mm.     Neuro/Psych negative neurological ROS  negative psych ROS   GI/Hepatic negative GI ROS, Neg liver ROS,,,  Endo/Other  negative endocrine ROS    Renal/GU negative Renal ROS  negative genitourinary   Musculoskeletal negative musculoskeletal ROS (+)    Abdominal   Peds negative pediatric ROS (+)  Hematology negative hematology ROS (+)   Anesthesia Other Findings   Reproductive/Obstetrics negative OB ROS                             Anesthesia Physical Anesthesia Plan  ASA: 3  Anesthesia Plan: General    Post-op Pain Management: Minimal or no pain anticipated   Induction: Intravenous  PONV Risk Score and Plan: 3 and Ondansetron, Dexamethasone and Treatment may vary due to age or medical condition  Airway Management Planned: Oral ETT  Additional Equipment:   Intra-op Plan:   Post-operative Plan: Extubation in OR  Informed Consent: I have reviewed the patients History and Physical, chart, labs and discussed the procedure including the risks, benefits and alternatives for the proposed anesthesia with the patient or authorized representative who has indicated his/her understanding and acceptance.     Dental advisory given  Plan Discussed with: CRNA and Surgeon  Anesthesia Plan Comments:        Anesthesia Quick Evaluation

## 2022-09-10 NOTE — Discharge Instructions (Signed)

## 2022-09-10 NOTE — Anesthesia Postprocedure Evaluation (Signed)
Anesthesia Post Note  Patient: Shannon Liu  Procedure(s) Performed: ATRIAL FIBRILLATION ABLATION     Patient location during evaluation: PACU Anesthesia Type: General Level of consciousness: awake and alert Pain management: pain level controlled Vital Signs Assessment: post-procedure vital signs reviewed and stable Respiratory status: spontaneous breathing, nonlabored ventilation, respiratory function stable and patient connected to nasal cannula oxygen Cardiovascular status: blood pressure returned to baseline and stable Postop Assessment: no apparent nausea or vomiting Anesthetic complications: yes  Encounter Notable Events  Notable Event Outcome Phase Comment  Difficult to intubate - unexpected  Intraprocedure Filed from anesthesia note documentation.    Last Vitals:  Vitals:   09/10/22 1255 09/10/22 1300  BP: 126/61 127/66  Pulse: 78 77  Resp: 11 10  Temp:    SpO2: 95% 93%    Last Pain:  Vitals:   09/10/22 1300  TempSrc:   PainSc: 0-No pain                 Nellie Pester S

## 2022-09-10 NOTE — H&P (Signed)
Electrophysiology Office Note   Date:  09/10/2022   ID:  Ninette, Cotta 16-Mar-1941, MRN [id number]  PCP:  Colon Branch, MD  Cardiologist:  Marlou Porch Primary Electrophysiologist:  Carly Applegate Meredith Leeds, MD    Chief Complaint: AF   History of Present Illness: Shannon Liu is a 81 y.o. female who is being seen today for the evaluation of AF at the request of No ref. provider found. Presenting today for electrophysiology evaluation.  He has a history significant for atrial fibrillation, hypertension, hyperlipidemia, OSA on CPAP, breast cancer.  She has had multiple episodes of atrial fibrillation over the last few months.  She has mild systolic heart failure by her echo with an ejection fraction of 40 to 45%.  Unfortunately she has been having more frequent episodes of atrial fibrillation.  Heart rates have been in the 160s at times.  She had a cardioversion, but continued to have intermittent short episodes.  Today, denies symptoms of palpitations, chest pain, shortness of breath, orthopnea, PND, lower extremity edema, claudication, dizziness, presyncope, syncope, bleeding, or neurologic sequela. The patient is tolerating medications without difficulties. Plan ablation today.    Past Medical History:  Diagnosis Date   Anxiety    Arthritis    Cancer ([place]) 2023   right breast   Cataract    CHF (congestive heart failure) ([place])    Dysrhythmia    A fib   Heart murmur    HOH (hard of hearing)    Hyperlipidemia    Hypertension    PAF (paroxysmal atrial fibrillation) ([place])    a. dx on 06/2015 admission. spontaneously converted into NSR. placed on Eliquis   Sleep apnea    CPAP   Past Surgical History:  Procedure Laterality Date   ABDOMINAL HYSTERECTOMY     BREAST CYST EXCISION Left    BREAST LUMPECTOMY WITH RADIOACTIVE SEED LOCALIZATION Right 01/31/2022   Procedure: RIGHT BREAST LUMPECTOMY WITH RADIOACTIVE SEED LOCALIZATION;  Surgeon: Erroll Luna, MD;  Location: [place];   Service: General;  Laterality: Right;   BREAST SURGERY Left    lumpectomy 35 yrs ago   CARDIOVERSION N/A 12/31/2021   Procedure: CARDIOVERSION;  Surgeon: Donato Heinz, MD;  Location: [facility] ENDOSCOPY;  Service: Cardiovascular;  Laterality: N/A;   DILATION AND CURETTAGE OF UTERUS     NECK SURGERY     discectomy with cadaver bone   TEE WITHOUT CARDIOVERSION N/A 12/31/2021   Procedure: TRANSESOPHAGEAL ECHOCARDIOGRAM (TEE);  Surgeon: Donato Heinz, MD;  Location: [facility] ENDOSCOPY;  Service: Cardiovascular;  Laterality: N/A;   TONSILLECTOMY     removed as a child   TUBAL LIGATION       Current Facility-Administered Medications  Medication Dose Route Frequency Provider Last Rate Last Admin   0.9 %  sodium chloride infusion   Intravenous Continuous Virginio Isidore, Ocie Doyne, MD 50 mL/hr at 09/10/22 0900 New Bag at 09/10/22 0900    Allergies:   Amlodipine besylate, Buspirone, Fluoxetine, Morphine and related, Penicillins, and Sertraline hcl   Social History:  The patient  reports that she has never smoked. She has never used smokeless tobacco. She reports that she does not drink alcohol and does not use drugs.   Family History:  The patient's family history includes CVA (age of onset: 24) in her mother; Colon cancer in her maternal grandfather; Heart attack (age of onset: 70) in her mother; Hypertension in her brother and sister; Prostate cancer (age of onset: 20) in her son; Unexplained death (age of  onset: 44) in her father.   ROS:  Please see the history of present illness.   Otherwise, review of systems is positive for none.   All other systems are reviewed and negative.   PHYSICAL EXAM: VS:  BP (!) 155/86   Pulse 79   Temp 97.8 F (36.6 C) (Temporal)   Resp 16   Ht '5\' 7"'$  (1.702 m)   Wt 93 kg   SpO2 97%   BMI 32.11 kg/m  , BMI Body mass index is 32.11 kg/m. GEN: Well nourished, well developed, in no acute distress  HEENT: normal  Neck: no JVD, carotid bruits, or  masses Cardiac: RRR; no murmurs, rubs, or gallops,no edema  Respiratory:  clear to auscultation bilaterally, normal work of breathing GI: soft, nontender, nondistended, + BS MS: no deformity or atrophy  Skin: warm and dry Neuro:  Strength and sensation are intact Psych: euthymic mood, full affect  Recent Labs: 12/26/2021: TSH 0.819 12/28/2021: Magnesium 2.1 08/16/2022: ALT 12; BUN 18; Creatinine 0.74; Hemoglobin 14.0; Platelet Count 215; Potassium 4.6; Sodium 141    Lipid Panel     Component Value Date/Time   CHOL 142 06/24/2015 0329   TRIG 118 06/24/2015 0329   HDL 36 (L) 06/24/2015 0329   CHOLHDL 3.9 06/24/2015 0329   VLDL 24 06/24/2015 0329   LDLCALC 82 06/24/2015 0329     Wt Readings from Last 3 Encounters:  09/10/22 93 kg  08/16/22 94.5 kg  08/15/22 92.5 kg      Other studies Reviewed: Additional studies/ records that were reviewed today include: TTE 12/27/21  Review of the above records today demonstrates:   1. Left ventricular ejection fraction, by estimation, is 40 to 45%. The  left ventricle has mildly decreased function. The left ventricle  demonstrates global hypokinesis. The left ventricular internal cavity size  was mildly dilated. There is mild left  ventricular hypertrophy. Left ventricular diastolic function could not be  evaluated.   2. Right ventricular systolic function is mildly reduced. The right  ventricular size is mildly enlarged. There is mildly elevated pulmonary  artery systolic pressure. The estimated right ventricular systolic  pressure is 16.0 mmHg.   3. Left atrial size was mildly dilated.   4. Right atrial size was moderately dilated.   5. A small pericardial effusion is present.   6. The mitral valve is normal in structure. Mild mitral valve  regurgitation.   7. Tricuspid valve regurgitation is mild to moderate.   8. The aortic valve is normal in structure. Aortic valve regurgitation is  not visualized. No aortic stenosis is present.    9. The inferior vena cava is dilated in size with <50% respiratory  variability, suggesting right atrial pressure of 15 mmHg.   Cardiac monitor 01/30/2022 personally reviewed Sinus rhythm average heart rate 52 bpm. Brief episodes of atrial tachycardia noted. PACs-9%, frequent Occasional PVCs-4% No evidence of atrial fibrillation noted. Overall reassuring. Continue with diltiazem and metoprolol.  ASSESSMENT AND PLAN:  1.  Paroxysmal atrial fibrillation: Shannon Liu has presented today for surgery, with the diagnosis of AF.  The various methods of treatment have been discussed with the patient and family. After consideration of risks, benefits and other options for treatment, the patient has consented to  Procedure(s): Catheter ablation as a surgical intervention .  Risks include but not limited to complete heart block, stroke, esophageal damage, nerve damage, bleeding, vascular damage, tamponade, perforation, MI, and death. The patient's history has been reviewed, patient examined, no change  in status, stable for surgery.  I have reviewed the patient's chart and labs.  Questions were answered to the patient's satisfaction.    Josie Burleigh Curt Bears, MD 09/10/2022 9:38 AM

## 2022-09-10 NOTE — Anesthesia Procedure Notes (Signed)
Procedure Name: Intubation Date/Time: 09/10/2022 10:33 AM  Performed by: Janace Litten, CRNAPre-anesthesia Checklist: Patient identified, Emergency Drugs available, Suction available and Patient being monitored Patient Re-evaluated:Patient Re-evaluated prior to induction Oxygen Delivery Method: Circle System Utilized Preoxygenation: Pre-oxygenation with 100% oxygen Induction Type: IV induction and Combination inhalational/ intravenous induction Ventilation: Mask ventilation without difficulty and Oral airway inserted - appropriate to patient size Laryngoscope Size: Glidescope and 3 Grade View: Grade I Tube type: Oral Tube size: 7.0 mm Number of attempts: 2 Airway Equipment and Method: Stylet, Oral airway and Video-laryngoscopy Placement Confirmation: ETT inserted through vocal cords under direct vision, positive ETCO2 and breath sounds checked- equal and bilateral Secured at: 21 cm Tube secured with: Tape Dental Injury: Teeth and Oropharynx as per pre-operative assessment  Difficulty Due To: Difficulty was unanticipated, Difficult Airway- due to anterior larynx and Difficult Airway- due to reduced neck mobility Comments: DLx1 with MAC 3 - grade IV view. BMV between attempts. VLx1 with glidescope 3 - grade I view, atraumatic intubation of oral 7.0 ETT. BBS=, +ETCO2. VSS.

## 2022-09-10 NOTE — Progress Notes (Signed)
Patient and daughter was given discharge instructions. Both verbalized understanding. 

## 2022-09-10 NOTE — Transfer of Care (Signed)
Immediate Anesthesia Transfer of Care Note  Patient: Shannon Liu  Procedure(s) Performed: ATRIAL FIBRILLATION ABLATION  Patient Location: PACU and Cath Lab  Anesthesia Type:General  Level of Consciousness: drowsy, patient cooperative, and responds to stimulation  Airway & Oxygen Therapy: Patient Spontanous Breathing  Post-op Assessment: Report given to RN and Post -op Vital signs reviewed and stable  Post vital signs: Reviewed and stable  Last Vitals:  Vitals Value Taken Time  BP 127/59 09/10/22 1239  Temp 36.6 C 09/10/22 1239  Pulse 79 09/10/22 1241  Resp 15 09/10/22 1241  SpO2 93 % 09/10/22 1241  Vitals shown include unvalidated device data.  Last Pain:  Vitals:   09/10/22 1239  TempSrc:   PainSc: 0-No pain      Patients Stated Pain Goal: 4 (49/82/64 1583)  Complications:  Encounter Notable Events  Notable Event Outcome Phase Comment  Difficult to intubate - unexpected  Intraprocedure Filed from anesthesia note documentation.

## 2022-09-11 ENCOUNTER — Encounter (HOSPITAL_COMMUNITY): Payer: Self-pay | Admitting: Cardiology

## 2022-09-11 ENCOUNTER — Other Ambulatory Visit: Payer: Self-pay

## 2022-09-11 MED ORDER — ROSUVASTATIN CALCIUM 10 MG PO TABS: 10.0000 mg | ORAL_TABLET | Freq: Every day | ORAL | 1 refills | Status: DC

## 2022-09-19 ENCOUNTER — Telehealth: Payer: Self-pay | Admitting: Cardiology

## 2022-09-19 MED ORDER — AMLODIPINE BESYLATE 2.5 MG PO TABS: 2.5000 mg | ORAL_TABLET | Freq: Every day | ORAL | 1 refills | Status: DC

## 2022-09-19 NOTE — Telephone Encounter (Signed)
*  STAT* If patient is at the pharmacy, call can be transferred to refill team.   1. Which medications need to be refilled? (please list name of each medication and dose if known)   amLODipine (NORVASC) 2.5 MG tablet   2. Which pharmacy/location (including street and city if local pharmacy) is medication to be sent to? [facility] on [address] in [place], [place]  3. Do they need a 30 day or 90 day supply? 90 day   Patient is out of medication

## 2022-09-19 NOTE — Telephone Encounter (Signed)
Rx refill sent to pharmacy. 

## 2022-10-10 ENCOUNTER — Encounter (HOSPITAL_COMMUNITY): Payer: Self-pay | Admitting: Nurse Practitioner

## 2022-10-10 ENCOUNTER — Ambulatory Visit (HOSPITAL_COMMUNITY)
Admission: RE | Admit: 2022-10-10 | Discharge: 2022-10-10 | Disposition: A | Discharge status: Home / Self Care | Payer: Medicare PPO | Source: Ambulatory Visit | Attending: Nurse Practitioner | Admitting: Nurse Practitioner

## 2022-10-10 VITALS — BP 162/84 | HR 61 | Ht 67.0 in | Wt 210.8 lb

## 2022-10-10 DIAGNOSIS — Z7901 Long term (current) use of anticoagulants: Secondary | ICD-10-CM | POA: Diagnosis not present

## 2022-10-10 DIAGNOSIS — Z79899 Other long term (current) drug therapy: Secondary | ICD-10-CM | POA: Diagnosis not present

## 2022-10-10 DIAGNOSIS — D6869 Other thrombophilia: Secondary | ICD-10-CM

## 2022-10-10 DIAGNOSIS — I10 Essential (primary) hypertension: Secondary | ICD-10-CM | POA: Diagnosis not present

## 2022-10-10 DIAGNOSIS — I480 Paroxysmal atrial fibrillation: Secondary | ICD-10-CM | POA: Insufficient documentation

## 2022-10-10 DIAGNOSIS — Z8249 Family history of ischemic heart disease and other diseases of the circulatory system: Secondary | ICD-10-CM | POA: Insufficient documentation

## 2022-10-10 MED ORDER — FUROSEMIDE 20 MG PO TABS: 20.0000 mg | ORAL_TABLET | Freq: Every day | ORAL | Status: DC

## 2022-10-10 NOTE — Progress Notes (Signed)
Primary Care Physician: Colon Branch, MD Referring Physician: Dr. Karlton Lemon Shannon Liu is a 81 y.o. female with a h/o afib that is here one month f/u ablation, performed 09/10/22 by Dr. Curt Bears. She has done well since ablation and has not noted any afib. No swallowing or groin issues. She is back to normal activities. No anticoagulation concerns, is compliant.   Today, she denies symptoms of palpitations, chest pain, shortness of breath, orthopnea, PND, lower extremity edema, dizziness, presyncope, syncope, or neurologic sequela. The patient is tolerating medications without difficulties and is otherwise without complaint today.   Past Medical History:  Diagnosis Date   Anxiety    Arthritis    Cancer ([place]) 2023   right breast   Cataract    CHF (congestive heart failure) ([place])    Dysrhythmia    A fib   Heart murmur    HOH (hard of hearing)    Hyperlipidemia    Hypertension    PAF (paroxysmal atrial fibrillation) ([place])    a. dx on 06/2015 admission. spontaneously converted into NSR. placed on Eliquis   Sleep apnea    CPAP   Past Surgical History:  Procedure Laterality Date   ABDOMINAL HYSTERECTOMY     ATRIAL FIBRILLATION ABLATION N/A 09/10/2022   Procedure: ATRIAL FIBRILLATION ABLATION;  Surgeon: Constance Haw, MD;  Location: [place];  Service: Cardiovascular;  Laterality: N/A;   BREAST CYST EXCISION Left    BREAST LUMPECTOMY WITH RADIOACTIVE SEED LOCALIZATION Right 01/31/2022   Procedure: RIGHT BREAST LUMPECTOMY WITH RADIOACTIVE SEED LOCALIZATION;  Surgeon: Erroll Luna, MD;  Location: [place];  Service: General;  Laterality: Right;   BREAST SURGERY Left    lumpectomy 35 yrs ago   CARDIOVERSION N/A 12/31/2021   Procedure: CARDIOVERSION;  Surgeon: Donato Heinz, MD;  Location: [facility] ENDOSCOPY;  Service: Cardiovascular;  Laterality: N/A;   DILATION AND CURETTAGE OF UTERUS     NECK SURGERY     discectomy with cadaver bone   TEE WITHOUT  CARDIOVERSION N/A 12/31/2021   Procedure: TRANSESOPHAGEAL ECHOCARDIOGRAM (TEE);  Surgeon: Donato Heinz, MD;  Location: [facility] ENDOSCOPY;  Service: Cardiovascular;  Laterality: N/A;   TONSILLECTOMY     removed as a child   TUBAL LIGATION      Current Outpatient Medications  Medication Sig Dispense Refill   acetaminophen (TYLENOL) 500 MG tablet Take 500 mg by mouth every 6 (six) hours as needed for mild pain.     ALPRAZolam (XANAX) 0.25 MG tablet Take 0.25 mg by mouth at bedtime as needed for anxiety.     amLODipine (NORVASC) 2.5 MG tablet Take 1 tablet (2.5 mg total) by mouth daily. 90 tablet 1   Cholecalciferol (VITAMIN D) 50 MCG (2000 UT) CAPS Take 2,000 Units by mouth daily.     diltiazem (CARDIZEM) 30 MG tablet Take 1 tablet every 4 hours AS NEEDED for AFIB heart rate >100 45 tablet 3   ELIQUIS 5 MG TABS tablet TAKE ONE TABLET BY MOUTH TWICE DAILY 180 tablet 1   Lactobacillus (PROBIOTIC ACIDOPHILUS PO) Take 1 tablet by mouth daily.     metoprolol succinate (TOPROL-XL) 25 MG 24 hr tablet Take 1 tablet (25 mg total) by mouth daily. Take with or immediately following a meal. 90 tablet 3   potassium chloride (KLOR-CON) 10 MEQ tablet Take 1 tablet (10 mEq total) by mouth 2 (two) times daily. 180 tablet 3   Propylene Glycol 0.6 % SOLN Place 1 drop into  both eyes at bedtime. systane eye drops     rosuvastatin (CRESTOR) 10 MG tablet Take 1 tablet (10 mg total) by mouth at bedtime. 90 tablet 1   sacubitril-valsartan (ENTRESTO) 49-51 MG Take 1 tablet by mouth 2 (two) times daily. 180 tablet 3   anastrozole (ARIMIDEX) 1 MG tablet TAKE ONE TABLET BY MOUTH ONCE DAILY (Patient not taking: Reported on 10/10/2022) 90 tablet 0   clindamycin (CLINDAGEL) 1 % gel Apply topically 2 (two) times daily. (Patient not taking: Reported on 10/10/2022) 60 g 1   furosemide (LASIX) 20 MG tablet Take 1 tablet (20 mg total) by mouth daily.     No current facility-administered medications for this encounter.     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    Daughter currently lives with her.   Social Determinants of Health   Financial Resource Strain: Low Risk  (08/15/2022)   Overall Financial Resource Strain (CARDIA)    Difficulty of Paying Living Expenses: Not hard at all  Food Insecurity: No Food Insecurity (08/15/2022)   Hunger Vital Sign    Worried About Running Out of Food in the Last Year: Never true    Ran Out of Food in the Last Year: Never true  Transportation Needs: No Transportation Needs (08/15/2022)   PRAPARE - Hydrologist (Medical): No    Lack of Transportation (Non-Medical): No  Physical Activity: Insufficiently Active (08/15/2022)   Exercise Vital Sign    Days of Exercise per Week: 2 days    Minutes of Exercise per Session: 60 min  Stress: No Stress Concern Present (08/15/2022)   [place]    Feeling of Stress : Not at all  Social  Connections: Moderately Isolated (08/15/2022)   Social Connection and Isolation Panel [NHANES]    Frequency of Communication with Friends and Family: More than three times a week    Frequency of Social Gatherings with Friends and Family: More than three times a week    Attends Religious Services: More than 4 times per year    Active Member of [employer] or Organizations: No    Attends Archivist Meetings: Never    Marital Status: Widowed  Intimate Partner Violence: Not At Risk (08/15/2022)   Humiliation, Afraid, Rape, and Kick questionnaire    Fear of Current or Ex-Partner: No  Emotionally Abused: No    Physically Abused: No    Sexually Abused: No    Family History  Problem Relation Age of Onset   Heart attack Mother 24   CVA Mother 2   Unexplained death Father 44   Hypertension Sister    Hypertension Brother    Colon cancer Maternal Grandfather    Prostate cancer Son 64   Breast cancer Neg Hx     ROS- All systems are reviewed and negative except as per the HPI above  Physical Exam: Vitals:   10/10/22 1354  BP: (!) 162/84  Pulse: 61  Weight: 95.6 kg  Height: '5\' 7"'$  (1.702 m)   Wt Readings from Last 3 Encounters:  10/10/22 95.6 kg  09/10/22 93 kg  08/16/22 94.5 kg    Labs: Lab Results  Component Value Date   NA 141 08/16/2022   K 4.6 08/16/2022   CL 107 08/16/2022   CO2 29 08/16/2022   GLUCOSE 100 (H) 08/16/2022   BUN 18 08/16/2022   CREATININE 0.74 08/16/2022   CALCIUM 9.5 08/16/2022   MG 2.1 12/28/2021   Lab Results  Component Value Date   INR 1.3 (H) 12/30/2021   Lab Results  Component Value Date   CHOL 142 06/24/2015   HDL 36 (L) 06/24/2015   LDLCALC 82 06/24/2015   TRIG 118 06/24/2015     GEN- The patient is well appearing, alert and oriented x 3 today.   Head- normocephalic, atraumatic Eyes-  Sclera clear, conjunctiva pink Ears- hearing intact Oropharynx- clear Neck- supple, no JVP Lymph- no cervical lymphadenopathy Lungs- Clear  to ausculation bilaterally, normal work of breathing Heart- Regular rate and rhythm, no murmurs, rubs or gallops, PMI not laterally displaced GI- soft, NT, ND, + BS Extremities- no clubbing, cyanosis, or edema MS- no significant deformity or atrophy Skin- no rash or lesion Psych- euthymic mood, full affect Neuro- strength and sensation are intact  EKG-Vent. rate 61 BPM PR interval 170 ms QRS duration 70 ms QT/QTcB 428/430 ms P-R-T axes 40 43 9 Normal sinus rhythm with sinus arrhythmia Low voltage QRS Cannot rule out Anterior infarct , age undetermined Abnormal ECG When compared with ECG of 10-Sep-2022 12:48, PREVIOUS ECG IS PRESENT    Assessment and Plan:  1. Afib S/p afib ablation  She is doing well staying in SR Continue metoprolol xl  25 mg daily  2. CHA2DS2VASc  score of at least 6 Continue eliquis 5 mg bid without interruption for the 3 month healing period  3. HTN Elevated today She sees Dr. Marlou Porch 1/18 I encouraged her to keep a BP log to take in for him to review at that appointment  Avoid salt   Butch Penny C. Brantlee Hinde, [facility] [address] [place], [place] [address] [phone]

## 2022-10-13 ENCOUNTER — Encounter: Payer: Self-pay | Admitting: Internal Medicine

## 2022-10-16 ENCOUNTER — Ambulatory Visit: Payer: Medicare PPO | Admitting: Internal Medicine

## 2022-10-21 ENCOUNTER — Encounter: Payer: Self-pay | Admitting: Hematology

## 2022-10-22 ENCOUNTER — Telehealth: Payer: Self-pay | Admitting: Nurse Practitioner

## 2022-10-22 MED ORDER — EXEMESTANE 25 MG PO TABS: 25.0000 mg | ORAL_TABLET | Freq: Every day | ORAL | 3 refills | Status: DC

## 2022-10-22 NOTE — Telephone Encounter (Signed)
I called Ms. Fleissner in response to her [employer]. She feels better off anastrozole. She inquired about tamoxifen and letrozole.   Per chart review, she experienced anxiety with tamoxifen in the past, but she reports she had Afib and some other meds were changed around that time and she isn't sure the anxiety came from tamoxifen.   I reviewed letrozole and anastrozole are similar and might cause similar joint pain, so I don't strongly recommend letrozole.  She has normal bone density. OK to try exemestane which I recommend as next option. We also discussed no further AI and proceeding with surveillance, which would also be reasonable.   Ultimately she decided to try exemestane, can start any time. Next f/up in 02/2023. She will notify us sooner if she develops SE's or has other issues.   She appreciates the call.   Cira Rue, NP

## 2022-10-23 ENCOUNTER — Ambulatory Visit: Payer: Medicare PPO | Admitting: Internal Medicine

## 2022-10-23 ENCOUNTER — Encounter: Payer: Self-pay | Admitting: Internal Medicine

## 2022-10-23 VITALS — BP 138/86 | HR 83 | Temp 97.9°F | Resp 18 | Ht 67.0 in | Wt 209.4 lb

## 2022-10-23 DIAGNOSIS — I10 Essential (primary) hypertension: Secondary | ICD-10-CM

## 2022-10-23 DIAGNOSIS — R739 Hyperglycemia, unspecified: Secondary | ICD-10-CM | POA: Diagnosis not present

## 2022-10-23 DIAGNOSIS — F411 Generalized anxiety disorder: Secondary | ICD-10-CM | POA: Diagnosis not present

## 2022-10-23 DIAGNOSIS — E7800 Pure hypercholesterolemia, unspecified: Secondary | ICD-10-CM | POA: Diagnosis not present

## 2022-10-23 LAB — BASIC METABOLIC PANEL
BUN: 19 mg/dL (ref 6–23)
CO2: 28 mEq/L (ref 19–32)
Calcium: 9.5 mg/dL (ref 8.4–10.5)
Chloride: 106 mEq/L (ref 96–112)
Creatinine, Ser: 0.74 mg/dL (ref 0.40–1.20)
GFR: 76 mL/min (ref >60.00)
Glucose, Bld: 106 mg/dL — ABNORMAL HIGH (ref 70–99)
Potassium: 4.6 mEq/L (ref 3.5–5.1)
Sodium: 143 mEq/L (ref 135–145)

## 2022-10-23 LAB — LIPID PANEL
Cholesterol: 161 mg/dL (ref 0–200)
HDL: 51.3 mg/dL (ref >39.00)
LDL Cholesterol: 74 mg/dL (ref 0–99)
NonHDL: 109.94
Total CHOL/HDL Ratio: 3
Triglycerides: 180 mg/dL — ABNORMAL HIGH (ref 0.0–149.0)
VLDL: 36 mg/dL (ref 0.0–40.0)

## 2022-10-23 LAB — HEMOGLOBIN A1C: Hgb A1c MFr Bld: 6.1 % (ref 4.6–6.5)

## 2022-10-23 MED ORDER — ALPRAZOLAM 0.25 MG PO TABS: 0.2500 mg | ORAL_TABLET | Freq: Every evening | ORAL | 0 refills | Status: DC | PRN

## 2022-10-23 NOTE — Progress Notes (Unsigned)
Subjective:    Patient ID: Shannon Liu, female    DOB: Dec 07, 1940, 82 y.o.   MRN: [id number]  DOS:  10/23/2022 Type of visit - description: Follow-up  Since the last office visit is doing well. Seen by  cardiology and hematology, notes reviewed. Reports she had a COVID infection 2 weeks ago, did not take antivirals, she feels fully recuperated. Occasionally feels a growling sound from the stomach but denies nausea vomiting.  No abdominal pain.  No blood in the stools or change in the color of the stools. Denies chest pain or difficulty breathing   Review of Systems See above   Past Medical History:  Diagnosis Date   Anxiety    Arthritis    Cancer ([place]) 2023   right breast   Cataract    CHF (congestive heart failure) (HCC)    Dysrhythmia    A fib   Heart murmur    HOH (hard of hearing)    Hyperlipidemia    Hypertension    PAF (paroxysmal atrial fibrillation) ([place])    a. dx on 06/2015 admission. spontaneously converted into NSR. placed on Eliquis   Sleep apnea    CPAP    Past Surgical History:  Procedure Laterality Date   ABDOMINAL HYSTERECTOMY     ATRIAL FIBRILLATION ABLATION N/A 09/10/2022   Procedure: ATRIAL FIBRILLATION ABLATION;  Surgeon: Constance Haw, MD;  Location: [facility];  Service: Cardiovascular;  Laterality: N/A;   BREAST CYST EXCISION Left    BREAST LUMPECTOMY WITH RADIOACTIVE SEED LOCALIZATION Right 01/31/2022   Procedure: RIGHT BREAST LUMPECTOMY WITH RADIOACTIVE SEED LOCALIZATION;  Surgeon: Erroll Luna, MD;  Location: [facility];  Service: General;  Laterality: Right;   BREAST SURGERY Left    lumpectomy 35 yrs ago   CARDIOVERSION N/A 12/31/2021   Procedure: CARDIOVERSION;  Surgeon: Donato Heinz, MD;  Location: [facility];  Service: Cardiovascular;  Laterality: N/A;   DILATION AND CURETTAGE OF UTERUS     NECK SURGERY     discectomy with cadaver bone   TEE WITHOUT CARDIOVERSION N/A 12/31/2021   Procedure: TRANSESOPHAGEAL  ECHOCARDIOGRAM (TEE);  Surgeon: Donato Heinz, MD;  Location: [facility];  Service: Cardiovascular;  Laterality: N/A;   TONSILLECTOMY     removed as a child   TUBAL LIGATION      Current Outpatient Medications  Medication Instructions   acetaminophen (TYLENOL) 500 mg, Oral, Every 6 hours PRN   ALPRAZolam (XANAX) 0.25 mg, Oral, At bedtime PRN   amLODipine (NORVASC) 2.5 mg, Oral, Daily   clindamycin (CLINDAGEL) 1 % gel Topical, 2 times daily   diltiazem (CARDIZEM) 30 MG tablet Take 1 tablet every 4 hours AS NEEDED for AFIB heart rate >100   ELIQUIS 5 MG TABS tablet TAKE ONE TABLET BY MOUTH TWICE DAILY   exemestane (AROMASIN) 25 mg, Oral, Daily after breakfast   furosemide (LASIX) 20 mg, Oral, Daily   Lactobacillus (PROBIOTIC ACIDOPHILUS PO) 1 tablet, Oral, Daily   metoprolol succinate (TOPROL-XL) 25 mg, Oral, Daily, Take with or immediately following a meal.   potassium chloride (KLOR-CON) 10 MEQ tablet 10 mEq, Oral, 2 times daily   Propylene Glycol 0.6 % SOLN 1 drop, Both Eyes, Daily at bedtime, systane eye drops   rosuvastatin (CRESTOR) 10 mg, Oral, Daily at bedtime   sacubitril-valsartan (ENTRESTO) 49-51 MG 1 tablet, Oral, 2 times daily   Vitamin D 2,000 Units, Oral, Daily       Objective:   Physical Exam BP 138/86  Pulse 83   Temp 97.9 F (36.6 C) (Oral)   Resp 18   Ht '5\' 7"'$  (1.702 m)   Wt 209 lb 6 oz (95 kg)   SpO2 98%   BMI 32.79 kg/m  General:   Well developed, NAD, BMI noted. HEENT:  Normocephalic . Face symmetric, atraumatic Lungs:  CTA B Normal respiratory effort, no intercostal retractions, no accessory muscle use. Heart: RRR,  no murmur.  Lower extremities: no pretibial edema bilaterally  Skin: Not pale. Not jaundice Neurologic:  alert & oriented X3.  Speech normal, gait appropriate for age and unassisted Psych--  Cognition and judgment appear intact.  Cooperative with normal attention span and concentration.  Behavior appropriate. No  anxious or depressed appearing.      Assessment    Assessment (new patient 07-2022, previous PCP Dr. Lavone Orn retired) Prediabetes HTN High cholesterol CV: -- CHF  -- Atrial fibrillation dx ~ 2016 -Cardioversion 12/31/2021. OSA, on CPAP dx 2021 Anxiety (chronic xanax prn) History of R breast cancer (+ lumpectomy April 2023, no chemo, no XRT) DJD  Dermatology: Dr. Nevada Crane   PLAN Pre diabetes: Check A1c HTN: Reports very good ambulatory BPs, currently on amlodipine, diltiazem, Lasix, metoprolol, potassium, Entresto.  Check BMP. High cholesterol: On Crestor, last LFTs normal, check FLP CHF, A-fib: Had an ablation 09/10/2022, saw cardiology 10/10/2022, was in sinus rhythm at the time, seems to be in NSR today as well.  Was recommended to stay on Eliquis. DJD: Wonders the Voltaren gel is safe to take, okay to continue with it. Breast cancer: Saw oncology 08/16/2022.   Was having some side effects from anastrozole Anxiety, takes Xanax at night as needed, RF sent, PDMP was okay. Vaccinations reviewed RTC 4 to 5 months

## 2022-10-23 NOTE — Patient Instructions (Addendum)
Vaccines I recommend:  Covid booster RSV vaccine  Check the  blood pressure regularly BP GOAL is between 110/65 and  135/85. If it is consistently higher or lower, let me know    GO TO THE LAB : Get the blood work     [place], [place] back for checkup in 4 to 5 months

## 2022-10-24 NOTE — Assessment & Plan Note (Signed)
Tdap 2016 Shingrix x 2 PNM 13: 2015, PNM 23: 2012. RSV recommended COVID-vaccine:recommended  Flu shot: Up-to-date Healthcare POA: Charted DEXA 05/30/2022: Normal

## 2022-10-24 NOTE — Assessment & Plan Note (Signed)
Pre diabetes: Check A1c HTN: Reports very good ambulatory BPs, currently on amlodipine, diltiazem, Lasix, metoprolol, potassium, Entresto.  Check BMP. High cholesterol: On Crestor, last LFTs normal, check FLP CHF, A-fib: Had an ablation 09/10/2022, saw cardiology 10/10/2022, was in sinus rhythm at the time, seems to be in NSR today as well.  Was recommended to stay on Eliquis. DJD: Wonders the Voltaren gel is safe to take, okay to continue with it. Breast cancer: Saw oncology 08/16/2022.   Was having some side effects from anastrozole Anxiety, takes Xanax at night as needed, RF sent, PDMP was okay. Vaccinations reviewed RTC 4 to 5 months

## 2022-10-28 NOTE — Progress Notes (Addendum)
Cardiology Clinic Note   Patient Name: Shannon Liu Date of Encounter: 10/29/2022  Primary Care Provider:  Colon Branch, MD Primary Cardiologist:  Candee Furbish, MD  Patient Profile    Shannon Liu is a 82 y.o. female with a past medical history of PAF s/p ablation 2023 on anticoagulation, chronic HFrEF, pericardial effusion, hypertension, hyperlipidemia, OSA who presents to the clinic today for 78-monthfollow-up of chronic cardiac conditions.   Past Medical History    Past Medical History:  Diagnosis Date   Anxiety    Arthritis    Cancer ([facility] 2023   right breast   Cataract    CHF (congestive heart failure) ([facility]    Dysrhythmia    A fib   Heart murmur    HOH (hard of hearing)    Hyperlipidemia    Hypertension    PAF (paroxysmal atrial fibrillation) ([place]    a. dx on 06/2015 admission. spontaneously converted into NSR. placed on Eliquis   Sleep apnea    CPAP   Past Surgical History:  Procedure Laterality Date   ABDOMINAL HYSTERECTOMY     ATRIAL FIBRILLATION ABLATION N/A 09/10/2022   Procedure: ATRIAL FIBRILLATION ABLATION;  Surgeon: CConstance Haw MD;  [facility];  Service: Cardiovascular;  Laterality: N/A;   BREAST CYST EXCISION Left    BREAST LUMPECTOMY WITH RADIOACTIVE SEED LOCALIZATION Right 01/31/2022   Procedure: RIGHT BREAST LUMPECTOMY WITH RADIOACTIVE SEED LOCALIZATION;  Surgeon: CErroll Luna MD;  [facility]  Service: General;  Laterality: Right;   BREAST SURGERY Left    lumpectomy 35 yrs ago   CARDIOVERSION N/A 12/31/2021   Procedure: CARDIOVERSION;  Surgeon: SDonato Heinz MD;  [facility];  Service: Cardiovascular;  Laterality: N/A;   DILATION AND CURETTAGE OF UTERUS     NECK SURGERY     discectomy with cadaver bone   TEE WITHOUT CARDIOVERSION N/A 12/31/2021   Procedure: TRANSESOPHAGEAL ECHOCARDIOGRAM (TEE);  Surgeon: SDonato Heinz MD;  [facility];  Service: Cardiovascular;   Laterality: N/A;   TONSILLECTOMY     removed as a child   TUBAL LIGATION      Allergies  Allergies  Allergen Reactions   Amlodipine Besylate Other (See Comments)     Edema Taking 2.5 mg with no problems   Buspirone     Other reaction(s): miserable   Fluoxetine     Other reaction(s): insomnia   Morphine And Related Nausea And Vomiting   Penicillins Nausea And Vomiting   Sertraline Hcl     Other reaction(s): nightmares, sexual disfunction    History of Present Illness    Shannon ISKHAKOVhas a past medical history of: PAF. 12/31/2021: Successful cardioversion. 14-day ZIO 01/30/2022: Brief episodes of atrial tachycardia, frequent PACs 9%, occasional PVCs 4%.  No evidence of A-fib. Coronary CT 09/03/2022: Normal pulmonary vein drainage into the left atrium, no pulmonary vein stenosis.  Normal LAA with no thrombus.  No intracardiac mass or thrombus.  Coronary artery calcium score 364 (73rd percentile).  Mild dilation of main pulmonary artery suggestive of increased pulmonary pressure.  Small superior and posterior pericardial effusion. A-fib ablation on 09/10/2022: Successful electrical isolation and end; circling of all 4 pulmonary veins with radiofrequency current. Chronic HFrEF. Echo 12/27/2021: EF 40 to 45%.  Global hypokinesis.  Mild LVH.  RV systolic function mildly reduced.  Mildly elevated pulmonary artery systolic pressure.  Mild LAE.  Moderate RAE.  Small pericardial effusion.  Mild MR.  Mild  to moderate TR.  RA pressure 15 mmHg. Pericardial effusion. Hypertension. Hyperlipidemia. Lipid panel 10/20/2022: LDL 74, HDL 51, TG 180, total 161. OSA. CPAP 100% adherence.  Breast CA.   Shannon Liu was first evaluated by cardiology during an ED visit for chest discomfort and found to be in A-fib with RVR on 06/23/2015.  She converted to sinus rhythm on Cardizem drip.  Echo from 06/24/2015 showed EF 45 to 50%, moderate LVH, Grade I DD.  Nuclear stress test 07/06/2015 as an outpatient was a low  risk study without evidence of ischemia or infarction; EF 70%.  Patient had done well with intermittent episodes of afib but stable on Cardizem 120 mg and Eliquis. In March 2023, patient was undergoing preop evaluation for breast surgery for breast CA when patient was found to be in A-fib with RVR.  Heart rate persistently stayed elevated despite esmolol and metoprolol so she was sent to [facility] ED for further evaluation.  At that time patient reported a several week history of heart racing with constant palpitations for 2 weeks.  Rate was unable to be controlled so on 12/31/2021 patient had successful cardioversion.  Patient underwent successful atrial fibrillation ablation on 09/10/2022.  Patient was last seen in A-fib clinic on 10/10/2022.  Patient was maintaining sinus rhythm at that time.  Her BP was elevated and she was encouraged to log BP and follow-up as scheduled in 1 month.  Today, patient is doing well. She denies shortness of breath or dyspnea on exertion. No chest pain, pressure, or tightness. Denies orthopnea, or PND. Mild edema at times particularly at the end of the day but normal in the morning. Her weight is stable at home.   She reports occasional palpitations that are described as very brief fluttering she notices mainly at night in bed. They do not last long but she noticed if she puts on her CPAP they will stop immediately. Patient exercises twice a week at her church walking and doing a group exercise tape. Patient is hypertensive today in the office at 166/82 at intake and 138/82 at recheck. She reports her BP is always high in the office but at home it runs around 130/60-70.    Home Medications    Current Meds  Medication Sig   acetaminophen (TYLENOL) 500 MG tablet Take 500 mg by mouth every 6 (six) hours as needed for mild pain.   ALPRAZolam (XANAX) 0.25 MG tablet Take 1 tablet (0.25 mg total) by mouth at bedtime as needed for anxiety.   amLODipine (NORVASC) 2.5 MG tablet  Take 1 tablet (2.5 mg total) by mouth daily.   Cholecalciferol (VITAMIN D) 50 MCG (2000 UT) CAPS Take 2,000 Units by mouth daily.   clindamycin (CLINDAGEL) 1 % gel Apply topically 2 (two) times daily.   diltiazem (CARDIZEM) 30 MG tablet Take 1 tablet every 4 hours AS NEEDED for AFIB heart rate >100   ELIQUIS 5 MG TABS tablet TAKE ONE TABLET BY MOUTH TWICE DAILY   exemestane (AROMASIN) 25 MG tablet Take 1 tablet (25 mg total) by mouth daily after breakfast.   furosemide (LASIX) 20 MG tablet Take 1 tablet (20 mg total) by mouth daily.   Lactobacillus (PROBIOTIC ACIDOPHILUS PO) Take 1 tablet by mouth daily.   metoprolol succinate (TOPROL-XL) 25 MG 24 hr tablet Take 1 tablet (25 mg total) by mouth daily. Take with or immediately following a meal.   potassium chloride (KLOR-CON) 10 MEQ tablet Take 1 tablet (10 mEq total) by mouth 2 (  two) times daily.   Propylene Glycol 0.6 % SOLN Place 1 drop into both eyes at bedtime. systane eye drops   rosuvastatin (CRESTOR) 10 MG tablet Take 1 tablet (10 mg total) by mouth at bedtime.   sacubitril-valsartan (ENTRESTO) 49-51 MG Take 1 tablet by mouth 2 (two) times daily.    Family History    Family History  Problem Relation Age of Onset   Heart attack Mother 65   CVA Mother 36   Unexplained death Father 69   Hypertension Sister    Hypertension Brother    Colon cancer Maternal Grandfather    Prostate cancer Son 3   Breast cancer Neg Hx    She indicated that her mother is deceased. She indicated that her father is deceased. She indicated that the status of her sister is unknown. She indicated that the status of her brother is unknown. She indicated that her maternal grandmother is deceased. She indicated that her maternal grandfather is deceased. She indicated that her paternal grandmother is deceased. She indicated that her paternal grandfather is deceased. She indicated that her son is alive. She indicated that the status of her neg hx is  unknown.   Social History    Social History   Socioeconomic History   Marital status: Widowed    Spouse name: Not on file   Number of children: 2   Years of education: Not on file   Highest education level: Not on file  Occupational History   Occupation: Retired- [employer]  Tobacco Use   Smoking status: Never   Smokeless tobacco: Never  Vaping Use   Vaping Use: Never used  Substance and Sexual Activity   Alcohol use: No    Alcohol/week: 0.0 standard drinks of alcohol   Drug use: No   Sexual activity: Not Currently  Other Topics Concern   Not on file  [place], near [place].    Daughter currently lives with her.   Social Determinants of Health   Financial Resource Strain: Low Risk  (08/15/2022)   Overall Financial Resource Strain (CARDIA)    Difficulty of Paying Living Expenses: Not hard at all  Food Insecurity: No Food Insecurity (08/15/2022)   Hunger Vital Sign    Worried About Running Out of Food in the Last Year: Never true    Ran Out of Food in the Last Year: Never true  Transportation Needs: No Transportation Needs (08/15/2022)   PRAPARE - Hydrologist (Medical): No    Lack of Transportation (Non-Medical): No  Physical Activity: Insufficiently Active (08/15/2022)   Exercise Vital Sign    Days of Exercise per Week: 2 days    Minutes of Exercise per Session: 60 min  Stress: No Stress Concern Present (08/15/2022)   [place]    Feeling of Stress : Not at all  Social Connections: Moderately Isolated (08/15/2022)   Social Connection and Isolation Panel [NHANES]    Frequency of Communication with Friends and Family: More than three times a week    Frequency of Social Gatherings with Friends and Family: More than three times a week    Attends Religious Services: More than 4 times per year    Active Member  of [employer] or Organizations: No    Attends Archivist Meetings: Never    Marital Status: Widowed  Intimate Partner Violence: Not At Risk (08/15/2022)  Humiliation, Afraid, Rape, and Kick questionnaire    Fear of Current or Ex-Partner: No    Emotionally Abused: No    Physically Abused: No    Sexually Abused: No     Review of Systems    General: No chills, fever, night sweats or weight changes.  Cardiovascular:  No chest pain, dyspnea on exertion, edema, orthopnea, palpitations, paroxysmal nocturnal dyspnea. Dermatological: No rash, lesions/masses Respiratory: No cough, dyspnea Urologic: No hematuria, dysuria Abdominal:   No nausea, vomiting, diarrhea, bright red blood per rectum, melena, or hematemesis Neurologic:  No visual changes, weakness, changes in mental status. All other systems reviewed and are otherwise negative except as noted above.  Physical Exam    VS:  BP (!) 166/82   Pulse 64   Ht 5' 6"$  (1.676 m)   Wt 211 lb 9.6 oz (96 kg)   SpO2 98%   BMI 34.15 kg/m  , BMI Body mass index is 34.15 kg/m. GEN:  Well nourished, well developed, in no acute distress. HEENT: Normal. Neck: Supple, no JVD, carotid bruits, or masses. Cardiac: RRR, no murmurs, rubs, or gallops. No clubbing, cyanosis, edema.  Radials/DP/PT 2+ and equal bilaterally.  Respiratory:  Respirations regular and unlabored, clear to auscultation bilaterally. GI: Soft, nontender, nondistended. MS: No deformity or atrophy. Skin: Warm and dry, no rash. Neuro: Strength and sensation are intact. Psych: Normal affect.  Accessory Clinical Findings    Recent Labs: 12/26/2021: TSH 0.819 12/28/2021: Magnesium 2.1 08/16/2022: ALT 12; Hemoglobin 14.0; Platelet Count 215 10/23/2022: BUN 19; Creatinine, Ser 0.74; Potassium 4.6; Sodium 143   Recent Lipid Panel    Component Value Date/Time   CHOL 161 10/23/2022 1145   TRIG 180.0 (H) 10/23/2022 1145   HDL 51.30 10/23/2022 1145   CHOLHDL 3 10/23/2022 1145    VLDL 36.0 10/23/2022 1145   LDLCALC 74 10/23/2022 1145     ECG is not indicated today.    CHA2DS2-VASc Score = 5   This indicates a 7.2% annual risk of stroke. The patient's score is based upon: CHF History: 1 HTN History: 1 Diabetes History: 0 Stroke History: 0 Vascular Disease History: 0 Age Score: 2 Gender Score: 1      Assessment & Plan   PAF.  S/p A-fib ablation November 2023. She is maintaining sinus rhythm.  Patient reports brief palpations occasionally at night. Continue metoprolol and Eliquis. Chronic diastolic heart failure.  Echo March 2023 EF 40 to 45%.  Patient denies shortness of breath or DOE. She exercises twice a week. She reports mild edema at times particiularly at the end of the day but normal in the morning. Weight is stable at home.  Euvolemic and well compensated on exam. Clear breath sounds bilaterally. She is encouraged to wear compression socks and elevate legs when possible. Continue metoprolol Entresto, and Lasix. Will get repeat Echo.  Hypertension.  BP today 166/82 on intake and 138/82 at recheck. Home BP usually 130/60-70. She states it is always high in the office.  Patient denies headaches or dizziness.  Continue amlodipine, metoprolol, and Entresto. Hyperlipidemia.  LDL January 2024 74, at goal.  Continue Crestor.  Followed by PCP.  Disposition: Repeat echo. Return in 6 months or sooner as needed.    Justice Britain. Momodou Consiglio, DNP, NP-C     10/29/2022, 8:55 AM [place] [address] Office [phone] Fax [phone]

## 2022-10-29 ENCOUNTER — Encounter: Payer: Self-pay | Admitting: Student

## 2022-10-29 ENCOUNTER — Ambulatory Visit: Payer: Medicare PPO | Attending: Cardiology | Admitting: Student

## 2022-10-29 VITALS — BP 138/82 | HR 64 | Ht 66.0 in | Wt 211.6 lb

## 2022-10-29 DIAGNOSIS — Z9889 Other specified postprocedural states: Secondary | ICD-10-CM | POA: Diagnosis not present

## 2022-10-29 DIAGNOSIS — I480 Paroxysmal atrial fibrillation: Secondary | ICD-10-CM | POA: Diagnosis not present

## 2022-10-29 DIAGNOSIS — I5022 Chronic systolic (congestive) heart failure: Secondary | ICD-10-CM | POA: Diagnosis not present

## 2022-10-29 DIAGNOSIS — Z8679 Personal history of other diseases of the circulatory system: Secondary | ICD-10-CM

## 2022-10-29 DIAGNOSIS — E7800 Pure hypercholesterolemia, unspecified: Secondary | ICD-10-CM

## 2022-10-29 DIAGNOSIS — I10 Essential (primary) hypertension: Secondary | ICD-10-CM

## 2022-10-29 NOTE — Patient Instructions (Addendum)
Medication Instructions:  Your physician recommends that you continue on your current medications as directed. Please refer to the Current Medication list given to you today.  *If you need a refill on your cardiac medications before your next appointment, please call your pharmacy*   Lab Work: None ordered  If you have labs (blood work) drawn today and your tests are completely normal, you will receive your results only by: [place] (if you have MyChart) OR A paper copy in the mail If you have any lab test that is abnormal or we need to change your treatment, we will call you to review the results.   Testing/Procedures: Your physician has requested that you have an echocardiogram. Echocardiography is a painless test that uses sound waves to create images of your heart. It provides your doctor with information about the size and shape of your heart and how well your heart's chambers and valves are working. This procedure takes approximately one hour. There are no restrictions for this procedure. Please do NOT wear cologne, perfume, aftershave, or lotions (deodorant is allowed). Please arrive 15 minutes prior to your appointment time.    Follow-Up: At [facility] you and your health needs are our priority.  As part of our continuing mission to provide you with exceptional heart care, we have created designated Provider Care Teams.  These Care Teams include your primary Cardiologist (physician) and Advanced Practice Providers (APPs -  Physician Assistants and Nurse Practitioners) who all work together to provide you with the care you need, when you need it.  We recommend signing up for the patient portal called "MyChart".  Sign up information is provided on this After Visit Summary.  MyChart is used to connect with patients for Virtual Visits (Telemedicine).  Patients are able to view lab/test results, encounter notes, upcoming appointments, etc.  Non-urgent messages can be  sent to your provider as well.   To learn more about what you can do with MyChart, go to [email or web address].    Your next appointment:   6 month(s) ALREADY SCHEDULED   Provider:   Candee Furbish, MD     Other Instructions

## 2022-10-31 ENCOUNTER — Ambulatory Visit: Payer: Medicare PPO | Admitting: Cardiology

## 2022-11-06 ENCOUNTER — Encounter: Payer: Self-pay | Admitting: Internal Medicine

## 2022-11-08 ENCOUNTER — Ambulatory Visit
Admission: RE | Admit: 2022-11-08 | Discharge: 2022-11-08 | Disposition: A | Discharge status: Home / Self Care | Payer: Medicare PPO | Source: Ambulatory Visit | Attending: Adult Health | Admitting: Adult Health

## 2022-11-08 DIAGNOSIS — Z170 Estrogen receptor positive status [ER+]: Secondary | ICD-10-CM

## 2022-11-13 ENCOUNTER — Encounter: Payer: Self-pay | Admitting: Hematology

## 2022-11-21 ENCOUNTER — Encounter (HOSPITAL_COMMUNITY): Payer: Self-pay | Admitting: *Deleted

## 2022-11-26 ENCOUNTER — Ambulatory Visit (HOSPITAL_COMMUNITY): Payer: Medicare PPO | Attending: Student

## 2022-11-26 DIAGNOSIS — I5022 Chronic systolic (congestive) heart failure: Secondary | ICD-10-CM

## 2022-11-26 LAB — ECHOCARDIOGRAM COMPLETE
Area-P 1/2: 1.96 cm2
S' Lateral: 2.7 cm

## 2022-11-29 ENCOUNTER — Encounter: Payer: Self-pay | Admitting: Internal Medicine

## 2022-11-29 MED ORDER — CLINDAMYCIN PHOSPHATE 1 % EX GEL: Freq: Two times a day (BID) | CUTANEOUS | 1 refills | Status: DC

## 2022-11-29 NOTE — Telephone Encounter (Signed)
Send a refill for clindamycin cream

## 2022-11-29 NOTE — Addendum Note (Signed)
Addended byDamita Dunnings D on: 11/29/2022 10:34 AM   Modules accepted: Orders

## 2022-11-29 NOTE — Telephone Encounter (Signed)
Rx sent 

## 2022-11-30 ENCOUNTER — Other Ambulatory Visit: Payer: Self-pay | Admitting: Cardiology

## 2022-11-30 ENCOUNTER — Other Ambulatory Visit: Payer: Self-pay | Admitting: Internal Medicine

## 2022-12-18 ENCOUNTER — Encounter: Payer: Self-pay | Admitting: Cardiology

## 2022-12-18 ENCOUNTER — Ambulatory Visit: Payer: Medicare PPO | Attending: Cardiology | Admitting: Cardiology

## 2022-12-18 VITALS — BP 150/82 | HR 67 | Ht 66.0 in | Wt 209.0 lb

## 2022-12-18 DIAGNOSIS — D6869 Other thrombophilia: Secondary | ICD-10-CM | POA: Diagnosis not present

## 2022-12-18 DIAGNOSIS — I5022 Chronic systolic (congestive) heart failure: Secondary | ICD-10-CM

## 2022-12-18 DIAGNOSIS — I480 Paroxysmal atrial fibrillation: Secondary | ICD-10-CM

## 2022-12-18 NOTE — Patient Instructions (Signed)
Medication Instructions:  Your physician recommends that you continue on your current medications as directed. Please refer to the Current Medication list given to you today.  *If you need a refill on your cardiac medications before your next appointment, please call your pharmacy*   Lab Work: None ordered   Testing/Procedures: None ordered   Follow-Up: At [facility], you and your health needs are our priority.  As part of our continuing mission to provide you with exceptional heart care, we have created designated Provider Care Teams.  These Care Teams include your primary Cardiologist (physician) and Advanced Practice Providers (APPs -  Physician Assistants and Nurse Practitioners) who all work together to provide you with the care you need, when you need it.   Your next appointment:   6 month(s)  The format for your next appointment:   In Person  Provider:   You will follow up in the [facility] located at [facility]. Your provider will be: Luther Bradley, PA    Thank you for choosing [facility]!!   Trinidad Curet, RN [phone]  Other Instructions

## 2022-12-18 NOTE — Progress Notes (Signed)
Electrophysiology Office Note   Date:  12/18/2022   ID:  Ezell, Ayer 19-Apr-1941, MRN [id number]  PCP:  Colon Branch, MD  Cardiologist:  Marlou Porch Primary Electrophysiologist:  Kal Chait Meredith Leeds, MD    Chief Complaint: AF   History of Present Illness: Shannon Liu is a 82 y.o. female who is being seen today for the evaluation of AF at the request of Colon Branch, MD. Presenting today for electrophysiology evaluation.  She has a history significant for atrial fibrillation, hypertension, hyperlipidemia, OSA on CPAP, breast cancer.  She has had multiple episodes of atrial fibrillation over the last few months.  She has mild systolic heart failure by her echo with an ejection fraction of 40 to 45% previously.  Unfortunately she had more frequent episodes of atrial fibrillation.  Heart rates were in the 160s at times.  She had a cardioversion, but continued to have intermittent short episodes.  She is s/p ablation on 09/10/22 and did well without complication.  Seen in the Afib clinic for 1 month f/u on 10/10/22 and was in [place]. She was doing well with interim healing of leg incision sites and no episodes of AF. Compliant with anticoagulation.   Most recent echocardiogram on 11/26/22 showed EF 45-50%.  On follow up today, she is doing well. She denies any episodes of AF. She feels like she has more energy and no longer has feeling of irregular heartbeat at night. She is exercising with friends 2 days per week. Compliant with anticoagulation. No bleeding concerns.  Today, she denies symptoms of palpitations, chest pain, shortness of breath, orthopnea, PND, lower extremity edema, claudication, dizziness, presyncope, syncope, bleeding, or neurologic sequela. The patient is tolerating medications without difficulties.    Past Medical History:  Diagnosis Date   Anxiety    Arthritis    Cancer ([place]) 2023   right breast   Cataract    CHF (congestive heart failure) ([place])    Dysrhythmia     A fib   Heart murmur    HOH (hard of hearing)    Hyperlipidemia    Hypertension    PAF (paroxysmal atrial fibrillation) ([place])    a. dx on 06/2015 admission. spontaneously converted into NSR. placed on Eliquis   Sleep apnea    CPAP   Past Surgical History:  Procedure Laterality Date   ABDOMINAL HYSTERECTOMY     ATRIAL FIBRILLATION ABLATION N/A 09/10/2022   Procedure: ATRIAL FIBRILLATION ABLATION;  Surgeon: Constance Haw, MD;  Location: [facility];  Service: Cardiovascular;  Laterality: N/A;   BREAST BIOPSY Right 12/06/2021   BREAST CYST EXCISION Left    BREAST LUMPECTOMY Right 01/31/2022   BREAST LUMPECTOMY WITH RADIOACTIVE SEED LOCALIZATION Right 01/31/2022   Procedure: RIGHT BREAST LUMPECTOMY WITH RADIOACTIVE SEED LOCALIZATION;  Surgeon: Erroll Luna, MD;  Location: [facility];  Service: General;  Laterality: Right;   BREAST SURGERY Left    lumpectomy 35 yrs ago   CARDIOVERSION N/A 12/31/2021   Procedure: CARDIOVERSION;  Surgeon: Donato Heinz, MD;  Location: [facility] ENDOSCOPY;  Service: Cardiovascular;  Laterality: N/A;   DILATION AND CURETTAGE OF UTERUS     NECK SURGERY     discectomy with cadaver bone   TEE WITHOUT CARDIOVERSION N/A 12/31/2021   Procedure: TRANSESOPHAGEAL ECHOCARDIOGRAM (TEE);  Surgeon: Donato Heinz, MD;  Location: [facility] ENDOSCOPY;  Service: Cardiovascular;  Laterality: N/A;   TONSILLECTOMY     removed as a child   TUBAL LIGATION  Current Outpatient Medications  Medication Sig Dispense Refill   acetaminophen (TYLENOL) 500 MG tablet Take 500 mg by mouth every 6 (six) hours as needed for mild pain.     ALPRAZolam (XANAX) 0.25 MG tablet Take 1 tablet (0.25 mg total) by mouth at bedtime as needed for anxiety. 30 tablet 0   amLODipine (NORVASC) 2.5 MG tablet Take 1 tablet (2.5 mg total) by mouth daily. 90 tablet 1   Cholecalciferol (VITAMIN D) 50 MCG (2000 UT) CAPS Take 2,000 Units by mouth daily.     clindamycin (CLINDAGEL) 1 %  gel Apply topically 2 (two) times daily. 60 g 1   diltiazem (CARDIZEM) 30 MG tablet Take 1 tablet every 4 hours AS NEEDED for AFIB heart rate >100 45 tablet 3   ELIQUIS 5 MG TABS tablet TAKE ONE TABLET BY MOUTH TWICE DAILY 180 tablet 1   furosemide (LASIX) 20 MG tablet Take 1 tablet (20 mg total) by mouth daily.     Lactobacillus (PROBIOTIC ACIDOPHILUS PO) Take 1 tablet by mouth daily.     metoprolol succinate (TOPROL-XL) 25 MG 24 hr tablet TAKE ONE TABLET BY MOUTH ONCE DAILY. take WITH OR immediately following A meal 90 tablet 3   potassium chloride (KLOR-CON) 10 MEQ tablet TAKE ONE TABLET BY MOUTH TWICE DAILY 180 tablet 1   Propylene Glycol 0.6 % SOLN Place 1 drop into both eyes at bedtime. systane eye drops     rosuvastatin (CRESTOR) 10 MG tablet Take 1 tablet (10 mg total) by mouth at bedtime. 90 tablet 1   sacubitril-valsartan (ENTRESTO) 49-51 MG Take 1 tablet by mouth 2 (two) times daily. 180 tablet 3   exemestane (AROMASIN) 25 MG tablet Take 1 tablet (25 mg total) by mouth daily after breakfast. (Patient not taking: Reported on 12/18/2022) 30 tablet 3   No current facility-administered medications for this visit.    Allergies:   Amlodipine besylate, Buspirone, Fluoxetine, Morphine and related, Penicillins, and Sertraline hcl   Social History:  The patient  reports that she has never smoked. She has never used smokeless tobacco. She reports that she does not drink alcohol and does not use drugs.   Family History:  The patient's family history includes CVA (age of onset: 38) in her mother; Colon cancer in her maternal grandfather; Heart attack (age of onset: 6) in her mother; Hypertension in her brother and sister; Prostate cancer (age of onset: 57) in her son; Unexplained death (age of onset: 78) in her father.    ROS:  Please see the history of present illness.   Otherwise, review of systems is positive for none.   All other systems are reviewed and negative.    PHYSICAL EXAM: VS:  BP  (!) 150/82   Pulse 67   Ht '5\' 6"'$  (1.676 m)   Wt 209 lb (94.8 kg)   SpO2 98%   BMI 33.73 kg/m  , BMI Body mass index is 33.73 kg/m. GEN: Well nourished, well developed, in no acute distress  HEENT: normal  Neck: no JVD, carotid bruits, or masses Cardiac: RRR; no murmurs, rubs, or gallops,no edema  Respiratory:  clear to auscultation bilaterally, normal work of breathing GI: soft, nontender, nondistended, + BS MS: no deformity or atrophy  Skin: warm and dry Neuro:  Strength and sensation are intact Psych: euthymic mood, full affect  EKG:  EKG is ordered today. Personal review of the ekg ordered shows sinus rhythm with sinus arrhythmia HR 67, PVC  Recent Labs: 12/26/2021: TSH 0.819  12/28/2021: Magnesium 2.1 08/16/2022: ALT 12; Hemoglobin 14.0; Platelet Count 215 10/23/2022: BUN 19; Creatinine, Ser 0.74; Potassium 4.6; Sodium 143    Lipid Panel     Component Value Date/Time   CHOL 161 10/23/2022 1145   TRIG 180.0 (H) 10/23/2022 1145   HDL 51.30 10/23/2022 1145   CHOLHDL 3 10/23/2022 1145   VLDL 36.0 10/23/2022 1145   LDLCALC 74 10/23/2022 1145     Wt Readings from Last 3 Encounters:  12/18/22 209 lb (94.8 kg)  10/29/22 211 lb 9.6 oz (96 kg)  10/23/22 209 lb 6 oz (95 kg)      Other studies Reviewed: Additional studies/ records that were reviewed today include: TTE 11/26/22  Review of the above records today demonstrates:   1. Left ventricular ejection fraction, by estimation, is 45 to 50%. The  left ventricle has mildly decreased function. The left ventricle  demonstrates global hypokinesis. Left ventricular diastolic parameters are  consistent with Grade I diastolic  dysfunction (impaired relaxation).   2. Right ventricular systolic function is normal. The right ventricular  size is normal. There is normal pulmonary artery systolic pressure.   3. The mitral valve is normal in structure. Trivial mitral valve  regurgitation. No evidence of mitral stenosis.   4. The  aortic valve was not well visualized. Aortic valve regurgitation  is not visualized. No aortic stenosis is present.   5. Aortic dilatation noted. There is mild dilatation of the ascending  aorta, measuring 37 mm.   6. The inferior vena cava is normal in size with greater than 50%  respiratory variability, suggesting right atrial pressure of 3 mmHg.    Cardiac monitor 01/30/2022 personally reviewed Sinus rhythm average heart rate 52 bpm. Brief episodes of atrial tachycardia noted. PACs-9%, frequent Occasional PVCs-4% No evidence of atrial fibrillation noted. Overall reassuring. Continue with diltiazem and metoprolol.  ASSESSMENT AND PLAN:  1.  Paroxysmal atrial fibrillation: Status post ablation 09/10/22. CHA2DS2-VASc of at least 4. She remains on anticoagulation.  She is in SR today and doing well overall since ablation. No further episodes of AF. She remains on Toprol 25 mg daily and Eliquis 5 mg BID. We Belkis Norbeck not make any changes today and continue with her current medication regimen.  2.  Hypertension: Noted to be elevated today; advised to monitor BP at home.  3.  Chronic systolic heart failure: Currently on medical therapy per primary cardiology.  4.  Obstructive sleep apnea: CPAP compliance encouraged  5.  Secondary hypercoagulable state: Currently on Eliquis for atrial fibrillation as above.  Current medicines are reviewed at length with the patient today.   The patient does not have concerns regarding her medicines.  The following changes were made today:  none  Labs/ tests ordered today include:  Orders Placed This Encounter  Procedures   EKG 12-Lead    Disposition:   FU with Afib clinic in 6 months.  Signed, Iolani Twilley Meredith Leeds, MD  12/18/2022 2:50 PM     [place] [id number] [phone] (office) [phone] (fax)  I have seen and examined this patient with Emily Filbert.  Agree with above, note added to  reflect my findings.  Patient is post atrial fibrillation ablation.  Since ablation she has done well.  She has noted no further episodes of atrial fibrillation.  Quite happy with her control.  She feels quite a bit more energy and less fatigue or palpitations.  GEN: Well nourished, well developed, in no acute  distress  HEENT: normal  Neck: no JVD, carotid bruits, or masses Cardiac: RRR; no murmurs, rubs, or gallops,no edema  Respiratory:  clear to auscultation bilaterally, normal work of breathing GI: soft, nontender, nondistended, + BS MS: no deformity or atrophy  Skin: warm and dry Neuro:  Strength and sensation are intact Psych: euthymic mood, full affect   Paroxysmal atrial fibrillation: Currently on Eliquis and metoprolol.  CHA2DS2-VASc of 4.  Has had no further episodes of atrial fibrillation since ablation.  Happy with her control. Secondary hypercoagulable state: Currently on Eliquis for atrial fibrillation Chronic systolic heart failure: Ejection fraction improved with maintenance of sinus rhythm.  Management per primary cardiology.  Raney Antwine M. Brynja Marker MD 12/18/2022 2:50 PM

## 2022-12-28 ENCOUNTER — Emergency Department (HOSPITAL_BASED_OUTPATIENT_CLINIC_OR_DEPARTMENT_OTHER): Payer: Medicare PPO

## 2022-12-28 ENCOUNTER — Emergency Department (HOSPITAL_BASED_OUTPATIENT_CLINIC_OR_DEPARTMENT_OTHER)
Admission: EM | Admit: 2022-12-28 | Discharge: 2022-12-28 | Disposition: A | Discharge status: Home / Self Care | Payer: Medicare PPO | Attending: Emergency Medicine | Admitting: Emergency Medicine

## 2022-12-28 ENCOUNTER — Other Ambulatory Visit: Payer: Self-pay

## 2022-12-28 ENCOUNTER — Encounter (HOSPITAL_BASED_OUTPATIENT_CLINIC_OR_DEPARTMENT_OTHER): Payer: Self-pay | Admitting: Emergency Medicine

## 2022-12-28 DIAGNOSIS — I509 Heart failure, unspecified: Secondary | ICD-10-CM | POA: Diagnosis not present

## 2022-12-28 DIAGNOSIS — M1611 Unilateral primary osteoarthritis, right hip: Secondary | ICD-10-CM

## 2022-12-28 DIAGNOSIS — M25551 Pain in right hip: Secondary | ICD-10-CM | POA: Diagnosis present

## 2022-12-28 DIAGNOSIS — Z79899 Other long term (current) drug therapy: Secondary | ICD-10-CM | POA: Insufficient documentation

## 2022-12-28 DIAGNOSIS — I110 Hypertensive heart disease with heart failure: Secondary | ICD-10-CM | POA: Diagnosis not present

## 2022-12-28 DIAGNOSIS — Z7901 Long term (current) use of anticoagulants: Secondary | ICD-10-CM | POA: Diagnosis not present

## 2022-12-28 DIAGNOSIS — M5136 Other intervertebral disc degeneration, lumbar region: Secondary | ICD-10-CM | POA: Diagnosis not present

## 2022-12-28 MED ORDER — PREDNISONE 10 MG PO TABS: ORAL_TABLET | ORAL | 0 refills | Status: AC

## 2022-12-28 MED ORDER — ONDANSETRON 4 MG PO TBDP
4.0000 mg | ORAL_TABLET | Freq: Once | ORAL | Status: AC
  Administered 2022-12-28: 4 mg via ORAL
  Filled 2022-12-28: qty 1

## 2022-12-28 MED ORDER — OXYCODONE-ACETAMINOPHEN 5-325 MG PO TABS
1.0000 | ORAL_TABLET | Freq: Once | ORAL | Status: AC
  Administered 2022-12-28: 1 via ORAL
  Filled 2022-12-28: qty 1

## 2022-12-28 MED ORDER — OXYCODONE-ACETAMINOPHEN 5-325 MG PO TABS: 1.0000 | ORAL_TABLET | Freq: Four times a day (QID) | ORAL | 0 refills | Status: DC | PRN

## 2022-12-28 NOTE — ED Notes (Signed)
Pt ambulatory with standby by assist to restroom

## 2022-12-28 NOTE — ED Provider Notes (Signed)
[facility] Provider Note   CSN: [id number] Arrival date & time: 12/28/22  [id number]     History  Chief Complaint  Patient presents with   Leg Pain    Shannon Liu is a 82 y.o. female.   Leg Pain  Patient has a history of arthritis, paroxysmal A-fib, hypertension, CHF, hyperlipidemia.  She presents the ED with complaints of pain in her right hip and groin area.  Patient states symptoms started a couple weeks ago.  She was just trying to manage it on her own at home.  Pain is primarily in the right hip with certain movements that radiates more towards the groin.  She denies any pain in her back.  Occasionally does she does feel some discomfort in her lower leg but that is just a dull ache.  She denies any recent falls.  No fevers or chills.  She is not having abdominal pain.  No numbness or weakness.  No dysuria.  The pain does increase with trying to walk    Home Medications Prior to Admission medications   Medication Sig Start Date End Date Taking? Authorizing Provider  oxyCODONE-acetaminophen (PERCOCET/ROXICET) 5-325 MG tablet Take 1 tablet by mouth every 6 (six) hours as needed for severe pain. 12/28/22  Yes Dorie Rank, MD  predniSONE (DELTASONE) 10 MG tablet Take 4 tablets (40 mg total) by mouth daily with breakfast for 2 days, THEN 3 tablets (30 mg total) daily with breakfast for 2 days, THEN 2 tablets (20 mg total) daily with breakfast for 2 days, THEN 1 tablet (10 mg total) daily with breakfast for 2 days. 12/28/22 01/04/23 Yes Dorie Rank, MD  acetaminophen (TYLENOL) 500 MG tablet Take 500 mg by mouth every 6 (six) hours as needed for mild pain.    [provider]  ALPRAZolam Duanne Moron) 0.25 MG tablet Take 1 tablet (0.25 mg total) by mouth at bedtime as needed for anxiety. 10/23/22   Colon Branch, MD  amLODipine (NORVASC) 2.5 MG tablet Take 1 tablet (2.5 mg total) by mouth daily. 09/19/22   Jerline Pain, MD  Cholecalciferol (VITAMIN  D) 50 MCG (2000 UT) CAPS Take 2,000 Units by mouth daily.    [provider]  clindamycin (CLINDAGEL) 1 % gel Apply topically 2 (two) times daily. 11/29/22   Colon Branch, MD  diltiazem (CARDIZEM) 30 MG tablet Take 1 tablet every 4 hours AS NEEDED for AFIB heart rate >100 03/20/22   Swinyer, Lanice Schwab, NP  ELIQUIS 5 MG TABS tablet TAKE ONE TABLET BY MOUTH TWICE DAILY 09/02/22   Jerline Pain, MD  exemestane (AROMASIN) 25 MG tablet Take 1 tablet (25 mg total) by mouth daily after breakfast. Patient not taking: Reported on 12/18/2022 10/22/22   Alla Feeling, NP  furosemide (LASIX) 20 MG tablet Take 1 tablet (20 mg total) by mouth daily. 10/10/22   Sherran Needs, NP  Lactobacillus (PROBIOTIC ACIDOPHILUS PO) Take 1 tablet by mouth daily.    [provider]  metoprolol succinate (TOPROL-XL) 25 MG 24 hr tablet TAKE ONE TABLET BY MOUTH ONCE DAILY. take WITH OR immediately following A meal 12/02/22   Mayra Reel, NP  potassium chloride (KLOR-CON) 10 MEQ tablet TAKE ONE TABLET BY MOUTH TWICE DAILY 12/02/22   Camnitz, Ocie Doyne, MD  Propylene Glycol 0.6 % SOLN Place 1 drop into both eyes at bedtime. systane eye drops    [provider]  rosuvastatin (CRESTOR) 10 MG tablet Take  1 tablet (10 mg total) by mouth at bedtime. 09/11/22   Colon Branch, MD  sacubitril-valsartan (ENTRESTO) 49-51 MG Take 1 tablet by mouth 2 (two) times daily. 07/08/22   Jerline Pain, MD      Allergies    Amlodipine besylate, Buspirone, Fluoxetine, Morphine and related, Penicillins, and Sertraline hcl    Review of Systems   Review of Systems  Physical Exam Updated Vital Signs BP (!) 148/75   Pulse 60   Temp 98.2 F (36.8 C) (Oral)   Resp 18   Ht 1.676 m (5\' 6" )   Wt 93 kg   SpO2 95%   BMI 33.09 kg/m  Physical Exam Vitals and nursing note reviewed.  Constitutional:      General: She is not in acute distress.    Appearance: She is well-developed.  HENT:     Head: Normocephalic and  atraumatic.     Right Ear: External ear normal.     Left Ear: External ear normal.  Eyes:     General: No scleral icterus.       Right eye: No discharge.        Left eye: No discharge.     Conjunctiva/sclera: Conjunctivae normal.  Neck:     Trachea: No tracheal deviation.  Cardiovascular:     Rate and Rhythm: Normal rate.  Pulmonary:     Effort: Pulmonary effort is normal. No respiratory distress.     Breath sounds: No stridor.  Abdominal:     General: There is no distension.  Musculoskeletal:        General: No swelling or deformity.     Cervical back: Neck supple.     Lumbar back: No deformity or tenderness.     Right hip: Tenderness present.     Comments: Patient able to sit up without difficulty  Skin:    General: Skin is warm and dry.     Findings: No rash.  Neurological:     Mental Status: She is alert. Mental status is at baseline.     Cranial Nerves: No dysarthria or facial asymmetry.     Motor: No seizure activity.     Comments: Strength and sensation bilateral lower extremities, 5 out of 5 plantarflexion dorsiflexion strength     ED Results / Procedures / Treatments   Labs (all labs ordered are listed, but only abnormal results are displayed) Labs Reviewed - No data to display  EKG None  Radiology DG Hip Unilat W or Wo Pelvis 2-3 Views Right  Result Date: 12/28/2022 CLINICAL DATA:  hip pain radiating to groin and leg EXAM: DG HIP (WITH OR WITHOUT PELVIS) 2-3V RIGHT COMPARISON:  None Available. FINDINGS: There is no evidence of acute fracture. Alignment is normal. There is mild osteoarthritis of the hips. There are small sclerotic densities overlying the right intertrochanteric region, at least 1 of which is a likely bone island, the other others may be overlapping soft tissue calcifications. Severe degenerative changes of the lower lumbar spine. IMPRESSION: No evidence of acute fracture. Mild osteoarthritis of the right hip. Severe degenerative changes of the  lower lumbar spine. Electronically Signed   By: Maurine Simmering M.D.   On: 12/28/2022 10:53   DG Lumbar Spine Complete  Result Date: 12/28/2022 CLINICAL DATA:  hip pain radiating to groin and leg EXAM: LUMBAR SPINE - COMPLETE 4+ VIEW COMPARISON:  None Available. FINDINGS: Straightening of the lumbar lordosis. No evidence of lumbar spine fracture. There is multilevel degenerative disc disease, moderate-severe  from L1-L4 and severe at L4-L5 and L5-S1. Moderate-severe multilevel facet arthropathy. Dextroconvex lumbar curvature. Vascular calcifications. IMPRESSION: Moderate-severe multilevel degenerative disc disease and facet arthropathy, worst at L4-L5 and L5-S1. No evidence of lumbar spine fracture. Electronically Signed   By: Maurine Simmering M.D.   On: 12/28/2022 10:49    Procedures Procedures    Medications Ordered in ED Medications  oxyCODONE-acetaminophen (PERCOCET/ROXICET) 5-325 MG per tablet 1 tablet (1 tablet Oral Given 12/28/22 1011)  ondansetron (ZOFRAN-ODT) disintegrating tablet 4 mg (4 mg Oral Given 12/28/22 1011)    ED Course/ Medical Decision Making/ A&P Clinical Course as of 12/28/22 1127  Sat Dec 28, 2022  1110 X-rays show degenerative changes in the lumbar spine.  Patient also has evidence of mild osteoarthritis in the hip [JK]    Clinical Course User Index [JK] Dorie Rank, MD                             Medical Decision Making Problems Addressed: Degenerative disc disease, lumbar: acute illness or injury that poses a threat to life or bodily functions Osteoarthritis of right hip, unspecified osteoarthritis type: acute illness or injury  Amount and/or Complexity of Data Reviewed Radiology: ordered and independent interpretation performed.  Risk Prescription drug management.   To ED for evaluation of pain in her right hip.  It does radiate towards her groin area.  No recent trauma.  Patient's not having any abdominal pain.  No dysuria.  X-ray show arthritis in the hip but  this is mild.  She also has signs of severe degenerative changes in her spine.  It is possible the symptoms could be radicular in nature.  No acute neurologic deficits to require emergent MRI imaging.  Bursitis arthritis of the hip is also possibility.  No signs of acute infection.  Her symptoms improved with treatment in the ED.  Will discharge home with course of steroids and pain medication.  Caution patient on the use of oxycodone.  She will use it sparingly for more severe pain.  She has plans to follow-up with Dr. Rhona Raider this week.  Evaluation and diagnostic testing in the emergency department does not suggest an emergent condition requiring admission or immediate intervention beyond what has been performed at this time.  The patient is safe for discharge and has been instructed to return immediately for worsening symptoms, change in symptoms or any other concerns.        Final Clinical Impression(s) / ED Diagnoses Final diagnoses:  Degenerative disc disease, lumbar  Osteoarthritis of right hip, unspecified osteoarthritis type    Rx / DC Orders ED Discharge Orders          Ordered    oxyCODONE-acetaminophen (PERCOCET/ROXICET) 5-325 MG tablet  Every 6 hours PRN        12/28/22 1127    predniSONE (DELTASONE) 10 MG tablet  Q breakfast        12/28/22 1127              Dorie Rank, MD 12/28/22 1128

## 2022-12-28 NOTE — ED Triage Notes (Signed)
Pt arrives pov, to triage in wheelchair with c/o RT groin pain radiating to RT hip x 2 weeks. Pt denies injury, reports difficulty bearing weight. Took 2 tylenol arthritis at 0800 with no relief

## 2022-12-28 NOTE — Discharge Instructions (Signed)
Take Tylenol to help with pain.  The steroids will hopefully help reduce the inflammation causing the pain.  You can take Percocet for severe pain but use these sparingly.  Follow-up with the orthopedic doctor this week as planned

## 2022-12-28 NOTE — ED Notes (Signed)
Pt discharged to home. Discharge instructions have been discussed with patient and/or family members. Pt verbally acknowledges understanding d/c instructions, and endorses comprehension to checkout at registration before leaving.  °

## 2023-02-14 ENCOUNTER — Inpatient Hospital Stay: Payer: Medicare PPO | Attending: Adult Health

## 2023-02-14 ENCOUNTER — Inpatient Hospital Stay: Payer: Medicare PPO | Admitting: Adult Health

## 2023-02-14 ENCOUNTER — Encounter: Payer: Self-pay | Admitting: Adult Health

## 2023-02-14 VITALS — BP 161/80 | HR 63 | Resp 18 | Ht 66.0 in | Wt 211.0 lb

## 2023-02-14 DIAGNOSIS — Z170 Estrogen receptor positive status [ER+]: Secondary | ICD-10-CM

## 2023-02-14 DIAGNOSIS — C50411 Malignant neoplasm of upper-outer quadrant of right female breast: Secondary | ICD-10-CM | POA: Diagnosis not present

## 2023-02-14 DIAGNOSIS — Z79810 Long term (current) use of selective estrogen receptor modulators (SERMs): Secondary | ICD-10-CM | POA: Insufficient documentation

## 2023-02-14 LAB — CBC WITH DIFFERENTIAL (CANCER CENTER ONLY)
Abs Immature Granulocytes: 0.02 10*3/uL (ref 0.00–0.07)
Basophils Absolute: 0.1 10*3/uL (ref 0.0–0.1)
Basophils Relative: 1 %
Eosinophils Absolute: 0.2 10*3/uL (ref 0.0–0.5)
Eosinophils Relative: 4 %
HCT: 40.7 % (ref 36.0–46.0)
Hemoglobin: 13.7 g/dL (ref 12.0–15.0)
Immature Granulocytes: 0 %
Lymphocytes Relative: 29 %
Lymphs Abs: 1.5 10*3/uL (ref 0.7–4.0)
MCH: 30.4 pg (ref 26.0–34.0)
MCHC: 33.7 g/dL (ref 30.0–36.0)
MCV: 90.2 fL (ref 80.0–100.0)
Monocytes Absolute: 0.7 10*3/uL (ref 0.1–1.0)
Monocytes Relative: 14 %
Neutro Abs: 2.8 10*3/uL (ref 1.7–7.7)
Neutrophils Relative %: 52 %
Platelet Count: 208 10*3/uL (ref 150–400)
RBC: 4.51 MIL/uL (ref 3.87–5.11)
RDW: 13.4 % (ref 11.5–15.5)
WBC Count: 5.3 10*3/uL (ref 4.0–10.5)
nRBC: 0 % (ref 0.0–0.2)

## 2023-02-14 LAB — CMP (CANCER CENTER ONLY)
ALT: 13 U/L (ref 0–44)
AST: 19 U/L (ref 15–41)
Albumin: 4.1 g/dL (ref 3.5–5.0)
Alkaline Phosphatase: 100 U/L (ref 38–126)
Anion gap: 6 (ref 5–15)
BUN: 17 mg/dL (ref 8–23)
CO2: 27 mmol/L (ref 22–32)
Calcium: 9.3 mg/dL (ref 8.9–10.3)
Chloride: 108 mmol/L (ref 98–111)
Creatinine: 0.7 mg/dL (ref 0.44–1.00)
GFR, Estimated: >60 mL/min (ref >60)
Glucose, Bld: 111 mg/dL — ABNORMAL HIGH (ref 70–99)
Potassium: 4 mmol/L (ref 3.5–5.1)
Sodium: 141 mmol/L (ref 135–145)
Total Bilirubin: 0.9 mg/dL (ref 0.3–1.2)
Total Protein: 6.3 g/dL — ABNORMAL LOW (ref 6.5–8.1)

## 2023-02-14 NOTE — Assessment & Plan Note (Signed)
Shannon Liu is an 82 year old woman with stage Ia ER/PR positive right-sided breast cancer diagnosed in February 2023 status postlumpectomy followed by antiestrogen therapy which she has had difficulty tolerating.  #1 right breast cancer: She has no clinical or radiographic signs of breast cancer recurrence.  She is going to restart tamoxifen.  I recommended that she take 1/2 tablet daily and if she tolerates this increase to 1 tablet daily after a few weeks.  Her next mammogram is due in January 2025.  #2 bone health: Bone density testing in August 2023 is normal.  She will not need to repeat especially in light of her taking tamoxifen.  #3 health maintenance: She is recommended to continue seeing her primary care provider, healthy diet and exercise recommended.  We will see her back in 12 weeks for follow-up.

## 2023-02-14 NOTE — Progress Notes (Signed)
[facility] Cancer Follow up:    Shannon Plump, MD [address] [place] [address]   DIAGNOSIS:  Cancer Staging  Malignant neoplasm of upper-outer quadrant of right breast in female, estrogen receptor positive (HCC) Staging form: Breast, AJCC 8th Edition - Clinical stage from 12/06/2021: Stage IA (cT1b, cN0, cM0, G2, ER+, PR+, HER2-) - Signed by Malachy Mood, MD on 12/11/2021 Stage prefix: Initial diagnosis Histologic grading system: 3 grade system - Pathologic stage from 01/31/2022: Stage Unknown (pT1c, pNX, cM0, G2, ER+, PR+, HER2-) - Signed by Malachy Mood, MD on 02/12/2022 Stage prefix: Initial diagnosis Histologic grading system: 3 grade system Residual tumor (R): R0 - None   SUMMARY OF ONCOLOGIC HISTORY: Oncology History Overview Note   Cancer Staging  Malignant neoplasm of upper-outer quadrant of right breast in female, estrogen receptor positive (HCC) Staging form: Breast, AJCC 8th Edition - Clinical stage from 12/06/2021: Stage IA (cT1b, cN0, cM0, G2, ER+, PR+, HER2-) - Signed by Malachy Mood, MD on 12/11/2021 - Pathologic stage from 01/31/2022: Stage Unknown (pT1c, pNX, cM0, G2, ER+, PR+, HER2-) - Signed by Malachy Mood, MD on 02/12/2022     Malignant neoplasm of upper-outer quadrant of right breast in female, estrogen receptor positive (HCC)  11/28/2021 Mammogram   CLINICAL DATA:  Patient returns today to evaluate possible RIGHT breast asymmetries identified on recent screening mammogram.   EXAM: DIGITAL DIAGNOSTIC UNILATERAL RIGHT MAMMOGRAM WITH TOMOSYNTHESIS AND CAD; ULTRASOUND RIGHT BREAST LIMITED  IMPRESSION: 1. Irregular hypoechoic mass in the RIGHT breast at the 10 o'clock axis, 7 cm from the nipple, measuring 9 mm, with associated architectural distortion, corresponding to the mammographic finding. This is a highly suspicious finding for which ultrasound-guided core biopsy is recommended. 2. Additional benign findings within the RIGHT breast, as  detailed above. 3. No enlarged or morphologically abnormal lymph nodes in the RIGHT axilla.   12/06/2021 Cancer Staging   Staging form: Breast, AJCC 8th Edition - Clinical stage from 12/06/2021: Stage IA (cT1b, cN0, cM0, G2, ER+, PR+, HER2-) - Signed by Malachy Mood, MD on 12/11/2021 Stage prefix: Initial diagnosis Histologic grading system: 3 grade system   12/06/2021 Initial Biopsy   Diagnosis Breast, right, needle core biopsy, 10 o'clock, 7cmfn INVASIVE DUCTAL CARCINOMA WITH LOBULAR FEATURES, GRADE 2 (3+2+1)  An E-cadherin immunohistochemical stains performed with adequate control. This stain is diffusely negative within the tumor. Histologically the tumor forms ducts and is present in cribriform sheets as well as focally present in cords and small solid nests. Given this histology it is felt the negative E-cadherin stain represents aberrant absence of expression within an otherwise ductal carcinoma. This lack of E-cadherin expression may explain the focal lobular features.  PROGNOSTIC INDICATORS Results: The tumor cells are EQUIVOCAL for Her2 (2+). Her2 by FISH will be performed and the results reported separately. Estrogen Receptor: 95%, POSITIVE, STRONG STAINING INTENSITY Progesterone Receptor: 80%, POSITIVE, STRONG STAINING INTENSITY Proliferation Marker Ki67: 2%   12/10/2021 Initial Diagnosis   Malignant neoplasm of upper-outer quadrant of right breast in female, estrogen receptor positive (HCC)    Genetic Testing   [facility] CancerNext-Expanded Panel was Negative. Of note, a variant of uncertain significance was detected in the BRCA2 gene (p.P606L). Report date is 12/24/2021.  The CancerNext-Expanded gene panel offered by [facility] and includes sequencing, rearrangement, and RNA analysis for the following 77 genes: AIP, ALK, APC, ATM, AXIN2, BAP1, BARD1, BLM, BMPR1A, BRCA1, BRCA2, BRIP1, CDC73, CDH1, CDK4, CDKN1B, CDKN2A, CHEK2, CTNNA1, DICER1, FANCC, FH,  FLCN, GALNT12, KIF1B, LZTR1,  MAX, MEN1, MET, MLH1, MSH2, MSH3, MSH6, MUTYH, NBN, NF1, NF2, NTHL1, PALB2, PHOX2B, PMS2, POT1, PRKAR1A, PTCH1, PTEN, RAD51C, RAD51D, RB1, RECQL, RET, SDHA, SDHAF2, SDHB, SDHC, SDHD, SMAD4, SMARCA4, SMARCB1, SMARCE1, STK11, SUFU, TMEM127, TP53, TSC1, TSC2, VHL and XRCC2 (sequencing and deletion/duplication); EGFR, EGLN1, HOXB13, KIT, MITF, PDGFRA, POLD1, and POLE (sequencing only); EPCAM and GREM1 (deletion/duplication only).    01/31/2022 Cancer Staging   Staging form: Breast, AJCC 8th Edition - Pathologic stage from 01/31/2022: Stage Unknown (pT1c, pNX, cM0, G2, ER+, PR+, HER2-) - Signed by Malachy Mood, MD on 02/12/2022 Stage prefix: Initial diagnosis Histologic grading system: 3 grade system Residual tumor (R): R0 - None   01/31/2022 Definitive Surgery   FINAL MICROSCOPIC DIAGNOSIS:   A. BREAST, RIGHT, LUMPECTOMY:  -  Invasive ductal carcinoma with lobular features, Nottingham grade 2 of 3, 1.5 cm  -  Ductal carcinoma in-situ, intermediate grade  -  Margins uninvolved by carcinoma (<0.1 cm; anterior margin)  -  Previous biopsy site changes present  -  Adenosis and fibrocystic changes  -  See oncology table and comment below   B. BREAST, RIGHT ADDITIONAL ANTERIOMEDIAL MARGIN, EXCISION:  -  Usual ductal hyperplasia  -  No residual carcinoma identified  -  See comment   C. BREAST, RIGHT ADDITIONAL ANTERIOPOSTERIOR MARGIN, EXCISION:  -  No residual carcinoma identified   D. BREAST, RIGHT ADDITIONAL LATEROINFERIOR MARGIN, EXCISION:  -  Focal residual ductal carcinoma in situ  -  Fibrocystic changes with metaplasia  -  Margins uninvolved by carcinoma (0.1 cm; inferior margin)  -  See comment    03/2022 -  Anti-estrogen oral therapy   Tamoxifen x 5 years     CURRENT THERAPY:   INTERVAL HISTORY: Shannon Liu 82 y.o. female returns for f/u of her h/o breast cancer.  She has struggled on all antiestrogen therapies which she has tried.  She notes that with the exemestane, anastrozole, and  letrozole she had increased arthralgias that she was unable to tolerate.  When she was taking tamoxifen she experienced anxiety.  She wonders if this was related to her A-fib acting up which was causing difficulty at the time however has been significantly improved since she underwent a cardiac ablation.  She underwent bone density testing in 05/30/2022 was normal.  Her most recent mammogram occurred on November 08, 2022 demonstrating no mammographic evidence of malignancy and breast density category B.   Patient Active Problem List   Diagnosis Date Noted   PCP NOTES >>>>>> 07/17/2022   Obstructive sleep apnea syndrome 04/26/2022   Generalized anxiety disorder 04/26/2022   Chronic diastolic heart failure (HCC) 01/09/2022   Left ventricular dysfunction 12/31/2021   Pericardial effusion 12/31/2021   Mitral regurgitation 12/31/2021   Tricuspid regurgitation 12/31/2021   Annual physical exam 12/28/2021   Family history of prostate cancer 12/13/2021   Malignant neoplasm of upper-outer quadrant of right breast in female, estrogen receptor positive (HCC) 12/10/2021   Degenerative disc disease, cervical 03/20/2020   Chronic anticoagulation 07/04/2015   HOH (hard of hearing)    Hyperlipemia    PAF (paroxysmal atrial fibrillation) (HCC) 06/23/2015   HTN (hypertension)     is allergic to amlodipine besylate, buspirone, fluoxetine, morphine and related, penicillins, and sertraline hcl.  MEDICAL HISTORY: Past Medical History:  Diagnosis Date   Anxiety    Arthritis    Cancer (HCC) 2023   right breast   Cataract    CHF (congestive heart failure) (HCC)  Dysrhythmia    A fib   Heart murmur    HOH (hard of hearing)    Hyperlipidemia    Hypertension    PAF (paroxysmal atrial fibrillation) (HCC)    a. dx on 06/2015 admission. spontaneously converted into NSR. placed on Eliquis   Sleep apnea    CPAP    SURGICAL HISTORY: Past Surgical History:  Procedure Laterality Date   ABDOMINAL  HYSTERECTOMY     ATRIAL FIBRILLATION ABLATION N/A 09/10/2022   Procedure: ATRIAL FIBRILLATION ABLATION;  Surgeon: Regan Lemming, MD;  Location: [facility] INVASIVE CV LAB;  Service: Cardiovascular;  Laterality: N/A;   BREAST BIOPSY Right 12/06/2021   BREAST CYST EXCISION Left    BREAST LUMPECTOMY Right 01/31/2022   BREAST LUMPECTOMY WITH RADIOACTIVE SEED LOCALIZATION Right 01/31/2022   Procedure: RIGHT BREAST LUMPECTOMY WITH RADIOACTIVE SEED LOCALIZATION;  Surgeon: Harriette Bouillon, MD;  Location: [facility] OR;  Service: General;  Laterality: Right;   BREAST SURGERY Left    lumpectomy 35 yrs ago   CARDIOVERSION N/A 12/31/2021   Procedure: CARDIOVERSION;  Surgeon: Little Ishikawa, MD;  Location: [facility] ENDOSCOPY;  Service: Cardiovascular;  Laterality: N/A;   DILATION AND CURETTAGE OF UTERUS     NECK SURGERY     discectomy with cadaver bone   TEE WITHOUT CARDIOVERSION N/A 12/31/2021   Procedure: TRANSESOPHAGEAL ECHOCARDIOGRAM (TEE);  Surgeon: Little Ishikawa, MD;  Location: [facility] ENDOSCOPY;  Service: Cardiovascular;  Laterality: N/A;   TONSILLECTOMY     removed as a child   TUBAL LIGATION      SOCIAL HISTORY: Social History   Socioeconomic History   Marital status: Widowed    Spouse name: Not on file   Number of children: 2   Years of education: Not on file   Highest education level: Not on file  Occupational History   Occupation: Retired- [place] [employer] office  Tobacco Use   Smoking status: Never   Smokeless tobacco: Never  Vaping Use   Vaping Use: Never used  Substance and Sexual Activity   Alcohol use: No    Alcohol/week: 0.0 standard drinks of alcohol   Drug use: No   Sexual activity: Not Currently  Other Topics Concern   Not on file  Social History Narrative   Lives [place], near [place].    Daughter currently lives with her.   Social Determinants of Health   Financial Resource Strain: Low Risk  (08/15/2022)   Overall Financial Resource  Strain (CARDIA)    Difficulty of Paying Living Expenses: Not hard at all  Food Insecurity: No Food Insecurity (08/15/2022)   Hunger Vital Sign    Worried About Running Out of Food in the Last Year: Never true    Ran Out of Food in the Last Year: Never true  Transportation Needs: No Transportation Needs (08/15/2022)   PRAPARE - Administrator, Civil Service (Medical): No    Lack of Transportation (Non-Medical): No  Physical Activity: Insufficiently Active (08/15/2022)   Exercise Vital Sign    Days of Exercise per Week: 2 days    Minutes of Exercise per Session: 60 min  Stress: No Stress Concern Present (08/15/2022)   [employer] of Occupational Health - Occupational Stress Questionnaire    Feeling of Stress : Not at all  Social Connections: Moderately Isolated (08/15/2022)   Social Connection and Isolation Panel [NHANES]    Frequency of Communication with Friends and Family: More than three times a week    Frequency of  Social Gatherings with Friends and Family: More than three times a week    Attends Religious Services: More than 4 times per year    Active Member of [employer] or Organizations: No    Attends Banker Meetings: Never    Marital Status: Widowed  Intimate Partner Violence: Not At Risk (08/15/2022)   Humiliation, Afraid, Rape, and Kick questionnaire    Fear of Current or Ex-Partner: No    Emotionally Abused: No    Physically Abused: No    Sexually Abused: No    FAMILY HISTORY: Family History  Problem Relation Age of Onset   Heart attack Mother 14   CVA Mother 46   Unexplained death Father 11   Hypertension Sister    Hypertension Brother    Colon cancer Maternal Grandfather    Prostate cancer Son 31   Breast cancer Neg Hx     Review of Systems  Constitutional:  Negative for appetite change, chills, fatigue, fever and unexpected weight change.  HENT:   Negative for hearing loss, lump/mass and trouble swallowing.   Eyes:  Negative for eye  problems and icterus.  Respiratory:  Negative for chest tightness, cough and shortness of breath.   Cardiovascular:  Negative for chest pain, leg swelling and palpitations.  Gastrointestinal:  Negative for abdominal distention, abdominal pain, constipation, diarrhea, nausea and vomiting.  Endocrine: Negative for hot flashes.  Genitourinary:  Negative for difficulty urinating.   Musculoskeletal:  Negative for arthralgias.  Skin:  Negative for itching and rash.  Neurological:  Negative for dizziness, extremity weakness, headaches and numbness.  Hematological:  Negative for adenopathy. Does not bruise/bleed easily.  Psychiatric/Behavioral:  Negative for depression. The patient is not nervous/anxious.       PHYSICAL EXAMINATION   Onc Performance Status - 02/14/23 0945       ECOG Perf Status   ECOG Perf Status Fully active, able to carry on all pre-disease performance without restriction      KPS SCALE   KPS % SCORE Normal, no compliants, no evidence of disease             Vitals:   02/14/23 0945  BP: (!) 161/80  Pulse: 63  Resp: 18  SpO2: 98%    Physical Exam Constitutional:      General: She is not in acute distress.    Appearance: Normal appearance. She is not toxic-appearing.  HENT:     Head: Normocephalic and atraumatic.  Eyes:     General: No scleral icterus. Cardiovascular:     Rate and Rhythm: Normal rate and regular rhythm.     Pulses: Normal pulses.     Heart sounds: Normal heart sounds.  Pulmonary:     Effort: Pulmonary effort is normal.     Breath sounds: Normal breath sounds.  Chest:     Comments: Right breast status postlumpectomy no sign of local recurrence left breast is status post lumpectomy, no sign of local recurrence of breast cancer. Abdominal:     General: Abdomen is flat. Bowel sounds are normal. There is no distension.     Palpations: Abdomen is soft.     Tenderness: There is no abdominal tenderness.  Musculoskeletal:        General: No  swelling.     Cervical back: Neck supple.  Lymphadenopathy:     Cervical: No cervical adenopathy.  Skin:    General: Skin is warm and dry.     Findings: No rash.  Neurological:  General: No focal deficit present.     Mental Status: She is alert.  Psychiatric:        Mood and Affect: Mood normal.        Behavior: Behavior normal.     LABORATORY DATA:  CBC    Component Value Date/Time   WBC 5.3 02/14/2023 0912   WBC 10.3 01/30/2022 1122   RBC 4.51 02/14/2023 0912   HGB 13.7 02/14/2023 0912   HGB 14.1 03/02/2021 1046   HCT 40.7 02/14/2023 0912   HCT 41.0 03/02/2021 1046   PLT 208 02/14/2023 0912   PLT 233 03/02/2021 1046   MCV 90.2 02/14/2023 0912   MCV 89 03/02/2021 1046   MCH 30.4 02/14/2023 0912   MCHC 33.7 02/14/2023 0912   RDW 13.4 02/14/2023 0912   RDW 13.0 03/02/2021 1046   LYMPHSABS 1.5 02/14/2023 0912   MONOABS 0.7 02/14/2023 0912   EOSABS 0.2 02/14/2023 0912   BASOSABS 0.1 02/14/2023 0912    CMP     Component Value Date/Time   NA 141 02/14/2023 0912   NA 144 06/13/2022 1057   K 4.0 02/14/2023 0912   CL 108 02/14/2023 0912   CO2 27 02/14/2023 0912   GLUCOSE 111 (H) 02/14/2023 0912   BUN 17 02/14/2023 0912   BUN 16 06/13/2022 1057   CREATININE 0.70 02/14/2023 0912   CALCIUM 9.3 02/14/2023 0912   PROT 6.3 (L) 02/14/2023 0912   ALBUMIN 4.1 02/14/2023 0912   AST 19 02/14/2023 0912   ALT 13 02/14/2023 0912   ALKPHOS 100 02/14/2023 0912   BILITOT 0.9 02/14/2023 0912   GFRNONAA >60 02/14/2023 0912   GFRAA >60 06/14/2019 1059       ASSESSMENT and THERAPY PLAN:   Malignant neoplasm of upper-outer quadrant of right breast in female, estrogen receptor positive (HCC) Zina is an 82 year old woman with stage Ia ER/PR positive right-sided breast cancer diagnosed in February 2023 status postlumpectomy followed by antiestrogen therapy which she has had difficulty tolerating.  #1 right breast cancer: She has no clinical or radiographic signs of breast  cancer recurrence.  She is going to restart tamoxifen.  I recommended that she take 1/2 tablet daily and if she tolerates this increase to 1 tablet daily after a few weeks.  Her next mammogram is due in January 2025.  #2 bone health: Bone density testing in August 2023 is normal.  She will not need to repeat especially in light of her taking tamoxifen.  #3 health maintenance: She is recommended to continue seeing her primary care provider, healthy diet and exercise recommended.  We will see her back in 12 weeks for follow-up.   All questions were answered. The patient knows to call the clinic with any problems, questions or concerns. We can certainly see the patient much sooner if necessary.  Total encounter time:20 minutes*in face-to-face visit time, chart review, lab review, care coordination, order entry, and documentation of the encounter time.    Lillard Anes, NP 02/14/23 10:36 AM Medical Oncology and Hematology [facility] [address] [place], [place] [address] Tel. [phone]    Fax. [phone]  *Total Encounter Time as defined by the Centers for Medicare and Medicaid Services includes, in addition to the face-to-face time of a patient visit (documented in the note above) non-face-to-face time: obtaining and reviewing outside history, ordering and reviewing medications, tests or procedures, care coordination (communications with other health care professionals or caregivers) and documentation in the medical record.

## 2023-02-18 ENCOUNTER — Telehealth: Payer: Self-pay | Admitting: Adult Health

## 2023-02-18 NOTE — Telephone Encounter (Signed)
Scheduled appointment per 5/3 los. Patient is aware of the made appointment.

## 2023-03-02 ENCOUNTER — Telehealth: Payer: Self-pay | Admitting: Student

## 2023-03-02 NOTE — Telephone Encounter (Signed)
   Patient called After Hours Line with concerns that she may be back in atrial fibrillation. Called and spoke with patient. She had an atrial fibrillation ablation in 08/2022 and had been doing well until last night when she was laying down and reported sudden onset of palpitations. She had difficulty sleeping with it last night and had a little nausea associated with it but no vomiting, lightheadedness, dizziness, or chest pain. She takes Toprol-XL 25mg  daily at night and has remained on Eliquis since her ablation. She also has PRN Diltiazem 30mg  for heart rates >100. She has not needed to take this since her ablation but she took a dose of this shortly after midnight. She is feeling better this morning than she did last night but heart rates still elevated. BP 112/82 and HR 128 this morning. Given she is minimally symptomatic, will try to keep her out of the ED. Advised patient that she can take her PRN Diltiazem 30mg  every 4 to 6 hours as prescribed today. Will send a message to the [facility] to see if they can get her in early this week. If rates do not improve with this or she is having worsening symptoms prior to that, recommended she come to the ED. Patient voiced understanding and thanked me for calling.  Of note, patient also has Amlodipine 2.5mg  daily for her BP. Advised patient not to take this while she is consisently taking the Diltiazem.   Corrin Parker, PA-C 03/02/2023 8:00 AM

## 2023-03-06 ENCOUNTER — Encounter (HOSPITAL_COMMUNITY): Payer: Self-pay | Admitting: Physician Assistant

## 2023-03-06 ENCOUNTER — Ambulatory Visit (HOSPITAL_COMMUNITY)
Admission: RE | Admit: 2023-03-06 | Discharge: 2023-03-06 | Disposition: A | Discharge status: Home / Self Care | Payer: Medicare PPO | Source: Ambulatory Visit | Attending: Physician Assistant | Admitting: Physician Assistant

## 2023-03-06 ENCOUNTER — Other Ambulatory Visit: Payer: Self-pay | Admitting: Cardiology

## 2023-03-06 ENCOUNTER — Other Ambulatory Visit: Payer: Self-pay | Admitting: Internal Medicine

## 2023-03-06 VITALS — BP 152/94 | HR 64 | Wt 209.4 lb

## 2023-03-06 DIAGNOSIS — Z6833 Body mass index (BMI) 33.0-33.9, adult: Secondary | ICD-10-CM | POA: Insufficient documentation

## 2023-03-06 DIAGNOSIS — I110 Hypertensive heart disease with heart failure: Secondary | ICD-10-CM | POA: Insufficient documentation

## 2023-03-06 DIAGNOSIS — Z853 Personal history of malignant neoplasm of breast: Secondary | ICD-10-CM | POA: Insufficient documentation

## 2023-03-06 DIAGNOSIS — Z7901 Long term (current) use of anticoagulants: Secondary | ICD-10-CM | POA: Insufficient documentation

## 2023-03-06 DIAGNOSIS — E669 Obesity, unspecified: Secondary | ICD-10-CM | POA: Insufficient documentation

## 2023-03-06 DIAGNOSIS — I480 Paroxysmal atrial fibrillation: Secondary | ICD-10-CM | POA: Insufficient documentation

## 2023-03-06 DIAGNOSIS — D6869 Other thrombophilia: Secondary | ICD-10-CM | POA: Insufficient documentation

## 2023-03-06 DIAGNOSIS — I5022 Chronic systolic (congestive) heart failure: Secondary | ICD-10-CM | POA: Diagnosis not present

## 2023-03-06 DIAGNOSIS — G4733 Obstructive sleep apnea (adult) (pediatric): Secondary | ICD-10-CM | POA: Insufficient documentation

## 2023-03-06 DIAGNOSIS — Z8249 Family history of ischemic heart disease and other diseases of the circulatory system: Secondary | ICD-10-CM | POA: Insufficient documentation

## 2023-03-06 MED ORDER — AMLODIPINE BESYLATE 2.5 MG PO TABS: 2.5000 mg | ORAL_TABLET | Freq: Every day | ORAL | 1 refills | Status: DC

## 2023-03-06 NOTE — Progress Notes (Signed)
Primary Care Physician: Wanda Plump, MD Primary Cardiologist: Dr Anne Fu Primary Electrophysiologist: Dr Elberta Fortis  Referring Physician: Dr Marko Stai is a 82 y.o. female with a history of HTN, HLD, OSA, breast cancer, HFrEF, atrial fibrillation who presents for follow up in the [facility]. Patient is s/p afib ablation with Dr Elberta Fortis on 09/10/22. Patient is on Eliquis for a CHADS2VASC score of 5.  On follow up today, patient reports that she went back into afib overnight on 03/01/23. She called the on call team and was instructed to take her PRN diltiazem every 4 hours. She did go back to SR after ~24 hours. This was her first episode of afib since the ablation. There were no specific triggers that she could identify.   Today, she denies symptoms of palpitations, chest pain, shortness of breath, orthopnea, PND, lower extremity edema, dizziness, presyncope, syncope, snoring, daytime somnolence, bleeding, or neurologic sequela. The patient is tolerating medications without difficulties and is otherwise without complaint today.    Atrial Fibrillation Risk Factors:  she does have symptoms or diagnosis of sleep apnea. she is compliant with CPAP therapy. she does not have a history of rheumatic fever. she does not have a history of alcohol use.   she has a BMI of Body mass index is 33.8 kg/m.Marland Kitchen Filed Weights   03/06/23 1442  Weight: 95 kg    Family History  Problem Relation Age of Onset   Heart attack Mother 5   CVA Mother 69   Unexplained death Father 27   Hypertension Sister    Hypertension Brother    Colon cancer Maternal Grandfather    Prostate cancer Son 24   Breast cancer Neg Hx      Atrial Fibrillation Management history:  Previous antiarrhythmic drugs: none Previous cardioversions: 12/31/21 Previous ablations: 09/10/22 Anticoagulation history: Eliquis   Past Medical History:  Diagnosis Date   Anxiety    Arthritis     Cancer (HCC) 2023   right breast   Cataract    CHF (congestive heart failure) (HCC)    Dysrhythmia    A fib   Heart murmur    HOH (hard of hearing)    Hyperlipidemia    Hypertension    PAF (paroxysmal atrial fibrillation) (HCC)    a. dx on 06/2015 admission. spontaneously converted into NSR. placed on Eliquis   Sleep apnea    CPAP   Past Surgical History:  Procedure Laterality Date   ABDOMINAL HYSTERECTOMY     ATRIAL FIBRILLATION ABLATION N/A 09/10/2022   Procedure: ATRIAL FIBRILLATION ABLATION;  Surgeon: Regan Lemming, MD;  [facility] CV LAB;  Service: Cardiovascular;  Laterality: N/A;   BREAST BIOPSY Right 12/06/2021   BREAST CYST EXCISION Left    BREAST LUMPECTOMY Right 01/31/2022   BREAST LUMPECTOMY WITH RADIOACTIVE SEED LOCALIZATION Right 01/31/2022   Procedure: RIGHT BREAST LUMPECTOMY WITH RADIOACTIVE SEED LOCALIZATION;  Surgeon: Harriette Bouillon, MD;  Location: [facility] OR;  Service: General;  Laterality: Right;   BREAST SURGERY Left    lumpectomy 35 yrs ago   CARDIOVERSION N/A 12/31/2021   Procedure: CARDIOVERSION;  Surgeon: Little Ishikawa, MD;  Location: [facility] ENDOSCOPY;  Service: Cardiovascular;  Laterality: N/A;   DILATION AND CURETTAGE OF UTERUS     NECK SURGERY     discectomy with cadaver bone   TEE WITHOUT CARDIOVERSION N/A 12/31/2021   Procedure: TRANSESOPHAGEAL ECHOCARDIOGRAM (TEE);  Surgeon: Little Ishikawa, MD;  Location: [facility] ENDOSCOPY;  Service: Cardiovascular;  Laterality: N/A;   TONSILLECTOMY     removed as a child   TUBAL LIGATION      Current Outpatient Medications  Medication Sig Dispense Refill   acetaminophen (TYLENOL) 500 MG tablet Take 500 mg by mouth every 6 (six) hours as needed for mild pain.     ALPRAZolam (XANAX) 0.25 MG tablet Take 1 tablet (0.25 mg total) by mouth at bedtime as needed for anxiety. 30 tablet 0   Cholecalciferol (VITAMIN D) 50 MCG (2000 UT) CAPS Take 2,000 Units by mouth daily.     clindamycin  (CLINDAGEL) 1 % gel Apply topically 2 (two) times daily. 60 g 1   diltiazem (CARDIZEM) 30 MG tablet Take 1 tablet every 4 hours AS NEEDED for AFIB heart rate >100 45 tablet 3   ELIQUIS 5 MG TABS tablet TAKE ONE TABLET BY MOUTH TWICE DAILY 180 tablet 1   furosemide (LASIX) 20 MG tablet Take 1 tablet (20 mg total) by mouth daily.     Lactobacillus (PROBIOTIC ACIDOPHILUS PO) Take 1 tablet by mouth daily.     metoprolol succinate (TOPROL-XL) 25 MG 24 hr tablet TAKE ONE TABLET BY MOUTH ONCE DAILY. take WITH OR immediately following A meal 90 tablet 3   potassium chloride (KLOR-CON) 10 MEQ tablet TAKE ONE TABLET BY MOUTH TWICE DAILY 180 tablet 1   Propylene Glycol 0.6 % SOLN Place 1 drop into both eyes at bedtime. systane eye drops     rosuvastatin (CRESTOR) 10 MG tablet Take 1 tablet (10 mg total) by mouth at bedtime. 90 tablet 1   sacubitril-valsartan (ENTRESTO) 49-51 MG Take 1 tablet by mouth 2 (two) times daily. 180 tablet 3   tamoxifen (NOLVADEX) 20 MG tablet Take 20 mg by mouth daily. Take 1/2 tab daily for 2-4 weeks then increase to 1 tab daily     No current facility-administered medications for this encounter.    Allergies  Allergen Reactions   Amlodipine Besylate Other (See Comments)     Edema Taking 2.5 mg with no problems   Buspirone     Other reaction(s): miserable   Fluoxetine     Other reaction(s): insomnia   Morphine And Codeine Nausea And Vomiting   Penicillins Nausea And Vomiting   Sertraline Hcl     Other reaction(s): nightmares, sexual disfunction    Social History   Socioeconomic History   Marital status: Widowed    Spouse name: Not on file   Number of children: 2   Years of education: Not on file   Highest education level: Not on file  Occupational History   Occupation: Retired- [place] [employer] office  Tobacco Use   Smoking status: Never   Smokeless tobacco: Never  Vaping Use   Vaping Use: Never used  Substance and Sexual Activity   Alcohol use: No     Alcohol/week: 0.0 standard drinks of alcohol   Drug use: No   Sexual activity: Not Currently  Other Topics Concern   Not on file  Social History Narrative   Lives [place], near [place].    Daughter currently lives with her.   Social Determinants of Health   Financial Resource Strain: Low Risk  (08/15/2022)   Overall Financial Resource Strain (CARDIA)    Difficulty of Paying Living Expenses: Not hard at all  Food Insecurity: No Food Insecurity (08/15/2022)   Hunger Vital Sign    Worried About Running Out of Food in the Last Year: Never true  Ran Out of Food in the Last Year: Never true  Transportation Needs: No Transportation Needs (08/15/2022)   PRAPARE - Administrator, Civil Service (Medical): No    Lack of Transportation (Non-Medical): No  Physical Activity: Insufficiently Active (08/15/2022)   Exercise Vital Sign    Days of Exercise per Week: 2 days    Minutes of Exercise per Session: 60 min  Stress: No Stress Concern Present (08/15/2022)   [employer] of Occupational Health - Occupational Stress Questionnaire    Feeling of Stress : Not at all  Social Connections: Moderately Isolated (08/15/2022)   Social Connection and Isolation Panel [NHANES]    Frequency of Communication with Friends and Family: More than three times a week    Frequency of Social Gatherings with Friends and Family: More than three times a week    Attends Religious Services: More than 4 times per year    Active Member of [employer] or Organizations: No    Attends Banker Meetings: Never    Marital Status: Widowed  Intimate Partner Violence: Not At Risk (08/15/2022)   Humiliation, Afraid, Rape, and Kick questionnaire    Fear of Current or Ex-Partner: No    Emotionally Abused: No    Physically Abused: No    Sexually Abused: No     ROS- All systems are reviewed and negative except as per the HPI above.  Physical Exam: Vitals:   03/06/23 1442  BP: (!)  152/94  Pulse: 64  Weight: 95 kg    GEN- The patient is a well appearing elderly female, alert and oriented x 3 today.   Head- normocephalic, atraumatic Eyes-  Sclera clear, conjunctiva pink Ears- hearing intact Oropharynx- clear Neck- supple  Lungs- Clear to ausculation bilaterally, normal work of breathing Heart- Regular rate and rhythm, no murmurs, rubs or gallops  GI- soft, NT, ND, + BS Extremities- no clubbing, cyanosis, or edema MS- no significant deformity or atrophy Skin- no rash or lesion Psych- euthymic mood, full affect Neuro- strength and sensation are intact  Wt Readings from Last 3 Encounters:  03/06/23 95 kg  02/14/23 95.7 kg  12/28/22 93 kg    EKG today demonstrates  SR, PACs Vent. rate 64 BPM PR interval 166 ms QRS duration 72 ms QT/QTcB 438/451 ms  Echo 11/26/22 demonstrated  1. Left ventricular ejection fraction, by estimation, is 45 to 50%. The  left ventricle has mildly decreased function. The left ventricle  demonstrates global hypokinesis. Left ventricular diastolic parameters are  consistent with Grade I diastolic dysfunction (impaired relaxation).   2. Right ventricular systolic function is normal. The right ventricular  size is normal. There is normal pulmonary artery systolic pressure.   3. The mitral valve is normal in structure. Trivial mitral valve  regurgitation. No evidence of mitral stenosis.   4. The aortic valve was not well visualized. Aortic valve regurgitation  is not visualized. No aortic stenosis is present.   5. Aortic dilatation noted. There is mild dilatation of the ascending  aorta, measuring 37 mm.   6. The inferior vena cava is normal in size with greater than 50%  respiratory variability, suggesting right atrial pressure of 3 mmHg.   Epic records are reviewed at length today.  CHA2DS2-VASc Score = 5  The patient's score is based upon: CHF History: 1 HTN History: 1 Diabetes History: 0 Stroke History: 0 Vascular  Disease History: 0 Age Score: 2 Gender Score: 1       ASSESSMENT AND  PLAN: 1. Paroxysmal Atrial Fibrillation (ICD10:  I48.0) The patient's CHA2DS2-VASc score is 5, indicating a 7.2% annual risk of stroke.   S/p afib ablation 09/10/22 Patient back in SR, episode lasted ~24 hours. This was her first episode since ablation. Will pursue watchful waiting for now. Could consider AAD or repeat ablation if she has more frequent episodes.  Continue Eliquis 5 mg BID Continue Toprol 25 mg daily Continue diltiazem 30 mg PRN q 4 hours for heart racing.   2. Secondary Hypercoagulable State (ICD10:  D68.69) The patient is at significant risk for stroke/thromboembolism based upon her CHA2DS2-VASc Score of 5.  Continue Apixaban (Eliquis).   3. Obesity Body mass index is 33.8 kg/m. Lifestyle modification was discussed at length including regular exercise and weight reduction.  4. Obstructive sleep apnea Encouraged compliance with CPAP therapy.  5. HTN Mildly elevated today, patient to resume amlodipine.   6. Chronic HFrEF EF mildly improved, now 45-50% Fluid status appears stable.   Follow up with Dr Anne Fu and AF clinic as scheduled.    Jorja Loa PA-C Afib Clinic [facility] [address] [place], [place] [address] [phone] 03/06/2023 2:56 PM

## 2023-03-06 NOTE — Telephone Encounter (Addendum)
Prescription refill request for Eliquis received. Indication: AFIB  Last office visit: [place],  12/18/2022 Scr: 0.70, 02/14/2023 Age: 82 yo  Weight: 95.7 kg   Refill sent.

## 2023-03-11 ENCOUNTER — Other Ambulatory Visit: Payer: Self-pay | Admitting: Cardiology

## 2023-03-13 ENCOUNTER — Telehealth: Payer: Self-pay | Admitting: Internal Medicine

## 2023-03-13 NOTE — Telephone Encounter (Signed)
Requesting: ALPRAZolam (XANAX) 0.25 MG tablet Take 1 tablet (0.25 mg total) by mouth at bedtime as needed for anxiety.  Contract: none UDS: none Last Visit: 10/23/2022 Next Visit: 03/19/2023 Last Refill: 10/23/2022 #30 no refills  Please Advise

## 2023-03-13 NOTE — Telephone Encounter (Signed)
Pdmp ok, rf sent  

## 2023-03-15 IMAGING — MG MM BREAST LOCALIZATION CLIP
4 series · 4 of 12 positions shown · non-contrast
Comparison: Previous exam(s).

CLINICAL DATA: Patient status post ultrasound-guided biopsy right
breast mass.

EXAM:
3D DIAGNOSTIC RIGHT MAMMOGRAM POST ULTRASOUND BIOPSY

[R CC synth-2D]
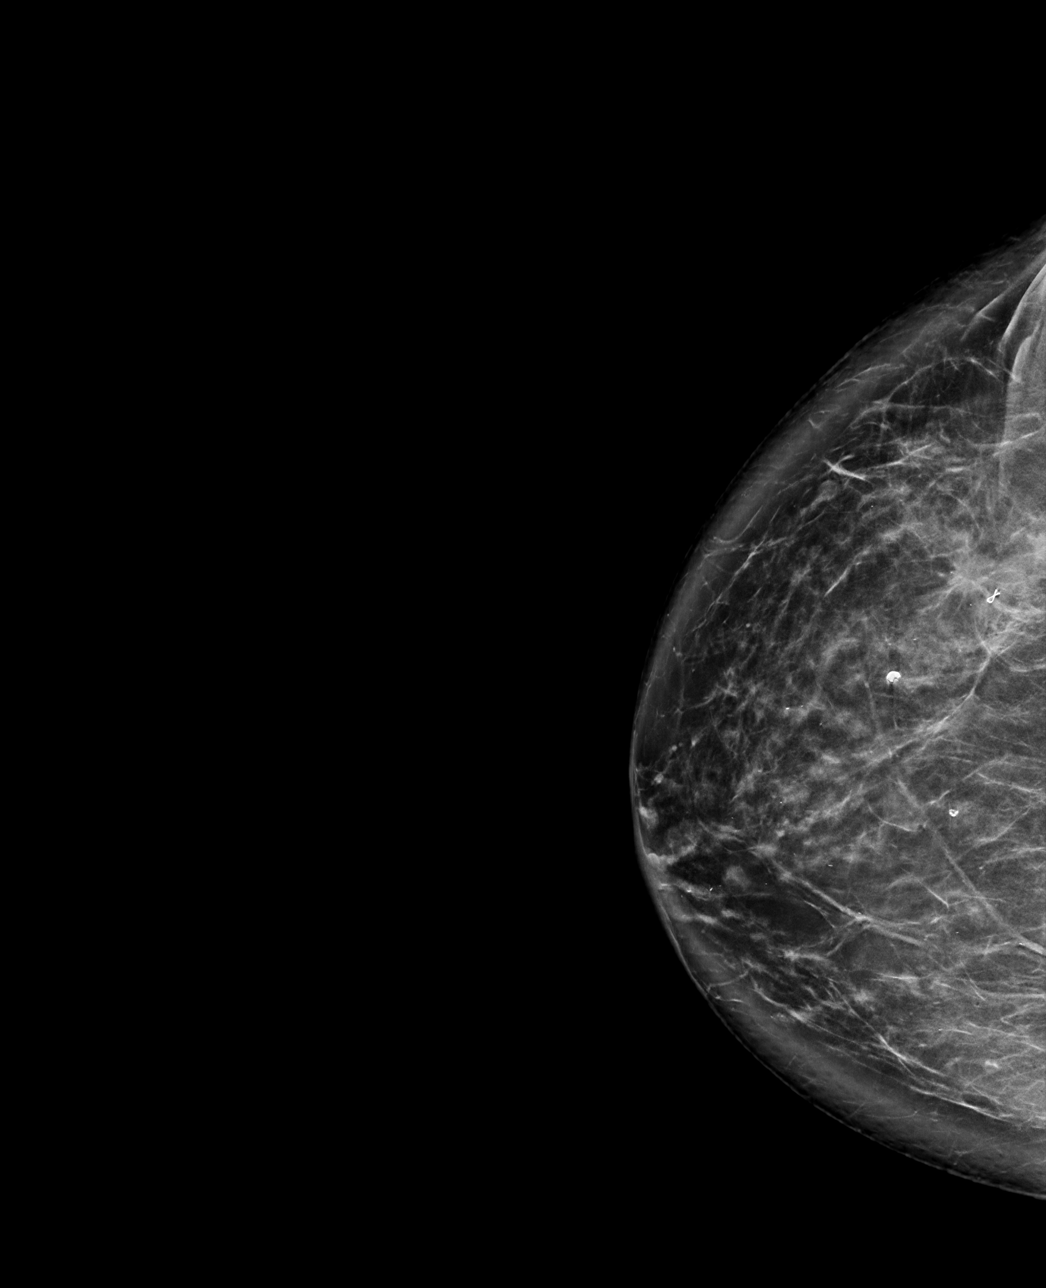

[R ML synth-2D]
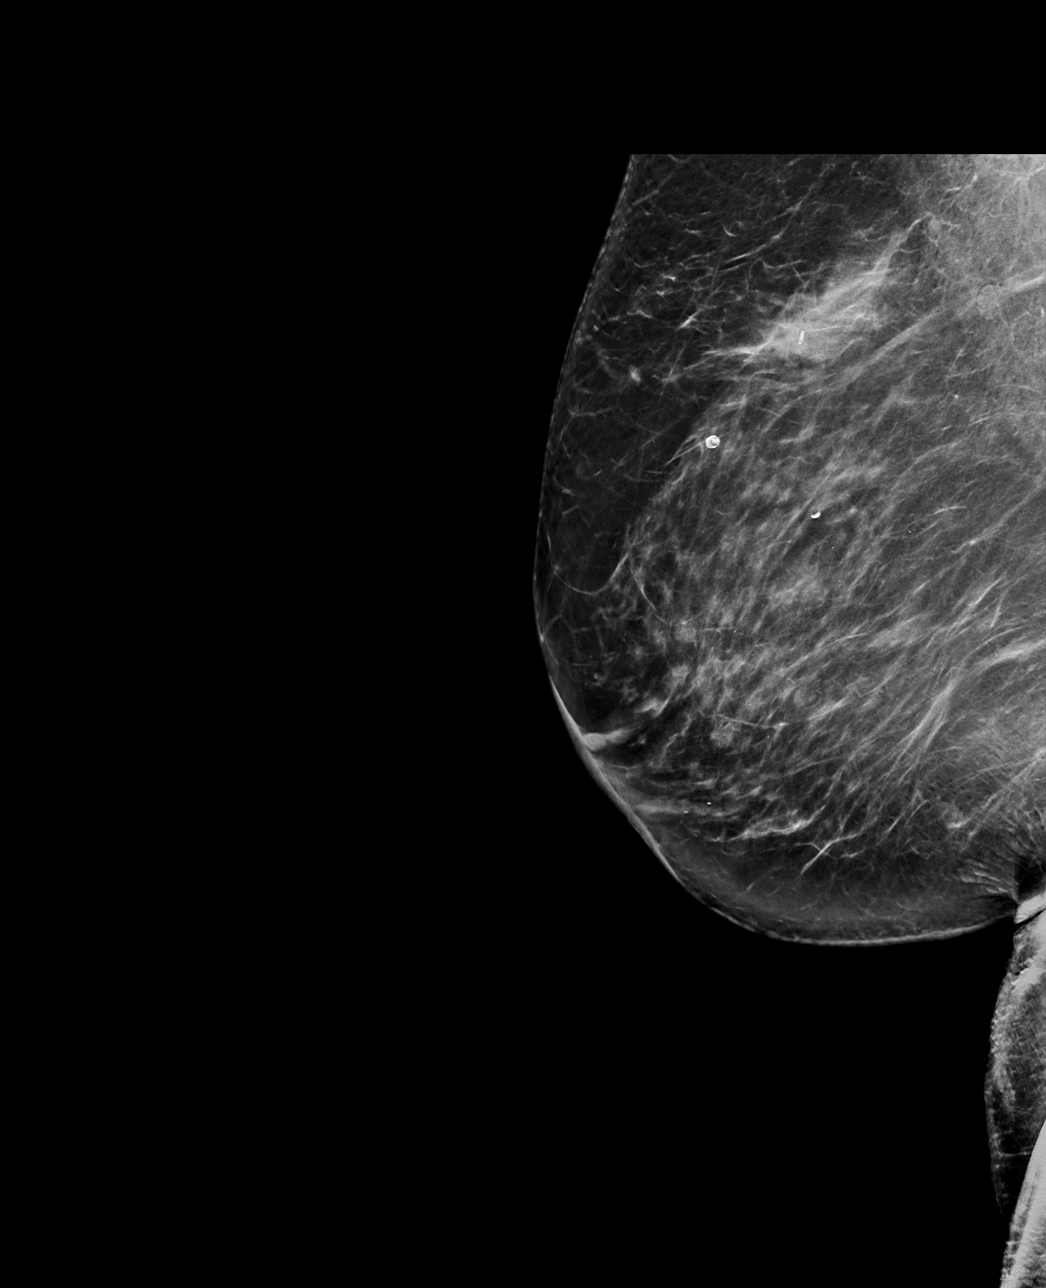

[R CC tomo · tomo slice 47/92.0]
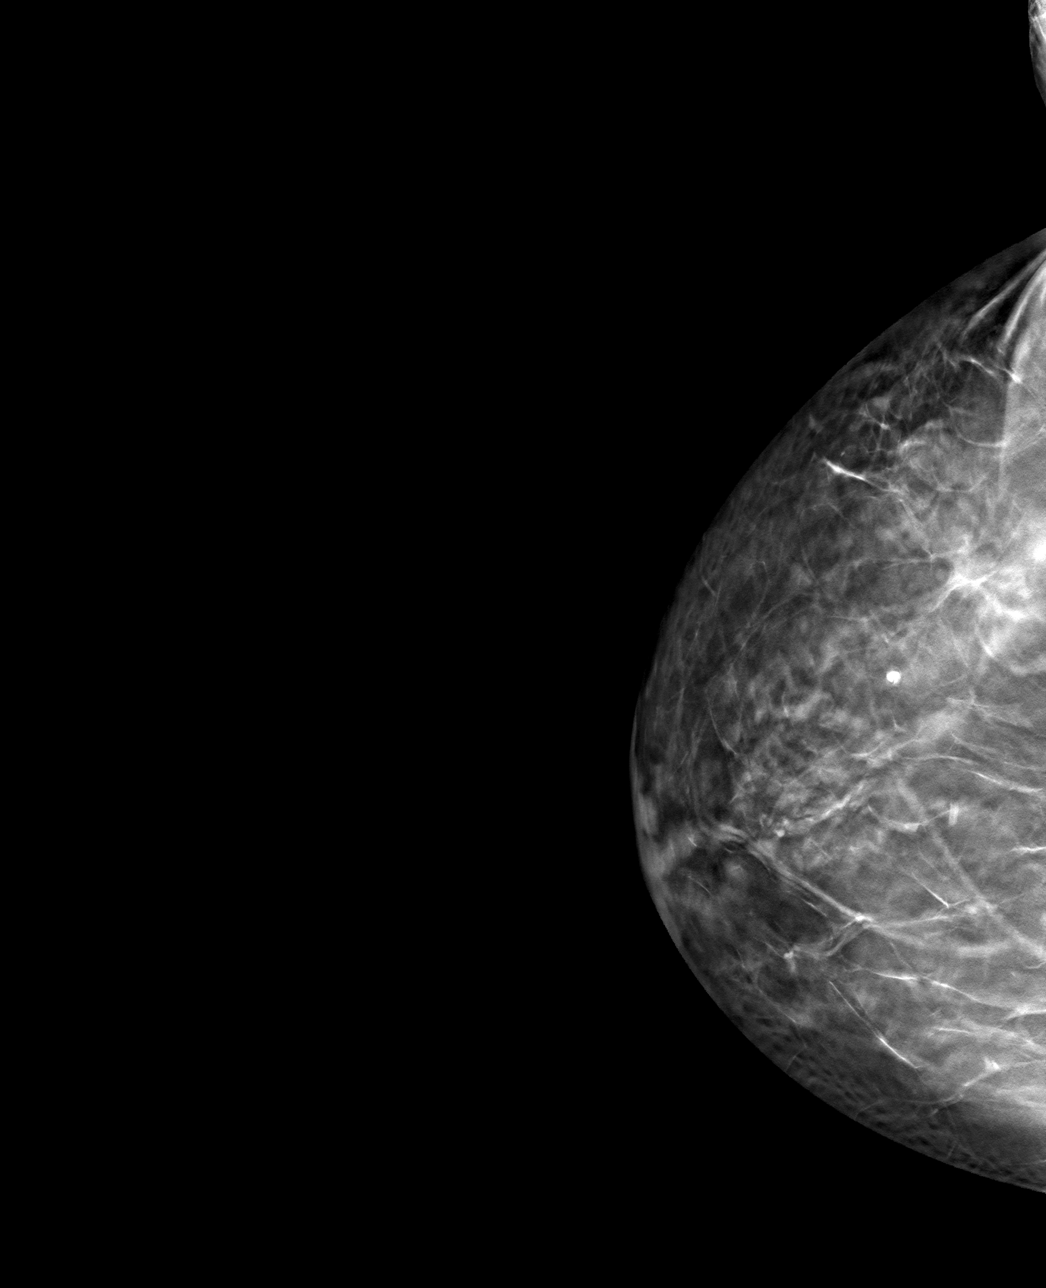

[R ML tomo · tomo slice 47/94.0]
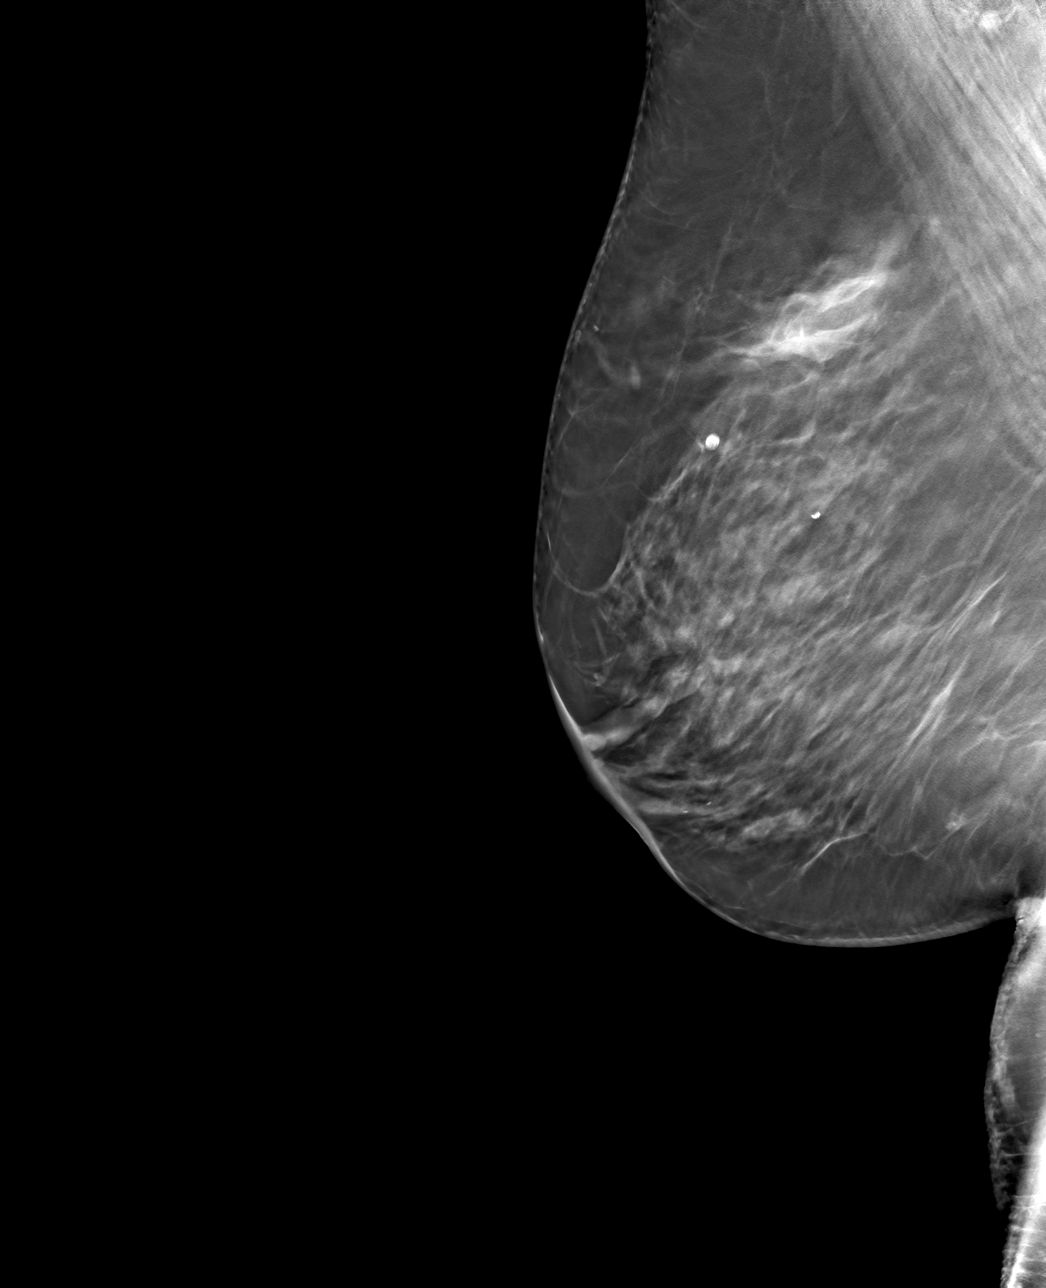

[4 of 12 positions shown; findings below may reference images not displayed]

FINDINGS: 3D Mammographic images were obtained following ultrasound guided
biopsy of right breast mass. The biopsy marking clip is in expected
position at the site of biopsy.
IMPRESSION: Appropriate positioning of the ribbon shaped biopsy marking clip at
the site of biopsy in the upper-outer right breast.

Final Assessment: Post Procedure Mammograms for Marker Placement

## 2023-03-15 IMAGING — US US  BREAST BX W/ LOC DEV 1ST LESION IMG BX SPEC US GUIDE*R*
1 series · 11 of 11 positions shown · non-contrast
Comparison: Previous exam(s).
COMPARISON: Previous exam(s).

Addendum:
CLINICAL DATA: Patient with indeterminate right breast mass 10
o'clock position

EXAM:
ULTRASOUND GUIDED RIGHT BREAST CORE NEEDLE BIOPSY

[Series 1: us breast bx w/ loc dev 1st lesion img bx spec us  · 0.07mm/px · 11 of 11 slices shown]
[im 1/11]
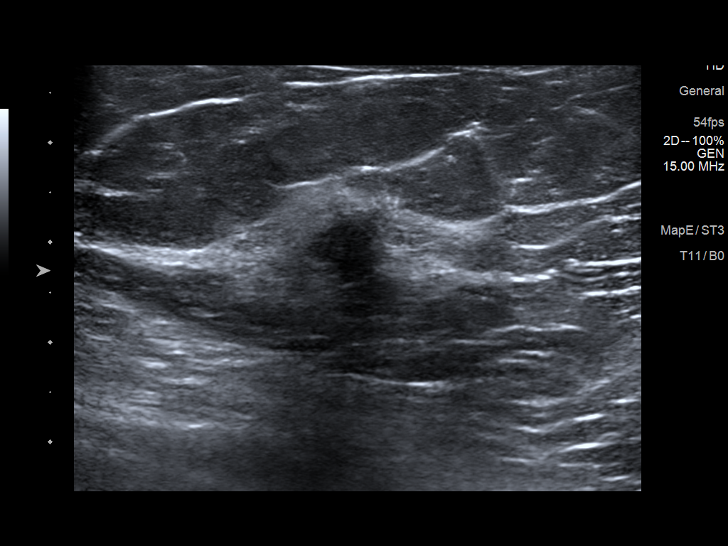
[im 2/11]
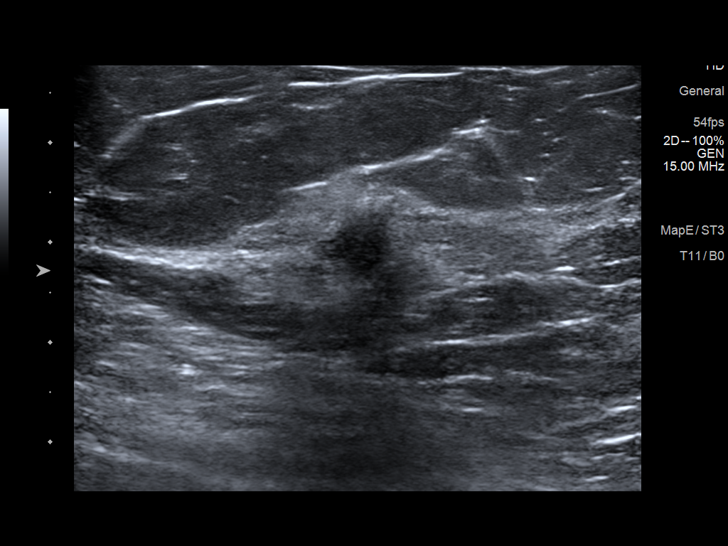
[im 3/11]
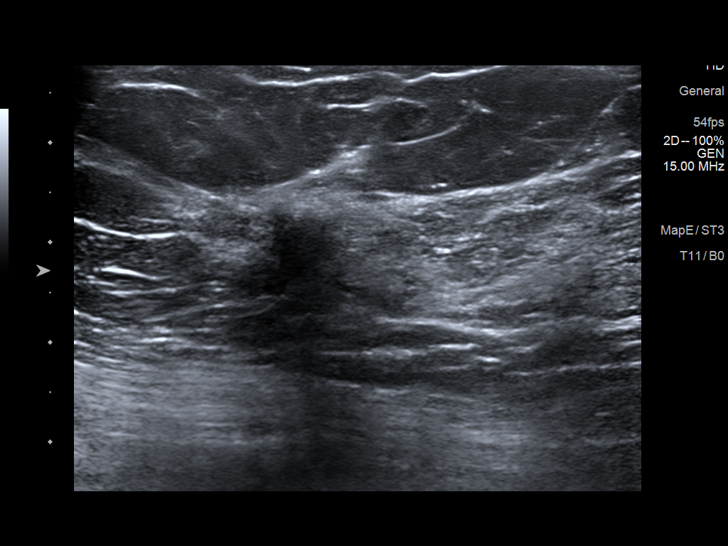
[im 4/11]
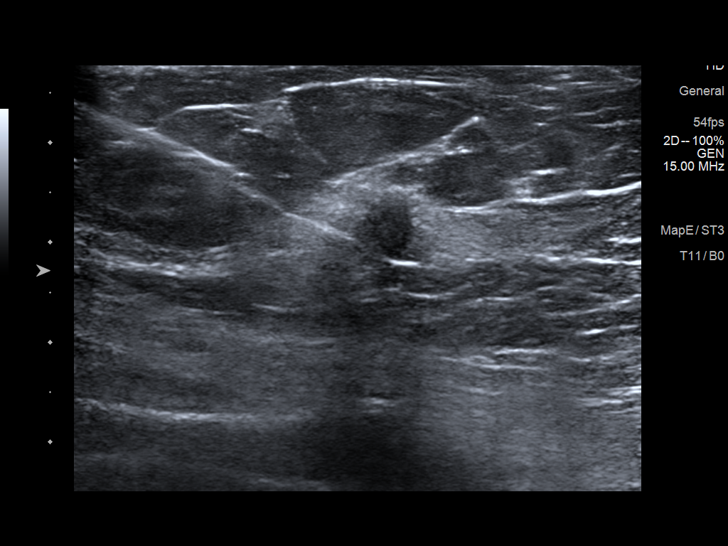
[im 5/11]
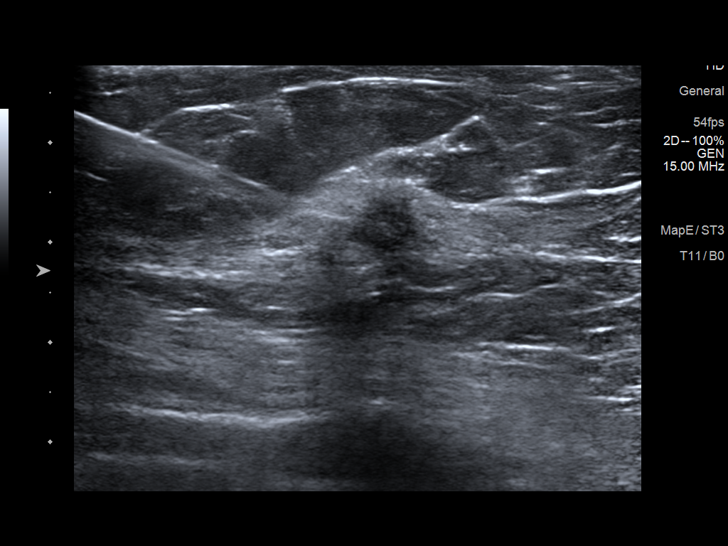
[im 6/11]
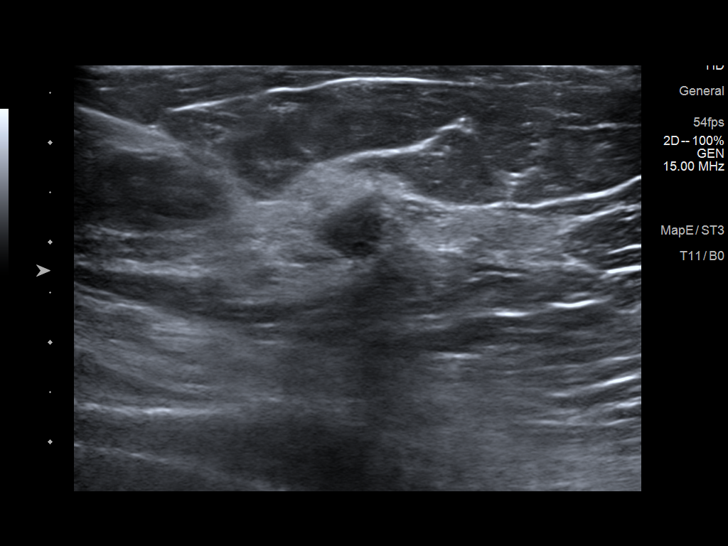
[im 7/11]
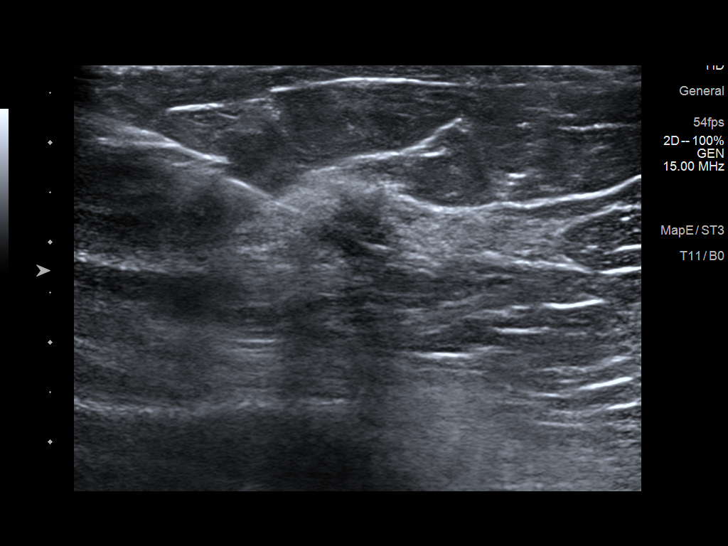
[im 8/11]
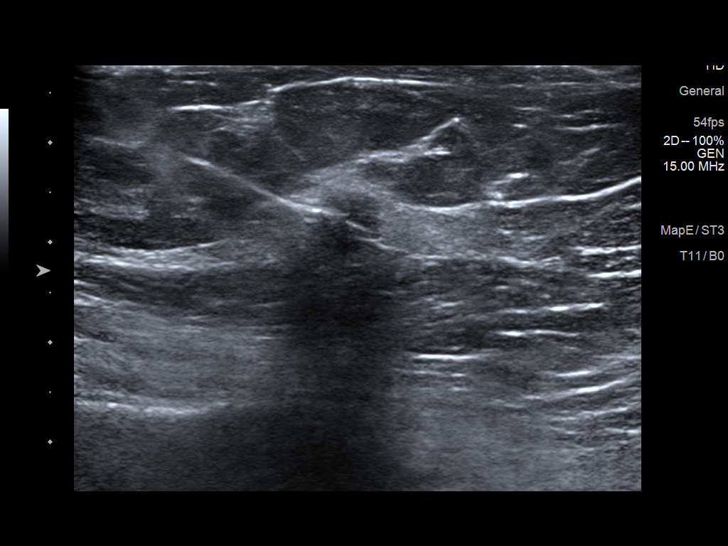
[im 9/11]
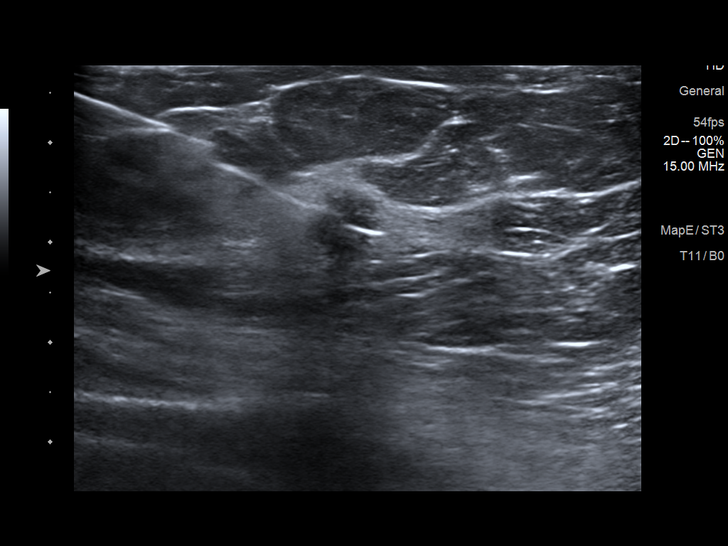
[im 10/11]
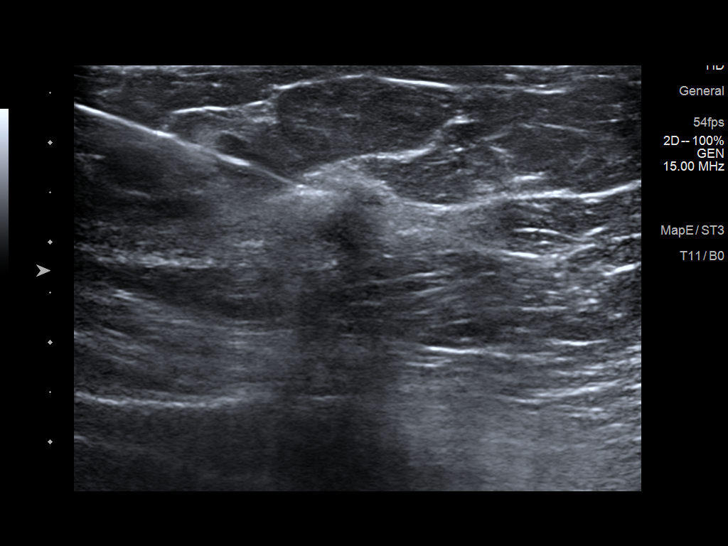
[im 11/11]
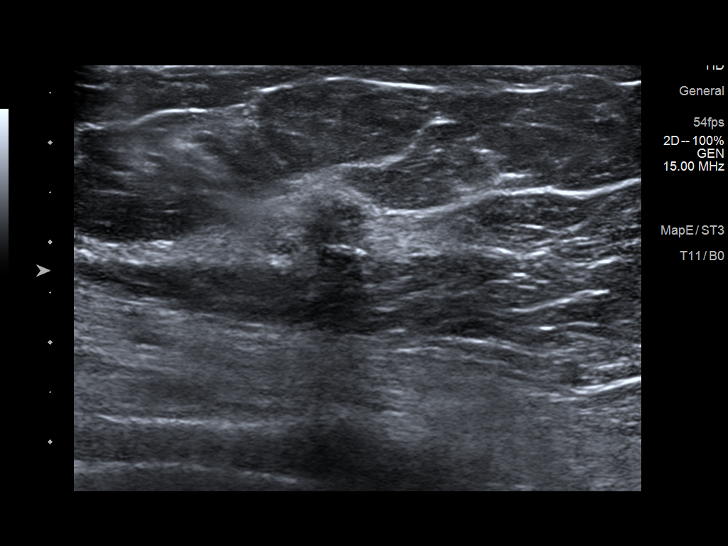

[11 of 11 positions shown; findings below may reference images not displayed]



Lesion quadrant: Upper outer quadrant

Using sterile technique and 1% Lidocaine as local anesthetic, under
direct ultrasound visualization, a 14 gauge Pedi device was
used to perform biopsy of right breast mass 10 o'clock position
using a lateral approach. At the conclusion of the procedure ribbon
tissue marker clip was deployed into the biopsy cavity. Follow up 2
view mammogram was performed and dictated separately.
IMPRESSION: Ultrasound guided biopsy of right breast mass 10 o'clock position.
No apparent complications.

ADDENDUM:
Pathology revealed GRADE II INVASIVE DUCTAL CARCINOMA WITH LOBULAR
FEATURES of the RIGHT breast, 10 o'clock, 5cmfn, (ribbon clip). This
was found to be concordant by Dr. Annett Inga.

Pathology results were discussed with the patient by telephone. The
patient reported doing well after the biopsy with tenderness at the
site. Post biopsy instructions and care were reviewed and questions
were answered. The patient was encouraged to call The [REDACTED] for any additional concerns. My direct phone
number was provided.

The patient was referred to [REDACTED]
[REDACTED] at [REDACTED] on
December 12, 2021.

Pathology results reported by Hessen Princes, RN on 12/07/2021.



Lesion quadrant: Upper outer quadrant

Using sterile technique and 1% Lidocaine as local anesthetic, under
direct ultrasound visualization, a 14 gauge Pedi device was
used to perform biopsy of right breast mass 10 o'clock position
using a lateral approach. At the conclusion of the procedure ribbon
tissue marker clip was deployed into the biopsy cavity. Follow up 2
view mammogram was performed and dictated separately.
IMPRESSION: Ultrasound guided biopsy of right breast mass 10 o'clock position.
No apparent complications.

## 2023-03-19 ENCOUNTER — Encounter: Payer: Self-pay | Admitting: Internal Medicine

## 2023-03-19 ENCOUNTER — Telehealth: Payer: Self-pay | Admitting: Cardiology

## 2023-03-19 ENCOUNTER — Ambulatory Visit: Payer: Medicare PPO | Admitting: Internal Medicine

## 2023-03-19 VITALS — BP 136/86 | HR 69 | Temp 97.9°F | Resp 18 | Ht 66.0 in | Wt 209.0 lb

## 2023-03-19 DIAGNOSIS — I10 Essential (primary) hypertension: Secondary | ICD-10-CM

## 2023-03-19 DIAGNOSIS — R739 Hyperglycemia, unspecified: Secondary | ICD-10-CM | POA: Diagnosis not present

## 2023-03-19 DIAGNOSIS — C44619 Basal cell carcinoma of skin of left upper limb, including shoulder: Secondary | ICD-10-CM | POA: Diagnosis not present

## 2023-03-19 DIAGNOSIS — F411 Generalized anxiety disorder: Secondary | ICD-10-CM | POA: Diagnosis not present

## 2023-03-19 DIAGNOSIS — Z79899 Other long term (current) drug therapy: Secondary | ICD-10-CM

## 2023-03-19 LAB — HEMOGLOBIN A1C: Hgb A1c MFr Bld: 5.9 % (ref 4.6–6.5)

## 2023-03-19 MED ORDER — AMLODIPINE BESYLATE 5 MG PO TABS
5.0000 mg | ORAL_TABLET | Freq: Every day | ORAL | 1 refills | Status: DC
  Filled 2023-06-12: qty 90, 90d supply, fill #0

## 2023-03-19 NOTE — Progress Notes (Unsigned)
Subjective:    Patient ID: Shannon Liu, female    DOB: 1940-12-03, 82 y.o.   MRN: [id number]  DOS:  03/19/2023 Type of visit - description: Routine follow-up  Atrial fibrillation: Had a single episode of A-fib since ablation last year, saw cardiology. Since the episode of A-fib she is feeling better.  No chest pain, no difficulty breathing or edema.  Very seldom has a fluttery feeling in the chest but is nothing compared to the episode of A-fib.  Ambulatory BPs are slightly elevated.  2 months ago developed skin lesion at the left biceps.  Review of Systems See above   Past Medical History:  Diagnosis Date   Anxiety    Arthritis    Cancer (HCC) 2023   right breast   Cataract    CHF (congestive heart failure) (HCC)    Dysrhythmia    A fib   Heart murmur    HOH (hard of hearing)    Hyperlipidemia    Hypertension    PAF (paroxysmal atrial fibrillation) (HCC)    a. dx on 06/2015 admission. spontaneously converted into NSR. placed on Eliquis   Sleep apnea    CPAP    Past Surgical History:  Procedure Laterality Date   ABDOMINAL HYSTERECTOMY     ATRIAL FIBRILLATION ABLATION N/A 09/10/2022   Procedure: ATRIAL FIBRILLATION ABLATION;  Surgeon: Regan Lemming, MD;  Location: [facility] INVASIVE CV LAB;  Service: Cardiovascular;  Laterality: N/A;   BREAST BIOPSY Right 12/06/2021   BREAST CYST EXCISION Left    BREAST LUMPECTOMY Right 01/31/2022   BREAST LUMPECTOMY WITH RADIOACTIVE SEED LOCALIZATION Right 01/31/2022   Procedure: RIGHT BREAST LUMPECTOMY WITH RADIOACTIVE SEED LOCALIZATION;  Surgeon: Harriette Bouillon, MD;  Location: [facility] OR;  Service: General;  Laterality: Right;   BREAST SURGERY Left    lumpectomy 35 yrs ago   CARDIOVERSION N/A 12/31/2021   Procedure: CARDIOVERSION;  Surgeon: Little Ishikawa, MD;  Location: [facility] ENDOSCOPY;  Service: Cardiovascular;  Laterality: N/A;   DILATION AND CURETTAGE OF UTERUS     NECK SURGERY     discectomy with cadaver bone   TEE  WITHOUT CARDIOVERSION N/A 12/31/2021   Procedure: TRANSESOPHAGEAL ECHOCARDIOGRAM (TEE);  Surgeon: Little Ishikawa, MD;  Location: [facility] ENDOSCOPY;  Service: Cardiovascular;  Laterality: N/A;   TONSILLECTOMY     removed as a child   TUBAL LIGATION      Current Outpatient Medications  Medication Instructions   acetaminophen (TYLENOL) 500 mg, Oral, Every 6 hours PRN   ALPRAZolam (XANAX) 0.25 MG tablet TAKE ONE TABLET BY MOUTH EVERYDAY AT BEDTIME AS NEEDED FOR ANXIETY   amLODipine (NORVASC) 2.5 mg, Oral, Daily   clindamycin (CLINDAGEL) 1 % gel Topical, 2 times daily   diltiazem (CARDIZEM) 30 MG tablet Take 1 tablet every 4 hours AS NEEDED for AFIB heart rate >100   ELIQUIS 5 MG TABS tablet TAKE ONE TABLET BY MOUTH TWICE DAILY   furosemide (LASIX) 20 mg, Oral, Daily   Lactobacillus (PROBIOTIC ACIDOPHILUS PO) 1 tablet, Oral, Daily   metoprolol succinate (TOPROL-XL) 25 MG 24 hr tablet TAKE ONE TABLET BY MOUTH ONCE DAILY. take WITH OR immediately following A meal   potassium chloride (KLOR-CON) 10 MEQ tablet 10 mEq, Oral, 2 times daily   Propylene Glycol 0.6 % SOLN 1 drop, Both Eyes, Daily at bedtime, systane eye drops   rosuvastatin (CRESTOR) 10 mg, Oral, Daily at bedtime   sacubitril-valsartan (ENTRESTO) 49-51 MG 1 tablet, Oral, 2 times daily   tamoxifen (NOLVADEX)  20 mg, Oral, Daily, Take 1/2 tab daily for 2-4 weeks then increase to 1 tab daily   Vitamin D 2,000 Units, Oral, Daily       Objective:   Physical Exam BP (!) 144/82   Pulse 69   Temp 97.9 F (36.6 C) (Oral)   Resp 18   Ht 5\' 6"  (1.676 m)   Wt 209 lb (94.8 kg)   SpO2 97%   BMI 33.73 kg/m  General:   Well developed, NAD, BMI noted. HEENT:  Normocephalic . Face symmetric, atraumatic Lungs:  CTA B Normal respiratory effort, no intercostal retractions, no accessory muscle use. Heart: RRR,  no murmur.  Lower extremities: no pretibial edema bilaterally  Skin: L biceps has a 1 cm, nodule lesion,  pink and shiny w/  a central erosion. Neurologic:  alert & oriented X3.  Speech normal, gait appropriate for age and unassisted Psych--  Cognition and judgment appear intact.  Cooperative with normal attention span and concentration.  Behavior appropriate. No anxious or depressed appearing.      Assessment    Assessment (new patient 07-2022, previous PCP Dr. Kirby Funk retired) Prediabetes HTN High cholesterol CV: -- CHF  -- Atrial fibrillation dx ~ 2016 -Ablation 08-2022 OSA, on CPAP dx 2021 Anxiety (chronic xanax prn) History of R breast cancer (+ lumpectomy April 2023, no chemo, no XRT) DJD  Dermatology: Dr. Margo Aye   PLAN Prediabetes: Check A1c CHF, A-fib.  Last visit with cardiology 03/06/2023, the patient reported he had an episode of A-fib a week prior to the visit, no changes  recommended.  Currently asymptomatic although from time to time has slightly fluttery feeling in the chest but nothing compared to the severe palpitation she has during the episode of A-fib. Heart rate at home particularly in the 60s.  See next HTN: Ambulatory BPs at home range from 152/83 to 136/75.    Plan: Increase amlodipine 2.5 to 5 mg daily, watch for swelling which was a problem before. Continue metoprolol, Entresto, potassium.  Monitor BPs at home.  Goals provided.  Last BMP normal BCC: See physical exam, lesion at the left arm started 2 months ago, she is aware this is likely [facility] and plans to see Dr. Margo Aye, recommend to be seen within the next couple of weeks. OSA: Good CPAP compliance. Anxiety, on Xanax, check UDS RTC 4 m

## 2023-03-19 NOTE — Patient Instructions (Addendum)
Please increase amlodipine to 5 mg daily.  If you develop swelling of the legs let us know.  Check the  blood pressure regularly BP GOAL is between 110/65 and  135/85. If it is consistently higher or lower, let me know  See Dr. Margo Aye within 2 weeks.     GO TO THE LAB : Get the blood work     GO TO THE FRONT DESK, PLEASE SCHEDULE YOUR APPOINTMENTS Come back for a checkup in 4 months  Vaccines I recommend: Covid booster RSV vaccine  Please bring [place] a copy of your Healthcare Power of Attorney for your chart.

## 2023-03-19 NOTE — Telephone Encounter (Signed)
Left the pt a message to call the office back. 

## 2023-03-19 NOTE — Telephone Encounter (Signed)
Pt c/o medication issue:  1. Name of Medication:   furosemide (LASIX) 20 MG tablet   2. How are you currently taking this medication (dosage and times per day)?   As prescribed - 1 tablet by mouth daily  3. Are you having a reaction (difficulty breathing--STAT)?  No  4. What is your medication issue?   Patient stated her medication had been increased and she needs a new prescription to read 1 tablet by mouth daily.  Patient wants call back to confirm the correct dosage and needs a refill sent to [facility] - [place], [place] - [address].

## 2023-03-20 NOTE — Assessment & Plan Note (Signed)
Prediabetes: Check A1c CHF, A-fib.  Last visit with cardiology 03/06/2023, the patient reported he had an episode of A-fib a week prior to the visit, no changes  recommended.  Currently asymptomatic although from time to time has slightly fluttery feeling in the chest but nothing compared to the severe palpitation she has during the episode of A-fib. Heart rate at home particularly in the 60s.  See next HTN: Ambulatory BPs at home range from 152/83 to 136/75.    Plan: Increase amlodipine 2.5 to 5 mg daily, watch for swelling which was a problem before. Continue metoprolol, Entresto, potassium.  Monitor BPs at home.  Goals provided.  Last BMP normal BCC: See physical exam, lesion at the left arm started 2 months ago, she is aware this is likely [facility] and plans to see Dr. Margo Aye, recommend to be seen within the next couple of weeks. OSA: Good CPAP compliance. Anxiety, on Xanax, check UDS RTC 4 m

## 2023-03-21 ENCOUNTER — Other Ambulatory Visit: Payer: Self-pay | Admitting: *Deleted

## 2023-03-21 LAB — DRUG MONITORING PANEL 375977 , URINE

## 2023-03-21 LAB — DM TEMPLATE

## 2023-03-21 MED ORDER — FUROSEMIDE 20 MG PO TABS
20.0000 mg | ORAL_TABLET | Freq: Every day | ORAL | 3 refills | Status: DC
  Filled 2023-06-12: qty 90, 90d supply, fill #0
  Filled 2023-09-03: qty 90, 90d supply, fill #1
  Filled 2023-12-04: qty 90, 90d supply, fill #2
  Filled 2024-03-03: qty 90, 90d supply, fill #3

## 2023-03-21 NOTE — Telephone Encounter (Signed)
Furosemide was increased to 20 mg daily by Rudi Coco, NP.  Script was updated in Epic but not sent into pharmacy.   Updated RX sent electronically to pharmacy as requested.

## 2023-03-21 NOTE — Telephone Encounter (Signed)
Pt aware to take 20 mg daily and RX was sent into her pharmacy as requested.

## 2023-03-26 DIAGNOSIS — C44629 Squamous cell carcinoma of skin of left upper limb, including shoulder: Secondary | ICD-10-CM | POA: Diagnosis not present

## 2023-03-26 DIAGNOSIS — C44319 Basal cell carcinoma of skin of other parts of face: Secondary | ICD-10-CM | POA: Diagnosis not present

## 2023-04-01 DIAGNOSIS — G4733 Obstructive sleep apnea (adult) (pediatric): Secondary | ICD-10-CM | POA: Diagnosis not present

## 2023-04-02 ENCOUNTER — Ambulatory Visit: Payer: Medicare PPO | Admitting: Internal Medicine

## 2023-04-02 ENCOUNTER — Other Ambulatory Visit: Payer: Self-pay | Admitting: Internal Medicine

## 2023-04-04 ENCOUNTER — Encounter: Payer: Self-pay | Admitting: Adult Health

## 2023-04-04 ENCOUNTER — Other Ambulatory Visit: Payer: Self-pay

## 2023-04-04 MED ORDER — TAMOXIFEN CITRATE 20 MG PO TABS: 20.0000 mg | ORAL_TABLET | Freq: Every day | ORAL | 0 refills | Status: DC

## 2023-04-07 ENCOUNTER — Encounter: Payer: Self-pay | Admitting: Cardiology

## 2023-04-11 ENCOUNTER — Ambulatory Visit: Payer: Medicare PPO | Admitting: Cardiology

## 2023-04-19 ENCOUNTER — Encounter: Payer: Self-pay | Admitting: Adult Health

## 2023-04-21 ENCOUNTER — Telehealth: Payer: Self-pay | Admitting: Adult Health

## 2023-04-21 NOTE — Telephone Encounter (Signed)
Patient's daughter is aware of upcoming appointment times/dates  

## 2023-04-22 ENCOUNTER — Inpatient Hospital Stay: Payer: Medicare PPO | Attending: Adult Health | Admitting: Adult Health

## 2023-04-22 DIAGNOSIS — C50411 Malignant neoplasm of upper-outer quadrant of right female breast: Secondary | ICD-10-CM | POA: Diagnosis not present

## 2023-04-22 DIAGNOSIS — Z170 Estrogen receptor positive status [ER+]: Secondary | ICD-10-CM | POA: Diagnosis not present

## 2023-04-22 NOTE — Progress Notes (Signed)
[facility] Cancer Follow up:    Shannon Plump, MD [address] [place] [address]   DIAGNOSIS: Cancer Staging  Malignant neoplasm of upper-outer quadrant of right breast in female, estrogen receptor positive (HCC) Staging form: Breast, AJCC 8th Edition - Clinical stage from 12/06/2021: Stage IA (cT1b, cN0, cM0, G2, ER+, PR+, HER2-) - Signed by Malachy Mood, MD on 12/11/2021 Stage prefix: Initial diagnosis Histologic grading system: 3 grade system - Pathologic stage from 01/31/2022: Stage Unknown (pT1c, pNX, cM0, G2, ER+, PR+, HER2-) - Signed by Malachy Mood, MD on 02/12/2022 Stage prefix: Initial diagnosis Histologic grading system: 3 grade system Residual tumor (R): R0 - None  I connected with [name] on 04/22/23 at  3:15 PM EDT by telephone and verified that I am speaking with the correct person using two identifiers.  I discussed the limitations, risks, security and privacy concerns of performing an evaluation and management service by telephone and the availability of in person appointments.  I also discussed with the patient that there may be a patient responsible charge related to this service. The patient expressed understanding and agreed to proceed.   Provider location: [facility] office Patient location: home  SUMMARY OF ONCOLOGIC HISTORY: Oncology History Overview Note   Cancer Staging  Malignant neoplasm of upper-outer quadrant of right breast in female, estrogen receptor positive (HCC) Staging form: Breast, AJCC 8th Edition - Clinical stage from 12/06/2021: Stage IA (cT1b, cN0, cM0, G2, ER+, PR+, HER2-) - Signed by Malachy Mood, MD on 12/11/2021 - Pathologic stage from 01/31/2022: Stage Unknown (pT1c, pNX, cM0, G2, ER+, PR+, HER2-) - Signed by Malachy Mood, MD on 02/12/2022     Malignant neoplasm of upper-outer quadrant of right breast in female, estrogen receptor positive (HCC)  11/28/2021 Mammogram   CLINICAL DATA:  Patient returns today to evaluate  possible RIGHT breast asymmetries identified on recent screening mammogram.   EXAM: DIGITAL DIAGNOSTIC UNILATERAL RIGHT MAMMOGRAM WITH TOMOSYNTHESIS AND CAD; ULTRASOUND RIGHT BREAST LIMITED  IMPRESSION: 1. Irregular hypoechoic mass in the RIGHT breast at the 10 o'clock axis, 7 cm from the nipple, measuring 9 mm, with associated architectural distortion, corresponding to the mammographic finding. This is a highly suspicious finding for which ultrasound-guided core biopsy is recommended. 2. Additional benign findings within the RIGHT breast, as detailed above. 3. No enlarged or morphologically abnormal lymph nodes in the RIGHT axilla.   12/06/2021 Cancer Staging   Staging form: Breast, AJCC 8th Edition - Clinical stage from 12/06/2021: Stage IA (cT1b, cN0, cM0, G2, ER+, PR+, HER2-) - Signed by Malachy Mood, MD on 12/11/2021 Stage prefix: Initial diagnosis Histologic grading system: 3 grade system   12/06/2021 Initial Biopsy   Diagnosis Breast, right, needle core biopsy, 10 o'clock, 7cmfn INVASIVE DUCTAL CARCINOMA WITH LOBULAR FEATURES, GRADE 2 (3+2+1)  An E-cadherin immunohistochemical stains performed with adequate control. This stain is diffusely negative within the tumor. Histologically the tumor forms ducts and is present in cribriform sheets as well as focally present in cords and small solid nests. Given this histology it is felt the negative E-cadherin stain represents aberrant absence of expression within an otherwise ductal carcinoma. This lack of E-cadherin expression may explain the focal lobular features.  PROGNOSTIC INDICATORS Results: The tumor cells are EQUIVOCAL for Her2 (2+). Her2 by FISH will be performed and the results reported separately. Estrogen Receptor: 95%, POSITIVE, STRONG STAINING INTENSITY Progesterone Receptor: 80%, POSITIVE, STRONG STAINING INTENSITY Proliferation Marker Ki67: 2%   12/10/2021 Initial Diagnosis  Malignant neoplasm of upper-outer quadrant of  right breast in female, estrogen receptor positive (HCC)    Genetic Testing   [facility] CancerNext-Expanded Panel was Negative. Of note, a variant of uncertain significance was detected in the BRCA2 gene (p.P606L). Report date is 12/24/2021.  The CancerNext-Expanded gene panel offered by [facility] and includes sequencing, rearrangement, and RNA analysis for the following 77 genes: AIP, ALK, APC, ATM, AXIN2, BAP1, BARD1, BLM, BMPR1A, BRCA1, BRCA2, BRIP1, CDC73, CDH1, CDK4, CDKN1B, CDKN2A, CHEK2, CTNNA1, DICER1, FANCC, FH, FLCN, GALNT12, KIF1B, LZTR1, MAX, MEN1, MET, MLH1, MSH2, MSH3, MSH6, MUTYH, NBN, NF1, NF2, NTHL1, PALB2, PHOX2B, PMS2, POT1, PRKAR1A, PTCH1, PTEN, RAD51C, RAD51D, RB1, RECQL, RET, SDHA, SDHAF2, SDHB, SDHC, SDHD, SMAD4, SMARCA4, SMARCB1, SMARCE1, STK11, SUFU, TMEM127, TP53, TSC1, TSC2, VHL and XRCC2 (sequencing and deletion/duplication); EGFR, EGLN1, HOXB13, KIT, MITF, PDGFRA, POLD1, and POLE (sequencing only); EPCAM and GREM1 (deletion/duplication only).    01/31/2022 Cancer Staging   Staging form: Breast, AJCC 8th Edition - Pathologic stage from 01/31/2022: Stage Unknown (pT1c, pNX, cM0, G2, ER+, PR+, HER2-) - Signed by Malachy Mood, MD on 02/12/2022 Stage prefix: Initial diagnosis Histologic grading system: 3 grade system Residual tumor (R): R0 - None   01/31/2022 Definitive Surgery   FINAL MICROSCOPIC DIAGNOSIS:   A. BREAST, RIGHT, LUMPECTOMY:  -  Invasive ductal carcinoma with lobular features, Nottingham grade 2 of 3, 1.5 cm  -  Ductal carcinoma in-situ, intermediate grade  -  Margins uninvolved by carcinoma (<0.1 cm; anterior margin)  -  Previous biopsy site changes present  -  Adenosis and fibrocystic changes  -  See oncology table and comment below   B. BREAST, RIGHT ADDITIONAL ANTERIOMEDIAL MARGIN, EXCISION:  -  Usual ductal hyperplasia  -  No residual carcinoma identified  -  See comment   C. BREAST, RIGHT ADDITIONAL ANTERIOPOSTERIOR MARGIN, EXCISION:  -  No residual  carcinoma identified   D. BREAST, RIGHT ADDITIONAL LATEROINFERIOR MARGIN, EXCISION:  -  Focal residual ductal carcinoma in situ  -  Fibrocystic changes with metaplasia  -  Margins uninvolved by carcinoma (0.1 cm; inferior margin)  -  See comment    03/2022 -  Anti-estrogen oral therapy   Tamoxifen x 5 years     CURRENT THERAPY: recently stopped tamoxifen  INTERVAL HISTORY: DOROTHIA PASSMORE 82 y.o. female returns for f/u to discuss difficulty with taking Tamoxifen.  She developed anxiety, hot flashes and arthralgias on it. She tolerated the half dose without difficulty. She has tried the aromatase inhibitors and has significant hot flashes and was unable to tolerate any of those as well.    She stopped her antiestrogen therapy all together and is not sure she wants to continue any therapy if she is going to feel poorly.    Patient Active Problem List   Diagnosis Date Noted  . Hypercoagulable state due to paroxysmal atrial fibrillation (HCC) 03/06/2023  . PCP NOTES >>>>>> 07/17/2022  . Obstructive sleep apnea syndrome 04/26/2022  . Generalized anxiety disorder 04/26/2022  . Chronic diastolic heart failure (HCC) 01/09/2022  . Left ventricular dysfunction 12/31/2021  . Pericardial effusion 12/31/2021  . Mitral regurgitation 12/31/2021  . Tricuspid regurgitation 12/31/2021  . Annual physical exam 12/28/2021  . Family history of prostate cancer 12/13/2021  . Malignant neoplasm of upper-outer quadrant of right breast in female, estrogen receptor positive (HCC) 12/10/2021  . Degenerative disc disease, cervical 03/20/2020  . Chronic anticoagulation 07/04/2015  . HOH (hard of hearing)   . Hyperlipemia   . PAF (  paroxysmal atrial fibrillation) (HCC) 06/23/2015  . HTN (hypertension)     is allergic to amlodipine besylate, buspirone, fluoxetine, morphine and codeine, penicillins, and sertraline hcl.  MEDICAL HISTORY: Past Medical History:  Diagnosis Date  . Anxiety   . Arthritis   .  Cancer [facility]) 2023   right breast  . Cataract   . CHF (congestive heart failure) (HCC)   . Dysrhythmia    A fib  . Heart murmur   . HOH (hard of hearing)   . Hyperlipidemia   . Hypertension   . PAF (paroxysmal atrial fibrillation) (HCC)    a. dx on 06/2015 admission. spontaneously converted into NSR. placed on Eliquis  . Sleep apnea    CPAP    SURGICAL HISTORY: Past Surgical History:  Procedure Laterality Date  . ABDOMINAL HYSTERECTOMY    . ATRIAL FIBRILLATION ABLATION N/A 09/10/2022   Procedure: ATRIAL FIBRILLATION ABLATION;  Surgeon: Regan Lemming, MD;  Location: [facility] INVASIVE CV LAB;  Service: Cardiovascular;  Laterality: N/A;  . BREAST BIOPSY Right 12/06/2021  . BREAST CYST EXCISION Left   . BREAST LUMPECTOMY Right 01/31/2022  . BREAST LUMPECTOMY WITH RADIOACTIVE SEED LOCALIZATION Right 01/31/2022   Procedure: RIGHT BREAST LUMPECTOMY WITH RADIOACTIVE SEED LOCALIZATION;  Surgeon: Harriette Bouillon, MD;  Location: [facility] OR;  Service: General;  Laterality: Right;  . BREAST SURGERY Left    lumpectomy 35 yrs ago  . CARDIOVERSION N/A 12/31/2021   Procedure: CARDIOVERSION;  Surgeon: Little Ishikawa, MD;  Location: [facility] ENDOSCOPY;  Service: Cardiovascular;  Laterality: N/A;  . DILATION AND CURETTAGE OF UTERUS    . NECK SURGERY     discectomy with cadaver bone  . TEE WITHOUT CARDIOVERSION N/A 12/31/2021   Procedure: TRANSESOPHAGEAL ECHOCARDIOGRAM (TEE);  Surgeon: Little Ishikawa, MD;  Location: [facility] ENDOSCOPY;  Service: Cardiovascular;  Laterality: N/A;  . TONSILLECTOMY     removed as a child  . TUBAL LIGATION      SOCIAL HISTORY: Social History   Socioeconomic History  . Marital status: Widowed    Spouse name: Not on file  . Number of children: 2  . Years of education: Not on file  . Highest education level: Not on file  Occupational History  . Occupation: Retired- [employer]  Tobacco Use  . Smoking status: Never  . Smokeless tobacco:  Never  Vaping Use  . Vaping Use: Never used  Substance and Sexual Activity  . Alcohol use: No    Alcohol/week: 0.0 standard drinks of alcohol  . Drug use: No  . Sexual activity: Not Currently  Other Topics Concern  . Not on file  Social History Narrative   Lives [place], near [place].    Daughter currently lives with her.   Social Determinants of Health   Financial Resource Strain: Low Risk  (08/15/2022)   Overall Financial Resource Strain (CARDIA)   . Difficulty of Paying Living Expenses: Not hard at all  Food Insecurity: No Food Insecurity (08/15/2022)   Hunger Vital Sign   . Worried About Programme researcher, broadcasting/film/video in the Last Year: Never true   . Ran Out of Food in the Last Year: Never true  Transportation Needs: No Transportation Needs (08/15/2022)   PRAPARE - Transportation   . Lack of Transportation (Medical): No   . Lack of Transportation (Non-Medical): No  Physical Activity: Insufficiently Active (08/15/2022)   Exercise Vital Sign   . Days of Exercise per Week: 2 days   . Minutes of Exercise  per Session: 60 min  Stress: No Stress Concern Present (08/15/2022)   [employer] of Occupational Health - Occupational Stress Questionnaire   . Feeling of Stress : Not at all  Social Connections: Moderately Isolated (08/15/2022)   Social Connection and Isolation Panel [NHANES]   . Frequency of Communication with Friends and Family: More than three times a week   . Frequency of Social Gatherings with Friends and Family: More than three times a week   . Attends Religious Services: More than 4 times per year   . Active Member of Clubs or Organizations: No   . Attends Banker Meetings: Never   . Marital Status: Widowed  Intimate Partner Violence: Not At Risk (08/15/2022)   Humiliation, Afraid, Rape, and Kick questionnaire   . Fear of Current or Ex-Partner: No   . Emotionally Abused: No   . Physically Abused: No   . Sexually Abused: No    FAMILY  HISTORY: Family History  Problem Relation Age of Onset  . Heart attack Mother 70  . CVA Mother 69  . Unexplained death Father 41  . Hypertension Sister   . Hypertension Brother   . Colon cancer Maternal Grandfather   . Prostate cancer Son 10  . Breast cancer Neg Hx     Review of Systems  Constitutional:  Negative for appetite change, chills, fatigue, fever and unexpected weight change.  HENT:   Negative for hearing loss, lump/mass and trouble swallowing.   Eyes:  Negative for eye problems and icterus.  Respiratory:  Negative for chest tightness, cough and shortness of breath.   Cardiovascular:  Negative for chest pain, leg swelling and palpitations.  Gastrointestinal:  Negative for abdominal distention, abdominal pain, constipation, diarrhea, nausea and vomiting.  Endocrine: Negative for hot flashes.  Genitourinary:  Negative for difficulty urinating.   Musculoskeletal:  Negative for arthralgias.  Skin:  Negative for itching and rash.  Neurological:  Negative for dizziness, extremity weakness, headaches and numbness.  Hematological:  Negative for adenopathy. Does not bruise/bleed easily.  Psychiatric/Behavioral:  Negative for depression. The patient is not nervous/anxious.   All other systems reviewed and are negative.     PHYSICAL EXAMINATION Sounds well in no apparent distress    ASSESSMENT and THERAPY PLAN:   No problem-specific Assessment & Plan notes found for this encounter.  Follow up instructions:    -Return to cancer center ***  -Mammogram due in *** -Follow up with surgery ***   The patient was provided an opportunity to ask questions and all were answered. The patient agreed with the plan and demonstrated an understanding of the instructions.   The patient was advised to call back or seek an in-person evaluation if the symptoms worsen or if the condition fails to improve as anticipated.   I provided *** minutes of {Blank single:19197::"face-to-face video  visit time","non face-to-face telephone visit time"} during this encounter, and > 50% was spent counseling as documented under my assessment & plan.  Lillard Anes, NP 04/22/23 3:40 PM Medical Oncology and Hematology [facility] [address] [place], [place] [address] Tel. [phone]    Fax. [phone]

## 2023-04-23 ENCOUNTER — Ambulatory Visit: Payer: Medicare PPO | Attending: Cardiology | Admitting: Cardiology

## 2023-04-23 ENCOUNTER — Encounter: Payer: Self-pay | Admitting: Cardiology

## 2023-04-23 VITALS — BP 124/72 | HR 73 | Ht 66.0 in | Wt 209.0 lb

## 2023-04-23 DIAGNOSIS — M1712 Unilateral primary osteoarthritis, left knee: Secondary | ICD-10-CM | POA: Diagnosis not present

## 2023-04-23 DIAGNOSIS — D6869 Other thrombophilia: Secondary | ICD-10-CM | POA: Diagnosis not present

## 2023-04-23 DIAGNOSIS — I480 Paroxysmal atrial fibrillation: Secondary | ICD-10-CM | POA: Diagnosis not present

## 2023-04-23 NOTE — Patient Instructions (Signed)
Medication Instructions:  The current medical regimen is effective;  continue present plan and medications.  *If you need a refill on your cardiac medications before your next appointment, please call your pharmacy*  Follow-Up: At [facility], you and your health needs are our priority.  As part of our continuing mission to provide you with exceptional heart care, we have created designated Provider Care Teams.  These Care Teams include your primary Cardiologist (physician) and Advanced Practice Providers (APPs -  Physician Assistants and Nurse Practitioners) who all work together to provide you with the care you need, when you need it.  We recommend signing up for the patient portal called "MyChart".  Sign up information is provided on this After Visit Summary.  MyChart is used to connect with patients for Virtual Visits (Telemedicine).  Patients are able to view lab/test results, encounter notes, upcoming appointments, etc.  Non-urgent messages can be sent to your provider as well.   To learn more about what you can do with MyChart, go to [email or web address].    Your next appointment:   1 year(s)  Provider:   Mark Skains, MD      

## 2023-04-23 NOTE — Progress Notes (Signed)
  Cardiology Office Note:  .   Date:  04/23/2023  ID:  Shannon Liu, DOB 07/28/1941, MRN [id number] PCP: Wanda Plump, MD  [facility] Providers Cardiologist:  Donato Schultz, MD Electrophysiologist:  Will Jorja Loa, MD    History of Present Illness: .   Shannon Liu is a 82 y.o. female here for follow-up atrial fibrillation status post ablation 2023 Dr. Elberta Fortis.  Eliquis.  Did have some A-fib back in May 2024.  She took extra diltiazem and went back to sinus rhythm.  Skin cancer Breast CA - no chemo rad. Hard to tolerate estrogen blocker. OSA-CPAP Echo 2024-EF 45-50.  Sister 53 in hospice   ROS: No fevers chills nausea vomiting syncope  Studies Reviewed: Marland Kitchen        AS above Risk Assessment/Calculations:    CHA2DS2-VASc Score = 5   This indicates a 7.2% annual risk of stroke. The patient's score is based upon: CHF History: 1 HTN History: 1 Diabetes History: 0 Stroke History: 0 Vascular Disease History: 0 Age Score: 2 Gender Score: 1            Physical Exam:   VS:  BP 124/72   Pulse 73   Ht 5\' 6"  (1.676 m)   Wt 209 lb (94.8 kg)   SpO2 97%   BMI 33.73 kg/m    Wt Readings from Last 3 Encounters:  04/23/23 209 lb (94.8 kg)  03/19/23 209 lb (94.8 kg)  03/06/23 209 lb 6.4 oz (95 kg)    GEN: Well nourished, well developed in no acute distress NECK: No JVD; No carotid bruits CARDIAC: RRR, no murmurs, rubs, gallops RESPIRATORY:  Clear to auscultation without rales, wheezing or rhonchi  ABDOMEN: Soft, non-tender, non-distended EXTREMITIES:  trace edema; No deformity   ASSESSMENT AND PLAN: .    PAF - Ablation 2023 November 28 -Brief episode which converted with diltiazem  Chronic anticoagulation - Continue with Eliquis Toprol.  Diltiazem as needed.  OSA - CPAP  Hypertension - Amlodipine now at 5 mg.  Trace edema.  Chronic reduced HFrEF -EF 45 to 50% stable.       Dispo: 1 year  Signed, Donato Schultz, MD

## 2023-04-24 ENCOUNTER — Encounter: Payer: Self-pay | Admitting: Adult Health

## 2023-04-24 ENCOUNTER — Telehealth: Payer: Self-pay | Admitting: Adult Health

## 2023-04-24 NOTE — Telephone Encounter (Signed)
Scheduled and cancelled appointment per 7/9 los. Patient is aware of the cancelled and scheduled appointment.

## 2023-04-24 NOTE — Assessment & Plan Note (Signed)
Shannon Liu is an 82 year old woman with stage Ia ER/PR positive right-sided breast cancer diagnosed in February 2023 status postlumpectomy followed by antiestrogen therapy which she has had difficulty tolerating.  #1 right breast cancer: She has no clinical or radiographic signs of breast cancer recurrence.  She has been unable to tolerate any antiestrogen therapy with the exception of tamoxifen at 10mg .  She and I discussed her taking the 10mg  tamoxifen, or nothing at all.  Right now she is going to think about it, but isn't sure if she will proceed due to the lack of data that supports the lower dose with invasive breast cancer.  She understands that without antiestrogen therapy her recurrence risk is slightly increased, however her quality of life is much improved without the medication, which makes her more able to be active, and have energy to do the things she enjoys.    She will f/u with Dr. Mosetta Putt in 08/2023.  She knows to call for any questions or concerns prior to her next appointment with [place].

## 2023-05-02 DIAGNOSIS — D485 Neoplasm of uncertain behavior of skin: Secondary | ICD-10-CM | POA: Diagnosis not present

## 2023-05-02 DIAGNOSIS — D225 Melanocytic nevi of trunk: Secondary | ICD-10-CM | POA: Diagnosis not present

## 2023-05-02 DIAGNOSIS — Z1283 Encounter for screening for malignant neoplasm of skin: Secondary | ICD-10-CM | POA: Diagnosis not present

## 2023-05-05 DIAGNOSIS — C44319 Basal cell carcinoma of skin of other parts of face: Secondary | ICD-10-CM | POA: Diagnosis not present

## 2023-05-09 ENCOUNTER — Ambulatory Visit: Payer: Medicare PPO | Admitting: Adult Health

## 2023-05-12 ENCOUNTER — Encounter: Payer: Self-pay | Admitting: Internal Medicine

## 2023-05-12 DIAGNOSIS — L989 Disorder of the skin and subcutaneous tissue, unspecified: Secondary | ICD-10-CM

## 2023-05-13 DIAGNOSIS — C44319 Basal cell carcinoma of skin of other parts of face: Secondary | ICD-10-CM | POA: Diagnosis not present

## 2023-05-13 NOTE — Addendum Note (Signed)
Addended by: Conrad Summerdale D on: 05/13/2023 12:41 PM   Modules accepted: Orders

## 2023-05-13 NOTE — Telephone Encounter (Signed)
Enter a order for a chest x-ray, DX abnormal skin exam

## 2023-05-13 NOTE — Telephone Encounter (Signed)
Order placed

## 2023-06-01 ENCOUNTER — Other Ambulatory Visit: Payer: Self-pay | Admitting: Cardiology

## 2023-06-03 MED ORDER — ENTRESTO 49-51 MG PO TABS
1.0000 | ORAL_TABLET | Freq: Two times a day (BID) | ORAL | 3 refills | Status: DC
  Filled 2023-06-12: qty 180, 90d supply, fill #0
  Filled 2023-09-03: qty 180, 90d supply, fill #1
  Filled 2023-12-04: qty 180, 90d supply, fill #2
  Filled 2024-03-03: qty 180, 90d supply, fill #3

## 2023-06-04 ENCOUNTER — Ambulatory Visit (HOSPITAL_BASED_OUTPATIENT_CLINIC_OR_DEPARTMENT_OTHER)
Admission: RE | Admit: 2023-06-04 | Discharge: 2023-06-04 | Disposition: A | Discharge status: Home / Self Care | Payer: Medicare PPO | Source: Ambulatory Visit | Attending: Internal Medicine | Admitting: Internal Medicine

## 2023-06-04 DIAGNOSIS — C50919 Malignant neoplasm of unspecified site of unspecified female breast: Secondary | ICD-10-CM | POA: Diagnosis not present

## 2023-06-04 DIAGNOSIS — L989 Disorder of the skin and subcutaneous tissue, unspecified: Secondary | ICD-10-CM | POA: Diagnosis not present

## 2023-06-04 DIAGNOSIS — I700 Atherosclerosis of aorta: Secondary | ICD-10-CM | POA: Diagnosis not present

## 2023-06-11 ENCOUNTER — Other Ambulatory Visit (HOSPITAL_COMMUNITY): Payer: Self-pay

## 2023-06-12 ENCOUNTER — Other Ambulatory Visit (HOSPITAL_COMMUNITY): Payer: Self-pay

## 2023-06-12 ENCOUNTER — Other Ambulatory Visit: Payer: Self-pay

## 2023-06-12 ENCOUNTER — Other Ambulatory Visit: Payer: Self-pay | Admitting: Internal Medicine

## 2023-06-12 MED ORDER — POTASSIUM CHLORIDE ER 10 MEQ PO TBCR
10.0000 meq | EXTENDED_RELEASE_TABLET | Freq: Two times a day (BID) | ORAL | 1 refills | Status: DC
  Filled 2023-06-12: qty 180, 90d supply, fill #0
  Filled 2023-09-03: qty 180, 90d supply, fill #1

## 2023-06-12 MED ORDER — VITAMIN D3 50 MCG (2000 UT) PO TABS: 2000.0000 [IU] | ORAL_TABLET | Freq: Every day | ORAL | 99 refills | Status: DC

## 2023-06-12 MED FILL — Apixaban Tab 5 MG: ORAL | 90 days supply | Qty: 180 | Fill #0 | Status: AC

## 2023-06-12 MED FILL — Alprazolam Tab 0.25 MG: ORAL | 30 days supply | Qty: 30 | Fill #0 | Status: AC

## 2023-06-12 MED FILL — Metoprolol Succinate Tab ER 24HR 25 MG (Tartrate Equiv): ORAL | 90 days supply | Qty: 90 | Fill #0 | Status: AC

## 2023-06-12 MED FILL — Rosuvastatin Calcium Tab 10 MG: ORAL | 90 days supply | Qty: 90 | Fill #0 | Status: AC

## 2023-06-13 ENCOUNTER — Other Ambulatory Visit: Payer: Self-pay

## 2023-06-13 ENCOUNTER — Other Ambulatory Visit (HOSPITAL_COMMUNITY): Payer: Self-pay

## 2023-06-20 ENCOUNTER — Ambulatory Visit (HOSPITAL_COMMUNITY)
Admission: RE | Admit: 2023-06-20 | Discharge: 2023-06-20 | Disposition: A | Discharge status: Home / Self Care | Payer: Medicare PPO | Source: Ambulatory Visit | Attending: Physician Assistant | Admitting: Physician Assistant

## 2023-06-20 ENCOUNTER — Encounter: Payer: Self-pay | Admitting: Internal Medicine

## 2023-06-20 ENCOUNTER — Other Ambulatory Visit (HOSPITAL_COMMUNITY): Payer: Self-pay

## 2023-06-20 ENCOUNTER — Other Ambulatory Visit: Payer: Self-pay

## 2023-06-20 VITALS — BP 148/80 | HR 59 | Ht 66.0 in | Wt 211.0 lb

## 2023-06-20 DIAGNOSIS — I4891 Unspecified atrial fibrillation: Secondary | ICD-10-CM | POA: Diagnosis present

## 2023-06-20 DIAGNOSIS — Z6834 Body mass index (BMI) 34.0-34.9, adult: Secondary | ICD-10-CM | POA: Insufficient documentation

## 2023-06-20 DIAGNOSIS — E669 Obesity, unspecified: Secondary | ICD-10-CM | POA: Diagnosis not present

## 2023-06-20 DIAGNOSIS — D6869 Other thrombophilia: Secondary | ICD-10-CM | POA: Diagnosis not present

## 2023-06-20 DIAGNOSIS — I110 Hypertensive heart disease with heart failure: Secondary | ICD-10-CM | POA: Diagnosis not present

## 2023-06-20 DIAGNOSIS — Z7901 Long term (current) use of anticoagulants: Secondary | ICD-10-CM | POA: Diagnosis present

## 2023-06-20 DIAGNOSIS — I480 Paroxysmal atrial fibrillation: Secondary | ICD-10-CM | POA: Insufficient documentation

## 2023-06-20 DIAGNOSIS — G4733 Obstructive sleep apnea (adult) (pediatric): Secondary | ICD-10-CM | POA: Diagnosis not present

## 2023-06-20 DIAGNOSIS — I5022 Chronic systolic (congestive) heart failure: Secondary | ICD-10-CM | POA: Diagnosis not present

## 2023-06-20 DIAGNOSIS — I10 Essential (primary) hypertension: Secondary | ICD-10-CM | POA: Diagnosis present

## 2023-06-20 MED ORDER — METOPROLOL SUCCINATE ER 25 MG PO TB24
12.5000 mg | ORAL_TABLET | Freq: Every day | ORAL | 2 refills | Status: DC
  Filled 2023-06-20 – 2023-09-03 (×2): qty 45, 90d supply, fill #0

## 2023-06-20 NOTE — Progress Notes (Signed)
Primary Care Physician: Wanda Plump, MD Primary Cardiologist: Dr Anne Fu Primary Electrophysiologist: Dr Elberta Fortis  Referring Physician: Dr Marko Stai is a 82 y.o. female with a history of HTN, HLD, OSA, breast cancer, HFrEF, atrial fibrillation who presents for follow up in the [facility]. Patient is s/p afib ablation with Dr Elberta Fortis on 09/10/22. Patient is on Eliquis for a CHADS2VASC score of 5.  Patient reported that she went back into afib overnight on 03/01/23. She called the on call team and was instructed to take her PRN diltiazem every 4 hours. She did go back to SR after ~24 hours.   On follow up today, patient reports that she has done well since her last visit. She denies any tachypalpitations or symptoms of afib. She has noted some heart rates in the 40's associated with fatigue. No bleeding issues on anticoagulation.   Today, she denies symptoms of palpitations, chest pain, shortness of breath, orthopnea, PND, lower extremity edema, dizziness, presyncope, syncope, snoring, daytime somnolence, bleeding, or neurologic sequela. The patient is tolerating medications without difficulties and is otherwise without complaint today.    Atrial Fibrillation Risk Factors:  she does have symptoms or diagnosis of sleep apnea. she is compliant with CPAP therapy. she does not have a history of rheumatic fever. she does not have a history of alcohol use.   Atrial Fibrillation Management history:  Previous antiarrhythmic drugs: none Previous cardioversions: 12/31/21 Previous ablations: 09/10/22 Anticoagulation history: Eliquis   Past Medical History:  Diagnosis Date   Anxiety    Arthritis    Cancer (HCC) 2023   right breast   Cataract    CHF (congestive heart failure) (HCC)    Dysrhythmia    A fib   Heart murmur    HOH (hard of hearing)    Hyperlipidemia    Hypertension    PAF (paroxysmal atrial fibrillation) (HCC)    a. dx on 06/2015  admission. spontaneously converted into NSR. placed on Eliquis   Sleep apnea    CPAP     Current Outpatient Medications  Medication Sig Dispense Refill   acetaminophen (TYLENOL) 500 MG tablet Take 500 mg by mouth as needed for mild pain.     ALPRAZolam (XANAX) 0.25 MG tablet Take 1 tablet (0.25 mg total) by mouth at bedtime as needed for anxiety. 30 tablet 2   amLODipine (NORVASC) 5 MG tablet Take 1 tablet (5 mg total) by mouth daily. 90 tablet 1   apixaban (ELIQUIS) 5 MG TABS tablet Take 1 tablet (5 mg total) by mouth 2 (two) times daily. 180 tablet 1   Cholecalciferol (VITAMIN D) 50 MCG (2000 UT) CAPS Take 2,000 Units by mouth daily.     diltiazem (CARDIZEM) 30 MG tablet Take 1 tablet every 4 hours AS NEEDED for AFIB heart rate >100 45 tablet 3   furosemide (LASIX) 20 MG tablet Take 1 tablet (20 mg total) by mouth daily. 90 tablet 3   Lactobacillus (PROBIOTIC ACIDOPHILUS PO) Take 1 tablet by mouth daily.     metoprolol succinate (TOPROL-XL) 25 MG 24 hr tablet Take 1 tablet (25 mg total) by mouth daily. with or immediately following a meal. 90 tablet 3   potassium chloride (KLOR-CON) 10 MEQ tablet Take 1 tablet (10 mEq total) by mouth 2 (two) times daily. 180 tablet 1   Propylene Glycol 0.6 % SOLN Place 1 drop into both eyes at bedtime. systane eye drops     rosuvastatin (CRESTOR)  10 MG tablet Take 1 tablet (10 mg total) by mouth at bedtime. 90 tablet 1   sacubitril-valsartan (ENTRESTO) 49-51 MG Take 1 tablet by mouth 2 (two) times daily. 180 tablet 3   clindamycin (CLINDAGEL) 1 % gel Apply topically twice daily (Patient not taking: Reported on 06/20/2023) 60 g 3   tamoxifen (NOLVADEX) 20 MG tablet Take 1 tablet (20 mg total) by mouth daily. (Patient not taking: Reported on 06/20/2023) 90 tablet 0   No current facility-administered medications for this encounter.    ROS- All systems are reviewed and negative except as per the HPI above.  Physical Exam: Vitals:   06/20/23 1054  BP: (!)  148/80  Pulse: (!) 59  Weight: 95.7 kg  Height: 5\' 6"  (1.676 m)     GEN: Well nourished, well developed in no acute distress NECK: No JVD; No carotid bruits CARDIAC: Regular rate and rhythm, no murmurs, rubs, gallops RESPIRATORY:  Clear to auscultation without rales, wheezing or rhonchi  ABDOMEN: Soft, non-tender, non-distended EXTREMITIES:  No edema; No deformity    Wt Readings from Last 3 Encounters:  06/20/23 95.7 kg  04/23/23 94.8 kg  03/19/23 94.8 kg    EKG today demonstrates  SR, PACs Vent. rate 59 BPM PR interval 172 ms QRS duration 68 ms QT/QTcB 424/419 ms  Echo 11/26/22 demonstrated  1. Left ventricular ejection fraction, by estimation, is 45 to 50%. The  left ventricle has mildly decreased function. The left ventricle  demonstrates global hypokinesis. Left ventricular diastolic parameters are  consistent with Grade I diastolic dysfunction (impaired relaxation).   2. Right ventricular systolic function is normal. The right ventricular  size is normal. There is normal pulmonary artery systolic pressure.   3. The mitral valve is normal in structure. Trivial mitral valve  regurgitation. No evidence of mitral stenosis.   4. The aortic valve was not well visualized. Aortic valve regurgitation  is not visualized. No aortic stenosis is present.   5. Aortic dilatation noted. There is mild dilatation of the ascending  aorta, measuring 37 mm.   6. The inferior vena cava is normal in size with greater than 50%  respiratory variability, suggesting right atrial pressure of 3 mmHg.   Epic records are reviewed at length today.  CHA2DS2-VASc Score = 5  The patient's score is based upon: CHF History: 1 HTN History: 1 Diabetes History: 0 Stroke History: 0 Vascular Disease History: 0 Age Score: 2 Gender Score: 1       ASSESSMENT AND PLAN: Paroxysmal Atrial Fibrillation (ICD10:  I48.0) The patient's CHA2DS2-VASc score is 5, indicating a 7.2% annual risk of stroke.    S/p afib ablation 09/10/22 Patient appears to be maintaining SR Could consider AAD or repeat ablation if she has more frequent episodes.  Continue Eliquis 5 mg BID Decrease Toprol to 12.5 mg daily given bradycardia and fatigue.  Continue diltiazem 30 mg PRN q 4 hours for heart racing.   Secondary Hypercoagulable State (ICD10:  D68.69) The patient is at significant risk for stroke/thromboembolism based upon her CHA2DS2-VASc Score of 5.  Continue Apixaban (Eliquis).   Obesity Body mass index is 34.06 kg/m.  Encouraged lifestyle modification  OSA  Encouraged nightly CPAP  HTN Stable on current regimen  Chronic HFrEF EF mildly improved, now 45-50% Fluid status appears stable   Follow up in the AF clinic in 6 months.    Jorja Loa PA-C [facility] [address] [place], [place] [address] [phone] 06/20/2023 11:35 AM

## 2023-06-20 NOTE — Patient Instructions (Signed)
Decrease metoprolol to 1/2 tablet once a day (12.5mg  daily)

## 2023-07-02 DIAGNOSIS — G4733 Obstructive sleep apnea (adult) (pediatric): Secondary | ICD-10-CM | POA: Diagnosis not present

## 2023-07-28 ENCOUNTER — Ambulatory Visit: Payer: Medicare PPO | Admitting: Internal Medicine

## 2023-07-28 ENCOUNTER — Encounter: Payer: Self-pay | Admitting: Internal Medicine

## 2023-07-28 VITALS — BP 138/80 | HR 84 | Temp 98.1°F | Resp 16 | Ht 66.0 in | Wt 212.1 lb

## 2023-07-28 DIAGNOSIS — E559 Vitamin D deficiency, unspecified: Secondary | ICD-10-CM

## 2023-07-28 DIAGNOSIS — I5032 Chronic diastolic (congestive) heart failure: Secondary | ICD-10-CM | POA: Diagnosis not present

## 2023-07-28 DIAGNOSIS — I480 Paroxysmal atrial fibrillation: Secondary | ICD-10-CM

## 2023-07-28 DIAGNOSIS — R5383 Other fatigue: Secondary | ICD-10-CM | POA: Diagnosis not present

## 2023-07-28 DIAGNOSIS — M25562 Pain in left knee: Secondary | ICD-10-CM | POA: Diagnosis not present

## 2023-07-28 DIAGNOSIS — I10 Essential (primary) hypertension: Secondary | ICD-10-CM

## 2023-07-28 DIAGNOSIS — M545 Low back pain, unspecified: Secondary | ICD-10-CM | POA: Diagnosis not present

## 2023-07-28 LAB — CBC WITH DIFFERENTIAL/PLATELET
Basophils Absolute: 0.1 10*3/uL (ref 0.0–0.1)
Basophils Relative: 1 % (ref 0.0–3.0)
Eosinophils Absolute: 0.2 10*3/uL (ref 0.0–0.7)
Eosinophils Relative: 2.7 % (ref 0.0–5.0)
HCT: 42.2 % (ref 36.0–46.0)
Hemoglobin: 13.7 g/dL (ref 12.0–15.0)
Lymphocytes Relative: 28.2 % (ref 12.0–46.0)
Lymphs Abs: 1.7 10*3/uL (ref 0.7–4.0)
MCHC: 32.5 g/dL (ref 30.0–36.0)
MCV: 91.8 fL (ref 78.0–100.0)
Monocytes Absolute: 0.8 10*3/uL (ref 0.1–1.0)
Monocytes Relative: 13.3 % — ABNORMAL HIGH (ref 3.0–12.0)
Neutro Abs: 3.4 10*3/uL (ref 1.4–7.7)
Neutrophils Relative %: 54.8 % (ref 43.0–77.0)
Platelets: 234 10*3/uL (ref 150.0–400.0)
RBC: 4.6 Mil/uL (ref 3.87–5.11)
RDW: 14 % (ref 11.5–15.5)
WBC: 6.2 10*3/uL (ref 4.0–10.5)

## 2023-07-28 LAB — BASIC METABOLIC PANEL
BUN: 19 mg/dL (ref 6–23)
CO2: 28 meq/L (ref 19–32)
Calcium: 9.6 mg/dL (ref 8.4–10.5)
Chloride: 105 meq/L (ref 96–112)
Creatinine, Ser: 0.73 mg/dL (ref 0.40–1.20)
GFR: 76.84 mL/min (ref >60.00)
Glucose, Bld: 113 mg/dL — ABNORMAL HIGH (ref 70–99)
Potassium: 3.8 meq/L (ref 3.5–5.1)
Sodium: 142 meq/L (ref 135–145)

## 2023-07-28 LAB — B12 AND FOLATE PANEL
Folate: 13.9 ng/mL (ref >5.9)
Vitamin B-12: 356 pg/mL (ref 211–911)

## 2023-07-28 LAB — VITAMIN D 25 HYDROXY (VIT D DEFICIENCY, FRACTURES): VITD: 30.58 ng/mL (ref 30.00–100.00)

## 2023-07-28 NOTE — Patient Instructions (Addendum)
Vaccines I recommend: Covid booster RSV vaccine   Continue checking your blood pressure regularly Blood pressure goal:  between 110/65 and  135/85. If it is consistently higher or lower, let me know     GO TO THE LAB : Get the blood work     Next visit with me in 3 to 4 months for a physical exam Please schedule it at the front desk

## 2023-07-28 NOTE — Progress Notes (Unsigned)
Subjective:    Patient ID: Shannon Liu, female    DOB: 1941/01/08, 82 y.o.   MRN: [id number]  DOS:  07/28/2023 Type of visit - description: ROV  Routine follow-up, she said that she feels a lot better compared to few months ago. She still does yard work, she does get very tired after she does that Denies chest pain. Has occasional palpitations but not with exertion. Denies cough or wheezing.  Ambulatory BPs are all WNL.  O2 sats ambulatory satting 97%.  Review of Systems See above   Past Medical History:  Diagnosis Date   Anxiety    Arthritis    Cancer (HCC) 2023   right breast   Cataract    CHF (congestive heart failure) (HCC)    Dysrhythmia    A fib   Heart murmur    HOH (hard of hearing)    Hyperlipidemia    Hypertension    PAF (paroxysmal atrial fibrillation) (HCC)    a. dx on 06/2015 admission. spontaneously converted into NSR. placed on Eliquis   Sleep apnea    CPAP    Past Surgical History:  Procedure Laterality Date   ABDOMINAL HYSTERECTOMY     ATRIAL FIBRILLATION ABLATION N/A 09/10/2022   Procedure: ATRIAL FIBRILLATION ABLATION;  Surgeon: Regan Lemming, MD;  Location: [facility] INVASIVE CV LAB;  Service: Cardiovascular;  Laterality: N/A;   BREAST BIOPSY Right 12/06/2021   BREAST CYST EXCISION Left    BREAST LUMPECTOMY Right 01/31/2022   BREAST LUMPECTOMY WITH RADIOACTIVE SEED LOCALIZATION Right 01/31/2022   Procedure: RIGHT BREAST LUMPECTOMY WITH RADIOACTIVE SEED LOCALIZATION;  Surgeon: Harriette Bouillon, MD;  Location: [facility] OR;  Service: General;  Laterality: Right;   BREAST SURGERY Left    lumpectomy 35 yrs ago   CARDIOVERSION N/A 12/31/2021   Procedure: CARDIOVERSION;  Surgeon: Little Ishikawa, MD;  Location: [facility] ENDOSCOPY;  Service: Cardiovascular;  Laterality: N/A;   DILATION AND CURETTAGE OF UTERUS     NECK SURGERY     discectomy with cadaver bone   TEE WITHOUT CARDIOVERSION N/A 12/31/2021   Procedure: TRANSESOPHAGEAL ECHOCARDIOGRAM (TEE);   Surgeon: Little Ishikawa, MD;  Location: [facility] ENDOSCOPY;  Service: Cardiovascular;  Laterality: N/A;   TONSILLECTOMY     removed as a child   TUBAL LIGATION      Current Outpatient Medications  Medication Instructions   acetaminophen (TYLENOL) 500 mg, Oral, As needed   ALPRAZolam (XANAX) 0.25 MG tablet Take 1 tablet (0.25 mg total) by mouth at bedtime as needed for anxiety.   amLODipine (NORVASC) 5 mg, Oral, Daily   diltiazem (CARDIZEM) 30 MG tablet Take 1 tablet every 4 hours AS NEEDED for AFIB heart rate >100   Eliquis 5 mg, Oral, 2 times daily   furosemide (LASIX) 20 mg, Oral, Daily   Lactobacillus (PROBIOTIC ACIDOPHILUS PO) 1 tablet, Oral, Daily   metoprolol succinate (TOPROL-XL) 12.5 mg, Oral, Daily, with or immediately following a meal.   potassium chloride (KLOR-CON) 10 MEQ tablet 10 mEq, Oral, 2 times daily   Propylene Glycol 0.6 % SOLN 1 drop, Both Eyes, Daily at bedtime, systane eye drops   rosuvastatin (CRESTOR) 10 mg, Oral, Daily at bedtime   sacubitril-valsartan (ENTRESTO) 49-51 MG 1 tablet, Oral, 2 times daily   tamoxifen (NOLVADEX) 20 mg, Oral, Daily   Vitamin D 2,000 Units, Oral, Daily       Objective:   Physical Exam BP 138/80   Pulse 84   Temp 98.1 F (36.7 C) (Oral)  Resp 16   Ht 5\' 6"  (1.676 m)   Wt 212 lb 2 oz (96.2 kg)   SpO2 96%   BMI 34.24 kg/m  General:   Well developed, NAD, BMI noted. HEENT:  Normocephalic . Face symmetric, atraumatic Neck: No JVD Lungs:  CTA B Normal respiratory effort, no intercostal retractions, no accessory muscle use. Heart: RRR,  no murmur.  Lower extremities: no pretibial edema bilaterally  Skin: Not pale. Not jaundice Neurologic:  alert & oriented X3.  Speech normal, gait appropriate for age and unassisted Psych--  Cognition and judgment appear intact.  Cooperative with normal attention span and concentration.  Behavior appropriate. No anxious or depressed appearing.      Assessment   Assessment (new  patient 07-2022, previous PCP Dr. Kirby Funk retired) Prediabetes HTN High cholesterol CV: -- CHF  -- Atrial fibrillation dx ~ 2016 -Ablation 08-2022 OSA, on CPAP dx 2021 Anxiety (chronic xanax prn) History of R breast cancer (+ lumpectomy April 2023, no chemo, no XRT) DJD  Dermatology: Dr. Margo Aye  PLAN Prediabetes: Last A1c very good. CHF, A-fib: Saw cardiology 07/21/2023, no changes made.  Next OV 1 year.  Check BMP and CBC HTN: Reports very good ambulatory BPs, continue amlodipine, Cardizem, Lasix, metoprolol, KCl, Entresto.  Checking labs BCC: Since LOV he saw dermatology.  2 cancers removed, no melanoma. Fatigue: Some lack of energy, still able to do yard work, suspect multifactorial, recommend to reach out to cardiology if chest pain, increase symptoms, swelling, palpitations.  Checking labs today. Vitamin D deficiency?  On supplement, checking vitamin levels.  History of breast cancer: Self d/c Nolvadex d/t  aches and pains, plans to discuss with oncology. Preventive care: had a flu shot rec a covid booster RTC 3 to 4 months CPX

## 2023-07-29 NOTE — Assessment & Plan Note (Signed)
Prediabetes: Last A1c very good. CHF, A-fib: Saw cardiology 07/21/2023, no changes made.  Next OV 1 year.  Check BMP and CBC HTN: Reports very good ambulatory BPs, continue amlodipine, Cardizem, Lasix, metoprolol, KCl, Entresto.  Checking labs BCC: Since LOV he saw dermatology.  2 cancers removed, no melanoma. Fatigue: Some lack of energy, still able to do yard work, suspect multifactorial, recommend to reach out to cardiology if chest pain, increase symptoms, swelling, palpitations.  Checking labs today. Vitamin D deficiency?  On supplement, checking vitamin levels.  History of breast cancer: Self d/c Nolvadex d/t  aches and pains, plans to discuss with oncology. Preventive care: had a flu shot rec a covid booster RTC 3 to 4 months CPX

## 2023-08-02 DIAGNOSIS — M545 Low back pain, unspecified: Secondary | ICD-10-CM | POA: Diagnosis not present

## 2023-08-22 DIAGNOSIS — M545 Low back pain, unspecified: Secondary | ICD-10-CM | POA: Diagnosis not present

## 2023-08-27 ENCOUNTER — Encounter: Payer: Self-pay | Admitting: Hematology

## 2023-08-27 ENCOUNTER — Inpatient Hospital Stay: Payer: Medicare PPO | Attending: Hematology | Admitting: Hematology

## 2023-08-27 VITALS — BP 153/77 | HR 70 | Temp 98.3°F | Resp 18 | Ht 66.0 in | Wt 212.6 lb

## 2023-08-27 DIAGNOSIS — Z79810 Long term (current) use of selective estrogen receptor modulators (SERMs): Secondary | ICD-10-CM | POA: Diagnosis not present

## 2023-08-27 DIAGNOSIS — Z1721 Progesterone receptor positive status: Secondary | ICD-10-CM | POA: Diagnosis not present

## 2023-08-27 DIAGNOSIS — Z79899 Other long term (current) drug therapy: Secondary | ICD-10-CM | POA: Insufficient documentation

## 2023-08-27 DIAGNOSIS — Z170 Estrogen receptor positive status [ER+]: Secondary | ICD-10-CM | POA: Diagnosis not present

## 2023-08-27 DIAGNOSIS — C50411 Malignant neoplasm of upper-outer quadrant of right female breast: Secondary | ICD-10-CM | POA: Insufficient documentation

## 2023-08-27 NOTE — Progress Notes (Signed)
[facility]   [phone] [phone]   Clinic Follow up Note   Patient Care Team: Wanda Plump, MD as PCP - General (Internal Medicine) Jake Bathe, MD as PCP - Cardiology (Cardiology) Regan Lemming, MD as PCP - Electrophysiology (Cardiology) Harriette Bouillon, MD as Consulting Physician (General Surgery) Malachy Mood, MD as Consulting Physician (Hematology) Dorothy Puffer, MD as Consulting Physician (Radiation Oncology) Kerin Salen, MD as Consulting Physician (Gastroenterology)  Date of Service:  08/27/2023  CHIEF COMPLAINT: f/u of breast cancer  CURRENT THERAPY:  Cancer surveillance  Oncology History   Malignant neoplasm of upper-outer quadrant of right breast in female, estrogen receptor positive (HCC) invasive ductal carcinoma with lobular features, stage IA, pT1c, cN0, ER+/PR+/HER2-, Grade 2  -found on screening mammogram. S/p right lumpectomy on 01/31/22 with Dr. Luisa Hart, path showed 1.5 cm IDC with lobular features and DCIS, negative margins. -given her early stage disease and advanced age, we opted to forego adjuvant radiation.  -she started tamoxifen in 02/2022, discontinued due to increased anxiety. Switched to anastrozole 06/2022, did not tolerate well and switched back to tamoxifen at reduced dose 10 mg daily which she tolerats well.   Assessment and Plan    Breast Cancer (Post-Treatment) Follow-up for early-stage breast cancer treated with lumpectomy in April 2023. Currently on tamoxifen, reduced to half dose due to joint pain. Tried anastrozole and exemestane but found tamoxifen more tolerable.  She prefers to continue half dose due to joint pain. Mammogram due in January 2025. - Continue tamoxifen at half dose for five years - Order mammogram for January 2025 - Schedule follow-up every six months -Last bone density was normal in August 2023  Atrial Fibrillation Atrial fibrillation managed with metoprolol. Experienced increased  symptoms when dose was reduced. Well-managed on 25 mg metoprolol daily. - Continue metoprolol 25 mg daily  General Health Maintenance Routine health maintenance discussed. Performs regular self-breast exams and had recent labs with primary care physician, which were normal. - Continue annual mammograms - Continue regular self-breast exams - Follow-up with primary care physician as needed  Follow-up - Schedule follow-up visit with nurse practitioner in six months.    -Continue tamoxifen 10 mg daily     SUMMARY OF ONCOLOGIC HISTORY: Oncology History Overview Note   Cancer Staging  Malignant neoplasm of upper-outer quadrant of right breast in female, estrogen receptor positive (HCC) Staging form: Breast, AJCC 8th Edition - Clinical stage from 12/06/2021: Stage IA (cT1b, cN0, cM0, G2, ER+, PR+, HER2-) - Signed by Malachy Mood, MD on 12/11/2021 - Pathologic stage from 01/31/2022: Stage Unknown (pT1c, pNX, cM0, G2, ER+, PR+, HER2-) - Signed by Malachy Mood, MD on 02/12/2022     Malignant neoplasm of upper-outer quadrant of right breast in female, estrogen receptor positive (HCC)  11/28/2021 Mammogram   CLINICAL DATA:  Patient returns today to evaluate possible RIGHT breast asymmetries identified on recent screening mammogram.   EXAM: DIGITAL DIAGNOSTIC UNILATERAL RIGHT MAMMOGRAM WITH TOMOSYNTHESIS AND CAD; ULTRASOUND RIGHT BREAST LIMITED  IMPRESSION: 1. Irregular hypoechoic mass in the RIGHT breast at the 10 o'clock axis, 7 cm from the nipple, measuring 9 mm, with associated architectural distortion, corresponding to the mammographic finding. This is a highly suspicious finding for which ultrasound-guided core biopsy is recommended. 2. Additional benign findings within the RIGHT breast, as detailed above. 3. No enlarged or morphologically abnormal lymph nodes in the RIGHT axilla.   12/06/2021 Cancer Staging   Staging form: Breast, AJCC 8th Edition - Clinical stage from  12/06/2021: Stage IA  (cT1b, cN0, cM0, G2, ER+, PR+, HER2-) - Signed by Malachy Mood, MD on 12/11/2021 Stage prefix: Initial diagnosis Histologic grading system: 3 grade system   12/06/2021 Initial Biopsy   Diagnosis Breast, right, needle core biopsy, 10 o'clock, 7cmfn INVASIVE DUCTAL CARCINOMA WITH LOBULAR FEATURES, GRADE 2 (3+2+1)  An E-cadherin immunohistochemical stains performed with adequate control. This stain is diffusely negative within the tumor. Histologically the tumor forms ducts and is present in cribriform sheets as well as focally present in cords and small solid nests. Given this histology it is felt the negative E-cadherin stain represents aberrant absence of expression within an otherwise ductal carcinoma. This lack of E-cadherin expression may explain the focal lobular features.  PROGNOSTIC INDICATORS Results: The tumor cells are EQUIVOCAL for Her2 (2+). Her2 by FISH will be performed and the results reported separately. Estrogen Receptor: 95%, POSITIVE, STRONG STAINING INTENSITY Progesterone Receptor: 80%, POSITIVE, STRONG STAINING INTENSITY Proliferation Marker Ki67: 2%   12/10/2021 Initial Diagnosis   Malignant neoplasm of upper-outer quadrant of right breast in female, estrogen receptor positive (HCC)    Genetic Testing   [facility] CancerNext-Expanded Panel was Negative. Of note, a variant of uncertain significance was detected in the BRCA2 gene (p.P606L). Report date is 12/24/2021.  The CancerNext-Expanded gene panel offered by [facility] and includes sequencing, rearrangement, and RNA analysis for the following 77 genes: AIP, ALK, APC, ATM, AXIN2, BAP1, BARD1, BLM, BMPR1A, BRCA1, BRCA2, BRIP1, CDC73, CDH1, CDK4, CDKN1B, CDKN2A, CHEK2, CTNNA1, DICER1, FANCC, FH, FLCN, GALNT12, KIF1B, LZTR1, MAX, MEN1, MET, MLH1, MSH2, MSH3, MSH6, MUTYH, NBN, NF1, NF2, NTHL1, PALB2, PHOX2B, PMS2, POT1, PRKAR1A, PTCH1, PTEN, RAD51C, RAD51D, RB1, RECQL, RET, SDHA, SDHAF2, SDHB, SDHC, SDHD, SMAD4, SMARCA4, SMARCB1,  SMARCE1, STK11, SUFU, TMEM127, TP53, TSC1, TSC2, VHL and XRCC2 (sequencing and deletion/duplication); EGFR, EGLN1, HOXB13, KIT, MITF, PDGFRA, POLD1, and POLE (sequencing only); EPCAM and GREM1 (deletion/duplication only).    01/31/2022 Cancer Staging   Staging form: Breast, AJCC 8th Edition - Pathologic stage from 01/31/2022: Stage Unknown (pT1c, pNX, cM0, G2, ER+, PR+, HER2-) - Signed by Malachy Mood, MD on 02/12/2022 Stage prefix: Initial diagnosis Histologic grading system: 3 grade system Residual tumor (R): R0 - None   01/31/2022 Definitive Surgery   FINAL MICROSCOPIC DIAGNOSIS:   A. BREAST, RIGHT, LUMPECTOMY:  -  Invasive ductal carcinoma with lobular features, Nottingham grade 2 of 3, 1.5 cm  -  Ductal carcinoma in-situ, intermediate grade  -  Margins uninvolved by carcinoma (<0.1 cm; anterior margin)  -  Previous biopsy site changes present  -  Adenosis and fibrocystic changes  -  See oncology table and comment below   B. BREAST, RIGHT ADDITIONAL ANTERIOMEDIAL MARGIN, EXCISION:  -  Usual ductal hyperplasia  -  No residual carcinoma identified  -  See comment   C. BREAST, RIGHT ADDITIONAL ANTERIOPOSTERIOR MARGIN, EXCISION:  -  No residual carcinoma identified   D. BREAST, RIGHT ADDITIONAL LATEROINFERIOR MARGIN, EXCISION:  -  Focal residual ductal carcinoma in situ  -  Fibrocystic changes with metaplasia  -  Margins uninvolved by carcinoma (0.1 cm; inferior margin)  -  See comment    03/2022 -  Anti-estrogen oral therapy   Tamoxifen x 5 years      Discussed the use of AI scribe software for clinical note transcription with the patient, who gave verbal consent to proceed.  History of Present Illness   The patient, an 82 year old female with a history of early stage breast cancer, presents for a routine  follow-up. She has been on tamoxifen for her breast cancer, but has been experiencing joint pain, which she initially attributed to the medication. After discussing with her  primary care physician, she was convinced that the joint pain was not due to tamoxifen and resumed the full dose. However, she still felt that the medication was causing her joint pain and decided to reduce the dose to half. She is unsure if the half dose is beneficial, but reports no side effects from it. She has tried three different types of anti-estrogen medications, including anastrozole and possibly exemestane, but found tamoxifen to be the most tolerable.  In addition to her breast cancer, the patient also has a history of atrial fibrillation (AFib). She was initially on a half dose of metoprolol due to feeling lethargic and having a low pulse, but her AFib symptoms worsened. She has since returned to the full dose of 25mg  of metoprolol, which seems to be managing her AFib symptoms better.  The patient also reports occasional shooting pain at the site of her lumpectomy, but it is not severe. She is due for a mammogram at the end of January.         All other systems were reviewed with the patient and are negative.  MEDICAL HISTORY:  Past Medical History:  Diagnosis Date   Anxiety    Arthritis    Cancer (HCC) 2023   right breast   Cataract    CHF (congestive heart failure) (HCC)    Dysrhythmia    A fib   Heart murmur    HOH (hard of hearing)    Hyperlipidemia    Hypertension    PAF (paroxysmal atrial fibrillation) (HCC)    a. dx on 06/2015 admission. spontaneously converted into NSR. placed on Eliquis   Sleep apnea    CPAP    SURGICAL HISTORY: Past Surgical History:  Procedure Laterality Date   ABDOMINAL HYSTERECTOMY     ATRIAL FIBRILLATION ABLATION N/A 09/10/2022   Procedure: ATRIAL FIBRILLATION ABLATION;  Surgeon: Regan Lemming, MD;  Location: [facility] INVASIVE CV LAB;  Service: Cardiovascular;  Laterality: N/A;   BREAST BIOPSY Right 12/06/2021   BREAST CYST EXCISION Left    BREAST LUMPECTOMY Right 01/31/2022   BREAST LUMPECTOMY WITH RADIOACTIVE SEED LOCALIZATION  Right 01/31/2022   Procedure: RIGHT BREAST LUMPECTOMY WITH RADIOACTIVE SEED LOCALIZATION;  Surgeon: Harriette Bouillon, MD;  Location: [facility] OR;  Service: General;  Laterality: Right;   BREAST SURGERY Left    lumpectomy 35 yrs ago   CARDIOVERSION N/A 12/31/2021   Procedure: CARDIOVERSION;  Surgeon: Little Ishikawa, MD;  Location[facility];  Service: Cardiovascular;  Laterality: N/A;   DILATION AND CURETTAGE OF UTERUS     NECK SURGERY     discectomy with cadaver bone   TEE WITHOUT CARDIOVERSION N/A 12/31/2021   Procedure: TRANSESOPHAGEAL ECHOCARDIOGRAM (TEE);  Surgeon: Little Ishikawa, MD;  Location: [facility] ENDOSCOPY;  Service: Cardiovascular;  Laterality: N/A;   TONSILLECTOMY     removed as a child   TUBAL LIGATION      I have reviewed the social history and family history with the patient and they are unchanged from previous note.  ALLERGIES:  is allergic to amlodipine besylate, buspirone, fluoxetine, morphine and codeine, penicillins, and sertraline hcl.  MEDICATIONS:  Current Outpatient Medications  Medication Sig Dispense Refill   acetaminophen (TYLENOL) 500 MG tablet Take 500 mg by mouth as needed for mild pain.     ALPRAZolam (XANAX) 0.25 MG tablet Take 1 tablet (  0.25 mg total) by mouth at bedtime as needed for anxiety. 30 tablet 2   amLODipine (NORVASC) 5 MG tablet Take 1 tablet (5 mg total) by mouth daily. 90 tablet 1   apixaban (ELIQUIS) 5 MG TABS tablet Take 1 tablet (5 mg total) by mouth 2 (two) times daily. 180 tablet 1   Cholecalciferol (VITAMIN D) 50 MCG (2000 UT) CAPS Take 2,000 Units by mouth daily.     diltiazem (CARDIZEM) 30 MG tablet Take 1 tablet every 4 hours AS NEEDED for AFIB heart rate >100 45 tablet 3   furosemide (LASIX) 20 MG tablet Take 1 tablet (20 mg total) by mouth daily. 90 tablet 3   Lactobacillus (PROBIOTIC ACIDOPHILUS PO) Take 1 tablet by mouth daily.     metoprolol succinate (TOPROL-XL) 25 MG 24 hr tablet Take 0.5 tablets (12.5 mg total) by  mouth daily. with or immediately following a meal. 45 tablet 2   potassium chloride (KLOR-CON) 10 MEQ tablet Take 1 tablet (10 mEq total) by mouth 2 (two) times daily. 180 tablet 1   Propylene Glycol 0.6 % SOLN Place 1 drop into both eyes at bedtime. systane eye drops     rosuvastatin (CRESTOR) 10 MG tablet Take 1 tablet (10 mg total) by mouth at bedtime. 90 tablet 1   sacubitril-valsartan (ENTRESTO) 49-51 MG Take 1 tablet by mouth 2 (two) times daily. 180 tablet 3   tamoxifen (NOLVADEX) 20 MG tablet Take 1 tablet (20 mg total) by mouth daily. (Patient not taking: Reported on 06/20/2023) 90 tablet 0   No current facility-administered medications for this visit.    PHYSICAL EXAMINATION: ECOG PERFORMANCE STATUS: 0 - Asymptomatic  Vitals:   08/27/23 1125  BP: (!) 153/77  Pulse: 70  Resp: 18  Temp: 98.3 F (36.8 C)  SpO2: 98%   Wt Readings from Last 3 Encounters:  08/27/23 212 lb 9.6 oz (96.4 kg)  07/28/23 212 lb 2 oz (96.2 kg)  06/20/23 211 lb (95.7 kg)     GENERAL:alert, no distress and comfortable SKIN: skin color, texture, turgor are normal, no rashes or significant lesions EYES: normal, Conjunctiva are pink and non-injected, sclera clear NECK: supple, thyroid normal size, non-tender, without nodularity LYMPH:  no palpable lymphadenopathy in the cervical, axillary  LUNGS: clear to auscultation and percussion with normal breathing effort HEART: regular rate & rhythm and no murmurs and no lower extremity edema ABDOMEN:abdomen soft, non-tender and normal bowel sounds Musculoskeletal:no cyanosis of digits and no clubbing  NEURO: alert & oriented x 3 with fluent speech, no focal motor/sensory deficits BREAST: Right breast exhibits scar tissue from previous lumpectomy, tender upon palpation. No lumps palpated in the left breast. Multiple moles noted on the skin.      LABORATORY DATA:  I have reviewed the data as listed    Latest Ref Rng & Units 07/28/2023   10:12 AM 02/14/2023     9:12 AM 08/16/2022    1:39 PM  CBC  WBC 4.0 - 10.5 K/uL 6.2  5.3  6.8   Hemoglobin 12.0 - 15.0 g/dL 32.9  51.8  84.1   Hematocrit 36.0 - 46.0 % 42.2  40.7  40.9   Platelets 150.0 - 400.0 K/uL 234.0  208  215         Latest Ref Rng & Units 07/28/2023   10:12 AM 02/14/2023    9:12 AM 10/23/2022   11:45 AM  CMP  Glucose 70 - 99 mg/dL 660  630  160   BUN 6 -  23 mg/dL 19  17  19    Creatinine 0.40 - 1.20 mg/dL 1.61  0.96  0.45   Sodium 135 - 145 mEq/L 142  141  143   Potassium 3.5 - 5.1 mEq/L 3.8  4.0  4.6   Chloride 96 - 112 mEq/L 105  108  106   CO2 19 - 32 mEq/L 28  27  28    Calcium 8.4 - 10.5 mg/dL 9.6  9.3  9.5   Total Protein 6.5 - 8.1 g/dL  6.3    Total Bilirubin 0.3 - 1.2 mg/dL  0.9    Alkaline Phos 38 - 126 U/L  100    AST 15 - 41 U/L  19    ALT 0 - 44 U/L  13        RADIOGRAPHIC STUDIES: I have personally reviewed the radiological images as listed and agreed with the findings in the report. No results found.    Orders Placed This Encounter  Procedures   MM 3D DIAGNOSTIC MAMMOGRAM BILATERAL BREAST    [employer] [facility];   11/08/2022@[facility] / no needs/ no issues/ yes breast cancer/ no reductions/ no implants EPIC Order    Standing Status:   Future    Standing Expiration Date:   08/26/2024    Order Specific Question:   Reason for Exam (SYMPTOM  OR DIAGNOSIS REQUIRED)    Answer:   SCREENING    Order Specific Question:   Preferred imaging location?    Answer:   [facility]   All questions were answered. The patient knows to call the clinic with any problems, questions or concerns. No barriers to learning was detected. The total time spent in the appointment was 25 minutes.     Malachy Mood, MD 08/27/2023

## 2023-08-27 NOTE — Assessment & Plan Note (Addendum)
invasive ductal carcinoma with lobular features, stage IA, pT1c, cN0, ER+/PR+/HER2-, Grade 2  -found on screening mammogram. S/p right lumpectomy on 01/31/22 with Dr. Luisa Hart, path showed 1.5 cm IDC with lobular features and DCIS, negative margins. -given her early stage disease and advanced age, we opted to forego adjuvant radiation.  -she started tamoxifen in 02/2022, discontinued due to increased anxiety. Switched to anastrozole 06/2022, did not tolerate well and switched back to tamoxifen at reduced dose 10 mg daily which she tolerats well.

## 2023-08-29 ENCOUNTER — Ambulatory Visit (INDEPENDENT_AMBULATORY_CARE_PROVIDER_SITE_OTHER): Payer: Medicare PPO | Admitting: *Deleted

## 2023-08-29 DIAGNOSIS — Z0000 Encounter for general adult medical examination without abnormal findings: Secondary | ICD-10-CM | POA: Diagnosis not present

## 2023-08-29 NOTE — Patient Instructions (Signed)
Shannon Liu , Thank you for taking time to come for your Medicare Wellness Visit. I appreciate your ongoing commitment to your health goals. Please review the following plan we discussed and let me know if I can assist you in the future.     This is a list of the screening recommended for you and due dates:  Health Maintenance  Topic Date Due   COVID-19 Vaccine (5 - 2023-24 season) 06/15/2023   Medicare Annual Wellness Visit  08/28/2024   DTaP/Tdap/Td vaccine (3 - Td or Tdap) 11/08/2024   Pneumonia Vaccine  Completed   Flu Shot  Completed   DEXA scan (bone density measurement)  Completed   Zoster (Shingles) Vaccine  Completed   HPV Vaccine  Aged Out    Next appointment: Follow up in one year for your annual wellness visit.   Preventive Care 24 Years and Older, Female Preventive care refers to lifestyle choices and visits with your health care provider that can promote health and wellness. What does preventive care include? A yearly physical exam. This is also called an annual well check. Dental exams once or twice a year. Routine eye exams. Ask your health care provider how often you should have your eyes checked. Personal lifestyle choices, including: Daily care of your teeth and gums. Regular physical activity. Eating a healthy diet. Avoiding tobacco and drug use. Limiting alcohol use. Practicing safe sex. Taking low-dose aspirin every day. Taking vitamin and mineral supplements as recommended by your health care provider. What happens during an annual well check? The services and screenings done by your health care provider during your annual well check will depend on your age, overall health, lifestyle risk factors, and family history of disease. Counseling  Your health care provider may ask you questions about your: Alcohol use. Tobacco use. Drug use. Emotional well-being. Home and relationship well-being. Sexual activity. Eating habits. History of falls. Memory  and ability to understand (cognition). Work and work Astronomer. Reproductive health. Screening  You may have the following tests or measurements: Height, weight, and BMI. Blood pressure. Lipid and cholesterol levels. These may be checked every 5 years, or more frequently if you are over 60 years old. Skin check. Lung cancer screening. You may have this screening every year starting at age 82 if you have a 30-pack-year history of smoking and currently smoke or have quit within the past 15 years. Fecal occult blood test (FOBT) of the stool. You may have this test every year starting at age 82. Flexible sigmoidoscopy or colonoscopy. You may have a sigmoidoscopy every 5 years or a colonoscopy every 10 years starting at age 82. Hepatitis C blood test. Hepatitis B blood test. Sexually transmitted disease (STD) testing. Diabetes screening. This is done by checking your blood sugar (glucose) after you have not eaten for a while (fasting). You may have this done every 1-3 years. Bone density scan. This is done to screen for osteoporosis. You may have this done starting at age 82. Mammogram. This may be done every 1-2 years. Talk to your health care provider about how often you should have regular mammograms. Talk with your health care provider about your test results, treatment options, and if necessary, the need for more tests. Vaccines  Your health care provider may recommend certain vaccines, such as: Influenza vaccine. This is recommended every year. Tetanus, diphtheria, and acellular pertussis (Tdap, Td) vaccine. You may need a Td booster every 10 years. Zoster vaccine. You may need this after age 36. Pneumococcal  13-valent conjugate (PCV13) vaccine. One dose is recommended after age 82. Pneumococcal polysaccharide (PPSV23) vaccine. One dose is recommended after age 82. Talk to your health care provider about which screenings and vaccines you need and how often you need them. This  information is not intended to replace advice given to you by your health care provider. Make sure you discuss any questions you have with your health care provider. Document Released: 10/27/2015 Document Revised: 06/19/2016 Document Reviewed: 08/01/2015 Elsevier Interactive Patient Education  2017 [employer].  Fall Prevention in the Home Falls can cause injuries. They can happen to people of all ages. There are many things you can do to make your home safe and to help prevent falls. What can I do on the outside of my home? Regularly fix the edges of walkways and driveways and fix any cracks. Remove anything that might make you trip as you walk through a door, such as a raised step or threshold. Trim any bushes or trees on the path to your home. Use bright outdoor lighting. Clear any walking paths of anything that might make someone trip, such as rocks or tools. Regularly check to see if handrails are loose or broken. Make sure that both sides of any steps have handrails. Any raised decks and porches should have guardrails on the edges. Have any leaves, snow, or ice cleared regularly. Use sand or salt on walking paths during winter. Clean up any spills in your garage right away. This includes oil or grease spills. What can I do in the bathroom? Use night lights. Install grab bars by the toilet and in the tub and shower. Do not use towel bars as grab bars. Use non-skid mats or decals in the tub or shower. If you need to sit down in the shower, use a plastic, non-slip stool. Keep the floor dry. Clean up any water that spills on the floor as soon as it happens. Remove soap buildup in the tub or shower regularly. Attach bath mats securely with double-sided non-slip rug tape. Do not have throw rugs and other things on the floor that can make you trip. What can I do in the bedroom? Use night lights. Make sure that you have a light by your bed that is easy to reach. Do not use any sheets or  blankets that are too big for your bed. They should not hang down onto the floor. Have a firm chair that has side arms. You can use this for support while you get dressed. Do not have throw rugs and other things on the floor that can make you trip. What can I do in the kitchen? Clean up any spills right away. Avoid walking on wet floors. Keep items that you use a lot in easy-to-reach places. If you need to reach something above you, use a strong step stool that has a grab bar. Keep electrical cords out of the way. Do not use floor polish or wax that makes floors slippery. If you must use wax, use non-skid floor wax. Do not have throw rugs and other things on the floor that can make you trip. What can I do with my stairs? Do not leave any items on the stairs. Make sure that there are handrails on both sides of the stairs and use them. Fix handrails that are broken or loose. Make sure that handrails are as long as the stairways. Check any carpeting to make sure that it is firmly attached to the stairs. Fix any carpet  that is loose or worn. Avoid having throw rugs at the top or bottom of the stairs. If you do have throw rugs, attach them to the floor with carpet tape. Make sure that you have a light switch at the top of the stairs and the bottom of the stairs. If you do not have them, ask someone to add them for you. What else can I do to help prevent falls? Wear shoes that: Do not have high heels. Have rubber bottoms. Are comfortable and fit you well. Are closed at the toe. Do not wear sandals. If you use a stepladder: Make sure that it is fully opened. Do not climb a closed stepladder. Make sure that both sides of the stepladder are locked into place. Ask someone to hold it for you, if possible. Clearly mark and make sure that you can see: Any grab bars or handrails. First and last steps. Where the edge of each step is. Use tools that help you move around (mobility aids) if they are  needed. These include: Canes. Walkers. Scooters. Crutches. Turn on the lights when you go into a dark area. Replace any light bulbs as soon as they burn out. Set up your furniture so you have a clear path. Avoid moving your furniture around. If any of your floors are uneven, fix them. If there are any pets around you, be aware of where they are. Review your medicines with your doctor. Some medicines can make you feel dizzy. This can increase your chance of falling. Ask your doctor what other things that you can do to help prevent falls. This information is not intended to replace advice given to you by your health care provider. Make sure you discuss any questions you have with your health care provider. Document Released: 07/27/2009 Document Revised: 03/07/2016 Document Reviewed: 11/04/2014 Elsevier Interactive Patient Education  2017 [employer].

## 2023-08-29 NOTE — Progress Notes (Signed)
Subjective:   Shannon Liu is a 82 y.o. female who presents for Medicare Annual (Subsequent) preventive examination.  Visit Complete: Virtual I connected with  Shannon Liu on 08/27/23 by a audio enabled telemedicine application and verified that I am speaking with the correct person using two identifiers.  Patient Location: Home  Provider Location: Office/Clinic  I discussed the limitations of evaluation and management by telemedicine. The patient expressed understanding and agreed to proceed.  Vital Signs: Because this visit was a virtual/telehealth visit, some criteria may be missing or patient reported. Any vitals not documented were not able to be obtained and vitals that have been documented are patient reported.  Patient Medicare AWV questionnaire was completed by the patient on 08/27/23; I have confirmed that all information answered by patient is correct and no changes since this date.  Cardiac Risk Factors include: advanced age (>55men, >3 women);dyslipidemia;hypertension;obesity (BMI >30kg/m2)     Objective:    Today's Vitals   08/27/23 2109  PainSc: 3    There is no height or weight on file to calculate BMI.     08/29/2023   11:12 AM 02/14/2023    9:45 AM 12/28/2022    9:41 AM 09/10/2022    8:45 AM 08/15/2022   11:51 AM 03/26/2022   10:53 AM 01/31/2022    7:42 AM  Advanced Directives  Does Patient Have a Medical Advance Directive? Yes Yes No Yes Yes No Yes  Type of Estate agent of [place];Living will Healthcare Power of [place];Living will  Healthcare Power of [place];Living will Healthcare Power of [place];Living will  Healthcare Power of [place];Living will  Does patient want to make changes to medical advance directive? No - Patient declined    No - Patient declined    Copy of Healthcare Power of Attorney in Chart? No - copy requested    Yes - validated most recent copy scanned in chart (See row information)  No - copy requested   Would patient like information on creating a medical advance directive?  No - Patient declined         Current Medications (verified) Outpatient Encounter Medications as of 08/29/2023  Medication Sig   tamoxifen (NOLVADEX) 20 MG tablet Take 10 mg by mouth daily.   acetaminophen (TYLENOL) 500 MG tablet Take 500 mg by mouth as needed for mild pain.   ALPRAZolam (XANAX) 0.25 MG tablet Take 1 tablet (0.25 mg total) by mouth at bedtime as needed for anxiety.   amLODipine (NORVASC) 5 MG tablet Take 1 tablet (5 mg total) by mouth daily.   apixaban (ELIQUIS) 5 MG TABS tablet Take 1 tablet (5 mg total) by mouth 2 (two) times daily.   Cholecalciferol (VITAMIN D) 50 MCG (2000 UT) CAPS Take 2,000 Units by mouth daily.   diltiazem (CARDIZEM) 30 MG tablet Take 1 tablet every 4 hours AS NEEDED for AFIB heart rate >100   furosemide (LASIX) 20 MG tablet Take 1 tablet (20 mg total) by mouth daily.   Lactobacillus (PROBIOTIC ACIDOPHILUS PO) Take 1 tablet by mouth daily.   metoprolol succinate (TOPROL-XL) 25 MG 24 hr tablet Take 0.5 tablets (12.5 mg total) by mouth daily. with or immediately following a meal.   potassium chloride (KLOR-CON) 10 MEQ tablet Take 1 tablet (10 mEq total) by mouth 2 (two) times daily.   Propylene Glycol 0.6 % SOLN Place 1 drop into both eyes at bedtime. systane eye drops   rosuvastatin (CRESTOR) 10 MG tablet Take 1 tablet (10  mg total) by mouth at bedtime.   sacubitril-valsartan (ENTRESTO) 49-51 MG Take 1 tablet by mouth 2 (two) times daily.   [DISCONTINUED] tamoxifen (NOLVADEX) 20 MG tablet Take 1 tablet (20 mg total) by mouth daily. (Patient not taking: Reported on 06/20/2023)   No facility-administered encounter medications on file as of 08/29/2023.    Allergies (verified) Amlodipine besylate, Buspirone, Fluoxetine, Morphine and codeine, Penicillins, and Sertraline hcl   History: Past Medical History:  Diagnosis Date   Anxiety    Arthritis    Cancer (HCC) 2023   right  breast   Cataract    CHF (congestive heart failure) (HCC)    Dysrhythmia    A fib   Heart murmur    HOH (hard of hearing)    Hyperlipidemia    Hypertension    PAF (paroxysmal atrial fibrillation) (HCC)    a. dx on 06/2015 admission. spontaneously converted into NSR. placed on Eliquis   Sleep apnea    CPAP   Past Surgical History:  Procedure Laterality Date   ABDOMINAL HYSTERECTOMY     ATRIAL FIBRILLATION ABLATION N/A 09/10/2022   Procedure: ATRIAL FIBRILLATION ABLATION;  Surgeon: Shannon Lemming, MD;  Location: [facility] INVASIVE CV LAB;  Service: Cardiovascular;  Laterality: N/A;   BREAST BIOPSY Right 12/06/2021   BREAST CYST EXCISION Left    BREAST LUMPECTOMY Right 01/31/2022   BREAST LUMPECTOMY WITH RADIOACTIVE SEED LOCALIZATION Right 01/31/2022   Procedure: RIGHT BREAST LUMPECTOMY WITH RADIOACTIVE SEED LOCALIZATION;  Surgeon: Shannon Bouillon, MD;  Location: [facility] OR;  Service: General;  Laterality: Right;   BREAST SURGERY Left    lumpectomy 35 yrs ago   CARDIOVERSION N/A 12/31/2021   Procedure: CARDIOVERSION;  Surgeon: Shannon Ishikawa, MD;  Location: [facility] ENDOSCOPY;  Service: Cardiovascular;  Laterality: N/A;   DILATION AND CURETTAGE OF UTERUS     NECK SURGERY     discectomy with cadaver bone   SPINE SURGERY  Years ago   Neck surgery   TEE WITHOUT CARDIOVERSION N/A 12/31/2021   Procedure: TRANSESOPHAGEAL ECHOCARDIOGRAM (TEE);  Surgeon: Shannon Ishikawa, MD;  Location: [facility] ENDOSCOPY;  Service: Cardiovascular;  Laterality: N/A;   TONSILLECTOMY     removed as a child   TUBAL LIGATION     Family History  Problem Relation Age of Onset   Heart attack Mother 76   CVA Mother 67   Unexplained death Father 2   Hypertension Sister    Hypertension Brother    Colon cancer Maternal Grandfather    Prostate cancer Son 44   Breast cancer Neg Hx    Social History   Socioeconomic History   Marital status: Widowed    Spouse name: Not on file   Number of children: 2    Years of education: Not on file   Highest education level: 12th grade  Occupational History   Occupation: Retired- [place] [employer] office  Tobacco Use   Smoking status: Never   Smokeless tobacco: Never  Vaping Use   Vaping status: Never Used  Substance and Sexual Activity   Alcohol use: No    Alcohol/week: 0.0 standard drinks of alcohol   Drug use: No   Sexual activity: Not Currently  Other Topics Concern   Not on file  Social History Narrative   Lives [place], near [place].    Daughter currently lives with her.   Social Determinants of Health   Financial Resource Strain: Low Risk  (08/27/2023)   Overall Financial Resource Strain (CARDIA)  Difficulty of Paying Living Expenses: Not hard at all  Food Insecurity: No Food Insecurity (08/27/2023)   Hunger Vital Sign    Worried About Running Out of Food in the Last Year: Never true    Ran Out of Food in the Last Year: Never true  Transportation Needs: No Transportation Needs (08/27/2023)   PRAPARE - Administrator, Civil Service (Medical): No    Lack of Transportation (Non-Medical): No  Physical Activity: Insufficiently Active (08/27/2023)   Exercise Vital Sign    Days of Exercise per Week: 2 days    Minutes of Exercise per Session: 20 min  Stress: No Stress Concern Present (08/27/2023)   [employer] of Occupational Health - Occupational Stress Questionnaire    Feeling of Stress : Only a Shannon  Recent Concern: Stress - Stress Concern Present (07/25/2023)   [employer] of Occupational Health - Occupational Stress Questionnaire    Feeling of Stress : To some extent  Social Connections: Moderately Integrated (08/27/2023)   Social Connection and Isolation Panel [NHANES]    Frequency of Communication with Friends and Family: More than three times a week    Frequency of Social Gatherings with Friends and Family: Twice a week    Attends Religious Services: More than 4 times per year     Active Member of [employer] or Organizations: Yes    Attends Banker Meetings: More than 4 times per year    Marital Status: Widowed    Tobacco Counseling Counseling given: Not Answered   Clinical Intake:  Pre-visit preparation completed: Yes  Pain : 0-10 Pain Score: 3  Pain Type: Chronic pain Pain Location: Other (Comment) (joint pain) Pain Descriptors / Indicators: Aching Pain Onset: More than a month ago Pain Frequency: Intermittent  BMI - recorded: 34.31 Nutritional Status: BMI > 30  Obese Nutritional Risks: None Diabetes: No  How often do you need to have someone help you when you read instructions, pamphlets, or other written materials from your doctor or pharmacy?: 1 - Never  Interpreter Needed?: No  Information entered by :: Donne Anon, CMA   Activities of Daily Living    08/27/2023    9:09 PM  In your present state of health, do you have any difficulty performing the following activities:  Hearing? 1  Comment wears hearing aids  Vision? 0  Difficulty concentrating or making decisions? 0  Walking or climbing stairs? 0  Dressing or bathing? 0  Doing errands, shopping? 0  Preparing Food and eating ? N  Using the Toilet? N  In the past six months, have you accidently leaked urine? N  Do you have problems with loss of bowel control? N  Managing your Medications? N  Managing your Finances? N  Housekeeping or managing your Housekeeping? N    Patient Care Team: Wanda Plump, MD as PCP - General (Internal Medicine) Jake Bathe, MD as PCP - Cardiology (Cardiology) Shannon Lemming, MD as PCP - Electrophysiology (Cardiology) Shannon Bouillon, MD as Consulting Physician (General Surgery) Malachy Mood, MD as Consulting Physician (Hematology) Dorothy Puffer, MD as Consulting Physician (Radiation Oncology) Kerin Salen, MD as Consulting Physician (Gastroenterology)  Indicate any recent Medical Services you may have received from other than [facility]  providers in the past year (date may be approximate).     Assessment:   This is a routine wellness examination for Denissa.  Hearing/Vision screen No results found.   Goals Addressed   None    Depression Screen  08/29/2023   11:16 AM 07/28/2023    9:51 AM 03/19/2023   11:07 AM 10/23/2022   11:12 AM 08/15/2022   11:49 AM 07/16/2022    2:09 PM  PHQ 2/9 Scores  PHQ - 2 Score 0 0 0 0 0 0    Fall Risk    08/27/2023    9:09 PM 07/28/2023    9:51 AM 03/19/2023   11:07 AM 10/23/2022   11:11 AM 08/15/2022   11:52 AM  Fall Risk   Falls in the past year? 0 0 0 1 1  Number falls in past yr: 0 0 0 0 1  Injury with Fall? 0 0 0 0 1  Comment     bruised legs  Risk for fall due to : No Fall Risks    Impaired vision;Impaired balance/gait  Follow up Falls evaluation completed Falls evaluation completed Falls evaluation completed Falls evaluation completed Falls prevention discussed    MEDICARE RISK AT HOME: Medicare Risk at Home Any stairs in or around the home?: Yes If so, are there any without handrails?: Yes Home free of loose throw rugs in walkways, pet beds, electrical cords, etc?: Yes Adequate lighting in your home to reduce risk of falls?: Yes Life alert?: No Use of a cane, walker or w/c?: No Grab bars in the bathroom?: Yes Shower chair or bench in shower?: Yes Elevated toilet seat or a handicapped toilet?: Yes  TIMED UP AND GO:  Was the test performed?  No    Cognitive Function:        08/29/2023   11:20 AM 08/15/2022   11:54 AM  6CIT Screen  What Year? 0 points 0 points  What month? 0 points 0 points  What time? 0 points 0 points  Count back from 20 0 points 0 points  Months in reverse 0 points 0 points  Repeat phrase 0 points 6 points  Total Score 0 points 6 points    Immunizations Immunization History  Administered Date(s) Administered   Influenza, High Dose Seasonal PF 06/23/2020, 06/28/2021, 06/24/2023   Influenza,inj,Quad PF,6+ Mos 06/24/2015    Influenza-Unspecified 07/04/2022   PFIZER(Purple Top)SARS-COV-2 Vaccination 10/26/2019, 11/14/2019, 07/15/2020   Pfizer Covid-19 Vaccine Bivalent Booster 21yrs & up 07/23/2021   Pneumococcal Conjugate-13 05/06/2014   Pneumococcal Polysaccharide-23 12/05/2006, 07/02/2011   Td 11/14/2004   Tdap 11/08/2014   Zoster Recombinant(Shingrix) 11/16/2018, 06/19/2019   Zoster, Live 05/01/2010    TDAP status: Up to date  Flu Vaccine status: Up to date  Pneumococcal vaccine status: Up to date  Covid-19 vaccine status: Information provided on how to obtain vaccines.   Qualifies for Shingles Vaccine? Yes   Zostavax completed Yes   Shingrix Completed?: Yes  Screening Tests Health Maintenance  Topic Date Due   COVID-19 Vaccine (5 - 2023-24 season) 06/15/2023   Medicare Annual Wellness (AWV)  08/16/2023   DTaP/Tdap/Td (3 - Td or Tdap) 11/08/2024   Pneumonia Vaccine 29+ Years old  Completed   INFLUENZA VACCINE  Completed   DEXA SCAN  Completed   Zoster Vaccines- Shingrix  Completed   HPV VACCINES  Aged Out    Health Maintenance  Health Maintenance Due  Topic Date Due   COVID-19 Vaccine (5 - 2023-24 season) 06/15/2023   Medicare Annual Wellness (AWV)  08/16/2023    Colorectal cancer screening: No longer required.   Mammogram status: Ordered 08/27/23. Pt provided with contact info and advised to call to schedule appt.   Bone Density status: Completed 05/30/22. Results reflect: Bone density  results: NORMAL. Repeat every 2 years.  Lung Cancer Screening: (Low Dose CT Chest recommended if Age 33-80 years, 20 pack-year currently smoking OR have quit w/in 15years.) does not qualify.   Additional Screening:  Hepatitis C Screening: does not qualify  Vision Screening: Recommended annual ophthalmology exams for early detection of glaucoma and other disorders of the eye. Is the patient up to date with their annual eye exam?  Yes  Who is the provider or what is the name of the office in which  the patient attends annual eye exams? Dr. Charlotte San[facility] If pt is not established with a provider, would they like to be referred to a provider to establish care? No .   Dental Screening: Recommended annual dental exams for proper oral hygiene  Diabetic Foot Exam: N/a  Community Resource Referral / Chronic Care Management: CRR required this visit?  No   CCM required this visit?  No     Plan:     I have personally reviewed and noted the following in the patient's chart:   Medical and social history Use of alcohol, tobacco or illicit drugs  Current medications and supplements including opioid prescriptions. Patient is not currently taking opioid prescriptions. Functional ability and status Nutritional status Physical activity Advanced directives List of other physicians Hospitalizations, surgeries, and ER visits in previous 12 months Vitals Screenings to include cognitive, depression, and falls Referrals and appointments  In addition, I have reviewed and discussed with patient certain preventive protocols, quality metrics, and best practice recommendations. A written personalized care plan for preventive services as well as general preventive health recommendations were provided to patient.     Donne Anon, CMA   08/29/2023   After Visit Summary: (MyChart) Due to this being a telephonic visit, the after visit summary with patients personalized plan was offered to patient via MyChart   Nurse Notes: None

## 2023-09-02 ENCOUNTER — Other Ambulatory Visit (HOSPITAL_COMMUNITY): Payer: Self-pay

## 2023-09-03 ENCOUNTER — Other Ambulatory Visit (HOSPITAL_COMMUNITY): Payer: Self-pay

## 2023-09-03 ENCOUNTER — Other Ambulatory Visit: Payer: Self-pay | Admitting: Internal Medicine

## 2023-09-03 ENCOUNTER — Other Ambulatory Visit: Payer: Self-pay

## 2023-09-03 ENCOUNTER — Encounter: Payer: Self-pay | Admitting: Hematology

## 2023-09-03 MED ORDER — ROSUVASTATIN CALCIUM 10 MG PO TABS
10.0000 mg | ORAL_TABLET | Freq: Every day | ORAL | 1 refills | Status: DC
  Filled 2023-09-03: qty 90, 90d supply, fill #0
  Filled 2023-12-04: qty 90, 90d supply, fill #1

## 2023-09-03 MED ORDER — AMLODIPINE BESYLATE 5 MG PO TABS
5.0000 mg | ORAL_TABLET | Freq: Every day | ORAL | 1 refills | Status: DC
  Filled 2023-09-03: qty 90, 90d supply, fill #0
  Filled 2023-12-04: qty 90, 90d supply, fill #1

## 2023-09-03 MED ORDER — TAMOXIFEN CITRATE 10 MG PO TABS
10.0000 mg | ORAL_TABLET | Freq: Two times a day (BID) | ORAL | 1 refills | Status: DC
  Filled 2023-09-03: qty 30, 15d supply, fill #0

## 2023-09-03 MED FILL — Apixaban Tab 5 MG: ORAL | 30 days supply | Qty: 60 | Fill #1 | Status: CN

## 2023-09-03 MED FILL — Alprazolam Tab 0.25 MG: ORAL | 30 days supply | Qty: 30 | Fill #1 | Status: AC

## 2023-09-03 NOTE — Progress Notes (Signed)
Verbal order w/readback from Dr. Mosetta Putt for Tamoxifen 10mg  PO daily.  Order placed and sent to [facility].

## 2023-09-04 ENCOUNTER — Other Ambulatory Visit: Payer: Self-pay | Admitting: Cardiology

## 2023-09-04 ENCOUNTER — Encounter (HOSPITAL_COMMUNITY): Payer: Self-pay

## 2023-09-04 ENCOUNTER — Other Ambulatory Visit: Payer: Self-pay | Admitting: Nurse Practitioner

## 2023-09-04 ENCOUNTER — Other Ambulatory Visit: Payer: Self-pay

## 2023-09-04 ENCOUNTER — Other Ambulatory Visit (HOSPITAL_COMMUNITY): Payer: Self-pay

## 2023-09-04 MED ORDER — TAMOXIFEN CITRATE 10 MG PO TABS
10.0000 mg | ORAL_TABLET | Freq: Two times a day (BID) | ORAL | 1 refills | Status: DC
  Filled 2023-09-04 – 2023-09-16 (×2): qty 60, 30d supply, fill #0
  Filled 2023-11-03: qty 60, 30d supply, fill #1

## 2023-09-05 ENCOUNTER — Other Ambulatory Visit: Payer: Self-pay

## 2023-09-05 ENCOUNTER — Other Ambulatory Visit (HOSPITAL_COMMUNITY): Payer: Self-pay

## 2023-09-05 MED ORDER — APIXABAN 5 MG PO TABS
5.0000 mg | ORAL_TABLET | Freq: Two times a day (BID) | ORAL | 1 refills | Status: DC
  Filled 2023-09-05: qty 180, 90d supply, fill #0
  Filled 2023-12-04: qty 180, 90d supply, fill #1

## 2023-09-05 NOTE — Telephone Encounter (Signed)
Pt last saw Clint Fenton, PA on 06/20/23, last labs 07/28/23 Creat 0.73, age 82, weight 96.4kg, based on specified criteria pt is on appropriate dosage of Eliquis 5mg  BID for afib.  Will refill rx.

## 2023-09-06 ENCOUNTER — Encounter: Payer: Self-pay | Admitting: Internal Medicine

## 2023-09-08 ENCOUNTER — Other Ambulatory Visit (HOSPITAL_COMMUNITY): Payer: Self-pay

## 2023-09-09 ENCOUNTER — Other Ambulatory Visit (HOSPITAL_COMMUNITY): Payer: Self-pay

## 2023-09-09 ENCOUNTER — Telehealth: Payer: Self-pay | Admitting: *Deleted

## 2023-09-09 DIAGNOSIS — M48062 Spinal stenosis, lumbar region with neurogenic claudication: Secondary | ICD-10-CM | POA: Diagnosis not present

## 2023-09-09 NOTE — Telephone Encounter (Signed)
[facility]/w [facility] and stated needed a date for surgery.  Stated in December but could not give a date.  Gave to [employer].

## 2023-09-09 NOTE — Telephone Encounter (Signed)
   Pre-operative Risk Assessment    Patient Name: Shannon Liu  DOB: 08/12/1941 MRN: [id number]  DATE OF LAST VISIT: 04/23/23 DR. SKAINS DATE OF NEXT VISIT: NONE WITH GEN CARD    Request for Surgical Clearance    Procedure:   ESI  Date of Surgery:  Clearance TBD                                 Surgeon:  DR. Claria Dice Surgeon's Group or Practice Name:  [facility] Phone number:  [phone] Fax number:  [phone]   Type of Clearance Requested:   - Medical  - Pharmacy:  Hold Apixaban (Eliquis)     Type of Anesthesia:  Not Indicated   Additional requests/questions:    Elpidio Anis   09/09/2023, 2:01 PM

## 2023-09-10 NOTE — Telephone Encounter (Signed)
Patient with diagnosis of afib on Eliquis for anticoagulation.    Procedure: ESI Date of procedure: TBD   CHA2DS2-VASc Score = 5   This indicates a 7.2% annual risk of stroke. The patient's score is based upon: CHF History: 1 HTN History: 1 Diabetes History: 0 Stroke History: 0 Vascular Disease History: 0 Age Score: 2 Gender Score: 1      CrCl 69 ml/min Platelet count 234  Per office protocol, patient can hold Eliquis for 3 days prior to procedure.     **This guidance is not considered finalized until pre-operative APP has relayed final recommendations.**

## 2023-09-15 NOTE — Telephone Encounter (Signed)
   Name: Shannon Liu  DOB: 01/30/1941  MRN: [id number]  Primary Cardiologist: Donato Schultz, MD   Preoperative team, please contact this patient and set up a phone call appointment for further preoperative risk assessment. Please obtain consent and complete medication review. Thank you for your help.  I confirm that guidance regarding antiplatelet and oral anticoagulation therapy has been completed and, if necessary, noted below.  Per office protocol, patient can hold Eliquis for 3 days prior to procedure.    I also confirmed the patient resides in the state of [place]. As per [facility] Medical Board telemedicine laws, the patient must reside in the state in which the provider is licensed.   Denyce Robert, NP 09/15/2023, 9:42 AM [facility]

## 2023-09-16 ENCOUNTER — Other Ambulatory Visit (HOSPITAL_COMMUNITY): Payer: Self-pay

## 2023-09-16 ENCOUNTER — Other Ambulatory Visit: Payer: Self-pay

## 2023-09-17 ENCOUNTER — Encounter: Payer: Self-pay | Admitting: Internal Medicine

## 2023-09-17 ENCOUNTER — Telehealth: Payer: Self-pay

## 2023-09-17 ENCOUNTER — Ambulatory Visit: Payer: Medicare PPO | Admitting: Internal Medicine

## 2023-09-17 VITALS — BP 138/86 | HR 83 | Temp 97.6°F | Resp 18 | Ht 66.0 in | Wt 208.2 lb

## 2023-09-17 DIAGNOSIS — J069 Acute upper respiratory infection, unspecified: Secondary | ICD-10-CM

## 2023-09-17 DIAGNOSIS — M958 Other specified acquired deformities of musculoskeletal system: Secondary | ICD-10-CM | POA: Diagnosis not present

## 2023-09-17 NOTE — Progress Notes (Signed)
Subjective:    Patient ID: Shannon Liu, female    DOB: Apr 01, 1941, 82 y.o.   MRN: [id number]  DOS:  09/17/2023 Type of visit - description: Acute  Few days ago, noted to have a "lump" at the right collarbone. Denies any pain.  No injury.  Also, 8 days ago developed a URI: Sore throat, dry cough. Denies fever or chills. She feels good. Symptoms are much improved.  Review of Systems See above   Past Medical History:  Diagnosis Date   Anxiety    Arthritis    Cancer (HCC) 2023   right breast   Cataract    CHF (congestive heart failure) (HCC)    Dysrhythmia    A fib   Heart murmur    HOH (hard of hearing)    Hyperlipidemia    Hypertension    PAF (paroxysmal atrial fibrillation) (HCC)    a. dx on 06/2015 admission. spontaneously converted into NSR. placed on Eliquis   Sleep apnea    CPAP    Past Surgical History:  Procedure Laterality Date   ABDOMINAL HYSTERECTOMY     ATRIAL FIBRILLATION ABLATION N/A 09/10/2022   Procedure: ATRIAL FIBRILLATION ABLATION;  Surgeon: Regan Lemming, MD;  [facility];  Service: Cardiovascular;  Laterality: N/A;   BREAST BIOPSY Right 12/06/2021   BREAST CYST EXCISION Left    BREAST LUMPECTOMY Right 01/31/2022   BREAST LUMPECTOMY WITH RADIOACTIVE SEED LOCALIZATION Right 01/31/2022   Procedure: RIGHT BREAST LUMPECTOMY WITH RADIOACTIVE SEED LOCALIZATION;  Surgeon: Harriette Bouillon, MD;  Location: [facility] OR;  Service: General;  Laterality: Right;   BREAST SURGERY Left    lumpectomy 35 yrs ago   CARDIOVERSION N/A 12/31/2021   Procedure: CARDIOVERSION;  Surgeon: Little Ishikawa, MD;  Location: [facility] ENDOSCOPY;  Service: Cardiovascular;  Laterality: N/A;   DILATION AND CURETTAGE OF UTERUS     NECK SURGERY     discectomy with cadaver bone   SPINE SURGERY  Years ago   Neck surgery   TEE WITHOUT CARDIOVERSION N/A 12/31/2021   Procedure: TRANSESOPHAGEAL ECHOCARDIOGRAM (TEE);  Surgeon: Little Ishikawa, MD;   Location: [facility] ENDOSCOPY;  Service: Cardiovascular;  Laterality: N/A;   TONSILLECTOMY     removed as a child   TUBAL LIGATION      Current Outpatient Medications  Medication Instructions   acetaminophen (TYLENOL) 500 mg, Oral, As needed   ALPRAZolam (XANAX) 0.25 MG tablet Take 1 tablet (0.25 mg total) by mouth at bedtime as needed for anxiety.   amLODipine (NORVASC) 5 mg, Oral, Daily   diltiazem (CARDIZEM) 30 MG tablet Take 1 tablet every 4 hours AS NEEDED for AFIB heart rate >100   Eliquis 5 mg, Oral, 2 times daily   furosemide (LASIX) 20 mg, Oral, Daily   Lactobacillus (PROBIOTIC ACIDOPHILUS PO) 1 tablet, Oral, Daily   metoprolol succinate (TOPROL-XL) 12.5 mg, Oral, Daily, with or immediately following a meal.   potassium chloride (KLOR-CON) 10 MEQ tablet 10 mEq, Oral, 2 times daily   Propylene Glycol 0.6 % SOLN 1 drop, Both Eyes, Daily at bedtime, systane eye drops   rosuvastatin (CRESTOR) 10 mg, Oral, Daily at bedtime   sacubitril-valsartan (ENTRESTO) 49-51 MG 1 tablet, Oral, 2 times daily   tamoxifen (NOLVADEX) 10 mg, Oral, 2 times daily   Vitamin D 2,000 Units, Oral, Daily       Objective:   Physical Exam Chest:       Comments: Bony structure noted at the medial aspect of the  R clavicle. BP 138/86   Pulse 83   Temp 97.6 F (36.4 C) (Oral)   Resp 18   Ht 5\' 6"  (1.676 m)   Wt 208 lb 4 oz (94.5 kg)   SpO2 97%   BMI 33.61 kg/m  General:   Well developed, NAD, BMI noted. HEENT:  Normocephalic . Face symmetric, atraumatic Nose slightly congested.  Throat symmetric and not red. Neck: No thyromegaly, no LAD's, see graphic. Lungs:  CTA B Normal respiratory effort, no intercostal retractions, no accessory muscle use. Heart: RRR,  no murmur.  Lower extremities: no pretibial edema bilaterally  Skin: Not pale. Not jaundice Neurologic:  alert & oriented X3.  Speech normal, gait appropriate for age and unassisted Psych--  Cognition and judgment appear intact.   Cooperative with normal attention span and concentration.  Behavior appropriate. No anxious or depressed appearing.      Assessment    ASSESSMENT (new patient 07-2022, previous PCP Dr. Kirby Funk retired) Prediabetes HTN High cholesterol CV: -- CHF  -- Atrial fibrillation dx ~ 2016 -Ablation 08-2022 OSA, on CPAP dx 2021 Anxiety (chronic xanax prn) History of R breast cancer (+ lumpectomy April 2023, no chemo, no XRT) DJD  Dermatology: Dr. Margo Aye  PLAN Lump, chest: On physical exam this coincides with the R clavicle which feels to be asymmetric compared to the L.  The rest of the exam around the area is normal.  Had a chest x-ray done 06/04/2023 and there is no mention of any abnormal bone findings. For now we agreed on observation, she will monitor the area, she will let me know if any changes. URI: As described above, exam is benign, she is getting better, continue OTC Coricidin.

## 2023-09-17 NOTE — Telephone Encounter (Signed)
  Patient Consent for Virtual Visit         Shannon Liu has provided verbal consent on 09/17/2023 for a virtual visit (video or telephone).   CONSENT FOR VIRTUAL VISIT FOR:  Shannon Liu  By participating in this virtual visit I agree to the following:  I hereby voluntarily request, consent and authorize [facility] and its employed or contracted physicians, physician assistants, nurse practitioners or other licensed health care professionals (the Practitioner), to provide me with telemedicine health care services (the "Services") as deemed necessary by the treating Practitioner. I acknowledge and consent to receive the Services by the Practitioner via telemedicine. I understand that the telemedicine visit will involve communicating with the Practitioner through live audiovisual communication technology and the disclosure of certain medical information by electronic transmission. I acknowledge that I have been given the opportunity to request an in-person assessment or other available alternative prior to the telemedicine visit and am voluntarily participating in the telemedicine visit.  I understand that I have the right to withhold or withdraw my consent to the use of telemedicine in the course of my care at any time, without affecting my right to future care or treatment, and that the Practitioner or I may terminate the telemedicine visit at any time. I understand that I have the right to inspect all information obtained and/or recorded in the course of the telemedicine visit and may receive copies of available information for a reasonable fee.  I understand that some of the potential risks of receiving the Services via telemedicine include:  Delay or interruption in medical evaluation due to technological equipment failure or disruption; Information transmitted may not be sufficient (e.g. poor resolution of images) to allow for appropriate medical decision making by the  Practitioner; and/or  In rare instances, security protocols could fail, causing a breach of personal health information.  Furthermore, I acknowledge that it is my responsibility to provide information about my medical history, conditions and care that is complete and accurate to the best of my ability. I acknowledge that Practitioner's advice, recommendations, and/or decision may be based on factors not within their control, such as incomplete or inaccurate data provided by me or distortions of diagnostic images or specimens that may result from electronic transmissions. I understand that the practice of medicine is not an exact science and that Practitioner makes no warranties or guarantees regarding treatment outcomes. I acknowledge that a copy of this consent can be made available to me via my patient portal [facility] MyChart), or I can request a printed copy by calling the office of [facility].    I understand that my insurance will be billed for this visit.   I have read or had this consent read to me. I understand the contents of this consent, which adequately explains the benefits and risks of the Services being provided via telemedicine.  I have been provided ample opportunity to ask questions regarding this consent and the Services and have had my questions answered to my satisfaction. I give my informed consent for the services to be provided through the use of telemedicine in my medical care

## 2023-09-17 NOTE — Telephone Encounter (Signed)
Patient is agreeable with telehealth appt; med list reviewed and consent given.

## 2023-09-17 NOTE — Assessment & Plan Note (Signed)
Lump, chest: On physical exam this coincides with the R clavicle which feels to be asymmetric compared to the L.  The rest of the exam around the area is normal.  Had a chest x-ray done 06/04/2023 and there is no mention of any abnormal bone findings. For now we agreed on observation, she will monitor the area, she will let me know if any changes. URI: As described above, exam is benign, she is getting better, continue OTC Coricidin.

## 2023-09-29 ENCOUNTER — Ambulatory Visit: Payer: Medicare PPO | Attending: Cardiovascular Disease | Admitting: Nurse Practitioner

## 2023-09-29 DIAGNOSIS — Z01810 Encounter for preprocedural cardiovascular examination: Secondary | ICD-10-CM

## 2023-09-29 NOTE — Progress Notes (Signed)
Virtual Visit via Telephone Note   Because of Shannon Liu's co-morbid illnesses, she is at least at moderate risk for complications without adequate follow up.  This format is felt to be most appropriate for this patient at this time.  The patient did not have access to video technology/had technical difficulties with video requiring transitioning to audio format only (telephone).  All issues noted in this document were discussed and addressed.  No physical exam could be performed with this format.  Please refer to the patient's chart for her consent to telehealth for [facility].  Evaluation Performed:  Preoperative cardiovascular risk assessment _____________   Date:  09/29/2023   Patient ID:  Shannon Liu, DOB October 25, 1940, MRN [id number] Patient Location:  Home Provider location:   Office  Primary Care Provider:  Wanda Plump, MD Primary Cardiologist:  Donato Schultz, MD  Chief Complaint / Patient Profile   82 y.o. y/o female with a h/o chronic systolic heart failure, paroxysmal atrial fibrillation s/p ablation in 08/2022, hypertension, hyperlipidemia, and OSA who is pending ESI with Dr. Claria Dice of [facility] and presents today for telephonic preoperative cardiovascular risk assessment.  History of Present Illness    Shannon Liu is a 82 y.o. female who presents via audio/video conferencing for a telehealth visit today.  Pt was last seen in cardiology clinic on 04/23/2023 by Dr. Anne Fu.  She was seen in the A-fib clinic on 07/04/2023. At that time Shannon Liu was doing well.  The patient is now pending procedure as outlined above. Since her last visit, she has done well from a cardiac standpoint.   She denies chest pain, palpitations, dyspnea, pnd, orthopnea, n, v, dizziness, syncope, edema, weight gain, or early satiety. All other systems reviewed and are otherwise negative except as noted above.   Past Medical History    Past Medical History:  Diagnosis  Date   Anxiety    Arthritis    Cancer (HCC) 2023   right breast   Cataract    CHF (congestive heart failure) (HCC)    Dysrhythmia    A fib   Heart murmur    HOH (hard of hearing)    Hyperlipidemia    Hypertension    PAF (paroxysmal atrial fibrillation) (HCC)    a. dx on 06/2015 admission. spontaneously converted into NSR. placed on Eliquis   Sleep apnea    CPAP   Past Surgical History:  Procedure Laterality Date   ABDOMINAL HYSTERECTOMY     ATRIAL FIBRILLATION ABLATION N/A 09/10/2022   Procedure: ATRIAL FIBRILLATION ABLATION;  Surgeon: Regan Lemming, MD;  Location: [facility] INVASIVE CV LAB;  Service: Cardiovascular;  Laterality: N/A;   BREAST BIOPSY Right 12/06/2021   BREAST CYST EXCISION Left    BREAST LUMPECTOMY Right 01/31/2022   BREAST LUMPECTOMY WITH RADIOACTIVE SEED LOCALIZATION Right 01/31/2022   Procedure: RIGHT BREAST LUMPECTOMY WITH RADIOACTIVE SEED LOCALIZATION;  Surgeon: Harriette Bouillon, MD;  Location: [facility] OR;  Service: General;  Laterality: Right;   BREAST SURGERY Left    lumpectomy 35 yrs ago   CARDIOVERSION N/A 12/31/2021   Procedure: CARDIOVERSION;  Surgeon: Little Ishikawa, MD;  Location: [facility] ENDOSCOPY;  Service: Cardiovascular;  Laterality: N/A;   DILATION AND CURETTAGE OF UTERUS     NECK SURGERY     discectomy with cadaver bone   SPINE SURGERY  Years ago   Neck surgery   TEE WITHOUT CARDIOVERSION N/A 12/31/2021   Procedure: TRANSESOPHAGEAL ECHOCARDIOGRAM (TEE);  Surgeon: Bjorn Pippin,  Tanna Savoy, MD;  Location: [facility] ENDOSCOPY;  Service: Cardiovascular;  Laterality: N/A;   TONSILLECTOMY     removed as a child   TUBAL LIGATION      Allergies  Allergies  Allergen Reactions   Amlodipine Besylate Other (See Comments)     Edema Taking 2.5 mg with no problems   Buspirone     Other reaction(s): miserable   Fluoxetine     Other reaction(s): insomnia   Morphine And Codeine Nausea And Vomiting   Penicillins Nausea And Vomiting   Sertraline Hcl      Other reaction(s): nightmares, sexual disfunction    Home Medications    Prior to Admission medications   Medication Sig Start Date End Date Taking? Authorizing Provider  acetaminophen (TYLENOL) 500 MG tablet Take 500 mg by mouth as needed for mild pain.    [provider]  ALPRAZolam Prudy Feeler) 0.25 MG tablet Take 1 tablet (0.25 mg total) by mouth at bedtime as needed for anxiety. 03/13/23   Wanda Plump, MD  amLODipine (NORVASC) 5 MG tablet Take 1 tablet (5 mg total) by mouth daily. 09/03/23   Wanda Plump, MD  apixaban (ELIQUIS) 5 MG TABS tablet Take 1 tablet (5 mg total) by mouth 2 (two) times daily. 09/05/23   Jake Bathe, MD  Cholecalciferol (VITAMIN D) 50 MCG (2000 UT) CAPS Take 2,000 Units by mouth daily.    [provider]  diltiazem (CARDIZEM) 30 MG tablet Take 1 tablet every 4 hours AS NEEDED for AFIB heart rate >100 03/20/22   Swinyer, Zachary George, NP  furosemide (LASIX) 20 MG tablet Take 1 tablet (20 mg total) by mouth daily. 03/21/23   Jake Bathe, MD  Lactobacillus (PROBIOTIC ACIDOPHILUS PO) Take 1 tablet by mouth daily.    [provider]  metoprolol succinate (TOPROL-XL) 25 MG 24 hr tablet Take 0.5 tablets (12.5 mg total) by mouth daily. with or immediately following a meal. Patient taking differently: Take 25 mg by mouth daily. with or immediately following a meal. 06/20/23   Fenton, Clint R, PA  potassium chloride (KLOR-CON) 10 MEQ tablet Take 1 tablet (10 mEq total) by mouth 2 (two) times daily. 06/12/23   Camnitz, Andree Coss, MD  Propylene Glycol 0.6 % SOLN Place 1 drop into both eyes at bedtime. systane eye drops    [provider]  rosuvastatin (CRESTOR) 10 MG tablet Take 1 tablet (10 mg total) by mouth at bedtime. 09/03/23   Wanda Plump, MD  sacubitril-valsartan (ENTRESTO) 49-51 MG Take 1 tablet by mouth 2 (two) times daily. 06/03/23   Jake Bathe, MD  tamoxifen (NOLVADEX) 10 MG tablet Take 1 tablet (10 mg total) by mouth 2 (two) times  daily. 09/04/23   Carlean Jews, NP    Physical Exam    Vital Signs:  Shannon Liu does not have vital signs available for review today.  Given telephonic nature of communication, physical exam is limited. AAOx3. NAD. Normal affect.  Speech and respirations are unlabored.  Accessory Clinical Findings    None  Assessment & Plan    1.  Preoperative Cardiovascular Risk Assessment:  According to the Revised Cardiac Risk Index (RCRI), her Perioperative Risk of Major Cardiac Event is (%): 0.9. Her Functional Capacity in METs is: 4.95 according to the Duke Activity Status Index (DASI).Therefore, based on ACC/AHA guidelines, patient would be at acceptable risk for the planned procedure without further cardiovascular testing.   The patient was advised that if  she develops new symptoms prior to surgery to contact our office to arrange for a follow-up visit, and she verbalized understanding.  Per office protocol, patient can hold Eliquis for 3 days prior to procedure.  Please resume Eliquis as soon as possible postprocedure, at the discretion of the surgeon.    A copy of this note will be routed to requesting surgeon.  Time:   Today, I have spent 5 minutes with the patient with telehealth technology discussing medical history, symptoms, and management plan.     Joylene Grapes, NP  09/29/2023, 10:07 AM

## 2023-10-01 DIAGNOSIS — G4733 Obstructive sleep apnea (adult) (pediatric): Secondary | ICD-10-CM | POA: Diagnosis not present

## 2023-10-11 ENCOUNTER — Encounter (HOSPITAL_COMMUNITY): Payer: Self-pay

## 2023-10-13 ENCOUNTER — Other Ambulatory Visit (HOSPITAL_COMMUNITY): Payer: Self-pay

## 2023-10-29 DIAGNOSIS — H53001 Unspecified amblyopia, right eye: Secondary | ICD-10-CM | POA: Diagnosis not present

## 2023-10-29 DIAGNOSIS — H52203 Unspecified astigmatism, bilateral: Secondary | ICD-10-CM | POA: Diagnosis not present

## 2023-10-29 DIAGNOSIS — Z961 Presence of intraocular lens: Secondary | ICD-10-CM | POA: Diagnosis not present

## 2023-11-03 ENCOUNTER — Other Ambulatory Visit: Payer: Self-pay

## 2023-11-04 DIAGNOSIS — M5416 Radiculopathy, lumbar region: Secondary | ICD-10-CM | POA: Diagnosis not present

## 2023-11-11 ENCOUNTER — Ambulatory Visit
Admission: RE | Admit: 2023-11-11 | Discharge: 2023-11-11 | Disposition: A | Discharge status: Home / Self Care | Payer: Medicare PPO | Source: Ambulatory Visit | Attending: Hematology | Admitting: Hematology

## 2023-11-11 DIAGNOSIS — C50411 Malignant neoplasm of upper-outer quadrant of right female breast: Secondary | ICD-10-CM | POA: Diagnosis not present

## 2023-11-11 DIAGNOSIS — Z170 Estrogen receptor positive status [ER+]: Secondary | ICD-10-CM | POA: Diagnosis not present

## 2023-11-11 DIAGNOSIS — Z853 Personal history of malignant neoplasm of breast: Secondary | ICD-10-CM | POA: Diagnosis not present

## 2023-11-22 ENCOUNTER — Encounter (HOSPITAL_COMMUNITY): Payer: Self-pay

## 2023-11-24 ENCOUNTER — Other Ambulatory Visit (HOSPITAL_COMMUNITY): Payer: Self-pay | Admitting: Physician Assistant

## 2023-11-24 ENCOUNTER — Other Ambulatory Visit (HOSPITAL_COMMUNITY): Payer: Self-pay

## 2023-11-24 MED ORDER — METOPROLOL SUCCINATE ER 25 MG PO TB24
12.5000 mg | ORAL_TABLET | Freq: Every day | ORAL | 2 refills | Status: DC
  Filled 2023-11-24: qty 45, 90d supply, fill #0

## 2023-11-25 ENCOUNTER — Other Ambulatory Visit (HOSPITAL_COMMUNITY): Payer: Self-pay

## 2023-12-04 ENCOUNTER — Encounter: Payer: Self-pay | Admitting: Pharmacist

## 2023-12-04 ENCOUNTER — Other Ambulatory Visit (HOSPITAL_COMMUNITY): Payer: Self-pay

## 2023-12-04 ENCOUNTER — Other Ambulatory Visit: Payer: Self-pay

## 2023-12-04 ENCOUNTER — Other Ambulatory Visit: Payer: Self-pay | Admitting: Cardiology

## 2023-12-04 ENCOUNTER — Telehealth: Payer: Self-pay | Admitting: Internal Medicine

## 2023-12-04 MED ORDER — ALPRAZOLAM 0.25 MG PO TABS
0.2500 mg | ORAL_TABLET | Freq: Every evening | ORAL | 2 refills | Status: DC | PRN
  Filled 2023-12-04: qty 30, 30d supply, fill #0
  Filled 2024-03-03: qty 30, 30d supply, fill #1
  Filled 2024-05-26: qty 30, 30d supply, fill #2

## 2023-12-04 NOTE — Telephone Encounter (Signed)
Pdmp ok, rx sent  ? ?

## 2023-12-04 NOTE — Telephone Encounter (Signed)
 Requesting: alprazolam 0.25mg   Contract: 03/19/23 UDS: 03/19/23 Last Visit: 09/17/23 Next Visit: 12/31/23 Last Refill: 03/13/23 #30 and 0RF   Please Advise

## 2023-12-05 ENCOUNTER — Other Ambulatory Visit: Payer: Self-pay

## 2023-12-05 ENCOUNTER — Other Ambulatory Visit (HOSPITAL_COMMUNITY): Payer: Self-pay

## 2023-12-05 MED ORDER — POTASSIUM CHLORIDE ER 10 MEQ PO TBCR
10.0000 meq | EXTENDED_RELEASE_TABLET | Freq: Two times a day (BID) | ORAL | 1 refills | Status: DC
  Filled 2023-12-05: qty 180, 90d supply, fill #0
  Filled 2024-03-03: qty 180, 90d supply, fill #1

## 2023-12-08 ENCOUNTER — Other Ambulatory Visit: Payer: Self-pay

## 2023-12-08 ENCOUNTER — Other Ambulatory Visit (HOSPITAL_COMMUNITY): Payer: Self-pay

## 2023-12-08 DIAGNOSIS — M48062 Spinal stenosis, lumbar region with neurogenic claudication: Secondary | ICD-10-CM | POA: Diagnosis not present

## 2023-12-08 MED ORDER — GABAPENTIN 100 MG PO CAPS
100.0000 mg | ORAL_CAPSULE | Freq: Two times a day (BID) | ORAL | 2 refills | Status: DC | PRN
  Filled 2023-12-08: qty 60, 30d supply, fill #0
  Filled 2024-01-12: qty 60, 30d supply, fill #1

## 2023-12-19 ENCOUNTER — Ambulatory Visit (HOSPITAL_COMMUNITY)
Admission: RE | Admit: 2023-12-19 | Discharge: 2023-12-19 | Disposition: A | Discharge status: Home / Self Care | Payer: Medicare PPO | Source: Ambulatory Visit | Attending: Physician Assistant | Admitting: Physician Assistant

## 2023-12-19 ENCOUNTER — Other Ambulatory Visit: Payer: Self-pay

## 2023-12-19 ENCOUNTER — Other Ambulatory Visit (HOSPITAL_COMMUNITY): Payer: Self-pay

## 2023-12-19 ENCOUNTER — Encounter (HOSPITAL_COMMUNITY): Payer: Self-pay | Admitting: Physician Assistant

## 2023-12-19 VITALS — BP 134/96 | HR 87 | Ht 66.0 in | Wt 212.4 lb

## 2023-12-19 DIAGNOSIS — Z6834 Body mass index (BMI) 34.0-34.9, adult: Secondary | ICD-10-CM | POA: Diagnosis not present

## 2023-12-19 DIAGNOSIS — G4733 Obstructive sleep apnea (adult) (pediatric): Secondary | ICD-10-CM | POA: Insufficient documentation

## 2023-12-19 DIAGNOSIS — E785 Hyperlipidemia, unspecified: Secondary | ICD-10-CM | POA: Diagnosis not present

## 2023-12-19 DIAGNOSIS — I5022 Chronic systolic (congestive) heart failure: Secondary | ICD-10-CM | POA: Insufficient documentation

## 2023-12-19 DIAGNOSIS — D6869 Other thrombophilia: Secondary | ICD-10-CM | POA: Diagnosis not present

## 2023-12-19 DIAGNOSIS — Z7901 Long term (current) use of anticoagulants: Secondary | ICD-10-CM | POA: Diagnosis not present

## 2023-12-19 DIAGNOSIS — I110 Hypertensive heart disease with heart failure: Secondary | ICD-10-CM | POA: Diagnosis not present

## 2023-12-19 DIAGNOSIS — I480 Paroxysmal atrial fibrillation: Secondary | ICD-10-CM | POA: Diagnosis not present

## 2023-12-19 DIAGNOSIS — E669 Obesity, unspecified: Secondary | ICD-10-CM | POA: Diagnosis not present

## 2023-12-19 MED ORDER — DILTIAZEM HCL 30 MG PO TABS
30.0000 mg | ORAL_TABLET | ORAL | 0 refills | Status: AC | PRN
  Filled 2023-12-19: qty 45, 8d supply, fill #0

## 2023-12-19 NOTE — Progress Notes (Signed)
 Primary Care Physician: Wanda Plump, MD Primary Cardiologist: Dr Anne Fu Primary Electrophysiologist: Dr Elberta Fortis  Referring Physician: Dr Marko Stai is a 83 y.o. female with a history of HTN, HLD, OSA, breast cancer, HFrEF, atrial fibrillation who presents for follow up in the [facility]. Patient is s/p afib ablation with Dr Elberta Fortis on 09/10/22. Patient is on Eliquis for stroke prevention.   Patient returns for follow up for atrial fibrillation. She remains in SR. She does have brief palpitations, lasting only 1-2 seconds. She does report being more anxious recently, especially at night. She plans to discuss this at her upcoming visit with her PCP. No bleeding issues on anticoagulation.   Today, he denies symptoms of chest pain, shortness of breath, orthopnea, PND, lower extremity edema, dizziness, presyncope, syncope, snoring, daytime somnolence, bleeding, or neurologic sequela. The patient is tolerating medications without difficulties and is otherwise without complaint today.    Atrial Fibrillation Risk Factors:  she does have symptoms or diagnosis of sleep apnea. she does not have a history of rheumatic fever. she does not have a history of alcohol use.   Atrial Fibrillation Management history:  Previous antiarrhythmic drugs: none Previous cardioversions: 12/31/21 Previous ablations: 09/10/22 Anticoagulation history: Eliquis   Past Medical History:  Diagnosis Date   Anxiety    Arthritis    Cancer (HCC) 2023   right breast   Cataract    CHF (congestive heart failure) (HCC)    Dysrhythmia    A fib   Heart murmur    HOH (hard of hearing)    Hyperlipidemia    Hypertension    PAF (paroxysmal atrial fibrillation) (HCC)    a. dx on 06/2015 admission. spontaneously converted into NSR. placed on Eliquis   Sleep apnea    CPAP     Current Outpatient Medications  Medication Sig Dispense Refill   acetaminophen (TYLENOL) 500 MG  tablet Take 500 mg by mouth as needed for mild pain.     ALPRAZolam (XANAX) 0.25 MG tablet Take 1 tablet (0.25 mg total) by mouth at bedtime as needed for anxiety. 30 tablet 2   amLODipine (NORVASC) 5 MG tablet Take 1 tablet (5 mg total) by mouth daily. 90 tablet 1   apixaban (ELIQUIS) 5 MG TABS tablet Take 1 tablet (5 mg total) by mouth 2 (two) times daily. 180 tablet 1   Cholecalciferol (VITAMIN D) 50 MCG (2000 UT) CAPS Take 2,000 Units by mouth daily.     furosemide (LASIX) 20 MG tablet Take 1 tablet (20 mg total) by mouth daily. 90 tablet 3   gabapentin (NEURONTIN) 100 MG capsule Take 1 capsule (100 mg total) by mouth 2 (two) times daily as needed for pain. 60 capsule 2   Lactobacillus (PROBIOTIC ACIDOPHILUS PO) Take 1 tablet by mouth daily.     metoprolol succinate (TOPROL-XL) 25 MG 24 hr tablet Take 0.5 tablets (12.5 mg total) by mouth daily with or immediately following a meal. (Patient taking differently: Take 25 mg by mouth daily.) 45 tablet 2   potassium chloride (KLOR-CON) 10 MEQ tablet Take 1 tablet (10 mEq total) by mouth 2 (two) times daily. 180 tablet 1   Propylene Glycol 0.6 % SOLN Place 1 drop into both eyes at bedtime. systane eye drops     rosuvastatin (CRESTOR) 10 MG tablet Take 1 tablet (10 mg total) by mouth at bedtime. 90 tablet 1   sacubitril-valsartan (ENTRESTO) 49-51 MG Take 1 tablet by mouth 2 (  two) times daily. 180 tablet 3   tamoxifen (NOLVADEX) 10 MG tablet Take 1 tablet (10 mg total) by mouth 2 (two) times daily. 60 tablet 1   diltiazem (CARDIZEM) 30 MG tablet Take 1 tablet every 4 hours AS NEEDED for AFIB heart rate >100 45 tablet 0   No current facility-administered medications for this encounter.    ROS- All systems are reviewed and negative except as per the HPI above.  Physical Exam: Vitals:   12/19/23 1137  BP: (!) 134/96  Pulse: 87  Weight: 96.3 kg  Height: 5\' 6"  (1.676 m)    GEN: Well nourished, well developed in no acute distress CARDIAC: Regular  rate and rhythm with occasional ectopy, no murmurs, rubs, gallops RESPIRATORY:  Clear to auscultation without rales, wheezing or rhonchi  ABDOMEN: Soft, non-tender, non-distended EXTREMITIES:  No edema; No deformity    Wt Readings from Last 3 Encounters:  12/19/23 96.3 kg  09/17/23 94.5 kg  08/27/23 96.4 kg    EKG today demonstrates  SR, PVCs Vent. rate 87 BPM PR interval 168 ms QRS duration 68 ms QT/QTcB 374/450 ms   Echo 11/26/22 demonstrated  1. Left ventricular ejection fraction, by estimation, is 45 to 50%. The  left ventricle has mildly decreased function. The left ventricle  demonstrates global hypokinesis. Left ventricular diastolic parameters are  consistent with Grade I diastolic dysfunction (impaired relaxation).   2. Right ventricular systolic function is normal. The right ventricular  size is normal. There is normal pulmonary artery systolic pressure.   3. The mitral valve is normal in structure. Trivial mitral valve  regurgitation. No evidence of mitral stenosis.   4. The aortic valve was not well visualized. Aortic valve regurgitation  is not visualized. No aortic stenosis is present.   5. Aortic dilatation noted. There is mild dilatation of the ascending  aorta, measuring 37 mm.   6. The inferior vena cava is normal in size with greater than 50%  respiratory variability, suggesting right atrial pressure of 3 mmHg.   Epic records are reviewed at length today.  CHA2DS2-VASc Score = 5  The patient's score is based upon: CHF History: 1 HTN History: 1 Diabetes History: 0 Stroke History: 0 Vascular Disease History: 0 Age Score: 2 Gender Score: 1       ASSESSMENT AND PLAN: Paroxysmal Atrial Fibrillation (ICD10:  I48.0) The patient's CHA2DS2-VASc score is 5, indicating a 7.2% annual risk of stroke.   S/p afib ablation 09/10/22 Patient appears to be maintaining SR. Palpitations consistent with PVCs. We did discuss smart device technology to monitor at home  [facility] mobile).  Continue Eliquis 5 mg BID Continue Toprol 12.5 mg daily Continue diltiazem 30 mg PRN q 4 hours for heart racing  Secondary Hypercoagulable State (ICD10:  D68.69) The patient is at significant risk for stroke/thromboembolism based upon her CHA2DS2-VASc Score of 5.  Continue Apixaban (Eliquis). No bleeding issues.  Obesity Body mass index is 34.28 kg/m.  Encouraged lifestyle modification  OSA  Encouraged nightly CPAP  HTN Stable on current regimen  HFmrEF EF 45-50% GDMT per primary cardiology team Fluid status appears stable today   Follow up with Dr Anne Fu per recall. AF clinic in one year.    Jorja Loa PA-C Afib Clinic [facility] [address] [place], [place] [address] [phone] 12/19/2023 11:49 AM

## 2023-12-19 NOTE — Patient Instructions (Signed)
 [employer] mobile (1 lead)

## 2023-12-31 ENCOUNTER — Telehealth (HOSPITAL_BASED_OUTPATIENT_CLINIC_OR_DEPARTMENT_OTHER): Payer: Self-pay

## 2023-12-31 ENCOUNTER — Ambulatory Visit (INDEPENDENT_AMBULATORY_CARE_PROVIDER_SITE_OTHER): Payer: Medicare PPO | Admitting: Internal Medicine

## 2023-12-31 ENCOUNTER — Encounter: Payer: Self-pay | Admitting: Internal Medicine

## 2023-12-31 VITALS — BP 118/84 | HR 77 | Temp 98.4°F | Resp 18 | Ht 66.0 in | Wt 214.2 lb

## 2023-12-31 DIAGNOSIS — I5032 Chronic diastolic (congestive) heart failure: Secondary | ICD-10-CM

## 2023-12-31 DIAGNOSIS — E7800 Pure hypercholesterolemia, unspecified: Secondary | ICD-10-CM | POA: Diagnosis not present

## 2023-12-31 DIAGNOSIS — Z23 Encounter for immunization: Secondary | ICD-10-CM | POA: Diagnosis not present

## 2023-12-31 DIAGNOSIS — I480 Paroxysmal atrial fibrillation: Secondary | ICD-10-CM

## 2023-12-31 DIAGNOSIS — E010 Iodine-deficiency related diffuse (endemic) goiter: Secondary | ICD-10-CM | POA: Diagnosis not present

## 2023-12-31 DIAGNOSIS — Z0001 Encounter for general adult medical examination with abnormal findings: Secondary | ICD-10-CM | POA: Diagnosis not present

## 2023-12-31 DIAGNOSIS — R739 Hyperglycemia, unspecified: Secondary | ICD-10-CM

## 2023-12-31 LAB — CBC WITH DIFFERENTIAL/PLATELET
Basophils Absolute: 0.1 10*3/uL (ref 0.0–0.1)
Basophils Relative: 0.9 % (ref 0.0–3.0)
Eosinophils Absolute: 0.2 10*3/uL (ref 0.0–0.7)
Eosinophils Relative: 2.4 % (ref 0.0–5.0)
HCT: 42.6 % (ref 36.0–46.0)
Hemoglobin: 14 g/dL (ref 12.0–15.0)
Lymphocytes Relative: 22.2 % (ref 12.0–46.0)
Lymphs Abs: 1.5 10*3/uL (ref 0.7–4.0)
MCHC: 32.9 g/dL (ref 30.0–36.0)
MCV: 91.8 fl (ref 78.0–100.0)
Monocytes Absolute: 0.8 10*3/uL (ref 0.1–1.0)
Monocytes Relative: 11.7 % (ref 3.0–12.0)
Neutro Abs: 4.2 10*3/uL (ref 1.4–7.7)
Neutrophils Relative %: 62.8 % (ref 43.0–77.0)
Platelets: 205 10*3/uL (ref 150.0–400.0)
RBC: 4.64 Mil/uL (ref 3.87–5.11)
RDW: 14.2 % (ref 11.5–15.5)
WBC: 6.6 10*3/uL (ref 4.0–10.5)

## 2023-12-31 LAB — HEMOGLOBIN A1C: Hgb A1c MFr Bld: 6.1 % (ref 4.6–6.5)

## 2023-12-31 NOTE — Assessment & Plan Note (Signed)
 Here for CPX Tdap 2016 Shingrix x 2 PNM 13: 2015, PNM 23: 2012. PNM 20: today  Had a flu shot  Vaccines I recommend:  COVID booster if not done recently, RSV. CCS: Had a colonoscopy 04/13/2019, per pathology report, no follow-up. MMG 10/2023 Healthcare POA: Charted DEXA 05/30/2022: Normal

## 2023-12-31 NOTE — Assessment & Plan Note (Signed)
 Here for CPX  Other issues addressed:  Prediabetes: Check A1c High cholesterol: Check a lipid panel, LFTs, continue rosuvastatin. Paroxysmal A-fib, heart failure: Last seen by the A-fib team 12/19/2023, was recommended to continue on diltiazem as needed, metoprolol and Eliquis Next visit with primary cardiologist 04-2024. Continue same medications, checking labs. Breast cancer: Patient having a number of symptoms related to Nolvadex, stop date 2 weeks ago and feels better.  Recommend to discuss with oncology pros and cons of that medication. Anxiety: Mostly at night and triggered at Nolvadex.  She is on a low-dose of Xanax, I am not opposed for her to continue taking it as needed.  Aware of risk of addiction which in her case I think is low. Back pain: Had a MRI and a local injection which did not help, her orthopedic doctor prescribed gabapentin, she likes my opinion regards that medication,  I am not opposed, in fact encourage her to try.  If it works, she could actually go to a higher dose. Thyromegaly: Questionable thyromegaly on physical exam.  Check a TSH and an ultrasound. RTC 4 months.

## 2023-12-31 NOTE — Patient Instructions (Signed)
 Vaccines I recommend: A COVID booster at your convenience, RSV.  GO TO THE LAB : Get the blood work     Please go to the front desk: Arrange for a follow-up in 4 months    STOP BY THE FIRST FLOOR: Arrange for ultrasound of your thyroid

## 2023-12-31 NOTE — Progress Notes (Signed)
 Subjective:    Patient ID: Shannon Liu, female    DOB: 10-Dec-1940, 83 y.o.   MRN: [id number]  DOS:  12/31/2023 Type of visit - description: CPX  Here for CPX Chronic medical problems addressed. She has multiple questions about her medications. Reports side effects from Nolvadex: Hot flashes, anxiety; she self stopped Nolvadex 2 weeks ago and symptoms are much better. H/o back pain, on going , managed elsewhere.   Review of Systems  Other than above, a 14 point review of systems is negative       Past Medical History:  Diagnosis Date   Anxiety    Arthritis    Cancer (HCC) 2023   right breast   Cataract    CHF (congestive heart failure) (HCC)    Dysrhythmia    A fib   Heart murmur    HOH (hard of hearing)    Hyperlipidemia    Hypertension    PAF (paroxysmal atrial fibrillation) (HCC)    a. dx on 06/2015 admission. spontaneously converted into NSR. placed on Eliquis   Sleep apnea    CPAP    Past Surgical History:  Procedure Laterality Date   ABDOMINAL HYSTERECTOMY     ATRIAL FIBRILLATION ABLATION N/A 09/10/2022   Procedure: ATRIAL FIBRILLATION ABLATION;  Surgeon: Regan Lemming, MD;  Location: [facility] INVASIVE CV LAB;  Service: Cardiovascular;  Laterality: N/A;   BREAST BIOPSY Right 12/06/2021   BREAST CYST EXCISION Left    BREAST LUMPECTOMY Right 01/31/2022   BREAST LUMPECTOMY WITH RADIOACTIVE SEED LOCALIZATION Right 01/31/2022   Procedure: RIGHT BREAST LUMPECTOMY WITH RADIOACTIVE SEED LOCALIZATION;  Surgeon: Harriette Bouillon, MD;  Location: [facility] OR;  Service: General;  Laterality: Right;   BREAST SURGERY Left    lumpectomy 35 yrs ago   CARDIOVERSION N/A 12/31/2021   Procedure: CARDIOVERSION;  Surgeon: Little Ishikawa, MD;  Location: [facility] ENDOSCOPY;  Service: Cardiovascular;  Laterality: N/A;   DILATION AND CURETTAGE OF UTERUS     NECK SURGERY     discectomy with cadaver bone   SPINE SURGERY  Years ago   Neck surgery   TEE WITHOUT CARDIOVERSION N/A  12/31/2021   Procedure: TRANSESOPHAGEAL ECHOCARDIOGRAM (TEE);  Surgeon: Little Ishikawa, MD;  Location: [facility] ENDOSCOPY;  Service: Cardiovascular;  Laterality: N/A;   TONSILLECTOMY     removed as a child   TUBAL LIGATION     Social History   Socioeconomic History   Marital status: Widowed    Spouse name: Not on file   Number of children: 2   Years of education: Not on file   Highest education level: 12th grade  Occupational History   Occupation: Retired- [place] [employer] office  Tobacco Use   Smoking status: Never   Smokeless tobacco: Never   Tobacco comments:    Never smoked 12/19/23  Vaping Use   Vaping status: Never Used  Substance and Sexual Activity   Alcohol use: No    Alcohol/week: 0.0 standard drinks of alcohol   Drug use: No   Sexual activity: Not Currently  Other Topics Concern   Not on file  Social History Narrative   Lives [place], near [place].    Daughter currently lives with her.   Social Drivers of Corporate investment banker Strain: Low Risk  (08/27/2023)   Overall Financial Resource Strain (CARDIA)    Difficulty of Paying Living Expenses: Not hard at all  Food Insecurity: No Food Insecurity (08/27/2023)   Hunger Vital Sign  Worried About Programme researcher, broadcasting/film/video in the Last Year: Never true    Ran Out of Food in the Last Year: Never true  Transportation Needs: No Transportation Needs (08/27/2023)   PRAPARE - Administrator, Civil Service (Medical): No    Lack of Transportation (Non-Medical): No  Physical Activity: Insufficiently Active (08/27/2023)   Exercise Vital Sign    Days of Exercise per Week: 2 days    Minutes of Exercise per Session: 20 min  Stress: No Stress Concern Present (08/27/2023)   [employer] of Occupational Health - Occupational Stress Questionnaire    Feeling of Stress : Only a little  Recent Concern: Stress - Stress Concern Present (07/25/2023)   [employer] of Occupational  Health - Occupational Stress Questionnaire    Feeling of Stress : To some extent  Social Connections: Moderately Integrated (08/27/2023)   Social Connection and Isolation Panel [NHANES]    Frequency of Communication with Friends and Family: More than three times a week    Frequency of Social Gatherings with Friends and Family: Twice a week    Attends Religious Services: More than 4 times per year    Active Member of [employer] or Organizations: Yes    Attends Banker Meetings: More than 4 times per year    Marital Status: Widowed  Intimate Partner Violence: Not At Risk (08/29/2023)   Humiliation, Afraid, Rape, and Kick questionnaire    Fear of Current or Ex-Partner: No    Emotionally Abused: No    Physically Abused: No    Sexually Abused: No    Current Outpatient Medications  Medication Instructions   acetaminophen (TYLENOL) 500 mg, As needed   ALPRAZolam (XANAX) 0.25 MG tablet Take 1 tablet (0.25 mg total) by mouth at bedtime as needed for anxiety.   amLODipine (NORVASC) 5 mg, Oral, Daily   diltiazem (CARDIZEM) 30 MG tablet Take 1 tablet every 4 hours AS NEEDED for AFIB heart rate >100   Eliquis 5 mg, Oral, 2 times daily   furosemide (LASIX) 20 mg, Oral, Daily   gabapentin (NEURONTIN) 100 MG capsule Take 1 capsule (100 mg total) by mouth 2 (two) times daily as needed for pain.   Lactobacillus (PROBIOTIC ACIDOPHILUS PO) 1 tablet, Daily   metoprolol succinate (TOPROL-XL) 25 MG 24 hr tablet Take 0.5 tablets (12.5 mg total) by mouth daily with or immediately following a meal.   potassium chloride (KLOR-CON) 10 MEQ tablet 10 mEq, Oral, 2 times daily   Propylene Glycol 0.6 % SOLN 1 drop, Daily at bedtime   rosuvastatin (CRESTOR) 10 mg, Oral, Daily at bedtime   sacubitril-valsartan (ENTRESTO) 49-51 MG 1 tablet, Oral, 2 times daily   tamoxifen (NOLVADEX) 10 mg, Oral, 2 times daily   Vitamin D 2,000 Units, Daily       Objective:   Physical Exam BP 118/84 (BP Location: Left  Arm, Patient Position: Sitting, Cuff Size: Large)   Pulse 77   Temp 98.4 F (36.9 C) (Oral)   Resp 18   Ht 5\' 6"  (1.676 m)   Wt 214 lb 3.2 oz (97.2 kg)   SpO2 98%   BMI 34.57 kg/m  General: Well developed, NAD, BMI noted Neck: Mild thyromegaly?  Tender to palpation HEENT:  Normocephalic . Face symmetric, atraumatic Lungs:  CTA B Normal respiratory effort, no intercostal retractions, no accessory muscle use. Heart: RRR,  no murmur.  Abdomen:  Not distended, soft, non-tender. No rebound or rigidity.   Lower extremities: Trace pretibial edema  bilaterally  Skin: Exposed areas without rash. Not pale. Not jaundice Neurologic:  alert & oriented X3.  Speech normal, gait appropriate for age and unassisted Strength symmetric and appropriate for age.  Psych: Cognition and judgment appear intact.  Cooperative with normal attention span and concentration.  Behavior appropriate. No anxious or depressed appearing.     Assessment    ASSESSMENT (new patient 07-2022, previous PCP Dr. Kirby Funk retired) Prediabetes HTN High cholesterol CV: -- CHF  -- Atrial fibrillation dx ~ 2016 -Ablation 08-2022 OSA, on CPAP dx 2021 Anxiety (chronic xanax prn) History of R breast cancer (+ lumpectomy April 2023, no chemo, no XRT) DJD  Dermatology: Dr. Debarah Crape Here for CPX Tdap 2016 Shingrix x 2 PNM 13: 2015, PNM 23: 2012. PNM 20: today  Had a flu shot  Vaccines I recommend:  COVID booster if not done recently, RSV. CCS: Had a colonoscopy 04/13/2019, per pathology report, no follow-up. MMG 10/2023 Healthcare POA: Charted DEXA 05/30/2022: Normal Other issues addressed:  Prediabetes: Check A1c High cholesterol: Check a lipid panel, LFTs, continue rosuvastatin. Paroxysmal A-fib, heart failure: Last seen by the A-fib team 12/19/2023, was recommended to continue on diltiazem as needed, metoprolol and Eliquis Next visit with primary cardiologist 04-2024. Continue same medications, checking  labs. Breast cancer: Patient having a number of symptoms related to Nolvadex, stop date 2 weeks ago and feels better.  Recommend to discuss with oncology pros and cons of that medication. Anxiety: Mostly at night and triggered at Nolvadex.  She is on a low-dose of Xanax, I am not opposed for her to continue taking it as needed.  Aware of risk of addiction which in her case I think is low. Back pain: Had a MRI and a local injection which did not help, her orthopedic doctor prescribed gabapentin, she likes my opinion regards that medication,  I am not opposed, in fact encourage her to try.  If it works, she could actually go to a higher dose. Thyromegaly: Questionable thyromegaly on physical exam.  Check a TSH and an ultrasound. RTC 4 months.   In addition to CPX, has risk her chronic medical problems, multiple questions regards her medications answered to the best of my ability

## 2024-01-01 ENCOUNTER — Telehealth: Payer: Self-pay

## 2024-01-01 NOTE — Telephone Encounter (Signed)
 Shannon Liu is aware and calling pt back.

## 2024-01-01 NOTE — Telephone Encounter (Signed)
 Copied from CRM [id number]. Topic: General - Call Back - No Documentation >> Jan 01, 2024 12:27 PM Shannon Liu wrote: Reason for CRM: Patient missed call fro Myrene Buddy and was told to call back. I did not see any notes. Patient stated it was about lab results. I informed patient that lab results have not been released yet.

## 2024-01-02 ENCOUNTER — Other Ambulatory Visit (HOSPITAL_COMMUNITY): Payer: Self-pay

## 2024-01-02 ENCOUNTER — Other Ambulatory Visit (HOSPITAL_COMMUNITY): Payer: Self-pay | Admitting: Physician Assistant

## 2024-01-02 ENCOUNTER — Encounter (HOSPITAL_COMMUNITY): Payer: Self-pay

## 2024-01-02 MED ORDER — METOPROLOL SUCCINATE ER 25 MG PO TB24
25.0000 mg | ORAL_TABLET | Freq: Every day | ORAL | 1 refills | Status: DC
  Filled 2024-01-02 – 2024-01-13 (×3): qty 90, 90d supply, fill #0
  Filled 2024-03-03 – 2024-04-07 (×2): qty 90, 90d supply, fill #1

## 2024-01-02 NOTE — Addendum Note (Signed)
 Addended by: Mervin Kung A on: 01/02/2024 11:57 AM   Modules accepted: Orders

## 2024-01-03 ENCOUNTER — Ambulatory Visit (HOSPITAL_BASED_OUTPATIENT_CLINIC_OR_DEPARTMENT_OTHER)
Admission: RE | Admit: 2024-01-03 | Discharge: 2024-01-03 | Disposition: A | Discharge status: Home / Self Care | Source: Ambulatory Visit | Attending: Internal Medicine | Admitting: Internal Medicine

## 2024-01-03 DIAGNOSIS — E041 Nontoxic single thyroid nodule: Secondary | ICD-10-CM | POA: Diagnosis not present

## 2024-01-03 DIAGNOSIS — E010 Iodine-deficiency related diffuse (endemic) goiter: Secondary | ICD-10-CM | POA: Insufficient documentation

## 2024-01-05 ENCOUNTER — Other Ambulatory Visit (INDEPENDENT_AMBULATORY_CARE_PROVIDER_SITE_OTHER)

## 2024-01-05 DIAGNOSIS — E010 Iodine-deficiency related diffuse (endemic) goiter: Secondary | ICD-10-CM | POA: Diagnosis not present

## 2024-01-05 DIAGNOSIS — E7800 Pure hypercholesterolemia, unspecified: Secondary | ICD-10-CM | POA: Diagnosis not present

## 2024-01-05 DIAGNOSIS — I5032 Chronic diastolic (congestive) heart failure: Secondary | ICD-10-CM | POA: Diagnosis not present

## 2024-01-05 LAB — COMPREHENSIVE METABOLIC PANEL
ALT: 10 U/L (ref 0–35)
AST: 14 U/L (ref 0–37)
Albumin: 4 g/dL (ref 3.5–5.2)
Alkaline Phosphatase: 72 U/L (ref 39–117)
BUN: 17 mg/dL (ref 6–23)
CO2: 28 meq/L (ref 19–32)
Calcium: 9.1 mg/dL (ref 8.4–10.5)
Chloride: 108 meq/L (ref 96–112)
Creatinine, Ser: 0.7 mg/dL (ref 0.40–1.20)
GFR: 80.56 mL/min (ref >60.00)
Glucose, Bld: 113 mg/dL — ABNORMAL HIGH (ref 70–99)
Potassium: 4.1 meq/L (ref 3.5–5.1)
Sodium: 142 meq/L (ref 135–145)
Total Bilirubin: 0.6 mg/dL (ref 0.2–1.2)
Total Protein: 5.8 g/dL — ABNORMAL LOW (ref 6.0–8.3)

## 2024-01-05 LAB — TSH: TSH: 1.11 u[IU]/mL (ref 0.35–5.50)

## 2024-01-05 LAB — LIPID PANEL
Cholesterol: 151 mg/dL (ref 0–200)
HDL: 46.4 mg/dL (ref >39.00)
LDL Cholesterol: 77 mg/dL (ref 0–99)
NonHDL: 104.65
Total CHOL/HDL Ratio: 3
Triglycerides: 137 mg/dL (ref 0.0–149.0)
VLDL: 27.4 mg/dL (ref 0.0–40.0)

## 2024-01-06 ENCOUNTER — Encounter: Payer: Self-pay | Admitting: Internal Medicine

## 2024-01-07 DIAGNOSIS — G4733 Obstructive sleep apnea (adult) (pediatric): Secondary | ICD-10-CM | POA: Diagnosis not present

## 2024-01-13 ENCOUNTER — Encounter (HOSPITAL_COMMUNITY): Payer: Self-pay | Admitting: Pharmacist

## 2024-01-13 ENCOUNTER — Other Ambulatory Visit (HOSPITAL_COMMUNITY): Payer: Self-pay

## 2024-01-14 ENCOUNTER — Encounter: Payer: Self-pay | Admitting: Internal Medicine

## 2024-01-14 NOTE — Addendum Note (Signed)
 Addended byConrad Baytown D on: 01/14/2024 03:41 PM   Modules accepted: Orders

## 2024-02-06 ENCOUNTER — Telehealth: Payer: Self-pay | Admitting: Adult Health

## 2024-02-23 DIAGNOSIS — M25562 Pain in left knee: Secondary | ICD-10-CM | POA: Diagnosis not present

## 2024-02-24 ENCOUNTER — Encounter: Payer: Self-pay | Admitting: Internal Medicine

## 2024-02-25 ENCOUNTER — Other Ambulatory Visit (HOSPITAL_COMMUNITY): Payer: Self-pay

## 2024-02-25 MED ORDER — GABAPENTIN 100 MG PO CAPS
100.0000 mg | ORAL_CAPSULE | Freq: Two times a day (BID) | ORAL | 12 refills | Status: DC | PRN
  Filled 2024-02-25: qty 60, 30d supply, fill #0

## 2024-02-25 NOTE — Telephone Encounter (Signed)
 RF gabapentin  x 1 year

## 2024-02-25 NOTE — Progress Notes (Unsigned)
 [facility] Cancer Follow up:    Shannon Hollow, MD [address] [place] [address]   DIAGNOSIS: Cancer Staging  Malignant neoplasm of upper-outer quadrant of right breast in female, estrogen receptor positive (HCC) Staging form: Breast, AJCC 8th Edition - Clinical stage from 12/06/2021: Stage IA (cT1b, cN0, cM0, G2, ER+, PR+, HER2-) - Signed by Sonja Oberon, MD on 12/11/2021 Stage prefix: Initial diagnosis Histologic grading system: 3 grade system - Pathologic stage from 01/31/2022: Stage Unknown (pT1c, pNX, cM0, G2, ER+, PR+, HER2-) - Signed by Sonja [place], MD on 02/12/2022 Stage prefix: Initial diagnosis Histologic grading system: 3 grade system Residual tumor (R): R0    SUMMARY OF ONCOLOGIC HISTORY: Oncology History Overview Note   Cancer Staging  Malignant neoplasm of upper-outer quadrant of right breast in female, estrogen receptor positive (HCC) Staging form: Breast, AJCC 8th Edition - Clinical stage from 12/06/2021: Stage IA (cT1b, cN0, cM0, G2, ER+, PR+, HER2-) - Signed by Sonja [place], MD on 12/11/2021 - Pathologic stage from 01/31/2022: Stage Unknown (pT1c, pNX, cM0, G2, ER+, PR+, HER2-) - Signed by Sonja Edgemoor, MD on 02/12/2022     Malignant neoplasm of upper-outer quadrant of right breast in female, estrogen receptor positive (HCC)  11/28/2021 Mammogram   CLINICAL DATA:  Patient returns today to evaluate possible RIGHT breast asymmetries identified on recent screening mammogram.   EXAM: DIGITAL DIAGNOSTIC UNILATERAL RIGHT MAMMOGRAM WITH TOMOSYNTHESIS AND CAD; ULTRASOUND RIGHT BREAST LIMITED  IMPRESSION: 1. Irregular hypoechoic mass in the RIGHT breast at the 10 o'clock axis, 7 cm from the nipple, measuring 9 mm, with associated architectural distortion, corresponding to the mammographic finding. This is a highly suspicious finding for which ultrasound-guided core biopsy is recommended. 2. Additional benign findings within the RIGHT breast, as  detailed above. 3. No enlarged or morphologically abnormal lymph nodes in the RIGHT axilla.   12/06/2021 Cancer Staging   Staging form: Breast, AJCC 8th Edition - Clinical stage from 12/06/2021: Stage IA (cT1b, cN0, cM0, G2, ER+, PR+, HER2-) - Signed by Sonja , MD on 12/11/2021 Stage prefix: Initial diagnosis Histologic grading system: 3 grade system   12/06/2021 Initial Biopsy   Diagnosis Breast, right, needle core biopsy, 10 o'clock, 7cmfn INVASIVE DUCTAL CARCINOMA WITH LOBULAR FEATURES, GRADE 2 (3+2+1)  An E-cadherin immunohistochemical stains performed with adequate control. This stain is diffusely negative within the tumor. Histologically the tumor forms ducts and is present in cribriform sheets as well as focally present in cords and small solid nests. Given this histology it is felt the negative E-cadherin stain represents aberrant absence of expression within an otherwise ductal carcinoma. This lack of E-cadherin expression may explain the focal lobular features.  PROGNOSTIC INDICATORS Results: The tumor cells are EQUIVOCAL for Her2 (2+). Her2 by FISH will be performed and the results reported separately. Estrogen Receptor: 95%, POSITIVE, STRONG STAINING INTENSITY Progesterone Receptor: 80%, POSITIVE, STRONG STAINING INTENSITY Proliferation Marker Ki67: 2%   12/10/2021 Initial Diagnosis   Malignant neoplasm of upper-outer quadrant of right breast in female, estrogen receptor positive (HCC)    Genetic Testing   [facility] CancerNext-Expanded Panel was Negative. Of note, a variant of uncertain significance was detected in the BRCA2 gene (p.[id number]). Report date is 12/24/2021.  The CancerNext-Expanded gene panel offered by [facility] and includes sequencing, rearrangement, and RNA analysis for the following 77 genes: AIP, ALK, APC, ATM, AXIN2, BAP1, BARD1, BLM, BMPR1A, BRCA1, BRCA2, BRIP1, CDC73, CDH1, CDK4, CDKN1B, CDKN2A, CHEK2, CTNNA1, DICER1, FANCC, FH, FLCN, GALNT12,  KIF1B, LZTR1,  MAX, MEN1, MET, MLH1, MSH2, MSH3, MSH6, MUTYH, NBN, NF1, NF2, NTHL1, PALB2, PHOX2B, PMS2, POT1, PRKAR1A, PTCH1, PTEN, RAD51C, RAD51D, RB1, RECQL, RET, SDHA, SDHAF2, SDHB, SDHC, SDHD, SMAD4, SMARCA4, SMARCB1, SMARCE1, STK11, SUFU, TMEM127, TP53, TSC1, TSC2, VHL and XRCC2 (sequencing and deletion/duplication); EGFR, EGLN1, HOXB13, KIT, MITF, PDGFRA, POLD1, and POLE (sequencing only); EPCAM and GREM1 (deletion/duplication only).    01/31/2022 Cancer Staging   Staging form: Breast, AJCC 8th Edition - Pathologic stage from 01/31/2022: Stage Unknown (pT1c, pNX, cM0, G2, ER+, PR+, HER2-) - Signed by Sonja Rowe, MD on 02/12/2022 Stage prefix: Initial diagnosis Histologic grading system: 3 grade system Residual tumor (R): R0 - None   01/31/2022 Definitive Surgery   FINAL MICROSCOPIC DIAGNOSIS:   A. BREAST, RIGHT, LUMPECTOMY:  -  Invasive ductal carcinoma with lobular features, Nottingham grade 2 of 3, 1.5 cm  -  Ductal carcinoma in-situ, intermediate grade  -  Margins uninvolved by carcinoma (<0.1 cm; anterior margin)  -  Previous biopsy site changes present  -  Adenosis and fibrocystic changes  -  See oncology table and comment below   B. BREAST, RIGHT ADDITIONAL ANTERIOMEDIAL MARGIN, EXCISION:  -  Usual ductal hyperplasia  -  No residual carcinoma identified  -  See comment   C. BREAST, RIGHT ADDITIONAL ANTERIOPOSTERIOR MARGIN, EXCISION:  -  No residual carcinoma identified   D. BREAST, RIGHT ADDITIONAL LATEROINFERIOR MARGIN, EXCISION:  -  Focal residual ductal carcinoma in situ  -  Fibrocystic changes with metaplasia  -  Margins uninvolved by carcinoma (0.1 cm; inferior margin)  -  See comment    03/2022 -  Anti-estrogen oral therapy   Tamoxifen  x 5 years     CURRENT THERAPY: tamoxifen   INTERVAL HISTORY:  Shannon Liu 83 y.o. female returns for follow-up of her breast cancer currently on tamoxifen .  Her most recent mammogram occurred on November 11, 2023 demonstrating no mammographic  evidence of malignancy and breast density category B.  She stopped taking tamoxifen  3 months ago due to feeling like she was going through menopause.  She says she has felt better over the past 3 months than she has felt in a long time.  She also tried letrozole and anastrozole  previously and was unable to tolerate those as well.     Patient Active Problem List   Diagnosis Date Noted   Hypercoagulable state due to paroxysmal atrial fibrillation (HCC) 03/06/2023   PCP NOTES >>>>>> 07/17/2022   Obstructive sleep apnea syndrome 04/26/2022   Generalized anxiety disorder 04/26/2022   Chronic diastolic heart failure (HCC) 01/09/2022   Left ventricular dysfunction 12/31/2021   Pericardial effusion 12/31/2021   Mitral regurgitation 12/31/2021   Tricuspid regurgitation 12/31/2021   Annual physical exam 12/28/2021   Family history of prostate cancer 12/13/2021   Malignant neoplasm of upper-outer quadrant of right breast in female, estrogen receptor positive (HCC) 12/10/2021   Degenerative disc disease, cervical 03/20/2020   Chronic anticoagulation 07/04/2015   HOH (hard of hearing)    Hyperlipemia    PAF (paroxysmal atrial fibrillation) (HCC) 06/23/2015   HTN (hypertension)     is allergic to amlodipine  besylate, buspirone, fluoxetine, morphine and codeine, penicillins, and sertraline hcl.  MEDICAL HISTORY: Past Medical History:  Diagnosis Date   Anxiety    Arthritis    Cancer (HCC) 2023   right breast   Cataract    CHF (congestive heart failure) (HCC)    Dysrhythmia    A fib   Heart murmur  HOH (hard of hearing)    Hyperlipidemia    Hypertension    PAF (paroxysmal atrial fibrillation) (HCC)    a. dx on 06/2015 admission. spontaneously converted into NSR. placed on Eliquis    Sleep apnea    CPAP    SURGICAL HISTORY: Past Surgical History:  Procedure Laterality Date   ABDOMINAL HYSTERECTOMY     ATRIAL FIBRILLATION ABLATION N/A 09/10/2022   Procedure: ATRIAL FIBRILLATION  ABLATION;  Surgeon: Lei Pump, MD;  Location: [facility] INVASIVE CV LAB;  Service: Cardiovascular;  Laterality: N/A;   BREAST BIOPSY Right 12/06/2021   BREAST CYST EXCISION Left    BREAST LUMPECTOMY Right 01/31/2022   BREAST LUMPECTOMY WITH RADIOACTIVE SEED LOCALIZATION Right 01/31/2022   Procedure: RIGHT BREAST LUMPECTOMY WITH RADIOACTIVE SEED LOCALIZATION;  Surgeon: Sim Dryer, MD;  Location: [facility] OR;  Service: General;  Laterality: Right;   BREAST SURGERY Left    lumpectomy 35 yrs ago   CARDIOVERSION N/A 12/31/2021   Procedure: CARDIOVERSION;  Surgeon: Wendie Hamburg, MD;  Location: [facility] ENDOSCOPY;  Service: Cardiovascular;  Laterality: N/A;   DILATION AND CURETTAGE OF UTERUS     NECK SURGERY     discectomy with cadaver bone   SPINE SURGERY  Years ago   Neck surgery   TEE WITHOUT CARDIOVERSION N/A 12/31/2021   Procedure: TRANSESOPHAGEAL ECHOCARDIOGRAM (TEE);  Surgeon: Wendie Hamburg, MD;  Location: [facility] ENDOSCOPY;  Service: Cardiovascular;  Laterality: N/A;   TONSILLECTOMY     removed as a child   TUBAL LIGATION      SOCIAL HISTORY: Social History   Socioeconomic History   Marital status: Widowed    Spouse name: Not on file   Number of children: 2   Years of education: Not on file   Highest education level: 12th grade  Occupational History   Occupation: Retired- [place] [employer] office  Tobacco Use   Smoking status: Never   Smokeless tobacco: Never   Tobacco comments:    Never smoked 12/19/23  Vaping Use   Vaping status: Never Used  Substance and Sexual Activity   Alcohol  use: No    Alcohol /week: 0.0 standard drinks of alcohol    Drug use: No   Sexual activity: Not Currently  Other Topics Concern   Not on file  Social History Narrative   Lives [place], near [place].    Daughter currently lives with her.   Social Drivers of Corporate investment banker Strain: Low Risk  (08/27/2023)   Overall Financial Resource Strain  (CARDIA)    Difficulty of Paying Living Expenses: Not hard at all  Food Insecurity: No Food Insecurity (08/27/2023)   Hunger Vital Sign    Worried About Running Out of Food in the Last Year: Never true    Ran Out of Food in the Last Year: Never true  Transportation Needs: No Transportation Needs (08/27/2023)   PRAPARE - Administrator, Civil Service (Medical): No    Lack of Transportation (Non-Medical): No  Physical Activity: Insufficiently Active (08/27/2023)   Exercise Vital Sign    Days of Exercise per Week: 2 days    Minutes of Exercise per Session: 20 min  Stress: No Stress Concern Present (08/27/2023)   [employer] of Occupational Health - Occupational Stress Questionnaire    Feeling of Stress : Only a little  Recent Concern: Stress - Stress Concern Present (07/25/2023)   [employer] of Occupational Health - Occupational Stress Questionnaire    Feeling of Stress : To  some extent  Social Connections: Moderately Integrated (08/27/2023)   Social Connection and Isolation Panel [NHANES]    Frequency of Communication with Friends and Family: More than three times a week    Frequency of Social Gatherings with Friends and Family: Twice a week    Attends Religious Services: More than 4 times per year    Active Member of [employer] or Organizations: Yes    Attends Banker Meetings: More than 4 times per year    Marital Status: Widowed  Intimate Partner Violence: Not At Risk (08/29/2023)   Humiliation, Afraid, Rape, and Kick questionnaire    Fear of Current or Ex-Partner: No    Emotionally Abused: No    Physically Abused: No    Sexually Abused: No    FAMILY HISTORY: Family History  Problem Relation Age of Onset   Heart attack Mother 74   CVA Mother 11   Unexplained death Father 65   Hypertension Sister    Hypertension Brother    Colon cancer Maternal Grandfather    Prostate cancer Son 22   Breast cancer Neg Hx     Review of Systems   Constitutional:  Negative for appetite change, chills, fatigue, fever and unexpected weight change.  HENT:   Negative for hearing loss, lump/mass and trouble swallowing.   Eyes:  Negative for eye problems and icterus.  Respiratory:  Negative for chest tightness, cough and shortness of breath.   Cardiovascular:  Negative for chest pain, leg swelling and palpitations.  Gastrointestinal:  Negative for abdominal distention, abdominal pain, constipation, diarrhea, nausea and vomiting.  Endocrine: Negative for hot flashes.  Genitourinary:  Negative for difficulty urinating.   Musculoskeletal:  Negative for arthralgias.  Skin:  Negative for itching and rash.  Neurological:  Negative for dizziness, extremity weakness, headaches and numbness.  Hematological:  Negative for adenopathy. Does not bruise/bleed easily.  Psychiatric/Behavioral:  Negative for depression. The patient is not nervous/anxious.       PHYSICAL EXAMINATION    There were no vitals filed for this visit.  Physical Exam Constitutional:      General: She is not in acute distress.    Appearance: Normal appearance. She is not toxic-appearing.  HENT:     Head: Normocephalic and atraumatic.     Mouth/Throat:     Mouth: Mucous membranes are moist.     Pharynx: Oropharynx is clear. No oropharyngeal exudate or posterior oropharyngeal erythema.  Eyes:     General: No scleral icterus. Cardiovascular:     Rate and Rhythm: Normal rate and regular rhythm.     Pulses: Normal pulses.     Heart sounds: Normal heart sounds.  Pulmonary:     Effort: Pulmonary effort is normal.     Breath sounds: Normal breath sounds.  Chest:     Comments: Right breast status postlumpectomy, no sign of local recurrence left breast is benign. Abdominal:     General: Abdomen is flat. Bowel sounds are normal. There is no distension.     Palpations: Abdomen is soft.     Tenderness: There is no abdominal tenderness.  Musculoskeletal:        General: No  swelling.     Cervical back: Neck supple.  Lymphadenopathy:     Cervical: No cervical adenopathy.     Upper Body:     Right upper body: No supraclavicular or axillary adenopathy.     Left upper body: No supraclavicular or axillary adenopathy.  Skin:    General: Skin is warm  and dry.     Findings: No rash.  Neurological:     General: No focal deficit present.     Mental Status: She is alert.  Psychiatric:        Mood and Affect: Mood normal.        Behavior: Behavior normal.     LABORATORY DATA:  CBC    Component Value Date/Time   WBC 6.6 12/31/2023 1139   RBC 4.64 12/31/2023 1139   HGB 14.0 12/31/2023 1139   HGB 13.7 02/14/2023 0912   HGB 14.1 03/02/2021 1046   HCT 42.6 12/31/2023 1139   HCT 41.0 03/02/2021 1046   PLT 205.0 12/31/2023 1139   PLT 208 02/14/2023 0912   PLT 233 03/02/2021 1046   MCV 91.8 12/31/2023 1139   MCV 89 03/02/2021 1046   MCH 30.4 02/14/2023 0912   MCHC 32.9 12/31/2023 1139   RDW 14.2 12/31/2023 1139   RDW 13.0 03/02/2021 1046   LYMPHSABS 1.5 12/31/2023 1139   MONOABS 0.8 12/31/2023 1139   EOSABS 0.2 12/31/2023 1139   BASOSABS 0.1 12/31/2023 1139    CMP     Component Value Date/Time   NA 142 01/05/2024 0820   NA 144 06/13/2022 1057   K 4.1 01/05/2024 0820   CL 108 01/05/2024 0820   CO2 28 01/05/2024 0820   GLUCOSE 113 (H) 01/05/2024 0820   BUN 17 01/05/2024 0820   BUN 16 06/13/2022 1057   CREATININE 0.70 01/05/2024 0820   CREATININE 0.70 02/14/2023 0912   CALCIUM  9.1 01/05/2024 0820   PROT 5.8 (L) 01/05/2024 0820   ALBUMIN 4.0 01/05/2024 0820   AST 14 01/05/2024 0820   AST 19 02/14/2023 0912   ALT 10 01/05/2024 0820   ALT 13 02/14/2023 0912   ALKPHOS 72 01/05/2024 0820   BILITOT 0.6 01/05/2024 0820   BILITOT 0.9 02/14/2023 0912   GFRNONAA >60 02/14/2023 0912   GFRAA >60 06/14/2019 1059     ASSESSMENT and THERAPY PLAN:   No problem-specific Assessment & Plan notes found for this encounter.     All questions were  answered. The patient knows to call the clinic with any problems, questions or concerns. We can certainly see the patient much sooner if necessary.  Total encounter time:*** minutes*in face-to-face visit time, chart review, lab review, care coordination, order entry, and documentation of the encounter time.    Alwin Baars, NP 02/25/24 11:50 AM Medical Oncology and Hematology [facility] [address] [place], [place] [address] Tel. [phone]    Fax. [phone]  *Total Encounter Time as defined by the Centers for Medicare and Medicaid Services includes, in addition to the face-to-face time of a patient visit (documented in the note above) non-face-to-face time: obtaining and reviewing outside history, ordering and reviewing medications, tests or procedures, care coordination (communications with other health care professionals or caregivers) and documentation in the medical record.

## 2024-02-25 NOTE — Addendum Note (Signed)
 Addended by: Ana Woodroof D on: 02/25/2024 03:05 PM   Modules accepted: Orders

## 2024-02-25 NOTE — Telephone Encounter (Signed)
 Rx sent.

## 2024-02-26 ENCOUNTER — Other Ambulatory Visit: Payer: Self-pay | Admitting: *Deleted

## 2024-02-26 ENCOUNTER — Inpatient Hospital Stay: Attending: Adult Health

## 2024-02-26 ENCOUNTER — Other Ambulatory Visit: Payer: Medicare PPO

## 2024-02-26 ENCOUNTER — Inpatient Hospital Stay: Admitting: Adult Health

## 2024-02-26 ENCOUNTER — Ambulatory Visit: Payer: Medicare PPO | Admitting: Adult Health

## 2024-02-26 ENCOUNTER — Encounter: Payer: Self-pay | Admitting: Internal Medicine

## 2024-02-26 VITALS — BP 153/72 | HR 66 | Temp 97.7°F | Resp 16 | Wt 216.4 lb

## 2024-02-26 DIAGNOSIS — C50411 Malignant neoplasm of upper-outer quadrant of right female breast: Secondary | ICD-10-CM

## 2024-02-26 DIAGNOSIS — Z170 Estrogen receptor positive status [ER+]: Secondary | ICD-10-CM | POA: Insufficient documentation

## 2024-02-26 DIAGNOSIS — Z79810 Long term (current) use of selective estrogen receptor modulators (SERMs): Secondary | ICD-10-CM | POA: Diagnosis not present

## 2024-02-26 LAB — CMP (CANCER CENTER ONLY)
ALT: 9 U/L (ref 0–44)
AST: 14 U/L — ABNORMAL LOW (ref 15–41)
Albumin: 4 g/dL (ref 3.5–5.0)
Alkaline Phosphatase: 72 U/L (ref 38–126)
Anion gap: 5 (ref 5–15)
BUN: 25 mg/dL — ABNORMAL HIGH (ref 8–23)
CO2: 29 mmol/L (ref 22–32)
Calcium: 8.9 mg/dL (ref 8.9–10.3)
Chloride: 108 mmol/L (ref 98–111)
Creatinine: 0.74 mg/dL (ref 0.44–1.00)
GFR, Estimated: >60 mL/min (ref >60)
Glucose, Bld: 106 mg/dL — ABNORMAL HIGH (ref 70–99)
Potassium: 4.6 mmol/L (ref 3.5–5.1)
Sodium: 142 mmol/L (ref 135–145)
Total Bilirubin: 0.6 mg/dL (ref 0.0–1.2)
Total Protein: 6.2 g/dL — ABNORMAL LOW (ref 6.5–8.1)

## 2024-02-26 LAB — CBC WITH DIFFERENTIAL (CANCER CENTER ONLY)
Abs Immature Granulocytes: 0.04 10*3/uL (ref 0.00–0.07)
Basophils Absolute: 0.1 10*3/uL (ref 0.0–0.1)
Basophils Relative: 1 %
Eosinophils Absolute: 0.3 10*3/uL (ref 0.0–0.5)
Eosinophils Relative: 3 %
HCT: 40.2 % (ref 36.0–46.0)
Hemoglobin: 13.4 g/dL (ref 12.0–15.0)
Immature Granulocytes: 0 %
Lymphocytes Relative: 26 %
Lymphs Abs: 2.7 10*3/uL (ref 0.7–4.0)
MCH: 29.8 pg (ref 26.0–34.0)
MCHC: 33.3 g/dL (ref 30.0–36.0)
MCV: 89.5 fL (ref 80.0–100.0)
Monocytes Absolute: 1.3 10*3/uL — ABNORMAL HIGH (ref 0.1–1.0)
Monocytes Relative: 13 %
Neutro Abs: 5.9 10*3/uL (ref 1.7–7.7)
Neutrophils Relative %: 57 %
Platelet Count: 217 10*3/uL (ref 150–400)
RBC: 4.49 MIL/uL (ref 3.87–5.11)
RDW: 13.8 % (ref 11.5–15.5)
WBC Count: 10.2 10*3/uL (ref 4.0–10.5)
nRBC: 0 % (ref 0.0–0.2)

## 2024-02-27 ENCOUNTER — Encounter: Payer: Self-pay | Admitting: Adult Health

## 2024-02-27 NOTE — Assessment & Plan Note (Signed)
 Shannon Liu is an 83 year old woman with stage Ia right breast invasive ductal carcinoma, ER/PR positive diagnosed in April 2023 status postlumpectomy followed by adjuvant antiestrogen therapy with tamoxifen  that began in June.  Stage Ia breast cancer: She has no clinical or radiographic signs of breast cancer recurrence.  She has stopped taking antiestrogen therapy with tamoxifen  and tells me she was unable to tolerate letrozole or anastrozole  as well.  I reviewed with her that without taking antiestrogen therapies her breast cancer recurrence risk is increased.  She is considering whether to restart antiestrogen therapy and if it outweighs the benefits of improved quality of life.  She is recommended to continue her annual mammograms next due in January 2026. Health maintenance: Shannon Liu has a BMI of 34.  We discussed losing weight, healthy diet, and exercise.  I suggested consideration of [facility] or [facility] for assistance with diet and exercise since she is 67 and would likely feel better if she were able to lose some weight.  I also reviewed that women who exercise 150 to 300 minutes a week have a lower rate of breast cancer recurrence.  Next steps: - f/u in 6 months with Dr. Maryalice Smaller - continue annual mammograms next due 10/2024 -consider [facility] or [facility] for weight loss

## 2024-03-03 ENCOUNTER — Other Ambulatory Visit: Payer: Self-pay | Admitting: Cardiology

## 2024-03-03 ENCOUNTER — Other Ambulatory Visit (HOSPITAL_COMMUNITY): Payer: Self-pay

## 2024-03-03 ENCOUNTER — Other Ambulatory Visit: Payer: Self-pay | Admitting: Internal Medicine

## 2024-03-03 ENCOUNTER — Other Ambulatory Visit: Payer: Self-pay

## 2024-03-03 MED ORDER — APIXABAN 5 MG PO TABS
5.0000 mg | ORAL_TABLET | Freq: Two times a day (BID) | ORAL | 1 refills | Status: DC
  Filled 2024-03-03: qty 180, 90d supply, fill #0
  Filled 2024-05-26: qty 180, 90d supply, fill #1

## 2024-03-03 MED ORDER — AMLODIPINE BESYLATE 5 MG PO TABS
5.0000 mg | ORAL_TABLET | Freq: Every day | ORAL | 1 refills | Status: DC
  Filled 2024-03-03: qty 90, 90d supply, fill #0
  Filled 2024-05-26 (×2): qty 90, 90d supply, fill #1

## 2024-03-03 MED ORDER — ROSUVASTATIN CALCIUM 10 MG PO TABS
10.0000 mg | ORAL_TABLET | Freq: Every day | ORAL | 1 refills | Status: DC
  Filled 2024-03-03: qty 90, 90d supply, fill #0
  Filled 2024-05-26 (×2): qty 90, 90d supply, fill #1

## 2024-03-03 MED ORDER — FUROSEMIDE 20 MG PO TABS
20.0000 mg | ORAL_TABLET | Freq: Every day | ORAL | 0 refills | Status: DC
  Filled 2024-03-03: qty 90, 90d supply, fill #0

## 2024-03-03 NOTE — Telephone Encounter (Signed)
 Prescription refill request for Eliquis  received. Indication: PAF Last office visit: 12/19/23  C Fenton PA Scr: 0.74 on 02/26/24  Epic Age: 83 Weight: 96.3kg  Based on above findings Eliquis  5mg  twice daily is the appropriate dose.  Refill approved.

## 2024-03-10 ENCOUNTER — Encounter: Payer: Self-pay | Admitting: Internal Medicine

## 2024-03-10 ENCOUNTER — Ambulatory Visit: Admitting: Internal Medicine

## 2024-03-10 VITALS — BP 136/76 | HR 78 | Temp 97.7°F | Resp 16 | Ht 66.0 in | Wt 209.0 lb

## 2024-03-10 DIAGNOSIS — F411 Generalized anxiety disorder: Secondary | ICD-10-CM

## 2024-03-10 DIAGNOSIS — Z79899 Other long term (current) drug therapy: Secondary | ICD-10-CM | POA: Diagnosis not present

## 2024-03-10 DIAGNOSIS — M150 Primary generalized (osteo)arthritis: Secondary | ICD-10-CM

## 2024-03-10 DIAGNOSIS — R600 Localized edema: Secondary | ICD-10-CM

## 2024-03-10 MED ORDER — GABAPENTIN 100 MG PO CAPS: 100.0000 mg | ORAL_CAPSULE | Freq: Every day | ORAL | Status: DC

## 2024-03-10 MED ORDER — GLUCOSAMINE 500 MG PO CAPS: ORAL_CAPSULE | ORAL | Status: AC

## 2024-03-10 NOTE — Progress Notes (Unsigned)
 Subjective:    Patient ID: Shannon Liu, female    DOB: 07/30/1941, 83 y.o.   MRN: [id number]  DOS:  03/10/2024 Type of visit - description: Acute, swelling.  Went to see oncology, they noted weight gain, they were concerned about fluid retention. She admits to some leg swelling. Denies chest pain or difficulty breathing.  No palpitation.  No orthopnea.  Since then, has changed her diet, has lost weight, feeling much better. She  feels that part of the problem was gabapentin , so she reduced the dose  Wt Readings from Last 3 Encounters:  03/10/24 209 lb (94.8 kg)  02/26/24 216 lb 6.4 oz (98.2 kg)  12/31/23 214 lb 3.2 oz (97.2 kg)     Review of Systems See above   Past Medical History:  Diagnosis Date   Anxiety    Arthritis    Cancer (HCC) 2023   right breast   Cataract    CHF (congestive heart failure) (HCC)    Dysrhythmia    A fib   Heart murmur    HOH (hard of hearing)    Hyperlipidemia    Hypertension    PAF (paroxysmal atrial fibrillation) (HCC)    a. dx on 06/2015 admission. spontaneously converted into NSR. placed on Eliquis    Sleep apnea    CPAP    Past Surgical History:  Procedure Laterality Date   ABDOMINAL HYSTERECTOMY     ATRIAL FIBRILLATION ABLATION N/A 09/10/2022   Procedure: ATRIAL FIBRILLATION ABLATION;  Surgeon: Lei Pump, MD;  Location: [facility] INVASIVE CV LAB;  Service: Cardiovascular;  Laterality: N/A;   BREAST BIOPSY Right 12/06/2021   BREAST CYST EXCISION Left    BREAST LUMPECTOMY Right 01/31/2022   BREAST LUMPECTOMY WITH RADIOACTIVE SEED LOCALIZATION Right 01/31/2022   Procedure: RIGHT BREAST LUMPECTOMY WITH RADIOACTIVE SEED LOCALIZATION;  Surgeon: Sim Dryer, MD;  Location: [facility] OR;  Service: General;  Laterality: Right;   BREAST SURGERY Left    lumpectomy 35 yrs ago   CARDIOVERSION N/A 12/31/2021   Procedure: CARDIOVERSION;  Surgeon: Wendie Hamburg, MD;  [facility] ENDOSCOPY;  Service: Cardiovascular;  Laterality:  N/A;   DILATION AND CURETTAGE OF UTERUS     NECK SURGERY     discectomy with cadaver bone   SPINE SURGERY  Years ago   Neck surgery   TEE WITHOUT CARDIOVERSION N/A 12/31/2021   Procedure: TRANSESOPHAGEAL ECHOCARDIOGRAM (TEE);  Surgeon: Wendie Hamburg, MD;  Location: [facility] ENDOSCOPY;  Service: Cardiovascular;  Laterality: N/A;   TONSILLECTOMY     removed as a child   TUBAL LIGATION      Current Outpatient Medications  Medication Instructions   acetaminophen  (TYLENOL ) 500 mg, As needed   ALPRAZolam  (XANAX ) 0.25 MG tablet Take 1 tablet (0.25 mg total) by mouth at bedtime as needed for anxiety.   amLODipine  (NORVASC ) 5 mg, Oral, Daily   diltiazem  (CARDIZEM ) 30 MG tablet Take 1 tablet every 4 hours AS NEEDED for AFIB heart rate >100   Eliquis  5 mg, Oral, 2 times daily   furosemide  (LASIX ) 20 mg, Oral, Daily   gabapentin  (NEURONTIN ) 100 mg, Oral, Daily   Glucosamine 500 MG CAPS OTC as needed   Lactobacillus (PROBIOTIC ACIDOPHILUS PO) 1 tablet, Daily   metoprolol  succinate (TOPROL -XL) 25 mg, Oral, Daily   potassium chloride  (KLOR-CON ) 10 MEQ tablet 10 mEq, Oral, 2 times daily   Propylene Glycol 0.6 % SOLN 1 drop, Daily at bedtime   rosuvastatin  (CRESTOR ) 10 mg, Oral, Daily at bedtime  sacubitril -valsartan  (ENTRESTO ) 49-51 MG 1 tablet, Oral, 2 times daily   tamoxifen  (NOLVADEX ) 10 mg, Oral, 2 times daily   Vitamin D  2,000 Units, Daily       Objective:   Physical Exam BP 136/76   Pulse 78   Temp 97.7 F (36.5 C) (Oral)   Resp 16   Ht 5\' 6"  (1.676 m)   Wt 209 lb (94.8 kg)   SpO2 97%   BMI 33.73 kg/m  General:   Well developed, NAD, BMI noted. HEENT:  Normocephalic . Face symmetric, atraumatic Neck: No JVD at 45 degrees Lungs:  CTA B Normal respiratory effort, no intercostal retractions, no accessory muscle use. Heart: RRR,  no murmur.  Lower extremities: Trace pretibial edema bilaterally  Skin: Not pale. Not jaundice Neurologic:  alert & oriented X3.  Speech  normal, gait appropriate for age and unassisted Psych--  Cognition and judgment appear intact.  Cooperative with normal attention span and concentration.  Behavior appropriate. No anxious or depressed appearing.      Assessment   SSESSMENT (new patient 07-2022, previous PCP Dr. Lysle Saunas retired) Prediabetes HTN High cholesterol CV: -- CHF  -- Atrial fibrillation dx ~ 2016 -Ablation 08-2022 OSA, on CPAP dx 2021 Anxiety (chronic xanax  prn) History of R breast cancer (+ lumpectomy April 2023, no chemo, no XRT) DJD  Dermatology: Dr. Del Favia  PLAN Edema: The patient reported leg swelling, weight gain, she thinks related to gabapentin . Subsequently, she started to eat healthier, watch her diet has lost weight. She also held gabapentin , now she is only taking 1 gabapentin  and still getting the benefits of it. Overall, problem has improved. Plan:  Continue healthy diet, low-salt, weigh herself her own scale daily and let me know if weight increases. Continue with a low dose of gabapentin  which apparently helps with pain without causing much swelling Okay to take occasionally extra Lasix . Back pain: See comments above, gabapentin  is helping a great deal w/ pain but causing swelling.  She compromise and is taking only 1 tablet with relatively good pain control. Would like to try glucosamine for MSK pain, no interactions noted.  Okay to proceed Anxiety, on Xanax .  UDS Thyroid  nodule: See last visit, thyromegaly prompted a US -- it showed a nodule, next ultrasound 1 year. RTC 3 to 4 months

## 2024-03-10 NOTE — Patient Instructions (Addendum)
 Continue with your healthy diet.  Watch your salt intake  Take Lasix  once a day . Once or  twice a week you can take a extra Lasix  if you have swelling  Continue with gabapentin  100 mg 1 tablet a day. From time to time take a extra gabapentin  if needed   Weight yourself daily, let me know if your weight is going up or if you have a lot of swelling.      GO TO THE LAB : Provide a urine sample  Next office visit for a checkup in 3 to 4 months Please make an appointment before you leave today

## 2024-03-11 NOTE — Assessment & Plan Note (Addendum)
 Edema: The patient reported leg swelling, weight gain, she thinks related to gabapentin . Subsequently, she started to eat healthier, watch her diet has lost weight. She also held gabapentin , now she is only taking 1 gabapentin  and still getting the benefits of it. Overall, problem has improved. Plan:  Continue healthy diet, low-salt, weigh herself her own scale daily and let me know if weight increases. Continue with a low dose of gabapentin  which apparently helps with pain without causing much swelling Okay to take occasionally extra Lasix . Back pain: See comments above, gabapentin  is helping a great deal w/ pain but causing swelling.  She compromise and is taking only 1 tablet with relatively good pain control. Would like to try glucosamine for MSK pain, no interactions noted.  Okay to proceed Anxiety, on Xanax .  UDS Thyroid  nodule: See last visit, thyromegaly prompted a US -- it showed a nodule, next ultrasound 1 year. RTC 3 to 4 months

## 2024-03-13 LAB — DM TEMPLATE

## 2024-03-13 LAB — DRUG MONITORING PANEL 375977 , URINE

## 2024-03-15 ENCOUNTER — Ambulatory Visit: Payer: Self-pay | Admitting: Internal Medicine

## 2024-03-26 ENCOUNTER — Other Ambulatory Visit (HOSPITAL_COMMUNITY): Payer: Self-pay

## 2024-03-26 DIAGNOSIS — M25562 Pain in left knee: Secondary | ICD-10-CM | POA: Diagnosis not present

## 2024-03-26 MED ORDER — METHYLPREDNISOLONE 4 MG PO TBPK
ORAL_TABLET | ORAL | 0 refills | Status: DC
  Filled 2024-03-26: qty 21, 6d supply, fill #0

## 2024-04-07 ENCOUNTER — Other Ambulatory Visit: Payer: Self-pay

## 2024-04-08 DIAGNOSIS — G4733 Obstructive sleep apnea (adult) (pediatric): Secondary | ICD-10-CM | POA: Diagnosis not present

## 2024-04-11 ENCOUNTER — Emergency Department (HOSPITAL_BASED_OUTPATIENT_CLINIC_OR_DEPARTMENT_OTHER)
Admission: EM | Admit: 2024-04-11 | Discharge: 2024-04-11 | Disposition: A | Discharge status: Home / Self Care | Attending: Emergency Medicine | Admitting: Emergency Medicine

## 2024-04-11 ENCOUNTER — Encounter (HOSPITAL_BASED_OUTPATIENT_CLINIC_OR_DEPARTMENT_OTHER): Payer: Self-pay

## 2024-04-11 ENCOUNTER — Other Ambulatory Visit: Payer: Self-pay

## 2024-04-11 ENCOUNTER — Emergency Department (HOSPITAL_BASED_OUTPATIENT_CLINIC_OR_DEPARTMENT_OTHER)

## 2024-04-11 DIAGNOSIS — W01198A Fall on same level from slipping, tripping and stumbling with subsequent striking against other object, initial encounter: Secondary | ICD-10-CM | POA: Diagnosis not present

## 2024-04-11 DIAGNOSIS — Z853 Personal history of malignant neoplasm of breast: Secondary | ICD-10-CM | POA: Diagnosis not present

## 2024-04-11 DIAGNOSIS — I5032 Chronic diastolic (congestive) heart failure: Secondary | ICD-10-CM | POA: Diagnosis not present

## 2024-04-11 DIAGNOSIS — Z7901 Long term (current) use of anticoagulants: Secondary | ICD-10-CM | POA: Insufficient documentation

## 2024-04-11 DIAGNOSIS — Z79899 Other long term (current) drug therapy: Secondary | ICD-10-CM | POA: Insufficient documentation

## 2024-04-11 DIAGNOSIS — I110 Hypertensive heart disease with heart failure: Secondary | ICD-10-CM | POA: Insufficient documentation

## 2024-04-11 DIAGNOSIS — H81399 Other peripheral vertigo, unspecified ear: Secondary | ICD-10-CM | POA: Insufficient documentation

## 2024-04-11 DIAGNOSIS — S0990XA Unspecified injury of head, initial encounter: Secondary | ICD-10-CM | POA: Diagnosis not present

## 2024-04-11 MED ORDER — MECLIZINE HCL 25 MG PO TABS
25.0000 mg | ORAL_TABLET | Freq: Once | ORAL | Status: AC
  Administered 2024-04-11: 25 mg via ORAL
  Filled 2024-04-11: qty 1

## 2024-04-11 MED ORDER — MECLIZINE HCL 12.5 MG PO TABS: 12.5000 mg | ORAL_TABLET | Freq: Three times a day (TID) | ORAL | 0 refills | Status: AC | PRN

## 2024-04-11 NOTE — ED Provider Notes (Addendum)
 [place] [facility] Provider Note  CSN: [id number] Arrival date & time: 04/11/24 1234  Chief Complaint(s) Fall  HPI Shannon Liu is a 83 y.o. female with past medical history as below, significant for A-fib on DOAC, CHF, vertigo who presents to the ED with complaint of slip and fall, head injury  Patient reports she fell approximate 2 weeks ago, slipped on wet spot on the floor.  She injured her left knee and hit her head on the floor.  No LOC.  She is on blood thinners.  This was approximately 2 weeks ago.  She has had no repeat head injury.  She seen orthopedics the day after the injury, knee has been improving.  She has been having some intermittent dizziness that feels similar to prior persistent vertigo, worse when she closes her eyes or turns her head.  Also having some nausea intermittently, vague headache.  No numbness or tingling, no hearing changes, no diplopia, no vomiting, no behavior changes  Past Medical History Past Medical History:  Diagnosis Date   Anxiety    Arthritis    Cancer (HCC) 2023   right breast   Cataract    CHF (congestive heart failure) (HCC)    Dysrhythmia    A fib   Heart murmur    HOH (hard of hearing)    Hyperlipidemia    Hypertension    PAF (paroxysmal atrial fibrillation) (HCC)    a. dx on 06/2015 admission. spontaneously converted into NSR. placed on Eliquis    Sleep apnea    CPAP   Patient Active Problem List   Diagnosis Date Noted   Hypercoagulable state due to paroxysmal atrial fibrillation (HCC) 03/06/2023   PCP NOTES >>>>>> 07/17/2022   Obstructive sleep apnea syndrome 04/26/2022   Generalized anxiety disorder 04/26/2022   Chronic diastolic heart failure (HCC) 01/09/2022   Left ventricular dysfunction 12/31/2021   Pericardial effusion 12/31/2021   Mitral regurgitation 12/31/2021   Tricuspid regurgitation 12/31/2021   Annual physical exam 12/28/2021   Family history of prostate cancer 12/13/2021    Malignant neoplasm of upper-outer quadrant of right breast in female, estrogen receptor positive (HCC) 12/10/2021   DJD (degenerative joint disease) 03/20/2020   Chronic anticoagulation 07/04/2015   HOH (hard of hearing)    Hyperlipemia    PAF (paroxysmal atrial fibrillation) (HCC) 06/23/2015   HTN (hypertension)    Home Medication(s) Prior to Admission medications   Medication Sig Start Date End Date Taking? Authorizing Provider  meclizine (ANTIVERT) 12.5 MG tablet Take 1 tablet (12.5 mg total) by mouth 3 (three) times daily as needed for dizziness. 04/11/24  Yes Elnor Jayson LABOR, DO  acetaminophen  (TYLENOL ) 500 MG tablet Take 500 mg by mouth as needed for mild pain.    [provider]  ALPRAZolam  (XANAX ) 0.25 MG tablet Take 1 tablet (0.25 mg total) by mouth at bedtime as needed for anxiety. 12/04/23   Amon Aloysius BRAVO, MD  amLODipine  (NORVASC ) 5 MG tablet Take 1 tablet (5 mg total) by mouth daily. 03/03/24   Amon Aloysius BRAVO, MD  apixaban  (ELIQUIS ) 5 MG TABS tablet Take 1 tablet (5 mg total) by mouth 2 (two) times daily. 03/03/24   Jeffrie Oneil BROCKS, MD  Cholecalciferol (VITAMIN D ) 50 MCG (2000 UT) CAPS Take 2,000 Units by mouth daily.    [provider]  diltiazem  (CARDIZEM ) 30 MG tablet Take 1 tablet every 4 hours AS NEEDED for AFIB heart rate >100 12/19/23   Fenton, Bristol R, PA  furosemide  (LASIX ) 20 MG tablet Take 1 tablet (20 mg total) by mouth daily. 03/03/24   Jeffrie Oneil BROCKS, MD  gabapentin  (NEURONTIN ) 100 MG capsule Take 1 capsule (100 mg total) by mouth daily. 03/10/24   Paz, Jose E, MD  Glucosamine 500 MG CAPS OTC as needed 03/10/24   Paz, Jose E, MD  Lactobacillus (PROBIOTIC ACIDOPHILUS PO) Take 1 tablet by mouth daily.    [provider]  methylPREDNISolone  (MEDROL ) 4 MG TBPK tablet Take as directed on package 03/26/24     metoprolol  succinate (TOPROL -XL) 25 MG 24 hr tablet Take 1 tablet (25 mg total) by mouth daily. 01/02/24   Fenton, Clint R, PA  potassium chloride   (KLOR-CON ) 10 MEQ tablet Take 1 tablet (10 mEq total) by mouth 2 (two) times daily. 12/05/23   Jeffrie Oneil BROCKS, MD  Propylene Glycol 0.6 % SOLN Place 1 drop into both eyes at bedtime. systane eye drops    [provider]  rosuvastatin  (CRESTOR ) 10 MG tablet Take 1 tablet (10 mg total) by mouth at bedtime. 03/03/24   Amon Aloysius BRAVO, MD  sacubitril -valsartan  (ENTRESTO ) 49-51 MG Take 1 tablet by mouth 2 (two) times daily. 06/03/23   Jeffrie Oneil BROCKS, MD  tamoxifen  (NOLVADEX ) 10 MG tablet Take 1 tablet (10 mg total) by mouth 2 (two) times daily. Patient not taking: Reported on 03/10/2024 09/04/23   Hanford [place] BRAVO, NP                                                                                                                                    Past Surgical History Past Surgical History:  Procedure Laterality Date   ABDOMINAL HYSTERECTOMY     ATRIAL FIBRILLATION ABLATION N/A 09/10/2022   Procedure: ATRIAL FIBRILLATION ABLATION;  Surgeon: Inocencio Soyla Lunger, MD;  Location: [facility] INVASIVE CV LAB;  Service: Cardiovascular;  Laterality: N/A;   BREAST BIOPSY Right 12/06/2021   BREAST CYST EXCISION Left    BREAST LUMPECTOMY Right 01/31/2022   BREAST LUMPECTOMY WITH RADIOACTIVE SEED LOCALIZATION Right 01/31/2022   Procedure: RIGHT BREAST LUMPECTOMY WITH RADIOACTIVE SEED LOCALIZATION;  Surgeon: Vanderbilt Ned, MD;  Location: [facility] OR;  Service: General;  Laterality: Right;   BREAST SURGERY Left    lumpectomy 35 yrs ago   CARDIOVERSION N/A 12/31/2021   Procedure: CARDIOVERSION;  Surgeon: Kate Lonni CROME, MD;  Location: [facility] ENDOSCOPY;  Service: Cardiovascular;  Laterality: N/A;   DILATION AND CURETTAGE OF UTERUS     NECK SURGERY     discectomy with cadaver bone   SPINE SURGERY  Years ago   Neck surgery   TEE WITHOUT CARDIOVERSION N/A 12/31/2021   Procedure: TRANSESOPHAGEAL ECHOCARDIOGRAM (TEE);  Surgeon: Kate Lonni CROME, MD;  Location: [facility] ENDOSCOPY;  Service: Cardiovascular;   Laterality: N/A;   TONSILLECTOMY     removed as a child   TUBAL LIGATION     Family History Family History  Problem Relation Age of  Onset   Heart attack Mother 60   CVA Mother 48   Unexplained death Father 24   Hypertension Sister    Hypertension Brother    Colon cancer Maternal Grandfather    Prostate cancer Son 59   Breast cancer Neg Hx     Social History Social History   Tobacco Use   Smoking status: Never   Smokeless tobacco: Never   Tobacco comments:    Never smoked 12/19/23  Vaping Use   Vaping status: Never Used  Substance Use Topics   Alcohol  use: No    Alcohol /week: 0.0 standard drinks of alcohol    Drug use: No   Allergies Amlodipine  besylate, Buspirone, Fluoxetine, Morphine and codeine, Penicillins, and Sertraline hcl  Review of Systems A thorough review of systems was obtained and all systems are negative except as noted in the HPI and PMH.   Physical Exam Vital Signs  I have reviewed the triage vital signs BP (!) 157/86   Pulse 73   Temp 97.8 F (36.6 C)   Resp 17   Ht 5' 7 (1.702 m)   Wt 94.3 kg   SpO2 97%   BMI 32.58 kg/m  Physical Exam Vitals and nursing note reviewed.  Constitutional:      General: She is not in acute distress.    Appearance: Normal appearance. She is obese.  HENT:     Head: Normocephalic and atraumatic.     Right Ear: External ear normal.     Left Ear: External ear normal.     Nose: Nose normal.     Mouth/Throat:     Mouth: Mucous membranes are moist.   Eyes:     General: No scleral icterus.       Right eye: No discharge.        Left eye: No discharge.     Extraocular Movements: Extraocular movements intact.     Pupils: Pupils are equal, round, and reactive to light.    Cardiovascular:     Rate and Rhythm: Normal rate.     Pulses: Normal pulses.  Pulmonary:     Effort: Pulmonary effort is normal. No respiratory distress.     Breath sounds: No stridor.  Abdominal:     General: Abdomen is flat. There is  no distension.     Palpations: Abdomen is soft.     Tenderness: There is no abdominal tenderness.   Musculoskeletal:     Cervical back: No rigidity.     Right lower leg: No edema.     Left lower leg: No edema.   Skin:    General: Skin is warm and dry.     Capillary Refill: Capillary refill takes less than 2 seconds.   Neurological:     Mental Status: She is alert and oriented to person, place, and time.     GCS: GCS eye subscore is 4. GCS verbal subscore is 5. GCS motor subscore is 6.     Cranial Nerves: Cranial nerves 2-12 are intact. No dysarthria or facial asymmetry.     Sensory: Sensation is intact. No sensory deficit.     Motor: Motor function is intact. No tremor or pronator drift.     Coordination: Coordination is intact. Coordination normal.     Gait: Gait is intact.     Comments: Strength 5/5 to BLUE/BLLE, equal and symmetric    Psychiatric:        Mood and Affect: Mood normal.        Behavior: Behavior  normal. Behavior is cooperative.     ED Results and Treatments Labs (all labs ordered are listed, but only abnormal results are displayed) Labs Reviewed - No data to display                                                                                                                        Radiology CT Head Wo Contrast Result Date: 04/11/2024 CLINICAL DATA:  Fall.  Blunt head trauma.  On anticoagulation. EXAM: CT HEAD WITHOUT CONTRAST TECHNIQUE: Contiguous axial images were obtained from the base of the skull through the vertex without intravenous contrast. RADIATION DOSE REDUCTION: This exam was performed according to the departmental dose-optimization program which includes automated exposure control, adjustment of the mA and/or kV according to patient size and/or use of iterative reconstruction technique. COMPARISON:  None Available. FINDINGS: Brain: No evidence of intracranial hemorrhage, acute infarction, hydrocephalus, extra-axial collection, or mass lesion/mass  effect. Vascular:  No hyperdense vessel or other acute findings. Skull: No evidence of fracture or other significant bone abnormality. Sinuses/Orbits:  No acute findings. Other: None. IMPRESSION: Negative noncontrast head CT. Electronically Signed   By: Norleen DELENA Kil M.D.   On: 04/11/2024 14:49    Pertinent labs & imaging results that were available during my care of the patient were reviewed by me and considered in my medical decision making (see MDM for details).  Medications Ordered in ED Medications  meclizine (ANTIVERT) tablet 25 mg (25 mg Oral Given 04/11/24 1416)                                                                                                                                     Procedures Procedures  (including critical care time)  Medical Decision Making / ED Course    Medical Decision Making:    PRIYANA MCCAREY is a 83 y.o. female with past medical history as below, significant for A-fib on DOAC, CHF, vertigo who presents to the ED with complaint of slip and fall, head injury. The complaint involves an extensive differential diagnosis and also carries with it a high risk of complications and morbidity.  Serious etiology was considered. Ddx includes but is not limited to: Differential diagnoses for head trauma includes subdural hematoma, epidural hematoma, acute concussion, traumatic subarachnoid hemorrhage, cerebral contusions, etc.   Complete initial physical exam performed, notably the patient was in no acute distress, neuro intact.  Reviewed and confirmed nursing documentation for past medical history, family history, social history.  Vital signs reviewed.     Brief summary:  83 year old female history as above including A-fib on DOAC presents to the ER with head injury 2 weeks ago Mechanical slip and fall, no syncope She is having some intermittent dizziness and nausea, headache Get CT head   Clinical Course as of 04/11/24 1502  Sun Apr 11, 2024  1455  CTH neg for acute abnormality  [SG]    Clinical Course User Index [SG] Elnor Savant A, DO     CT is unremarkable, she is feeling better.  Given concussion precautions.  advised follow-up with PCP  Concern for peripheral vertigo as well, prior hx of this, resolved w/ antivert today. Will give rx for antivert  Patient in no distress and overall condition is stable. Detailed discussions were had with the patient/guardian regarding current findings, and need for close f/u with PCP or on call doctor. The patient/guardian has been instructed to return immediately if the symptoms worsen in any way for re-evaluation. Patient/guardian verbalized understanding and is in agreement with current care plan. All questions answered prior to discharge.              Additional history obtained: -Additional history obtained from family -External records from outside source obtained and reviewed including: Chart review including previous notes, labs, imaging, consultation notes including  Primary care documentation, home medications   Lab Tests: na  EKG   EKG Interpretation Date/Time:  Sunday April 11 2024 13:16:28 EDT Ventricular Rate:  74 PR Interval:  165 QRS Duration:  79 QT Interval:  401 QTC Calculation: 445 R Axis:   44  Text Interpretation: Sinus rhythm Low voltage, precordial leads similar to prior no stemi Confirmed by Elnor Savant ([id number]) on 04/11/2024 2:03:33 PM         Imaging Studies ordered: I ordered imaging studies including CTH I independently visualized the following imaging with scope of interpretation limited to determining acute life threatening conditions related to emergency care; findings noted above I agree with the radiologist interpretation If any imaging was obtained with contrast I closely monitored patient for any possible adverse reaction a/w contrast administration in the emergency department   Medicines ordered and prescription drug  management: Meds ordered this encounter  Medications   meclizine (ANTIVERT) tablet 25 mg   meclizine (ANTIVERT) 12.5 MG tablet    Sig: Take 1 tablet (12.5 mg total) by mouth 3 (three) times daily as needed for dizziness.    Dispense:  15 tablet    Refill:  0    -I have reviewed the patients home medicines and have made adjustments as needed   Consultations Obtained: na   Cardiac Monitoring: Continuous pulse oximetry interpreted by myself, 97% on ra.    Social Determinants of Health:  Diagnosis or treatment significantly limited by social determinants of health: obesity   Reevaluation: After the interventions noted above, I reevaluated the patient and found that they have improved  Co morbidities that complicate the patient evaluation  Past Medical History:  Diagnosis Date   Anxiety    Arthritis    Cancer (HCC) 2023   right breast   Cataract    CHF (congestive heart failure) (HCC)    Dysrhythmia    A fib   Heart murmur    HOH (hard of hearing)    Hyperlipidemia    Hypertension    PAF (paroxysmal atrial fibrillation) (HCC)    a. dx  on 06/2015 admission. spontaneously converted into NSR. placed on Eliquis    Sleep apnea    CPAP      Dispostion: Disposition decision including need for hospitalization was considered, and patient discharged from emergency department.    Final Clinical Impression(s) / ED Diagnoses Final diagnoses:  Minor head injury, initial encounter  Peripheral vertigo, unspecified laterality        Elnor Jayson LABOR, DO 04/11/24 1456    Elnor Jayson LABOR, DO 04/11/24 1502

## 2024-04-11 NOTE — Discharge Instructions (Addendum)
 Based on the events which brought you to the ER today, it is possible that you may have a concussion. A concussion occurs when there is a blow to the head or body, with enough force to shake the brain and disrupt how the brain functions. You may experience symptoms such as headaches, sensitivity to light/noise, dizziness, cognitive slowing, difficulty concentrating / remembering, trouble sleeping and drowsiness. These symptoms may last anywhere from hours/days to potentially weeks/months. While these symptoms are very frustrating and perhaps debilitating, it is important that you remember that they will improve over time. Everyone has a different rate of recovery; it is difficult to predict when your symptoms will resolve. In order to allow for your brain to heal after the injury, we recommend that you see your primary physician or a physician knowledgeable in concussion management. We also advise you to let your body and brain rest: avoid physical activities (sports, gym, and exercise) and reduce cognitive demands (reading, texting, TV watching, computer use, video games, etc). School attendance, after-school activities and work may need to be modified to avoid increasing symptoms. We recommend against driving until until all symptoms have resolved. Come back to the ER right away if you are having repeated episodes of vomiting, severe/worsening headache/dizziness or any other symptom that alarms you. We recommended that someone stay with you for the next 24 hours to monitor for these worrisome symptoms.

## 2024-04-11 NOTE — ED Triage Notes (Signed)
 Ambulatory to triage with cane Pt reports falling due to slippery floor 2 weeks ago. Hurt left knee and hit left side of forehead. Pt did see ortho for left knee but did not mention hitting head. Pt on blood thinners for A Fib  Pt complains of dizziness. Hx of vertigo and feels dizziness is not any better. Denies  HA and knee pain has improved. Reports nausea

## 2024-04-11 NOTE — ED Notes (Signed)
 Patient transported to CT

## 2024-04-11 NOTE — ED Notes (Signed)
 ED Provider at bedside.

## 2024-04-27 DIAGNOSIS — M1712 Unilateral primary osteoarthritis, left knee: Secondary | ICD-10-CM | POA: Diagnosis not present

## 2024-04-28 ENCOUNTER — Ambulatory Visit: Admitting: Internal Medicine

## 2024-05-05 ENCOUNTER — Other Ambulatory Visit (HOSPITAL_COMMUNITY): Payer: Self-pay

## 2024-05-19 DIAGNOSIS — M1712 Unilateral primary osteoarthritis, left knee: Secondary | ICD-10-CM | POA: Diagnosis not present

## 2024-05-26 ENCOUNTER — Other Ambulatory Visit: Payer: Self-pay

## 2024-05-26 ENCOUNTER — Other Ambulatory Visit: Payer: Self-pay | Admitting: Cardiology

## 2024-05-26 ENCOUNTER — Other Ambulatory Visit (HOSPITAL_COMMUNITY): Payer: Self-pay | Admitting: Physician Assistant

## 2024-05-26 ENCOUNTER — Other Ambulatory Visit (HOSPITAL_COMMUNITY): Payer: Self-pay

## 2024-05-26 DIAGNOSIS — M1712 Unilateral primary osteoarthritis, left knee: Secondary | ICD-10-CM | POA: Diagnosis not present

## 2024-05-26 MED ORDER — METOPROLOL SUCCINATE ER 25 MG PO TB24
25.0000 mg | ORAL_TABLET | Freq: Every day | ORAL | 1 refills | Status: AC
  Filled 2024-05-26 – 2024-07-06 (×2): qty 90, 90d supply, fill #0
  Filled 2024-09-24: qty 90, 90d supply, fill #1

## 2024-05-28 ENCOUNTER — Other Ambulatory Visit: Payer: Self-pay

## 2024-05-28 ENCOUNTER — Other Ambulatory Visit (HOSPITAL_COMMUNITY): Payer: Self-pay

## 2024-05-28 MED ORDER — SACUBITRIL-VALSARTAN 49-51 MG PO TABS
1.0000 | ORAL_TABLET | Freq: Two times a day (BID) | ORAL | 0 refills | Status: DC
  Filled 2024-05-28: qty 60, 30d supply, fill #0

## 2024-05-28 MED ORDER — FUROSEMIDE 20 MG PO TABS
20.0000 mg | ORAL_TABLET | Freq: Every day | ORAL | 0 refills | Status: DC
  Filled 2024-05-28: qty 30, 30d supply, fill #0

## 2024-05-28 MED ORDER — POTASSIUM CHLORIDE ER 10 MEQ PO TBCR
10.0000 meq | EXTENDED_RELEASE_TABLET | Freq: Two times a day (BID) | ORAL | 0 refills | Status: DC
  Filled 2024-05-28: qty 60, 30d supply, fill #0

## 2024-06-02 DIAGNOSIS — M1712 Unilateral primary osteoarthritis, left knee: Secondary | ICD-10-CM | POA: Diagnosis not present

## 2024-06-16 ENCOUNTER — Other Ambulatory Visit (HOSPITAL_COMMUNITY): Payer: Self-pay

## 2024-06-17 ENCOUNTER — Encounter (HOSPITAL_BASED_OUTPATIENT_CLINIC_OR_DEPARTMENT_OTHER): Payer: Self-pay | Admitting: Cardiology

## 2024-06-17 ENCOUNTER — Other Ambulatory Visit (HOSPITAL_COMMUNITY): Payer: Self-pay

## 2024-06-17 ENCOUNTER — Ambulatory Visit (HOSPITAL_BASED_OUTPATIENT_CLINIC_OR_DEPARTMENT_OTHER): Admitting: Cardiology

## 2024-06-17 VITALS — BP 134/82 | HR 73 | Ht 67.0 in | Wt 206.8 lb

## 2024-06-17 DIAGNOSIS — D6869 Other thrombophilia: Secondary | ICD-10-CM

## 2024-06-17 DIAGNOSIS — I480 Paroxysmal atrial fibrillation: Secondary | ICD-10-CM

## 2024-06-17 DIAGNOSIS — Z8679 Personal history of other diseases of the circulatory system: Secondary | ICD-10-CM | POA: Diagnosis not present

## 2024-06-17 DIAGNOSIS — Z9889 Other specified postprocedural states: Secondary | ICD-10-CM | POA: Diagnosis not present

## 2024-06-17 DIAGNOSIS — I5022 Chronic systolic (congestive) heart failure: Secondary | ICD-10-CM

## 2024-06-17 MED ORDER — SACUBITRIL-VALSARTAN 49-51 MG PO TABS
1.0000 | ORAL_TABLET | Freq: Two times a day (BID) | ORAL | 3 refills | Status: AC
  Filled 2024-06-17 – 2024-06-22 (×2): qty 180, 90d supply, fill #0
  Filled 2024-09-24: qty 180, 90d supply, fill #1

## 2024-06-17 MED ORDER — POTASSIUM CHLORIDE ER 10 MEQ PO TBCR
10.0000 meq | EXTENDED_RELEASE_TABLET | Freq: Two times a day (BID) | ORAL | 3 refills | Status: AC
  Filled 2024-06-17 – 2024-06-22 (×2): qty 180, 90d supply, fill #0
  Filled 2024-09-24: qty 180, 90d supply, fill #1

## 2024-06-17 MED ORDER — FUROSEMIDE 20 MG PO TABS
20.0000 mg | ORAL_TABLET | Freq: Every day | ORAL | 3 refills | Status: AC
  Filled 2024-06-17 – 2024-06-22 (×2): qty 90, 90d supply, fill #0
  Filled 2024-09-24: qty 90, 90d supply, fill #1

## 2024-06-17 NOTE — Patient Instructions (Signed)

## 2024-06-17 NOTE — Progress Notes (Addendum)
 Cardiology Office Note:  .   Date:  06/17/2024  ID:  Shannon Liu, DOB 15-Nov-1940, MRN [id number] PCP: Amon Aloysius BRAVO, MD  [facility] Providers Cardiologist:  Oneil Parchment, MD Electrophysiologist:  Will Gladis Norton, MD     History of Present Illness: .   Shannon Liu is a 83 y.o. female Discussed the use of AI scribe software for clinical note transcription with the patient, who gave verbal consent to proceed.  History of Present Illness Shannon Liu is an 83 year old female with persistent atrial fibrillation who presents for follow-up.  She has been in sinus rhythm without symptoms of atrial fibrillation for over six months following cardioversion on December 31, 2021, and an ablation on September 10, 2022. She reports that her last EKG was normal. Previously, she was very symptomatic with atrial fibrillation but currently feels better than she has in years.  She is currently taking Eliquis  for anticoagulation, amlodipine  5 mg daily for hypertension, Toprol  12.5 mg daily, rosuvastatin  10 mg daily for hyperlipidemia, Entresto  49/51 mg twice a day, and diltiazem  30 mg as needed. She experiences occasional minor bruising from Eliquis  but no significant side effects or stomach issues. She wants to reduce her medication burden but acknowledges the importance of her current regimen.  She has a history of hypertension, hyperlipidemia, obstructive sleep apnea, and chronic systolic heart failure with an ejection fraction of 45-50%. She recalls that her echocardiogram on November 26, 2022, showed improvement and mildly reduced pump function. She questions the necessity of continuing Entresto .  She mentions a family history of stroke, as her mother died from one, which contributes to her concern about stopping Eliquis . She also reports past vertigo, which has improved recently, and a significant fall in June that took six weeks to recover from.  She inquires about joint pain management, noting  severe morning stiffness and difficulty walking. She is currently using a knee brace and taking glucosamine chondroitin without significant improvement. She experiences severe joint pain and stiffness, especially in the mornings.  She discusses her medication costs and insurance coverage, noting that she reached a deductible and currently has no out-of-pocket expenses for her medications until the end of the year.     Studies Reviewed: .        Results LABS LDL: 77  RADIOLOGY Coronary calcium  score: 300, 73rd percentile, presence of plaque (2023)  DIAGNOSTIC Echocardiogram: Ejection Fraction (EF) 45-50%, mildly reduced (11/26/2022) Risk Assessment/Calculations:            Physical Exam:   VS:  BP 134/82   Pulse 73   Ht 5' 7 (1.702 m)   Wt 206 lb 12.8 oz (93.8 kg)   SpO2 97%   BMI 32.39 kg/m    Wt Readings from Last 3 Encounters:  06/17/24 206 lb 12.8 oz (93.8 kg)  04/11/24 208 lb (94.3 kg)  03/10/24 209 lb (94.8 kg)    GEN: Well nourished, well developed in no acute distress NECK: No JVD; No carotid bruits CARDIAC: RRR, no murmurs, no rubs, no gallops RESPIRATORY:  Clear to auscultation without rales, wheezing or rhonchi  ABDOMEN: Soft, non-tender, non-distended EXTREMITIES:  No edema; No deformity   ASSESSMENT AND PLAN: .    Assessment and Plan Assessment & Plan Atrial fibrillation, status post ablation, currently in sinus rhythm Currently in sinus rhythm following previous ablation with no symptoms of atrial fibrillation in the last six months. Previous EKGs have shown normal rhythm. Due to the risk of  stroke if atrial fibrillation recurs, anticoagulation with Eliquis  will continue. Consideration of the Watchman device was discussed as an alternative to anticoagulation, especially if she desires to discontinue Eliquis  in the future. - Continue Eliquis  for anticoagulation - Consider ZIO monitor to check for atrial fibrillation recurrence - Discuss Watchman device  with EP if interested  Chronic systolic heart failure with mildly reduced ejection fraction Ejection fraction is mildly reduced at 45-50%, showing slight improvement from previous measurements. Currently on goal-directed medical therapy with Entresto , which has been beneficial for heart failure management and aids in managing shortness of breath, which she currently does not experience. - Continue Entresto  49/51 mg twice a day  Hypertension Hypertension is being managed with Amlodipine  and Toprol . Current treatment appears effective. - Continue Amlodipine  5 mg daily - Continue Toprol  12.5 mg daily  Hyperlipidemia Hyperlipidemia is being managed with rosuvastatin  (Crestor ) 10 mg daily. LDL is 77, close to the target of less than 70. Discussed the role of LPa as a marker for cardiovascular risk, but no immediate need to adjust current therapy. - Continue rosuvastatin  (Crestor ) 10 mg daily  Follow-Up Plan for follow-up visits to monitor conditions and adjust treatment as necessary. - Schedule follow-up in one year - Continue regular visits to the AFib clinic as well         Dispo: 1 yr  Signed, Oneil Parchment, MD

## 2024-06-19 ENCOUNTER — Encounter (HOSPITAL_BASED_OUTPATIENT_CLINIC_OR_DEPARTMENT_OTHER): Payer: Self-pay | Admitting: Cardiology

## 2024-06-22 ENCOUNTER — Other Ambulatory Visit (HOSPITAL_COMMUNITY): Payer: Self-pay

## 2024-06-22 ENCOUNTER — Other Ambulatory Visit: Payer: Self-pay

## 2024-07-06 ENCOUNTER — Other Ambulatory Visit: Payer: Self-pay

## 2024-07-09 DIAGNOSIS — G4733 Obstructive sleep apnea (adult) (pediatric): Secondary | ICD-10-CM | POA: Diagnosis not present

## 2024-07-14 ENCOUNTER — Encounter: Payer: Self-pay | Admitting: Internal Medicine

## 2024-07-14 ENCOUNTER — Ambulatory Visit: Admitting: Internal Medicine

## 2024-07-14 VITALS — BP 136/80 | HR 67 | Temp 98.3°F | Resp 18 | Ht 67.0 in | Wt 210.5 lb

## 2024-07-14 DIAGNOSIS — Z23 Encounter for immunization: Secondary | ICD-10-CM | POA: Diagnosis not present

## 2024-07-14 DIAGNOSIS — I480 Paroxysmal atrial fibrillation: Secondary | ICD-10-CM | POA: Diagnosis not present

## 2024-07-14 DIAGNOSIS — I10 Essential (primary) hypertension: Secondary | ICD-10-CM

## 2024-07-14 DIAGNOSIS — R198 Other specified symptoms and signs involving the digestive system and abdomen: Secondary | ICD-10-CM

## 2024-07-14 DIAGNOSIS — I5032 Chronic diastolic (congestive) heart failure: Secondary | ICD-10-CM

## 2024-07-14 DIAGNOSIS — G510 Bell's palsy: Secondary | ICD-10-CM

## 2024-07-14 HISTORY — DX: Bell's palsy: G51.0

## 2024-07-14 NOTE — Patient Instructions (Addendum)
 GO TO THE LAB :  Get the blood work    Then, go to the front desk for the checkout Please make an appointment for a physical exam 12/2024   STOP BY THE FIRST FLOOR:  get the XR   Continue with probiotics To capsule of Metamucil once daily with lots of fluids in the morning If your bowels do not feel better in the next couple of months let me know If you have severe stomach symptoms, blood in the stools: Call immediately or seek medical attention  Continue checking your blood pressure regularly Blood pressure goal:  between 110/65 and  135/85. If it is consistently higher or lower, let me know  You got a flu shot, proceed with a COVID booster  Please read more detailed instructions below   ABNORMAL BOWEL MOVEMENTS: You have been experiencing daily bowel movements followed by one or two additional loose stools in the morning. There are no signs of serious issues like blood in the stool or weight loss. -Add a Metamucil tablet every morning. -Call us  if your symptoms do not improve or if you notice blood in your stools or experience alternating constipation and diarrhea.  HEART FAILURE AND ATRIAL FIBRILLATION: Your atrial fibrillation and heart failure are well-controlled with your current medications. You have not experienced any chest pain, breathing difficulties, or leg swelling. -Continue your current medications for heart failure and atrial fibrillation.  HYPERTENSION: Your blood pressure is well-controlled both at home and during today's visit. -Continue your current blood pressure medications. -We will check a basic metabolic panel (BMP) to monitor your condition.

## 2024-07-14 NOTE — Progress Notes (Unsigned)
 Subjective:    Patient ID: Shannon Liu, female    DOB: [birth year], 83 y.o.   MRN: [id number]  DOS:  07/14/2024 Type of visit - description: Follow-up  Discussed the use of AI scribe software for clinical note transcription with the patient, who gave verbal consent to proceed. History of Present Illness  Altered bowel habits - Bowel movement every morning, followed by one or two additional loose, but not watery, bowel movements - No blood in stool - No nausea, vomiting, abdominal pain, fevers, or constipation - No unwanted weight loss - Symptoms do not persist during the day once morning routine is completed - Morning bowel pattern poses a challenge when she has morning plans  Cardiac symptoms and monitoring - History of atrial fibrillation and heart failure - No chest pain, dyspnea, or peripheral edema - Home blood pressure monitoring is reportedly well-controlled  Medication use - Current medications: Xanax  at night, Eliquis , rosuvastatin  10 mg, metoprolol , diltiazem , amlodipine , Entresto , Lasix , potassium - Takes a probiotic, which she finds beneficial    Review of Systems See above   Past Medical History:  Diagnosis Date   Anxiety    Arthritis    Cancer (HCC) 2023   right breast   Cataract    CHF (congestive heart failure) (HCC)    Dysrhythmia    A fib   Heart murmur    HOH (hard of hearing)    Hyperlipidemia    Hypertension    PAF (paroxysmal atrial fibrillation) (HCC)    a. dx on 06/2015 admission. spontaneously converted into NSR. placed on Eliquis    Sleep apnea    CPAP    Past Surgical History:  Procedure Laterality Date   ABDOMINAL HYSTERECTOMY     ATRIAL FIBRILLATION ABLATION N/A 09/10/2022   Procedure: ATRIAL FIBRILLATION ABLATION;  Surgeon: Inocencio Soyla Lunger, MD;  Location: [facility] INVASIVE CV LAB;  Service: Cardiovascular;  Laterality: N/A;   BREAST BIOPSY Right 12/06/2021   BREAST CYST EXCISION Left    BREAST LUMPECTOMY Right 01/31/2022    BREAST LUMPECTOMY WITH RADIOACTIVE SEED LOCALIZATION Right 01/31/2022   Procedure: RIGHT BREAST LUMPECTOMY WITH RADIOACTIVE SEED LOCALIZATION;  Surgeon: Vanderbilt Ned, MD;  Location: [facility] OR;  Service: General;  Laterality: Right;   BREAST SURGERY Left    lumpectomy 35 yrs ago   CARDIOVERSION N/A 12/31/2021   Procedure: CARDIOVERSION;  Surgeon: Kate Lonni CROME, MD;  Location: [facility] ENDOSCOPY;  Service: Cardiovascular;  Laterality: N/A;   DILATION AND CURETTAGE OF UTERUS     NECK SURGERY     discectomy with cadaver bone   SPINE SURGERY  Years ago   Neck surgery   TEE WITHOUT CARDIOVERSION N/A 12/31/2021   Procedure: TRANSESOPHAGEAL ECHOCARDIOGRAM (TEE);  Surgeon: Kate Lonni CROME, MD;  Location: [facility] ENDOSCOPY;  Service: Cardiovascular;  Laterality: N/A;   TONSILLECTOMY     removed as a child   TUBAL LIGATION      Current Outpatient Medications  Medication Instructions   acetaminophen  (TYLENOL ) 500 mg, As needed   ALPRAZolam  (XANAX ) 0.25 MG tablet Take 1 tablet (0.25 mg total) by mouth at bedtime as needed for anxiety.   amLODipine  (NORVASC ) 5 mg, Oral, Daily   diltiazem  (CARDIZEM ) 30 MG tablet Take 1 tablet every 4 hours AS NEEDED for AFIB heart rate >100   Eliquis  5 mg, Oral, 2 times daily   furosemide  (LASIX ) 20 mg, Oral, Daily, Pt will call when ready to have filled   Glucosamine 500 MG CAPS OTC as needed  Lactobacillus (PROBIOTIC ACIDOPHILUS PO) 1 tablet, Daily   meclizine  (ANTIVERT ) 12.5 mg, Oral, 3 times daily PRN   metoprolol  succinate (TOPROL -XL) 25 mg, Oral, Daily   potassium chloride  (KLOR-CON ) 10 MEQ tablet 10 mEq, Oral, 2 times daily, Please keep scheduled appointment for future refills. Thank you.   Propylene Glycol 0.6 % SOLN 1 drop, Daily at bedtime   rosuvastatin  (CRESTOR ) 10 mg, Oral, Daily at bedtime   sacubitril -valsartan  (ENTRESTO ) 49-51 MG 1 tablet, Oral, 2 times daily   Vitamin D  2,000 Units, Daily       Objective:   Physical Exam BP 136/80    Pulse 67   Temp 98.3 F (36.8 C) (Oral)   Resp 18   Ht 5' 7 (1.702 m)   Wt 210 lb 8 oz (95.5 kg)   SpO2 96%   BMI 32.97 kg/m  General:   Well developed, NAD, BMI noted.  HEENT:  Normocephalic . Face symmetric, atraumatic Lungs:  CTA B Normal respiratory effort, no intercostal retractions, no accessory muscle use. Heart: RRR,  no murmur.  Abdomen:  Not distended, soft, non-tender. No rebound or rigidity.   Skin: Not pale. Not jaundice Lower extremities: no pretibial edema bilaterally  Neurologic:  alert & oriented X3.  Speech normal, gait appropriate for age and unassisted Psych--  Cognition and judgment appear intact.  Cooperative with normal attention span and concentration.  Behavior appropriate. No anxious or depressed appearing.     Assessment    ASSESSMENT (new patient 07-2022, previous PCP Dr. Norleen Lewis retired) Prediabetes HTN High cholesterol CV: -- CHF  -- Atrial fibrillation dx ~ 2016 -Ablation 08-2022 OSA, on CPAP dx 2021 Anxiety (chronic xanax  prn) History of R breast cancer (+ lumpectomy April 2023, no chemo, no XRT) DJD  Dermatology: Dr. Shona Thyroid  nodule, next US  12/2024  Assessment & Plan Abnormal bowel movements Reports daily bowel movements followed by one or two additional loose stools.  See HPI.  No red flag symptoms present. - Add a Metamucil tablet every morning, check CBC to rule out anemia. - Instruct to call if symptoms do not improve or if blood in stools or intermittent constipation and diarrhea occur.  Heart failure and atrial fibrillation Cardiology visit June 17, 2024, was in sinus rhythm, continue Eliquis .  They were considering Zio monitor to check for intermittent A-fib.For CHF was recommended to continue Entresto  Hypertension Blood pressure is well-controlled at home and during the visit. On a regimen including Amlodipine  Cardizem  Lasix  metoprolol  potassium Entresto  no change, check a BMP Preventive care: Flu shot  today, recommend a COVID booster RTC 12/2024 CPX

## 2024-07-15 ENCOUNTER — Ambulatory Visit: Payer: Self-pay | Admitting: Internal Medicine

## 2024-07-15 LAB — CBC WITH DIFFERENTIAL/PLATELET
Basophils Absolute: 0.1 K/uL (ref 0.0–0.1)
Basophils Relative: 1.3 % (ref 0.0–3.0)
Eosinophils Absolute: 0.2 K/uL (ref 0.0–0.7)
Eosinophils Relative: 2.9 % (ref 0.0–5.0)
HCT: 42.7 % (ref 36.0–46.0)
Hemoglobin: 14 g/dL (ref 12.0–15.0)
Lymphocytes Relative: 28.3 % (ref 12.0–46.0)
Lymphs Abs: 1.7 K/uL (ref 0.7–4.0)
MCHC: 32.8 g/dL (ref 30.0–36.0)
MCV: 90.6 fl (ref 78.0–100.0)
Monocytes Absolute: 0.7 K/uL (ref 0.1–1.0)
Monocytes Relative: 12.3 % — ABNORMAL HIGH (ref 3.0–12.0)
Neutro Abs: 3.3 K/uL (ref 1.4–7.7)
Neutrophils Relative %: 55.2 % (ref 43.0–77.0)
Platelets: 211 K/uL (ref 150.0–400.0)
RBC: 4.72 Mil/uL (ref 3.87–5.11)
RDW: 13.6 % (ref 11.5–15.5)
WBC: 6.1 K/uL (ref 4.0–10.5)

## 2024-07-15 LAB — BASIC METABOLIC PANEL WITH GFR
BUN: 15 mg/dL (ref 6–23)
CO2: 30 meq/L (ref 19–32)
Calcium: 9.6 mg/dL (ref 8.4–10.5)
Chloride: 103 meq/L (ref 96–112)
Creatinine, Ser: 0.67 mg/dL (ref 0.40–1.20)
GFR: 81.12 mL/min (ref >60.00)
Glucose, Bld: 107 mg/dL — ABNORMAL HIGH (ref 70–99)
Potassium: 4.3 meq/L (ref 3.5–5.1)
Sodium: 142 meq/L (ref 135–145)

## 2024-07-15 NOTE — Assessment & Plan Note (Signed)
 Abnormal bowel movements New issue Reports daily bowel movements followed by one or two additional loose stools.  See HPI.  No red flag symptoms present. - Add a Metamucil tablet every morning, check CBC to rule out anemia. - Instruct to call if symptoms do not improve or if blood in stools or intermittent constipation and diarrhea occur. Heart failure and atrial fibrillation Cardiology visit June 17, 2024, was in sinus rhythm, continue Eliquis .  They were considering Zio monitor to check for intermittent A-fib. For CHF was recommended to continue Entresto  Hypertension Blood pressure is well-controlled at home and during the visit. On a regimen including Amlodipine  Cardizem  Lasix  metoprolol  potassium Entresto . No change, check a BMP Preventive care: Flu shot today, recommend a COVID booster RTC 12/2024 CPX

## 2024-07-25 ENCOUNTER — Other Ambulatory Visit (HOSPITAL_COMMUNITY): Payer: Self-pay

## 2024-07-25 ENCOUNTER — Emergency Department (HOSPITAL_COMMUNITY)

## 2024-07-25 ENCOUNTER — Other Ambulatory Visit: Payer: Self-pay

## 2024-07-25 ENCOUNTER — Emergency Department (HOSPITAL_COMMUNITY)
Admission: EM | Admit: 2024-07-25 | Discharge: 2024-07-25 | Disposition: A | Discharge status: Home / Self Care | Attending: Emergency Medicine | Admitting: Emergency Medicine

## 2024-07-25 ENCOUNTER — Encounter (HOSPITAL_COMMUNITY): Payer: Self-pay

## 2024-07-25 DIAGNOSIS — I4891 Unspecified atrial fibrillation: Secondary | ICD-10-CM | POA: Insufficient documentation

## 2024-07-25 DIAGNOSIS — I110 Hypertensive heart disease with heart failure: Secondary | ICD-10-CM | POA: Diagnosis not present

## 2024-07-25 DIAGNOSIS — R29810 Facial weakness: Secondary | ICD-10-CM | POA: Diagnosis not present

## 2024-07-25 DIAGNOSIS — Z7901 Long term (current) use of anticoagulants: Secondary | ICD-10-CM | POA: Diagnosis not present

## 2024-07-25 DIAGNOSIS — G510 Bell's palsy: Secondary | ICD-10-CM | POA: Insufficient documentation

## 2024-07-25 DIAGNOSIS — I509 Heart failure, unspecified: Secondary | ICD-10-CM | POA: Diagnosis not present

## 2024-07-25 DIAGNOSIS — Z79899 Other long term (current) drug therapy: Secondary | ICD-10-CM | POA: Diagnosis not present

## 2024-07-25 DIAGNOSIS — I10 Essential (primary) hypertension: Secondary | ICD-10-CM | POA: Diagnosis not present

## 2024-07-25 DIAGNOSIS — R29818 Other symptoms and signs involving the nervous system: Secondary | ICD-10-CM | POA: Diagnosis not present

## 2024-07-25 LAB — COMPREHENSIVE METABOLIC PANEL WITH GFR
ALT: 14 U/L (ref 0–44)
AST: 24 U/L (ref 15–41)
Albumin: 3.8 g/dL (ref 3.5–5.0)
Alkaline Phosphatase: 93 U/L (ref 38–126)
Anion gap: 18 — ABNORMAL HIGH (ref 5–15)
BUN: 15 mg/dL (ref 8–23)
CO2: 22 mmol/L (ref 22–32)
Calcium: 9.2 mg/dL (ref 8.9–10.3)
Chloride: 102 mmol/L (ref 98–111)
Creatinine, Ser: 0.73 mg/dL (ref 0.44–1.00)
GFR, Estimated: >60 mL/min (ref >60)
Glucose, Bld: 145 mg/dL — ABNORMAL HIGH (ref 70–99)
Potassium: 3.7 mmol/L (ref 3.5–5.1)
Sodium: 142 mmol/L (ref 135–145)
Total Bilirubin: 1.1 mg/dL (ref 0.0–1.2)
Total Protein: 6.4 g/dL — ABNORMAL LOW (ref 6.5–8.1)

## 2024-07-25 LAB — DIFFERENTIAL
Abs Immature Granulocytes: 0.01 K/uL (ref 0.00–0.07)
Basophils Absolute: 0.1 K/uL (ref 0.0–0.1)
Basophils Relative: 1 %
Eosinophils Absolute: 0.2 K/uL (ref 0.0–0.5)
Eosinophils Relative: 3 %
Immature Granulocytes: 0 %
Lymphocytes Relative: 30 %
Lymphs Abs: 1.7 K/uL (ref 0.7–4.0)
Monocytes Absolute: 0.7 K/uL (ref 0.1–1.0)
Monocytes Relative: 12 %
Neutro Abs: 3.2 K/uL (ref 1.7–7.7)
Neutrophils Relative %: 54 %

## 2024-07-25 LAB — PROTIME-INR
INR: 1.3 — ABNORMAL HIGH (ref 0.8–1.2)
Prothrombin Time: 16.7 s — ABNORMAL HIGH (ref 11.4–15.2)

## 2024-07-25 LAB — CBC
HCT: 43.5 % (ref 36.0–46.0)
Hemoglobin: 14.5 g/dL (ref 12.0–15.0)
MCH: 30 pg (ref 26.0–34.0)
MCHC: 33.3 g/dL (ref 30.0–36.0)
MCV: 90.1 fL (ref 80.0–100.0)
Platelets: 222 K/uL (ref 150–400)
RBC: 4.83 MIL/uL (ref 3.87–5.11)
RDW: 13 % (ref 11.5–15.5)
WBC: 5.8 K/uL (ref 4.0–10.5)
nRBC: 0 % (ref 0.0–0.2)

## 2024-07-25 LAB — APTT: aPTT: 32 s (ref 24–36)

## 2024-07-25 LAB — ETHANOL: Alcohol, Ethyl (B): <15 mg/dL (ref <15)

## 2024-07-25 MED ORDER — PREDNISONE 10 MG PO TABS
60.0000 mg | ORAL_TABLET | Freq: Every day | ORAL | 0 refills | Status: DC
  Filled 2024-07-25: qty 42, 7d supply, fill #0

## 2024-07-25 MED ORDER — VISTA GONIO DRY EYE RELIEF 2.5 % OP SOLN
1.0000 [drp] | Freq: Four times a day (QID) | OPHTHALMIC | 12 refills | Status: AC | PRN
  Filled 2024-07-25: qty 15, 75d supply, fill #0

## 2024-07-25 MED ORDER — SODIUM CHLORIDE 0.9% FLUSH: 3.0000 mL | Freq: Once | INTRAVENOUS | Status: DC

## 2024-07-25 MED ORDER — PREDNISONE 20 MG PO TABS
60.0000 mg | ORAL_TABLET | Freq: Every day | ORAL | 0 refills | Status: AC
Start: 2024-07-25 — End: 2024-08-01
  Filled 2024-07-25 (×2): qty 21, 7d supply, fill #0

## 2024-07-25 NOTE — ED Provider Notes (Signed)
 [place] [facility] DEPARTMENT AT [facility] Provider Note   CSN: [id number] Arrival date & time: 07/25/24  1106     Patient presents with: Facial Droop   Shannon Liu is a 83 y.o. female.   Patient with history of CHF, hypertension, hyperlipidemia, A-fib on Eliquis  presents today with complaints of facial droop.  She reports that same began when she woke up yesterday morning.  Reports she looked in the mirror and noticed that the right side of her face was drooping. Denies and trauma. Denies any history of similar.  Does report she had a headache several days ago, denies any currently or since then.  Denies any changes in her vision.  No unilateral weakness or numbness/tingling.  Does report that she has family history of Bell's palsy and wonders if she has this as well.  Denies any fevers or chills, no recent illness or tick bites.  The history is provided by the patient. No language interpreter was used.       Prior to Admission medications   Medication Sig Start Date End Date Taking? Authorizing Provider  acetaminophen  (TYLENOL ) 500 MG tablet Take 500 mg by mouth as needed for mild pain.    [provider]  ALPRAZolam  (XANAX ) 0.25 MG tablet Take 1 tablet (0.25 mg total) by mouth at bedtime as needed for anxiety. 12/04/23   Amon Aloysius BRAVO, MD  amLODipine  (NORVASC ) 5 MG tablet Take 1 tablet (5 mg total) by mouth daily. 03/03/24   Amon Aloysius BRAVO, MD  apixaban  (ELIQUIS ) 5 MG TABS tablet Take 1 tablet (5 mg total) by mouth 2 (two) times daily. 03/03/24   Jeffrie Oneil BROCKS, MD  Cholecalciferol (VITAMIN D ) 50 MCG (2000 UT) CAPS Take 2,000 Units by mouth daily.    [provider]  diltiazem  (CARDIZEM ) 30 MG tablet Take 1 tablet every 4 hours AS NEEDED for AFIB heart rate >100 12/19/23   Fenton, Clint R, PA  furosemide  (LASIX ) 20 MG tablet Take 1 tablet (20 mg total) by mouth daily. Pt will call when ready to have filled 06/17/24   Jeffrie Oneil BROCKS, MD  Glucosamine 500 MG CAPS  OTC as needed 03/10/24   Paz, Jose E, MD  Lactobacillus (PROBIOTIC ACIDOPHILUS PO) Take 1 tablet by mouth daily.    [provider]  meclizine  (ANTIVERT ) 12.5 MG tablet Take 1 tablet (12.5 mg total) by mouth 3 (three) times daily as needed for dizziness. 04/11/24   Elnor Jayson LABOR, DO  metoprolol  succinate (TOPROL -XL) 25 MG 24 hr tablet Take 1 tablet (25 mg total) by mouth daily. 05/26/24   Fenton, Clint R, PA  potassium chloride  (KLOR-CON ) 10 MEQ tablet Take 1 tablet (10 mEq total) by mouth 2 (two) times daily. Please keep scheduled appointment for future refills. Thank you. 06/17/24   Jeffrie Oneil BROCKS, MD  Propylene Glycol 0.6 % SOLN Place 1 drop into both eyes at bedtime. systane eye drops    [provider]  rosuvastatin  (CRESTOR ) 10 MG tablet Take 1 tablet (10 mg total) by mouth at bedtime. 03/03/24   Amon Aloysius BRAVO, MD  sacubitril -valsartan  (ENTRESTO ) 49-51 MG Take 1 tablet by mouth 2 (two) times daily. 06/17/24   Jeffrie Oneil BROCKS, MD    Allergies: Amlodipine  besylate, Buspirone, Fluoxetine, Morphine and codeine, Penicillins, and Sertraline hcl    Review of Systems  Neurological:  Positive for facial asymmetry.  All other systems reviewed and are negative.   Updated Vital Signs BP (!) 162/81  Pulse 65   Temp 98.8 F (37.1 C)   Resp 14   Ht 5' 7 (1.702 m)   Wt 93.4 kg   SpO2 97%   BMI 32.26 kg/m   Physical Exam Vitals and nursing note reviewed.  Constitutional:      General: She is not in acute distress.    Appearance: Normal appearance. She is normal weight. She is not ill-appearing, toxic-appearing or diaphoretic.  HENT:     Head: Normocephalic and atraumatic.  Eyes:     Extraocular Movements: Extraocular movements intact.     Pupils: Pupils are equal, round, and reactive to light.  Cardiovascular:     Rate and Rhythm: Normal rate.  Pulmonary:     Effort: Pulmonary effort is normal. No respiratory distress.  Musculoskeletal:        General: Normal range of  motion.     Cervical back: Normal range of motion.  Skin:    General: Skin is warm and dry.  Neurological:     General: No focal deficit present.     Mental Status: She is alert and oriented to person, place, and time.     GCS: GCS eye subscore is 4. GCS verbal subscore is 5. GCS motor subscore is 6.     Sensory: Sensation is intact.     Motor: Motor function is intact.     Coordination: Coordination is intact.     Gait: Gait is intact.     Comments: Alert and oriented to self, place, time and event.    Speech is fluent, clear without dysarthria or dysphasia.    Strength 5/5 in upper/lower extremities   Sensation intact in upper/lower extremities    CN I not tested  CN II grossly intact visual fields bilaterally. Did not visualize posterior eye.  CN III, IV, VI PERRLA and EOMs intact bilaterally  CN V Intact sensation to sharp and light touch to the face  CN VII right side facial droop appears to encompass the entire right face including the forehead and eyebrow. Does have some movement of the right face but less than the left. R eyelid droop compared to left, is able to completely close both eyes.  No facial numbness CN VIII not tested  CN IX, X no uvula deviation, symmetric rise of soft palate  CN XI 5/5 SCM and trapezius strength bilaterally  CN XII Midline tongue protrusion, symmetric L/R movements   Psychiatric:        Mood and Affect: Mood normal.        Behavior: Behavior normal.     (all labs ordered are listed, but only abnormal results are displayed) Labs Reviewed  PROTIME-INR - Abnormal; Notable for the following components:      Result Value   Prothrombin Time 16.7 (*)    INR 1.3 (*)    All other components within normal limits  COMPREHENSIVE METABOLIC PANEL WITH GFR - Abnormal; Notable for the following components:   Glucose, Bld 145 (*)    Total Protein 6.4 (*)    Anion gap 18 (*)    All other components within normal limits  APTT  CBC  DIFFERENTIAL   ETHANOL  CBG MONITORING, ED    EKG: None  Radiology: CT HEAD WO CONTRAST Result Date: 07/25/2024 EXAM: CT HEAD WITHOUT CONTRAST 07/25/2024 11:48:29 AM TECHNIQUE: CT of the head was performed without the administration of intravenous contrast. Automated exposure control, iterative reconstruction, and/or weight based adjustment of the mA/kV was utilized to reduce  the radiation dose to as low as reasonably achievable. COMPARISON: Head CT 04/11/2024. CLINICAL HISTORY: 83 year old female with acute neuro deficit, stroke suspected, and right-sided facial droop since yesterday 9 AM. FINDINGS: BRAIN AND VENTRICLES: No acute hemorrhage. No evidence of acute infarct. No hydrocephalus. No extra-axial collection. No mass effect or midline shift. Cerebral volume is stable, normal for age. Gray white differentiation remains normal for age. No suspicious intracranial vascular hyperdensity. Chronic calcified atherosclerosis at the skull base. ORBITS: No acute abnormality. SINUSES: No acute abnormality. SOFT TISSUES AND SKULL: No acute soft tissue abnormality. No skull fracture. IMPRESSION: 1. Stable and normal for age noncontrast CT appearance of the brain. Electronically signed by: Helayne Hurst MD 07/25/2024 11:55 AM EDT RP Workstation: [id number]     Procedures   Medications Ordered in the ED  sodium chloride  flush (NS) 0.9 % injection 3 mL (has no administration in time range)                                    Medical Decision Making  This patient is a 83 y.o. female who presents to the ED for concern of right sided facial droop, this involves an extensive number of treatment options, and is a complaint that carries with it a high risk of complications and morbidity. The emergent differential diagnosis prior to evaluation includes, but is not limited to,  CVA, neoplasm, ICH, Bell's palsy . This is not an exhaustive differential.   Past Medical History / Co-morbidities / Social History:  has a past  medical history of Anxiety, Arthritis, Cancer (HCC) (2023), Cataract, CHF (congestive heart failure) (HCC), Dysrhythmia, Heart murmur, HOH (hard of hearing), Hyperlipidemia, Hypertension, PAF (paroxysmal atrial fibrillation) (HCC), and Sleep apnea.  Additional history: Chart reviewed.  Physical Exam: Physical exam performed. The pertinent findings include: right side facial droop appears to encompass the entire right face including the forehead and eyebrow. Does have some movement of the right face but less than the left. R eyelid droop compared to left, is able to completely close both eyes.  No facial numbness. No other neurologic deficits  Lab Tests: I ordered, and personally interpreted labs.  The pertinent results include:  anion gap 18, no other acute laboratory abnormalities   Imaging Studies: I ordered imaging studies including CT head. I independently visualized and interpreted imaging which showed no acute findings. I agree with the radiologist interpretation.   Cardiac Monitoring:  The patient was maintained on a cardiac monitor.  My attending physician viewed and interpreted the cardiac monitored which showed an underlying rhythm of: Sinus rhythm, no STEMI. I agree with this interpretation.   Disposition: After consideration of the diagnostic results and the patients response to treatment, I feel that emergency department workup does not suggest an emergent condition requiring admission or immediate intervention beyond what has been performed at this time. The plan is: discharge with steroids, artificial tears, and outpatient neurology follow-up with return precautions. Patients symptoms consistent with Bell's palsy, seen by my attending Dr. Zackowski who agrees. Work-up is benign. No indication for further evaluation with MRI at this time. Evaluation and diagnostic testing in the emergency department does not suggest an emergent condition requiring admission or immediate intervention  beyond what has been performed at this time.  Plan for discharge with close PCP follow-up.  Patient is understanding and amenable with plan, educated on red flag symptoms that would prompt immediate return.  Patient discharged  in stable condition.   This is a shared visit with supervising physician Dr. Zackowski who has independently evaluated patient & provided guidance in evaluation/management/disposition, in agreement with care   Final diagnoses:  Bell's palsy    ED Discharge Orders          Ordered    predniSONE  (DELTASONE ) 10 MG tablet  Daily        07/25/24 1225    hydroxypropyl methylcellulose / hypromellose (ISOPTO TEARS / GONIOVISC) 2.5 % ophthalmic solution  4 times daily PRN        07/25/24 1225          An After Visit Summary was printed and given to the patient.      Shannon Liu 07/25/24 1231    Geraldene Hamilton, MD 07/25/24 2044

## 2024-07-25 NOTE — ED Provider Notes (Signed)
 I provided a substantive portion of the care of this patient.  I personally made/approved the management plan for this patient and take responsibility for the patient management.     Patient seen by me along with physician assistant.  Patient yesterday started with some right facial droop in the morning.  Patient feels that it is starting to improve.  No speech changes no headache no upper extremity lower extremity weakness or numbness.  No visual changes but patient's had decreased vision in the right eye for a while now.  Exam is consistent with a Bell's palsy on the right side.  Affecting forehead face and lip patient able to close her eyes pretty tight.  And does have partial movement of the right side of the face.  But we can compared to the left.  Patient's INR is 1.3 CBC white count 5.8 hemoglobin 14.5 and platelets 222.  Complete metabolic panel pending.  CT head was ordered which is appropriate which is stable and normal for age.  Would recommend treatment for Bell's palsy liquid tears and follow-up with neurology.  This is not consistent with a CVA.   Shannon Labrake, MD 07/25/24 [phone]

## 2024-07-25 NOTE — Discharge Instructions (Addendum)
 As we discussed, your workup in the ER today was reassuring for acute findings.  Your symptoms are likely due to the condition known as Bell's palsy.  I have attached additional information to your paperwork regarding this diagnoses.  I have also written you a prescription for prednisone  which is a steroid you need to take as prescribed in its entirety every day for management of the symptoms.  I have also given you a prescription for eyedrops that you can use to keep your eye moist if you are unable to close your eye all the way.  You will need to see neurology for follow-up.  I have given you a referral with a number to call to schedule an appointment.  Please call at your earliest convenience.  Return if development of any new or worsening symptoms.

## 2024-07-25 NOTE — ED Provider Triage Note (Signed)
 Emergency Medicine Provider Triage Evaluation Note  Shannon Liu , Liu 83 y.o. female  was evaluated in triage.  Pt complains of right sided facial droop. Reports she first noticed this yesterday morning when she woke up. Denies history of similar. Does report she had Liu headache Liu few days ago but none since.   Review of Systems  Positive:  Negative:   Physical Exam  BP (!) 162/81   Pulse 65   Temp 98.8 F (37.1 C)   Resp 14   Ht 5' 7 (1.702 m)   Wt 93.4 kg   SpO2 97%   BMI 32.26 kg/m  Gen:   Awake, no distress   Resp:  Normal effort  MSK:   Moves extremities without difficulty  Other:  R side facial droop appears to encompass the entire right face including the forehead and eyebrow. R eyelid droop compared to left.  No numbness. 5/5 strength and sensation intact to bilateral upper and lower extremities Liu and O x 4  Medical Decision Making  Medically screening exam initiated at 11:23 AM.  Appropriate orders placed.  Shannon Liu was informed that the remainder of the evaluation will be completed by another provider, this initial triage assessment does not replace that evaluation, and the importance of remaining in the ED until their evaluation is complete.  Physical exam appears likely consistent with Bells palsy, seen by my attending Dr. Zackowski as well who agrees however feels it is reasonable to obtain CT head. Work-up initiated   Shannon Schirm A, PA-C 07/25/24 1158

## 2024-07-25 NOTE — ED Triage Notes (Signed)
 Pt reports right sided facial droop since yesterday around 9am. Pt has no other neuro deficits.

## 2024-07-26 ENCOUNTER — Encounter: Payer: Self-pay | Admitting: Neurology

## 2024-07-26 ENCOUNTER — Other Ambulatory Visit (HOSPITAL_COMMUNITY): Payer: Self-pay

## 2024-08-23 ENCOUNTER — Other Ambulatory Visit (HOSPITAL_COMMUNITY): Payer: Self-pay

## 2024-08-23 ENCOUNTER — Other Ambulatory Visit: Payer: Self-pay | Admitting: Internal Medicine

## 2024-08-23 ENCOUNTER — Other Ambulatory Visit: Payer: Self-pay | Admitting: Cardiology

## 2024-08-23 ENCOUNTER — Other Ambulatory Visit: Payer: Self-pay

## 2024-08-23 DIAGNOSIS — I480 Paroxysmal atrial fibrillation: Secondary | ICD-10-CM

## 2024-08-23 MED ORDER — ALPRAZOLAM 0.25 MG PO TABS
0.2500 mg | ORAL_TABLET | Freq: Every evening | ORAL | 2 refills | Status: AC | PRN
  Filled 2024-08-23: qty 30, 30d supply, fill #0

## 2024-08-23 MED ORDER — APIXABAN 5 MG PO TABS
5.0000 mg | ORAL_TABLET | Freq: Two times a day (BID) | ORAL | 1 refills | Status: AC
Start: 2024-08-23 — End: ?
  Filled 2024-08-23: qty 180, 90d supply, fill #0

## 2024-08-23 MED ORDER — ROSUVASTATIN CALCIUM 10 MG PO TABS
10.0000 mg | ORAL_TABLET | Freq: Every day | ORAL | 1 refills | Status: AC
  Filled 2024-08-23: qty 90, 90d supply, fill #0

## 2024-08-23 MED ORDER — AMLODIPINE BESYLATE 5 MG PO TABS
5.0000 mg | ORAL_TABLET | Freq: Every day | ORAL | 1 refills | Status: AC
  Filled 2024-08-23: qty 90, 90d supply, fill #0

## 2024-08-23 NOTE — Telephone Encounter (Signed)
 Requesting: alprazolam  0.25mg   Contract:03/26/23 UDS:03/10/24 Last Visit: 07/14/24 Next Visit: 01/19/25 Last Refill: 12/04/23 #30 and 0RF   Please Advise

## 2024-08-23 NOTE — Telephone Encounter (Signed)
 Prescription refill request for Eliquis  received. Indication: a fib Last office visit: 06/17/24 Scr: 0.73 epic 07/25/24 Age:  83 Weight: 93kg

## 2024-08-24 ENCOUNTER — Other Ambulatory Visit (HOSPITAL_COMMUNITY): Payer: Self-pay

## 2024-08-24 ENCOUNTER — Other Ambulatory Visit: Payer: Self-pay

## 2024-08-24 DIAGNOSIS — Z1379 Encounter for other screening for genetic and chromosomal anomalies: Secondary | ICD-10-CM | POA: Insufficient documentation

## 2024-08-24 NOTE — Progress Notes (Signed)
 UPDATE: the BRCA2 VUS has been reclassified to Likely Benign. Report date is 08/23/2024. Original test/ report date was [facility]-Expanded Panel (77 genes) reported on 12/24/2021.

## 2024-08-31 ENCOUNTER — Telehealth: Payer: Self-pay | Admitting: *Deleted

## 2024-08-31 ENCOUNTER — Ambulatory Visit (INDEPENDENT_AMBULATORY_CARE_PROVIDER_SITE_OTHER): Payer: Medicare PPO | Admitting: *Deleted

## 2024-08-31 VITALS — Ht 66.0 in | Wt 206.0 lb

## 2024-08-31 DIAGNOSIS — Z1231 Encounter for screening mammogram for malignant neoplasm of breast: Secondary | ICD-10-CM

## 2024-08-31 DIAGNOSIS — Z0000 Encounter for general adult medical examination without abnormal findings: Secondary | ICD-10-CM

## 2024-08-31 NOTE — Telephone Encounter (Signed)
 Notified pt of below. She will also discuss with PCP before proceeding with flu vaccine next year.

## 2024-08-31 NOTE — Progress Notes (Signed)
 Please attest this visit in the absence of patient primary care provider.    Chief Complaint  Patient presents with   Medicare Wellness     Subjective:   Shannon Liu is a 83 y.o. female who presents for a Medicare Annual Wellness Visit.  Allergies (verified) Amlodipine  besylate, Buspirone, Fluoxetine, Morphine and codeine, Penicillins, and Sertraline hcl   History: Past Medical History:  Diagnosis Date   Anxiety    Arthritis    Bell's palsy 07/2024   Cancer (HCC) 2023   right breast   Cataract    CHF (congestive heart failure) (HCC)    Dysrhythmia    A fib   Heart murmur    HOH (hard of hearing)    Hyperlipidemia    Hypertension    PAF (paroxysmal atrial fibrillation) (HCC)    a. dx on 06/2015 admission. spontaneously converted into NSR. placed on Eliquis    Sleep apnea    CPAP   Past Surgical History:  Procedure Laterality Date   ABDOMINAL HYSTERECTOMY     ATRIAL FIBRILLATION ABLATION N/A 09/10/2022   Procedure: ATRIAL FIBRILLATION ABLATION;  Surgeon: Inocencio Soyla Lunger, MD;  [facility];  Service: Cardiovascular;  Laterality: N/A;   BREAST BIOPSY Right 12/06/2021   BREAST CYST EXCISION Left    BREAST LUMPECTOMY Right 01/31/2022   BREAST LUMPECTOMY WITH RADIOACTIVE SEED LOCALIZATION Right 01/31/2022   Procedure: RIGHT BREAST LUMPECTOMY WITH RADIOACTIVE SEED LOCALIZATION;  Surgeon: Vanderbilt Ned, MD;  Location: [facility] OR;  Service: General;  Laterality: Right;   BREAST SURGERY Left    lumpectomy 35 yrs ago   CARDIOVERSION N/A 12/31/2021   Procedure: CARDIOVERSION;  Surgeon: Kate Lonni CROME, MD;  Location: [facility] ENDOSCOPY;  Service: Cardiovascular;  Laterality: N/A;   DILATION AND CURETTAGE OF UTERUS     NECK SURGERY     discectomy with cadaver bone   SPINE SURGERY  Years ago   Neck surgery   TEE WITHOUT CARDIOVERSION N/A 12/31/2021   Procedure: TRANSESOPHAGEAL ECHOCARDIOGRAM (TEE);  Surgeon: Kate Lonni CROME, MD;  Location: [facility]  ENDOSCOPY;  Service: Cardiovascular;  Laterality: N/A;   TONSILLECTOMY     removed as a child   TUBAL LIGATION     Family History  Problem Relation Age of Onset   Heart attack Mother 23   CVA Mother 50   Unexplained death Father 69   Hypertension Sister    Hypertension Brother    Colon cancer Maternal Grandfather    Prostate cancer Son 80   Breast cancer Neg Hx    Social History   Occupational History   Occupation: Retired- Teacher, 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  Tobacco Use   Smoking status: Never   Smokeless tobacco: Never   Tobacco comments:    Never smoked 12/19/23  Vaping Use   Vaping status: Never Used  Substance and Sexual Activity   Alcohol  use: No    Alcohol /week: 0.0 standard drinks of alcohol    Drug use: No   Sexual activity: Not Currently   Tobacco Counseling Counseling given: Not Answered Tobacco comments: Never smoked 12/19/23  SDOH Screenings   Food Insecurity: No Food Insecurity (08/31/2024)  Housing: Low Risk  (08/31/2024)  Transportation Needs: No Transportation Needs (08/31/2024)  Utilities: Not At Risk (08/31/2024)  Alcohol  Screen: Low Risk  (08/27/2023)  Depression (PHQ2-9): Low Risk  (08/31/2024)  Financial Resource Strain: Low Risk  (07/13/2024)  Physical Activity: Insufficiently Active (08/31/2024)  Social Connections: Moderately Integrated (08/31/2024)  Stress: No Stress Concern Present (08/31/2024)  Tobacco Use: Low Risk  (08/31/2024)  Health Literacy: Adequate Health Literacy (08/29/2023)   See flowsheets for full screening details  Depression Screen PHQ 2 & 9 Depression Scale- Over the past 2 weeks, how often have you been bothered by any of the following problems? Little interest or pleasure in doing things: 0 Feeling down, depressed, or hopeless (PHQ Adolescent also includes...irritable): 0 PHQ-2 Total Score: 0 Trouble falling or staying asleep, or sleeping too much: 1 (takes xanax  if she can't fall asleep) Feeling tired or having little  energy: 0 Poor appetite or overeating (PHQ Adolescent also includes...weight loss): 0 Feeling bad about yourself - or that you are a failure or have let yourself or your family down: 0 Trouble concentrating on things, such as reading the newspaper or watching television (PHQ Adolescent also includes...like school work): 0 Moving or speaking so slowly that other people could have noticed. Or the opposite - being so fidgety or restless that you have been moving around a lot more than usual: 0 Thoughts that you would be better off dead, or of hurting yourself in some way: 0 PHQ-9 Total Score: 1 If you checked off any problems, how difficult have these problems made it for you to do your work, take care of things at home, or get along with other people?: Not difficult at all     Goals Addressed             This Visit's Progress    Patient Stated   On track    Maintain health and activity        Visit info / Clinical Intake: Medicare Wellness Visit Type:: Subsequent Annual Wellness Visit Persons participating in visit:: patient Medicare Wellness Visit Mode:: Telephone If telephone:: video declined If Telephone or Video please confirm:: I connected with the patient using audio enabled telemedicine application and verified that I am speaking with the correct person using two identifiers; I discussed the limitations of evaluation and management by telemedicine; The patient expressed understanding and agreed to proceed Patient Location:: home Provider Location:: office Information given by:: patient Interpreter Needed?: No Pre-visit prep was completed: yes AWV questionnaire completed by patient prior to visit?: no Living arrangements:: with family/others (lives daughter) Patient's Overall Health Status Rating: good Typical amount of pain: some (has bad knee and daily pain, uses voltaren and injections) Does pain affect daily life?: no (pushes throught it) Are you currently prescribed  opioids?: no  Dietary Habits and Nutritional Risks How many meals a day?: 2 (Eats Breakfast and dinner) Eats fruit and vegetables daily?: yes Most meals are obtained by: preparing own meals; eating out In the last 2 weeks, have you had any of the following?: none Diabetic:: no  Functional Status Activities of Daily Living (to include ambulation/medication): Independent Ambulation: Independent with device- listed below Home Assistive Devices/Equipment: [place] (uses only if she falls, has intermittent vertigo) Medication Administration: Independent Home Management: Independent Manage your own finances?: yes Primary transportation is: driving Concerns about vision?: no *vision screening is required for WTM* (up to date Dr McCuen at [facility]) Concerns about hearing?: (!) yes Uses hearing aids?: (!) yes  Fall Screening Falls in the past year?: 1 (slipped on slick floor) Number of falls in past year: 0 Was there an injury with Fall?: 0 Fall Risk Category Calculator: 1 Patient Fall Risk Level: Low Fall Risk  Fall Risk Patient at Risk for Falls Due to: Impaired balance/gait; Impaired vision Fall risk Follow up: Falls evaluation completed  Home and  Transportation Safety: All rugs have non-skid backing?: yes All stairs or steps have railings?: yes Grab bars in the bathtub or shower?: yes Have non-skid surface in bathtub or shower?: (!) no Good home lighting?: yes Regular seat belt use?: yes Hospital stays in the last year:: no  Cognitive Assessment Difficulty concentrating, remembering, or making decisions? : yes (uses calender to remember appts) Will 6CIT or Mini Cog be Completed: yes What year is it?: 0 points What month is it?: 0 points Give patient an address phrase to remember (5 components): [address] [place]  About what time is it?: 0 points Count backwards from 20 to 1: 0 points Say the months of the year in reverse: 0 points Repeat the address  phrase from earlier: 0 points 6 CIT Score: 0 points  Advance Directives (For Healthcare) Does Patient Have a Medical Advance Directive?: Yes Does patient want to make changes to medical advance directive?: No - Patient declined Type of Advance Directive: Out of facility DNR (pink MOST or yellow form) Out of facility DNR (pink MOST or yellow form) in Chart? (Ambulatory ONLY): Yes - validated most recent copy scanned in chart  Reviewed/Updated  Reviewed/Updated: Reviewed All (Medical, Surgical, Family, Medications, Allergies, Care Teams, Patient Goals)        Objective:    Today's Vitals   08/31/24 1100  Weight: 206 lb (93.4 kg)  Height: 5' 6 (1.676 m)   Body mass index is 33.25 kg/m.  Current Medications (verified) Outpatient Encounter Medications as of 08/31/2024  Medication Sig   acetaminophen  (TYLENOL ) 500 MG tablet Take 500 mg by mouth as needed for mild pain.   ALPRAZolam  (XANAX ) 0.25 MG tablet Take 1 tablet (0.25 mg total) by mouth at bedtime as needed for anxiety.   amLODipine  (NORVASC ) 5 MG tablet Take 1 tablet (5 mg total) by mouth daily.   apixaban  (ELIQUIS ) 5 MG TABS tablet Take 1 tablet (5 mg total) by mouth 2 (two) times daily.   Cholecalciferol (VITAMIN D ) 50 MCG (2000 UT) CAPS Take 2,000 Units by mouth daily.   diltiazem  (CARDIZEM ) 30 MG tablet Take 1 tablet every 4 hours AS NEEDED for AFIB heart rate >100   furosemide  (LASIX ) 20 MG tablet Take 1 tablet (20 mg total) by mouth daily. Pt will call when ready to have filled   Glucosamine 500 MG CAPS OTC as needed   Hypromellose (VISTA GONIO DRY EYE RELIEF) 2.5 % SOLN Place 1 drop into the right eye 4 (four) times daily as needed.   Lactobacillus (PROBIOTIC ACIDOPHILUS PO) Take 1 tablet by mouth daily.   meclizine  (ANTIVERT ) 12.5 MG tablet Take 1 tablet (12.5 mg total) by mouth 3 (three) times daily as needed for dizziness.   metoprolol  succinate (TOPROL -XL) 25 MG 24 hr tablet Take 1 tablet (25 mg total) by mouth  daily.   potassium chloride  (KLOR-CON ) 10 MEQ tablet Take 1 tablet (10 mEq total) by mouth 2 (two) times daily. Please keep scheduled appointment for future refills. Thank you.   Propylene Glycol 0.6 % SOLN Place 1 drop into both eyes at bedtime. systane eye drops   rosuvastatin  (CRESTOR ) 10 MG tablet Take 1 tablet (10 mg total) by mouth at bedtime.   sacubitril -valsartan  (ENTRESTO ) 49-51 MG Take 1 tablet by mouth 2 (two) times daily.   No facility-administered encounter medications on file as of 08/31/2024.   Hearing/Vision screen No results found. Immunizations and Health Maintenance Health Maintenance  Topic Date Due   COVID-19 Vaccine (5 - 2025-26  season) 08/31/2025 (Originally 06/14/2024)   DTaP/Tdap/Td (3 - Td or Tdap) 11/08/2024   Mammogram  11/10/2024   Medicare Annual Wellness (AWV)  08/31/2025   Pneumococcal Vaccine: 50+ Years  Completed   Influenza Vaccine  Completed   DEXA SCAN  Completed   Zoster Vaccines- Shingrix  Completed   Meningococcal B Vaccine  Aged Out        Assessment/Plan:  This is a routine wellness examination for Shannon Liu.  Patient Care Team: Amon Aloysius BRAVO, MD as PCP - General (Internal Medicine) Jeffrie Oneil BROCKS, MD as PCP - Cardiology (Cardiology) Inocencio Soyla Lunger, MD as PCP - Electrophysiology (Cardiology) Vanderbilt Ned, MD as Consulting Physician (General Surgery) Lanny Callander, MD as Consulting Physician (Hematology) Dewey Rush, MD as Consulting Physician (Radiation Oncology) Saintclair Jasper, MD as Consulting Physician (Gastroenterology) Leslee Reusing, MD as Consulting Physician (Ophthalmology)  I have personally reviewed and noted the following in the patient's chart:   Medical and social history Use of alcohol , tobacco or illicit drugs  Current medications and supplements including opioid prescriptions. Functional ability and status Nutritional status Physical activity Advanced directives List of other physicians Hospitalizations,  surgeries, and ER visits in previous 12 months Vitals Screenings to include cognitive, depression, and falls Referrals and appointments  Orders Placed This Encounter  Procedures   MM 3D SCREENING MAMMOGRAM BILATERAL BREAST    Standing Status:   Future    Expected Date:   08/31/2024    Expiration Date:   08/31/2025    Reason for Exam (SYMPTOM  OR DIAGNOSIS REQUIRED):   breast cancer screening    Preferred imaging location?:   [facility]   In addition, I have reviewed and discussed with patient certain preventive protocols, quality metrics, and best practice recommendations. A written personalized care plan for preventive services as well as general preventive health recommendations were provided to patient.   Lolita Libra, CMA   08/31/2024   Return in 1 year (on 08/31/2025).  After Visit Summary: (MyChart) Due to this being a telephonic visit, the after visit summary with patients personalized plan was offered to patient via MyChart   Nurse Notes: see phone note

## 2024-08-31 NOTE — Telephone Encounter (Signed)
 Pt had AWV today. Reports that she received a flu vaccine at [facility] on 10/1 and was taken to the ER on 10/12 with Bell's Palsy.  Pt reports previous flu vaccines without side effects and is wondering if the Bell's Palsy could have been caused by the flu vaccine this year?

## 2024-08-31 NOTE — Patient Instructions (Addendum)
 [name],  Thank you for taking the time for your Medicare Wellness Visit. I appreciate your continued commitment to your health goals. Please review the care plan we discussed, and feel free to reach out if I can assist you further.  Please note that Annual Wellness Visits do not include a physical exam. Some assessments may be limited, especially if the visit was conducted virtually. If needed, we may recommend an in-person follow-up with your provider.  Goals: Maintain health and activity  Ongoing Care Seeing your primary care provider every 3 to 6 months helps us  monitor your health and provide consistent, personalized care.   Dr Amon:  01/19/25 1:20pm Annual Wellness Visit:  09/01/25 1pm, telephone  Referrals If a referral was made during today's visit and you haven't received any updates within two weeks, please contact the referred provider directly to check on the status.  Mammogram due after 11/10/24 ([facility]):  [phone]  Recommended Screenings:  Health Maintenance  Topic Date Due   COVID-19 Vaccine (5 - 2025-26 season) 06/14/2024   Medicare Annual Wellness Visit  08/28/2024   DTaP/Tdap/Td vaccine (3 - Td or Tdap) 11/08/2024   Breast Cancer Screening  11/10/2024   Pneumococcal Vaccine for age over 76  Completed   Flu Shot  Completed   DEXA scan (bone density measurement)  Completed   Zoster (Shingles) Vaccine  Completed   Meningitis B Vaccine  Aged Out       08/31/2024   11:04 AM  Advanced Directives  Does Patient Have a Medical Advance Directive? Yes  Type of Advance Directive Out of facility DNR (pink MOST or yellow form)  Does patient want to make changes to medical advance directive? No - Patient declined    Vision: Annual vision screenings are recommended for early detection of glaucoma, cataracts, and diabetic retinopathy. These exams can also reveal signs of chronic conditions such as diabetes and high blood pressure.  Dental: Annual dental  screenings help detect early signs of oral cancer, gum disease, and other conditions linked to overall health, including heart disease and diabetes.  Please see the attached documents for additional preventive care recommendations.

## 2024-09-06 ENCOUNTER — Ambulatory Visit: Admitting: Neurology

## 2024-09-06 ENCOUNTER — Encounter: Payer: Self-pay | Admitting: Neurology

## 2024-09-06 VITALS — BP 153/87 | HR 73 | Ht 66.0 in | Wt 212.0 lb

## 2024-09-06 DIAGNOSIS — G510 Bell's palsy: Secondary | ICD-10-CM

## 2024-09-06 NOTE — Progress Notes (Signed)
 [facility] Clinic Note - Initial Visit   Date: 09/06/2024   Shannon Liu MRN: [id number] DOB: 08-Sep-1941   Dear Dr. Amon:  Thank you for your kind referral of Shannon Liu for consultation of right Bell's palsy. Although her history is well known to you, please allow us  to reiterate it for the purpose of our medical record. The patient was accompanied to the clinic by self.   Shannon Liu is a 83 y.o. right-handed female with PAF, CHF, hypertension, hyperlipidemia, history of right breast cancer, and s/p cervical surgery presenting for evaluation of right Bell's palsy.   IMPRESSION/PLAN: Assessment & Plan Bell's palsy, right side.  Significant improvement with prednisone , mild residual weakness. Complete recovery may take up to a year. Flu shot association deemed coincidental. - Monitor for further improvement. - Reassured flu shot unlikely cause.  Return to clinic as needed  ------------------------------------------------------------- History of present illness Discussed the use of AI scribe software for clinical note transcription with the patient, who gave verbal consent to proceed.  History of Present Illness She experienced a sudden onset of right-sided facial weakness, beginning with a 'funny feeling' in her right cheek a few days before her emergency department visit in mid-October. The symptoms progressed to include drooling, heaviness, and watering of the right eye. Concerned about a possible stroke, she sought medical attention with her daughter's encouragement.  In the emergency department, she underwent a head CT and was told by the doctor that she had Bell's palsy. She was prescribed prednisone , which she completed, and started having improvement in facial weakness within a week.     Out-side paper records, electronic medical record, and images have been reviewed where available and summarized as:  CT head 07/25/2024:  Stable and  normal for age noncontrast CT appearance of the brain  Lab Results  Component Value Date   HGBA1C 6.1 12/31/2023   Lab Results  Component Value Date   VITAMINB12 356 07/28/2023   Lab Results  Component Value Date   TSH 1.11 01/05/2024   No results found for: ELIGIO SANDHOFF  Past Medical History:  Diagnosis Date   Anxiety    Arthritis    Bell's palsy 07/2024   Cancer (HCC) 2023   right breast   Cataract    CHF (congestive heart failure) (HCC)    Dysrhythmia    A fib   Heart murmur    HOH (hard of hearing)    Hyperlipidemia    Hypertension    PAF (paroxysmal atrial fibrillation) (HCC)    a. dx on 06/2015 admission. spontaneously converted into NSR. placed on Eliquis    Sleep apnea    CPAP    Past Surgical History:  Procedure Laterality Date   ABDOMINAL HYSTERECTOMY     ATRIAL FIBRILLATION ABLATION N/A 09/10/2022   Procedure: ATRIAL FIBRILLATION ABLATION;  Surgeon: Inocencio Soyla Lunger, MD;  Location: [facility] INVASIVE CV LAB;  Service: Cardiovascular;  Laterality: N/A;   BREAST BIOPSY Right 12/06/2021   BREAST CYST EXCISION Left    BREAST LUMPECTOMY Right 01/31/2022   BREAST LUMPECTOMY WITH RADIOACTIVE SEED LOCALIZATION Right 01/31/2022   Procedure: RIGHT BREAST LUMPECTOMY WITH RADIOACTIVE SEED LOCALIZATION;  Surgeon: Vanderbilt Ned, MD;  Location: [facility] OR;  Service: General;  Laterality: Right;   BREAST SURGERY Left    lumpectomy 35 yrs ago   CARDIOVERSION N/A 12/31/2021   Procedure: CARDIOVERSION;  Surgeon: Kate Lonni CROME, MD;  Location: [facility] ENDOSCOPY;  Service: Cardiovascular;  Laterality: N/A;   DILATION  AND CURETTAGE OF UTERUS     NECK SURGERY     discectomy with cadaver bone   SPINE SURGERY  Years ago   Neck surgery   TEE WITHOUT CARDIOVERSION N/A 12/31/2021   Procedure: TRANSESOPHAGEAL ECHOCARDIOGRAM (TEE);  Surgeon: Kate Lonni CROME, MD;  Location: [facility] ENDOSCOPY;  Service: Cardiovascular;  Laterality: N/A;   TONSILLECTOMY     removed  as a child   TUBAL LIGATION       Medications:  Outpatient Encounter Medications as of 09/06/2024  Medication Sig   acetaminophen  (TYLENOL ) 500 MG tablet Take 500 mg by mouth as needed for mild pain.   ALPRAZolam  (XANAX ) 0.25 MG tablet Take 1 tablet (0.25 mg total) by mouth at bedtime as needed for anxiety.   amLODipine  (NORVASC ) 5 MG tablet Take 1 tablet (5 mg total) by mouth daily.   apixaban  (ELIQUIS ) 5 MG TABS tablet Take 1 tablet (5 mg total) by mouth 2 (two) times daily.   Cholecalciferol (VITAMIN D ) 50 MCG (2000 UT) CAPS Take 2,000 Units by mouth daily.   diltiazem  (CARDIZEM ) 30 MG tablet Take 1 tablet every 4 hours AS NEEDED for AFIB heart rate >100   furosemide  (LASIX ) 20 MG tablet Take 1 tablet (20 mg total) by mouth daily. Pt will call when ready to have filled   Glucosamine 500 MG CAPS OTC as needed   Lactobacillus (PROBIOTIC ACIDOPHILUS PO) Take 1 tablet by mouth daily.   meclizine  (ANTIVERT ) 12.5 MG tablet Take 1 tablet (12.5 mg total) by mouth 3 (three) times daily as needed for dizziness.   metoprolol  succinate (TOPROL -XL) 25 MG 24 hr tablet Take 1 tablet (25 mg total) by mouth daily.   potassium chloride  (KLOR-CON ) 10 MEQ tablet Take 1 tablet (10 mEq total) by mouth 2 (two) times daily. Please keep scheduled appointment for future refills. Thank you.   Propylene Glycol 0.6 % SOLN Place 1 drop into both eyes at bedtime. systane eye drops   rosuvastatin  (CRESTOR ) 10 MG tablet Take 1 tablet (10 mg total) by mouth at bedtime.   sacubitril -valsartan  (ENTRESTO ) 49-51 MG Take 1 tablet by mouth 2 (two) times daily.   Hypromellose (VISTA GONIO DRY EYE RELIEF) 2.5 % SOLN Place 1 drop into the right eye 4 (four) times daily as needed. (Patient not taking: Reported on 09/06/2024)   No facility-administered encounter medications on file as of 09/06/2024.    Allergies:  Allergies  Allergen Reactions   Amlodipine  Besylate Other (See Comments)     Edema Taking 2.5 mg with no  problems   Buspirone     Other reaction(s): miserable   Fluoxetine     Other reaction(s): insomnia   Morphine And Codeine Nausea And Vomiting   Penicillins Nausea And Vomiting   Sertraline Hcl     Other reaction(s): nightmares, sexual disfunction    Family History: Family History  Problem Relation Age of Onset   Heart attack Mother 48   CVA Mother 55   Unexplained death Father 61   Hypertension Sister    Hypertension Brother    Colon cancer Maternal Grandfather    Prostate cancer Son 46   Breast cancer Neg Hx     Social History: Social History   Tobacco Use   Smoking status: Never   Smokeless tobacco: Never   Tobacco comments:    Never smoked 12/19/23  Vaping Use   Vaping status: Never Used  Substance Use Topics   Alcohol  use: No    Alcohol /week: 0.0 standard drinks of  alcohol    Drug use: No   Social History   Social History Narrative   Lives [place], near [place].    Daughter currently lives with her.      Are you right handed or left handed? Right Handed    Are you currently employed ? No    What is your current occupation?   Do you live at home alone? No    Who lives with you? Daughter    What type of home do you live in: 1 story or 2 story? Lives in a one story home        Vital Signs:  BP (!) 153/87   Pulse 73   Ht 5' 6 (1.676 m)   Wt 212 lb (96.2 kg)   SpO2 97%   BMI 34.22 kg/m    Neurological Exam: MENTAL STATUS including orientation to time, place, person, recent and remote memory, attention span and concentration, language, and fund of knowledge is normal.  Speech is not dysarthric.  CRANIAL NERVES: II:  No visual field defects.     III-IV-VI: Pupils equal round and reactive to light.  Normal conjugate, extra-ocular eye movements in all directions of gaze.  No nystagmus.  No ptosis.   V:  Normal facial sensation.    VII:  Mild flattening of the right nasolabial fold at baseline and with smile.  Frontalis, buccinator,  and oribicularis oculi and oris is 5/5.   VIII:  Normal hearing and vestibular function.   IX-X:  Normal palatal movement.   XI:  Normal shoulder shrug and head rotation.   XII:  Normal tongue strength and range of motion, no deviation or fasciculation.  MOTOR:  Motor strength is 5/5 throughout.  No atrophy, fasciculations or abnormal movements.  No pronator drift.   MSRs:                                           Right        Left brachioradialis 2+  2+  biceps 2+  2+  triceps 2+  2+  patellar 2+  2+  ankle jerk 2+  2+   SENSORY:  Normal and symmetric perception of light touch, temperature and vibration  COORDINATION/GAIT: Normal finger-to- nose-finger.  Intact rapid alternating movements bilaterally.  Gait narrow based and stable.   Thank you for allowing me to participate in patient's care.  If I can answer any additional questions, I would be pleased to do so.    Sincerely,    Ronnel Zuercher K. Tobie, DO

## 2024-09-23 ENCOUNTER — Ambulatory Visit: Admitting: Hematology

## 2024-09-23 ENCOUNTER — Other Ambulatory Visit

## 2024-09-24 ENCOUNTER — Other Ambulatory Visit: Payer: Self-pay

## 2024-09-24 DIAGNOSIS — C50411 Malignant neoplasm of upper-outer quadrant of right female breast: Secondary | ICD-10-CM

## 2024-09-26 NOTE — Assessment & Plan Note (Signed)
 invasive ductal carcinoma with lobular features, stage IA, pT1c, cN0, ER+/PR+/HER2-, Grade 2  -found on screening mammogram. S/p right lumpectomy on 01/31/22 with Dr. Vanderbilt, path showed 1.5 cm IDC with lobular features and DCIS, negative margins. -given her early stage disease and advanced age, we opted to forego adjuvant radiation.  -she started tamoxifen  in 02/2022, discontinued due to increased anxiety. Switched to anastrozole  06/2022, did not tolerate well and switched back to tamoxifen  at reduced dose 10 mg daily which she tolerats well.

## 2024-09-27 ENCOUNTER — Inpatient Hospital Stay: Admitting: Hematology

## 2024-09-27 ENCOUNTER — Inpatient Hospital Stay: Attending: Hematology

## 2024-09-27 VITALS — BP 140/75 | HR 62 | Temp 98.3°F | Resp 15 | Ht 66.0 in | Wt 214.3 lb

## 2024-09-27 DIAGNOSIS — Z170 Estrogen receptor positive status [ER+]: Secondary | ICD-10-CM | POA: Diagnosis not present

## 2024-09-27 DIAGNOSIS — Z853 Personal history of malignant neoplasm of breast: Secondary | ICD-10-CM | POA: Diagnosis present

## 2024-09-27 DIAGNOSIS — Z7901 Long term (current) use of anticoagulants: Secondary | ICD-10-CM | POA: Diagnosis not present

## 2024-09-27 DIAGNOSIS — C50411 Malignant neoplasm of upper-outer quadrant of right female breast: Secondary | ICD-10-CM | POA: Diagnosis not present

## 2024-09-27 LAB — CMP (CANCER CENTER ONLY)
ALT: 15 U/L (ref 0–44)
AST: 24 U/L (ref 15–41)
Albumin: 4.2 g/dL (ref 3.5–5.0)
Alkaline Phosphatase: 100 U/L (ref 38–126)
Anion gap: 9 (ref 5–15)
BUN: 16 mg/dL (ref 8–23)
CO2: 27 mmol/L (ref 22–32)
Calcium: 9.5 mg/dL (ref 8.9–10.3)
Chloride: 107 mmol/L (ref 98–111)
Creatinine: 0.73 mg/dL (ref 0.44–1.00)
GFR, Estimated: >60 mL/min (ref >60)
Glucose, Bld: 109 mg/dL — ABNORMAL HIGH (ref 70–99)
Potassium: 4.7 mmol/L (ref 3.5–5.1)
Sodium: 143 mmol/L (ref 135–145)
Total Bilirubin: 0.7 mg/dL (ref 0.0–1.2)
Total Protein: 6.3 g/dL — ABNORMAL LOW (ref 6.5–8.1)

## 2024-09-27 LAB — CBC WITH DIFFERENTIAL (CANCER CENTER ONLY)
Abs Immature Granulocytes: 0.02 K/uL (ref 0.00–0.07)
Basophils Absolute: 0.1 K/uL (ref 0.0–0.1)
Basophils Relative: 1 %
Eosinophils Absolute: 0.3 K/uL (ref 0.0–0.5)
Eosinophils Relative: 4 %
HCT: 40.5 % (ref 36.0–46.0)
Hemoglobin: 13.5 g/dL (ref 12.0–15.0)
Immature Granulocytes: 0 %
Lymphocytes Relative: 32 %
Lymphs Abs: 2 K/uL (ref 0.7–4.0)
MCH: 29.8 pg (ref 26.0–34.0)
MCHC: 33.3 g/dL (ref 30.0–36.0)
MCV: 89.4 fL (ref 80.0–100.0)
Monocytes Absolute: 0.9 K/uL (ref 0.1–1.0)
Monocytes Relative: 14 %
Neutro Abs: 3.2 K/uL (ref 1.7–7.7)
Neutrophils Relative %: 49 %
Platelet Count: 212 K/uL (ref 150–400)
RBC: 4.53 MIL/uL (ref 3.87–5.11)
RDW: 13.8 % (ref 11.5–15.5)
WBC Count: 6.4 K/uL (ref 4.0–10.5)
nRBC: 0 % (ref 0.0–0.2)

## 2024-09-27 NOTE — Progress Notes (Signed)
 [facility]   [phone] [phone]   Clinic Follow up Note   Patient Care Team: Amon Aloysius BRAVO, MD as PCP - General (Internal Medicine) Jeffrie Oneil BROCKS, MD as PCP - Cardiology (Cardiology) Inocencio Soyla Lunger, MD as PCP - Electrophysiology (Cardiology) Vanderbilt Ned, MD as Consulting Physician (General Surgery) Lanny Callander, MD as Consulting Physician (Hematology) Dewey Rush, MD as Consulting Physician (Radiation Oncology) Saintclair Jasper, MD as Consulting Physician (Gastroenterology) Leslee Reusing, MD as Consulting Physician (Ophthalmology) Tobie Tonita POUR, DO as Consulting Physician (Neurology)  Date of Service:  09/27/2024  CHIEF COMPLAINT: f/u of right breast cancer  CURRENT THERAPY:  Surveillance  Oncology History   Malignant neoplasm of upper-outer quadrant of right breast in female, estrogen receptor positive (HCC) invasive ductal carcinoma with lobular features, stage IA, pT1c, cN0, ER+/PR+/HER2-, Grade 2  -found on screening mammogram. S/p right lumpectomy on 01/31/22 with Dr. Vanderbilt, path showed 1.5 cm IDC with lobular features and DCIS, negative margins. -given her early stage disease and advanced age, we opted to forego adjuvant radiation.  -she started tamoxifen  in 02/2022, discontinued due to increased anxiety. Switched to anastrozole  06/2022, did not tolerate well and switched back to tamoxifen  at reduced dose 10 mg daily and she stopped in March 2025 due to hot flushes.  -continue cancer surveillance   Assessment & Plan Estrogen receptor positive right breast cancer, upper-outer quadrant Estrogen receptor positive right breast cancer, status post surgery April 2023. She discontinued tamoxifen  and anastrozole  due to severe vasomotor symptoms and has been off endocrine therapy since March 2025. She remains asymptomatic with no palpable masses or new breast symptoms. At her current age, recurrence risk is low. Bone density is normal. Low  breast density supports transition to routine screening mammography after the next diagnostic study. - Reviewed breast cancer history and prior intolerance to endocrine therapy due to severe facial flushing. - Assessed for recurrence; she denied new breast symptoms or palpable masses. - Confirmed diagnostic mammogram scheduled for January 2026. - Recommended transition to routine screening mammograms after the next diagnostic mammogram, given low breast density and low recurrence risk. - Planned annual oncology follow-up. - Coordinated care with primary care provider, who is following her every six months.  Plan - She is clinically doing well, lab reviewed, exam was unremarkable, no clinical concern for recurrence - Continue breast cancer surveillance. - she has stoppped tamoxifen  due to poor tolerance.   SUMMARY OF ONCOLOGIC HISTORY: Oncology History Overview Note   Cancer Staging  Malignant neoplasm of upper-outer quadrant of right breast in female, estrogen receptor positive (HCC) Staging form: Breast, AJCC 8th Edition - Clinical stage from 12/06/2021: Stage IA (cT1b, cN0, cM0, G2, ER+, PR+, HER2-) - Signed by Lanny Callander, MD on 12/11/2021 - Pathologic stage from 01/31/2022: Stage Unknown (pT1c, pNX, cM0, G2, ER+, PR+, HER2-) - Signed by Lanny Callander, MD on 02/12/2022     Malignant neoplasm of upper-outer quadrant of right breast in female, estrogen receptor positive (HCC)  11/28/2021 Mammogram   CLINICAL DATA:  Patient returns today to evaluate possible RIGHT breast asymmetries identified on recent screening mammogram.   EXAM: DIGITAL DIAGNOSTIC UNILATERAL RIGHT MAMMOGRAM WITH TOMOSYNTHESIS AND CAD; ULTRASOUND RIGHT BREAST LIMITED  IMPRESSION: 1. Irregular hypoechoic mass in the RIGHT breast at the 10 o'clock axis, 7 cm from the nipple, measuring 9 mm, with associated architectural distortion, corresponding to the mammographic finding. This is a highly suspicious finding for which  ultrasound-guided core biopsy is recommended. 2. Additional benign  findings within the RIGHT breast, as detailed above. 3. No enlarged or morphologically abnormal lymph nodes in the RIGHT axilla.   12/06/2021 Cancer Staging   Staging form: Breast, AJCC 8th Edition - Clinical stage from 12/06/2021: Stage IA (cT1b, cN0, cM0, G2, ER+, PR+, HER2-) - Signed by Lanny Callander, MD on 12/11/2021 Stage prefix: Initial diagnosis Histologic grading system: 3 grade system   12/06/2021 Initial Biopsy   Diagnosis Breast, right, needle core biopsy, 10 o'clock, 7cmfn INVASIVE DUCTAL CARCINOMA WITH LOBULAR FEATURES, GRADE 2 (3+2+1)  An E-cadherin immunohistochemical stains performed with adequate control. This stain is diffusely negative within the tumor. Histologically the tumor forms ducts and is present in cribriform sheets as well as focally present in cords and small solid nests. Given this histology it is felt the negative E-cadherin stain represents aberrant absence of expression within an otherwise ductal carcinoma. This lack of E-cadherin expression may explain the focal lobular features.  PROGNOSTIC INDICATORS Results: The tumor cells are EQUIVOCAL for Her2 (2+). Her2 by FISH will be performed and the results reported separately. Estrogen Receptor: 95%, POSITIVE, STRONG STAINING INTENSITY Progesterone Receptor: 80%, POSITIVE, STRONG STAINING INTENSITY Proliferation Marker Ki67: 2%   12/10/2021 Initial Diagnosis   Malignant neoplasm of upper-outer quadrant of right breast in female, estrogen receptor positive (HCC)    Genetic Testing   UPDATE: the BRCA2 VUS has been reclassified to Likely Benign. Report date is 08/23/2024  [facility] CancerNext-Expanded Panel was Negative. Of note, a variant of uncertain significance was detected in the BRCA2 gene (p.[id number]). Report date is 12/24/2021.  The CancerNext-Expanded gene panel offered by [facility] and includes sequencing, rearrangement, and RNA analysis for  the following 77 genes: AIP, ALK, APC, ATM, AXIN2, BAP1, BARD1, BLM, BMPR1A, BRCA1, BRCA2, BRIP1, CDC73, CDH1, CDK4, CDKN1B, CDKN2A, CHEK2, CTNNA1, DICER1, FANCC, FH, FLCN, GALNT12, KIF1B, LZTR1, MAX, MEN1, MET, MLH1, MSH2, MSH3, MSH6, MUTYH, NBN, NF1, NF2, NTHL1, PALB2, PHOX2B, PMS2, POT1, PRKAR1A, PTCH1, PTEN, RAD51C, RAD51D, RB1, RECQL, RET, SDHA, SDHAF2, SDHB, SDHC, SDHD, SMAD4, SMARCA4, SMARCB1, SMARCE1, STK11, SUFU, TMEM127, TP53, TSC1, TSC2, VHL and XRCC2 (sequencing and deletion/duplication); EGFR, EGLN1, HOXB13, KIT, MITF, PDGFRA, POLD1, and POLE (sequencing only); EPCAM and GREM1 (deletion/duplication only).    01/31/2022 Cancer Staging   Staging form: Breast, AJCC 8th Edition - Pathologic stage from 01/31/2022: Stage Unknown (pT1c, pNX, cM0, G2, ER+, PR+, HER2-) - Signed by Lanny Callander, MD on 02/12/2022 Stage prefix: Initial diagnosis Histologic grading system: 3 grade system Residual tumor (R): R0 - None   01/31/2022 Definitive Surgery   FINAL MICROSCOPIC DIAGNOSIS:   A. BREAST, RIGHT, LUMPECTOMY:  -  Invasive ductal carcinoma with lobular features, Nottingham grade 2 of 3, 1.5 cm  -  Ductal carcinoma in-situ, intermediate grade  -  Margins uninvolved by carcinoma (<0.1 cm; anterior margin)  -  Previous biopsy site changes present  -  Adenosis and fibrocystic changes  -  See oncology table and comment below   B. BREAST, RIGHT ADDITIONAL ANTERIOMEDIAL MARGIN, EXCISION:  -  Usual ductal hyperplasia  -  No residual carcinoma identified  -  See comment   C. BREAST, RIGHT ADDITIONAL ANTERIOPOSTERIOR MARGIN, EXCISION:  -  No residual carcinoma identified   D. BREAST, RIGHT ADDITIONAL LATEROINFERIOR MARGIN, EXCISION:  -  Focal residual ductal carcinoma in situ  -  Fibrocystic changes with metaplasia  -  Margins uninvolved by carcinoma (0.1 cm; inferior margin)  -  See comment    03/2022 -  Anti-estrogen oral therapy   Tamoxifen  x  5 years      Discussed the use of AI scribe  software for clinical note transcription with the patient, who gave verbal consent to proceed.  History of Present Illness Shannon Liu is an 83 year old female with estrogen receptor positive right breast cancer, status post surgical resection, presenting for routine oncology follow-up.  She was diagnosed with ER+ right breast cancer in April 2023 and underwent right breast surgical resection. She has no new palpable masses, lumps, or changes on self-exam and has occasional mild pain at the surgical site.  She previously received tamoxifen  and anastrozole  for endocrine therapy, both stopped by March 2025 due to intolerable facial flushing, which resolved after stopping. She has not resumed endocrine therapy and denies mood changes.  She had a prior period of decreased appetite with weight loss, but now has increased appetite and would like to lose about twenty pounds. She denies abdominal pain or swelling.  She has completed two diagnostic mammograms since diagnosis, with a third scheduled for January 2026. Bone density scan in 2023 was normal. She had benign left breast surgery about sixty years ago without current issues. Dermatology has evaluated her skin lesions, including a mole, which are stable.     All other systems were reviewed with the patient and are negative.  MEDICAL HISTORY:  Past Medical History:  Diagnosis Date   Anxiety    Arthritis    Bell's palsy 07/2024   Cancer [facility]) 2023   right breast   Cataract    CHF (congestive heart failure) (HCC)    Dysrhythmia    A fib   Heart murmur    HOH (hard of hearing)    Hyperlipidemia    Hypertension    PAF (paroxysmal atrial fibrillation) (HCC)    a. dx on 06/2015 admission. spontaneously converted into NSR. placed on Eliquis    Sleep apnea    CPAP    SURGICAL HISTORY: Past Surgical History:  Procedure Laterality Date   ABDOMINAL HYSTERECTOMY     ATRIAL FIBRILLATION ABLATION N/A 09/10/2022   Procedure: ATRIAL  FIBRILLATION ABLATION;  Surgeon: Inocencio Soyla Lunger, MD;  Location: [facility] INVASIVE CV LAB;  Service: Cardiovascular;  Laterality: N/A;   BREAST BIOPSY Right 12/06/2021   BREAST CYST EXCISION Left    BREAST LUMPECTOMY Right 01/31/2022   BREAST LUMPECTOMY WITH RADIOACTIVE SEED LOCALIZATION Right 01/31/2022   Procedure: RIGHT BREAST LUMPECTOMY WITH RADIOACTIVE SEED LOCALIZATION;  Surgeon: Vanderbilt Ned, MD;  Location: [facility] OR;  Service: General;  Laterality: Right;   BREAST SURGERY Left    lumpectomy 35 yrs ago   CARDIOVERSION N/A 12/31/2021   Procedure: CARDIOVERSION;  Surgeon: Kate Lonni CROME, MD;  Location: [facility] ENDOSCOPY;  Service: Cardiovascular;  Laterality: N/A;   DILATION AND CURETTAGE OF UTERUS     NECK SURGERY     discectomy with cadaver bone   SPINE SURGERY  Years ago   Neck surgery   TEE WITHOUT CARDIOVERSION N/A 12/31/2021   Procedure: TRANSESOPHAGEAL ECHOCARDIOGRAM (TEE);  Surgeon: Kate Lonni CROME, MD;  Location: [facility] ENDOSCOPY;  Service: Cardiovascular;  Laterality: N/A;   TONSILLECTOMY     removed as a child   TUBAL LIGATION      I have reviewed the social history and family history with the patient and they are unchanged from previous note.  ALLERGIES:  is allergic to amlodipine  besylate, buspirone, fluoxetine, morphine and codeine, penicillins, and sertraline hcl.  MEDICATIONS:  Current Outpatient Medications  Medication Sig Dispense Refill   acetaminophen  (TYLENOL ) 500 MG tablet  Take 500 mg by mouth as needed for mild pain.     ALPRAZolam  (XANAX ) 0.25 MG tablet Take 1 tablet (0.25 mg total) by mouth at bedtime as needed for anxiety. 30 tablet 2   amLODipine  (NORVASC ) 5 MG tablet Take 1 tablet (5 mg total) by mouth daily. 90 tablet 1   apixaban  (ELIQUIS ) 5 MG TABS tablet Take 1 tablet (5 mg total) by mouth 2 (two) times daily. 180 tablet 1   Cholecalciferol (VITAMIN D ) 50 MCG (2000 UT) CAPS Take 2,000 Units by mouth daily.     diltiazem  (CARDIZEM ) 30 MG  tablet Take 1 tablet every 4 hours AS NEEDED for AFIB heart rate >100 45 tablet 0   furosemide  (LASIX ) 20 MG tablet Take 1 tablet (20 mg total) by mouth daily. Pt will call when ready to have filled 90 tablet 3   Glucosamine 500 MG CAPS OTC as needed     Hypromellose ([place] DRY EYE RELIEF) 2.5 % SOLN Place 1 drop into the right eye 4 (four) times daily as needed. 15 mL 12   Lactobacillus (PROBIOTIC ACIDOPHILUS PO) Take 1 tablet by mouth daily.     meclizine  (ANTIVERT ) 12.5 MG tablet Take 1 tablet (12.5 mg total) by mouth 3 (three) times daily as needed for dizziness. 15 tablet 0   metoprolol  succinate (TOPROL -XL) 25 MG 24 hr tablet Take 1 tablet (25 mg total) by mouth daily. 90 tablet 1   potassium chloride  (KLOR-CON ) 10 MEQ tablet Take 1 tablet (10 mEq total) by mouth 2 (two) times daily. Please keep scheduled appointment for future refills. Thank you. 180 tablet 3   Propylene Glycol 0.6 % SOLN Place 1 drop into both eyes at bedtime. systane eye drops     rosuvastatin  (CRESTOR ) 10 MG tablet Take 1 tablet (10 mg total) by mouth at bedtime. 90 tablet 1   sacubitril -valsartan  (ENTRESTO ) 49-51 MG Take 1 tablet by mouth 2 (two) times daily. 180 tablet 3   No current facility-administered medications for this visit.    PHYSICAL EXAMINATION: ECOG PERFORMANCE STATUS: 0 - Asymptomatic  Vitals:   09/27/24 0959 09/27/24 1003  BP: (!) 151/78 (!) 140/75  Pulse: 65 62  Resp: 15   Temp: 98.3 F (36.8 C)   SpO2: 99% 97%   Wt Readings from Last 3 Encounters:  09/27/24 214 lb 4.8 oz (97.2 kg)  09/06/24 212 lb (96.2 kg)  08/31/24 206 lb (93.4 kg)     GENERAL:alert, no distress and comfortable SKIN: skin color, texture, turgor are normal, no rashes or significant lesions EYES: normal, Conjunctiva are pink and non-injected, sclera clear NECK: supple, thyroid  normal size, non-tender, without nodularity LYMPH:  no palpable lymphadenopathy in the cervical, axillary  LUNGS: clear to auscultation  and percussion with normal breathing effort HEART: regular rate & rhythm and no murmurs and no lower extremity edema ABDOMEN:abdomen soft, non-tender and normal bowel sounds Musculoskeletal:no cyanosis of digits and no clubbing  NEURO: alert & oriented x 3 with fluent speech, no focal motor/sensory deficits BREAST: Scar tissue present in right breast, non-tender.  No palpable breast mass or adenopathy. Physical Exam   LABORATORY DATA:  I have reviewed the data as listed    Latest Ref Rng & Units 09/27/2024    9:46 AM 07/25/2024   11:16 AM 07/14/2024   11:44 AM  CBC  WBC 4.0 - 10.5 K/uL 6.4  5.8  6.1   Hemoglobin 12.0 - 15.0 g/dL 86.4  85.4  85.9   Hematocrit 36.0 -  46.0 % 40.5  43.5  42.7   Platelets 150 - 400 K/uL 212  222  211.0         Latest Ref Rng & Units 09/27/2024    9:46 AM 07/25/2024   11:16 AM 07/14/2024   11:44 AM  CMP  Glucose 70 - 99 mg/dL 890  854  892   BUN 8 - 23 mg/dL 16  15  15    Creatinine 0.44 - 1.00 mg/dL 9.26  9.26  9.32   Sodium 135 - 145 mmol/L 143  142  142   Potassium 3.5 - 5.1 mmol/L 4.7  3.7  4.3   Chloride 98 - 111 mmol/L 107  102  103   CO2 22 - 32 mmol/L 27  22  30    Calcium  8.9 - 10.3 mg/dL 9.5  9.2  9.6   Total Protein 6.5 - 8.1 g/dL 6.3  6.4    Total Bilirubin 0.0 - 1.2 mg/dL 0.7  1.1    Alkaline Phos 38 - 126 U/L 100  93    AST 15 - 41 U/L 24  24    ALT 0 - 44 U/L 15  14        RADIOGRAPHIC STUDIES: I have personally reviewed the radiological images as listed and agreed with the findings in the report. No results found.    No orders of the defined types were placed in this encounter.  All questions were answered. The patient knows to call the clinic with any problems, questions or concerns. No barriers to learning was detected. The total time spent in the appointment was 20 minutes, including review of chart and various tests results, discussions about plan of care and coordination of care plan     Onita Mattock, MD 09/27/2024

## 2024-09-29 ENCOUNTER — Telehealth: Payer: Self-pay

## 2024-09-29 NOTE — Telephone Encounter (Signed)
 Pt needing new CPAP machine, received order from [employer]. Form stamped and faxed back to [phone]. Form sent for scanning.

## 2024-10-18 ENCOUNTER — Other Ambulatory Visit: Payer: Self-pay

## 2024-10-18 DIAGNOSIS — G4733 Obstructive sleep apnea (adult) (pediatric): Secondary | ICD-10-CM

## 2024-11-11 ENCOUNTER — Ambulatory Visit
Admission: RE | Admit: 2024-11-11 | Discharge: 2024-11-11 | Disposition: A | Discharge status: Home / Self Care | Source: Ambulatory Visit | Attending: Internal Medicine | Admitting: Internal Medicine

## 2024-11-11 DIAGNOSIS — Z1231 Encounter for screening mammogram for malignant neoplasm of breast: Secondary | ICD-10-CM

## 2024-11-22 ENCOUNTER — Ambulatory Visit: Admitting: Adult Health

## 2024-12-20 ENCOUNTER — Ambulatory Visit (HOSPITAL_COMMUNITY): Admitting: Physician Assistant

## 2025-01-19 ENCOUNTER — Encounter: Admitting: Internal Medicine

## 2025-09-01 ENCOUNTER — Ambulatory Visit

## 2025-10-03 ENCOUNTER — Inpatient Hospital Stay

## 2025-10-03 ENCOUNTER — Inpatient Hospital Stay: Admitting: Adult Health
